# Patient Record
Sex: Male | Born: 1950 | Race: White | Hispanic: No | Marital: Married | Smoking: Former smoker
Health system: Southern US, Community
[De-identification: ages and names within clinical notes are randomized; demographics above are authoritative.]

## PROBLEM LIST (undated history)

## (undated) DIAGNOSIS — J189 Pneumonia, unspecified organism: Secondary | ICD-10-CM

## (undated) DIAGNOSIS — Z9981 Dependence on supplemental oxygen: Secondary | ICD-10-CM

## (undated) DIAGNOSIS — M1990 Unspecified osteoarthritis, unspecified site: Secondary | ICD-10-CM

## (undated) DIAGNOSIS — J45909 Unspecified asthma, uncomplicated: Secondary | ICD-10-CM

## (undated) DIAGNOSIS — B1920 Unspecified viral hepatitis C without hepatic coma: Secondary | ICD-10-CM

## (undated) DIAGNOSIS — E119 Type 2 diabetes mellitus without complications: Secondary | ICD-10-CM

## (undated) DIAGNOSIS — J449 Chronic obstructive pulmonary disease, unspecified: Secondary | ICD-10-CM

## (undated) DIAGNOSIS — F1010 Alcohol abuse, uncomplicated: Secondary | ICD-10-CM

## (undated) DIAGNOSIS — L299 Pruritus, unspecified: Secondary | ICD-10-CM

## (undated) DIAGNOSIS — C801 Malignant (primary) neoplasm, unspecified: Secondary | ICD-10-CM

## (undated) DIAGNOSIS — F32A Depression, unspecified: Secondary | ICD-10-CM

## (undated) DIAGNOSIS — C159 Malignant neoplasm of esophagus, unspecified: Secondary | ICD-10-CM

## (undated) DIAGNOSIS — E039 Hypothyroidism, unspecified: Secondary | ICD-10-CM

## (undated) DIAGNOSIS — R918 Other nonspecific abnormal finding of lung field: Secondary | ICD-10-CM

## (undated) DIAGNOSIS — F329 Major depressive disorder, single episode, unspecified: Secondary | ICD-10-CM

## (undated) HISTORY — DX: Unspecified viral hepatitis C without hepatic coma: B19.20

---

## 1988-03-10 HISTORY — PX: TUMOR EXCISION: SHX421

## 1993-03-10 HISTORY — PX: OTHER SURGICAL HISTORY: SHX169

## 2000-03-24 ENCOUNTER — Emergency Department (HOSPITAL_COMMUNITY): Admission: EM | Admit: 2000-03-24 | Discharge: 2000-03-25 | Payer: Self-pay | Admitting: Emergency Medicine

## 2014-03-10 HISTORY — PX: DENTAL SURGERY: SHX609

## 2014-08-23 ENCOUNTER — Encounter (HOSPITAL_COMMUNITY): Payer: Self-pay | Admitting: Emergency Medicine

## 2014-08-23 ENCOUNTER — Emergency Department (HOSPITAL_COMMUNITY): Payer: Medicaid Other

## 2014-08-23 ENCOUNTER — Observation Stay (HOSPITAL_COMMUNITY)
Admission: EM | Admit: 2014-08-23 | Discharge: 2014-08-24 | Disposition: A | Discharge status: Home / Self Care | Payer: Medicaid Other | Attending: Internal Medicine | Admitting: Internal Medicine

## 2014-08-23 DIAGNOSIS — R7401 Elevation of levels of liver transaminase levels: Secondary | ICD-10-CM | POA: Diagnosis present

## 2014-08-23 DIAGNOSIS — Z7951 Long term (current) use of inhaled steroids: Secondary | ICD-10-CM | POA: Insufficient documentation

## 2014-08-23 DIAGNOSIS — Z87891 Personal history of nicotine dependence: Secondary | ICD-10-CM | POA: Insufficient documentation

## 2014-08-23 DIAGNOSIS — Z7982 Long term (current) use of aspirin: Secondary | ICD-10-CM | POA: Diagnosis not present

## 2014-08-23 DIAGNOSIS — F329 Major depressive disorder, single episode, unspecified: Secondary | ICD-10-CM | POA: Diagnosis present

## 2014-08-23 DIAGNOSIS — J449 Chronic obstructive pulmonary disease, unspecified: Secondary | ICD-10-CM | POA: Diagnosis present

## 2014-08-23 DIAGNOSIS — Z79899 Other long term (current) drug therapy: Secondary | ICD-10-CM | POA: Insufficient documentation

## 2014-08-23 DIAGNOSIS — R079 Chest pain, unspecified: Secondary | ICD-10-CM | POA: Diagnosis not present

## 2014-08-23 DIAGNOSIS — F32A Depression, unspecified: Secondary | ICD-10-CM | POA: Diagnosis present

## 2014-08-23 DIAGNOSIS — R740 Nonspecific elevation of levels of transaminase and lactic acid dehydrogenase [LDH]: Secondary | ICD-10-CM

## 2014-08-23 HISTORY — DX: Alcohol abuse, uncomplicated: F10.10

## 2014-08-23 HISTORY — DX: Major depressive disorder, single episode, unspecified: F32.9

## 2014-08-23 HISTORY — DX: Unspecified osteoarthritis, unspecified site: M19.90

## 2014-08-23 HISTORY — DX: Depression, unspecified: F32.A

## 2014-08-23 HISTORY — DX: Chronic obstructive pulmonary disease, unspecified: J44.9

## 2014-08-23 LAB — CBC WITH DIFFERENTIAL/PLATELET
BASOS ABS: 0 10*3/uL (ref 0.0–0.1)
BASOS PCT: 0 % (ref 0–1)
Eosinophils Absolute: 0.4 10*3/uL (ref 0.0–0.7)
Eosinophils Relative: 6 % — ABNORMAL HIGH (ref 0–5)
HEMATOCRIT: 41.9 % (ref 39.0–52.0)
Hemoglobin: 14.1 g/dL (ref 13.0–17.0)
LYMPHS PCT: 49 % — AB (ref 12–46)
Lymphs Abs: 3.2 10*3/uL (ref 0.7–4.0)
MCH: 31.4 pg (ref 26.0–34.0)
MCHC: 33.7 g/dL (ref 30.0–36.0)
MCV: 93.3 fL (ref 78.0–100.0)
Monocytes Absolute: 0.7 10*3/uL (ref 0.1–1.0)
Monocytes Relative: 11 % (ref 3–12)
NEUTROS ABS: 2.2 10*3/uL (ref 1.7–7.7)
Neutrophils Relative %: 33 % — ABNORMAL LOW (ref 43–77)
Platelets: 177 10*3/uL (ref 150–400)
RBC: 4.49 MIL/uL (ref 4.22–5.81)
RDW: 13.1 % (ref 11.5–15.5)
WBC: 6.5 10*3/uL (ref 4.0–10.5)

## 2014-08-23 LAB — COMPREHENSIVE METABOLIC PANEL
ALK PHOS: 56 U/L (ref 38–126)
ALT: 69 U/L — ABNORMAL HIGH (ref 17–63)
ANION GAP: 6 (ref 5–15)
AST: 62 U/L — ABNORMAL HIGH (ref 15–41)
Albumin: 4.3 g/dL (ref 3.5–5.0)
BILIRUBIN TOTAL: 0.5 mg/dL (ref 0.3–1.2)
BUN: 11 mg/dL (ref 6–20)
CHLORIDE: 103 mmol/L (ref 101–111)
CO2: 29 mmol/L (ref 22–32)
Calcium: 9 mg/dL (ref 8.9–10.3)
Creatinine, Ser: 0.91 mg/dL (ref 0.61–1.24)
GFR calc non Af Amer: >60 mL/min (ref >60)
GLUCOSE: 97 mg/dL (ref 65–99)
POTASSIUM: 4.2 mmol/L (ref 3.5–5.1)
Sodium: 138 mmol/L (ref 135–145)
Total Protein: 7.7 g/dL (ref 6.5–8.1)

## 2014-08-23 LAB — TROPONIN I
Troponin I: <0.03 ng/mL (ref <0.031)
Troponin I: <0.03 ng/mL (ref <0.031)

## 2014-08-23 LAB — URINALYSIS, ROUTINE W REFLEX MICROSCOPIC
BILIRUBIN URINE: NEGATIVE
Glucose, UA: NEGATIVE mg/dL
Hgb urine dipstick: NEGATIVE
KETONES UR: NEGATIVE mg/dL
Leukocytes, UA: NEGATIVE
NITRITE: NEGATIVE
Protein, ur: NEGATIVE mg/dL
SPECIFIC GRAVITY, URINE: 1.02 (ref 1.005–1.030)
UROBILINOGEN UA: 0.2 mg/dL (ref 0.0–1.0)
pH: 5.5 (ref 5.0–8.0)

## 2014-08-23 LAB — ETHANOL

## 2014-08-23 LAB — RAPID URINE DRUG SCREEN, HOSP PERFORMED
Amphetamines: NOT DETECTED
BARBITURATES: NOT DETECTED
Benzodiazepines: POSITIVE — AB
COCAINE: NOT DETECTED
OPIATES: NOT DETECTED
TETRAHYDROCANNABINOL: NOT DETECTED

## 2014-08-23 LAB — ACETAMINOPHEN LEVEL

## 2014-08-23 MED ORDER — HEPARIN SODIUM (PORCINE) 5000 UNIT/ML IJ SOLN
5000.0000 [IU] | Freq: Three times a day (TID) | INTRAMUSCULAR | Status: DC
  Administered 2014-08-23 – 2014-08-24 (×3): 5000 [IU] via SUBCUTANEOUS
  Filled 2014-08-23 (×3): qty 1

## 2014-08-23 MED ORDER — ONDANSETRON HCL 4 MG/2ML IJ SOLN: 4.0000 mg | Freq: Four times a day (QID) | INTRAMUSCULAR | Status: DC | PRN

## 2014-08-23 MED ORDER — ACETAMINOPHEN 325 MG PO TABS: 650.0000 mg | ORAL_TABLET | ORAL | Status: DC | PRN

## 2014-08-23 NOTE — ED Notes (Addendum)
PT c/o sharp central chest pain this morning upon walking with numbness/tingling to left arm. PT also c/o generalized weakness and SOB today. PT reports taking a 325mg  aspirin prior to ED arrival and denies any CP at this time.

## 2014-08-23 NOTE — H&P (Signed)
[facility]          History and Physical    PCP:   No primary care provider on file.   EDP: Brent Franco, EDP  Chief Complaint:  Chest pain  HPI: Patient is a 64 year old man without significant past medical history but also does not have regular medical follow-up who presents to the hospital today with chest pain. He states that early this morning he went to take his trash cans out to the driveway and while walking there are experienced sharp substernal chest pain radiating up into his neck and causing some numbness of his left arm. He didn't think much about this, however when he went to have a shower he felt nauseous and at that moment decided to call 911. Chest pain fully resolved after aspirin given in the emergency department. Has no family history of heart disease and does not a smoker. Initial troponin in the ED is negative and EKG as reviewed by myself shows no acute ischemic abnormalities.  Allergies:  No Known Allergies   History reviewed. No pertinent past medical history.  History reviewed. No pertinent past surgical history.  Prior to Admission medications   Medication Sig Start Date End Date Taking? Authorizing Provider  aspirin 325 MG tablet Take 325 mg by mouth daily.   Yes Historical Provider, MD  mometasone (NASONEX) 50 MCG/ACT nasal spray Place 2 sprays into the nose daily.   Yes Historical Provider, MD  Pseudoeph-Doxylamine-DM-APAP (NYQUIL MULTI-SYMPTOM PO) Take 1 tablet by mouth daily.   Yes Historical Provider, MD    Social History:  reports that he quit smoking about 8 months ago. He does not have any smokeless tobacco history on file. He reports that he does not drink alcohol or use illicit drugs.  History reviewed. No pertinent family history. specifically no family history of heart disease, stroke, diabetes, hypertension, hyperlipidemia  Review of Systems:  Constitutional: Denies fever, chills, diaphoresis, appetite change and  fatigue.  HEENT: Denies photophobia, eye pain, redness, hearing loss, ear pain, congestion, sore throat, rhinorrhea, sneezing, mouth sores, trouble swallowing, neck pain, neck stiffness and tinnitus.   Respiratory: Denies SOB, DOE, cough, chest tightness,  and wheezing.   Cardiovascular: Denies chest pain, palpitations and leg swelling.  Gastrointestinal: Denies nausea, vomiting, abdominal pain, diarrhea, constipation, blood in stool and abdominal distention.  Genitourinary: Denies dysuria, urgency, frequency, hematuria, flank pain and difficulty urinating.  Endocrine: Denies: hot or cold intolerance, sweats, changes in hair or nails, polyuria, polydipsia. Musculoskeletal: Denies myalgias, back pain, joint swelling, arthralgias and gait problem.  Skin: Denies pallor, rash and wound.  Neurological: Denies dizziness, seizures, syncope, weakness, light-headedness, numbness and headaches.  Hematological: Denies adenopathy. Easy bruising, personal or family bleeding history  Psychiatric/Behavioral: Denies suicidal ideation, mood changes, confusion, nervousness, sleep disturbance and agitation   Physical Exam: Blood pressure 144/84, pulse 62, temperature 98.2 F (36.8 C), temperature source Oral, resp. rate 18, height 6' (1.829 m), weight 86.274 kg (190 lb 3.2 oz), SpO2 97 %. General: Alert, awake, oriented 3, no current distress HEENT: Normocephalic, atraumatic, pupils equal round and reactive to light, extraocular movements intact, moist mucous membranes, wears corrective lenses Neck: Supple, no JVD, no lymphadenopathy, no bruits, no goiter. Cardiovascular: Regular rate and rhythm, no murmurs, rubs or gallops. Lungs: Clear to rotation bilaterally. Abdomen: Soft, nontender, nondistended, positive bowel sounds, no masses or organomegaly noted. Consent extremities: No clubbing, cyanosis or edema, positive pulses. Neurologic:  Grossly intact and nonfocal  Labs on Admission:  Results for orders  placed or performed during the hospital encounter of 08/23/14 (from the past 48 hour(s))  CBC with Differential     Status: Abnormal   Collection Time: 08/23/14 10:40 AM  Result Value Ref Range   WBC 6.5 4.0 - 10.5 K/uL   RBC 4.49 4.22 - 5.81 MIL/uL   Hemoglobin 14.1 13.0 - 17.0 g/dL   HCT 41.9 39.0 - 52.0 %   MCV 93.3 78.0 - 100.0 fL   MCH 31.4 26.0 - 34.0 pg   MCHC 33.7 30.0 - 36.0 g/dL   RDW 13.1 11.5 - 15.5 %   Platelets 177 150 - 400 K/uL   Neutrophils Relative % 33 (L) 43 - 77 %   Neutro Abs 2.2 1.7 - 7.7 K/uL   Lymphocytes Relative 49 (H) 12 - 46 %   Lymphs Abs 3.2 0.7 - 4.0 K/uL   Monocytes Relative 11 3 - 12 %   Monocytes Absolute 0.7 0.1 - 1.0 K/uL   Eosinophils Relative 6 (H) 0 - 5 %   Eosinophils Absolute 0.4 0.0 - 0.7 K/uL   Basophils Relative 0 0 - 1 %   Basophils Absolute 0.0 0.0 - 0.1 K/uL  Comprehensive metabolic panel     Status: Abnormal   Collection Time: 08/23/14 10:40 AM  Result Value Ref Range   Sodium 138 135 - 145 mmol/L   Potassium 4.2 3.5 - 5.1 mmol/L   Chloride 103 101 - 111 mmol/L   CO2 29 22 - 32 mmol/L   Glucose, Bld 97 65 - 99 mg/dL   BUN 11 6 - 20 mg/dL   Creatinine, Ser 0.91 0.61 - 1.24 mg/dL   Calcium 9.0 8.9 - 10.3 mg/dL   Total Protein 7.7 6.5 - 8.1 g/dL   Albumin 4.3 3.5 - 5.0 g/dL   AST 62 (H) 15 - 41 U/L   ALT 69 (H) 17 - 63 U/L   Alkaline Phosphatase 56 38 - 126 U/L   Total Bilirubin 0.5 0.3 - 1.2 mg/dL   GFR calc non Af Amer >60 >60 mL/min   GFR calc Af Amer >60 >60 mL/min    Comment: (NOTE) The eGFR has been calculated using the CKD EPI equation. This calculation has not been validated in all clinical situations. eGFR's persistently <60 mL/min signify possible Chronic Kidney Disease.    Anion gap 6 5 - 15  Troponin I     Status: None   Collection Time: 08/23/14 10:40 AM  Result Value Ref Range   Troponin I <0.03 <0.031 ng/mL    Comment:        NO INDICATION OF MYOCARDIAL INJURY.   Ethanol     Status: None    Collection Time: 08/23/14 11:06 AM  Result Value Ref Range   Alcohol, Ethyl (B) <5 <5 mg/dL    Comment:        LOWEST DETECTABLE LIMIT FOR SERUM ALCOHOL IS 5 mg/dL FOR MEDICAL PURPOSES ONLY   Acetaminophen level     Status: Abnormal   Collection Time: 08/23/14 11:06 AM  Result Value Ref Range   Acetaminophen (Tylenol), Serum <10 (L) 10 - 30 ug/mL    Comment:        THERAPEUTIC CONCENTRATIONS VARY SIGNIFICANTLY. A RANGE OF 10-30 ug/mL MAY BE AN EFFECTIVE CONCENTRATION FOR MANY PATIENTS. HOWEVER, SOME ARE BEST TREATED AT CONCENTRATIONS OUTSIDE THIS RANGE. ACETAMINOPHEN CONCENTRATIONS >150 ug/mL AT 4 HOURS AFTER INGESTION AND >50 ug/mL AT  12 HOURS AFTER INGESTION ARE OFTEN ASSOCIATED WITH TOXIC REACTIONS.   Urine rapid drug screen (hosp performed)     Status: Abnormal   Collection Time: 08/23/14  1:20 PM  Result Value Ref Range   Opiates NONE DETECTED NONE DETECTED   Cocaine NONE DETECTED NONE DETECTED   Benzodiazepines POSITIVE (A) NONE DETECTED   Amphetamines NONE DETECTED NONE DETECTED   Tetrahydrocannabinol NONE DETECTED NONE DETECTED   Barbiturates NONE DETECTED NONE DETECTED    Comment:        DRUG SCREEN FOR MEDICAL PURPOSES ONLY.  IF CONFIRMATION IS NEEDED FOR ANY PURPOSE, NOTIFY LAB WITHIN 5 DAYS.        LOWEST DETECTABLE LIMITS FOR URINE DRUG SCREEN Drug Class       Cutoff (ng/mL) Amphetamine      1000 Barbiturate      200 Benzodiazepine   725 Tricyclics       366 Opiates          300 Cocaine          300 THC              50   Urinalysis, Routine w reflex microscopic (not at [facility])     Status: None   Collection Time: 08/23/14  1:21 PM  Result Value Ref Range   Color, Urine YELLOW YELLOW   APPearance CLEAR CLEAR   Specific Gravity, Urine 1.020 1.005 - 1.030   pH 5.5 5.0 - 8.0   Glucose, UA NEGATIVE NEGATIVE mg/dL   Hgb urine dipstick NEGATIVE NEGATIVE   Bilirubin Urine NEGATIVE NEGATIVE   Ketones, ur NEGATIVE NEGATIVE mg/dL   Protein, ur NEGATIVE  NEGATIVE mg/dL   Urobilinogen, UA 0.2 0.0 - 1.0 mg/dL   Nitrite NEGATIVE NEGATIVE   Leukocytes, UA NEGATIVE NEGATIVE    Comment: MICROSCOPIC NOT DONE ON URINES WITH NEGATIVE PROTEIN, BLOOD, LEUKOCYTES, NITRITE, OR GLUCOSE <1000 mg/dL.  Troponin I-serum (0, 3, 6 hours)     Status: None   Collection Time: 08/23/14  5:06 PM  Result Value Ref Range   Troponin I <0.03 <0.031 ng/mL    Comment:        NO INDICATION OF MYOCARDIAL INJURY.     Radiological Exams on Admission: Dg Chest 2 View  08/23/2014   CLINICAL DATA:  Central chest pain this morning upon walking, left arm tingling  EXAM: CHEST  2 VIEW  COMPARISON:  07/06/2009  FINDINGS: Cardiomediastinal silhouette is stable. No acute infiltrate or pleural effusion. No pulmonary edema. Osteopenia and mild degenerative changes thoracic spine.  IMPRESSION: No active cardiopulmonary disease.   Electronically Signed   By: Lahoma Crocker M.D.   On: 08/23/2014 11:11    Assessment/Plan Active Problems:   Chest pain   Chest pain -Heart score of 1 accounting solely for his age. -Given how typical his chest pain is, agree with admission for observation and acute coronary syndrome rule out, we'll cycle troponins and recheck EKG in the morning. -Check fasting lipid panel for further risk stratification. -If rules out, consider scheduling outpatient stress test.  Time Spent on Admission: 55 minutes  [facility] Pager: [phone] 08/23/2014, 6:58 PM

## 2014-08-23 NOTE — ED Provider Notes (Signed)
CSN: [id number]     Arrival date & time 08/23/14  1016 History   First MD Initiated Contact with Patient 08/23/14 1122     Chief Complaint  Patient presents with  . Chest Pain      HPI  Pt was seen at 1130. Per pt, c/o gradual onset and improvement of one episode of chest "pain" that occurred this morning PTA. Pt states he was taking in the trash container when he developed mid-sternal chest "pain" which radiated into his neck and left arm. Was associated with SOB and generalized weakness. Pt took an ASA and rested with improvement of his symptoms. Pt continues improved on arrival to the ED. Denies palpitations, no cough, no abd pain, no N/V/D, no back pain, no injury.    History reviewed. No pertinent past medical history.   History reviewed. No pertinent past surgical history.  History  Substance Use Topics  . Smoking status: Former Smoker    Quit date: 12/08/2013  . Smokeless tobacco: Not on file  . Alcohol Use: No    Review of Systems ROS: Statement: All systems negative except as marked or noted in the HPI; Constitutional: Negative for fever and chills. ; ; Eyes: Negative for eye pain, redness and discharge. ; ; ENMT: Negative for ear pain, hoarseness, nasal congestion, sinus pressure and sore throat. ; ; Cardiovascular: +CP, SOB. Negative for palpitations, diaphoresis, and peripheral edema. ; ; Respiratory: Negative for cough, wheezing and stridor. ; ; Gastrointestinal: Negative for nausea, vomiting, diarrhea, abdominal pain, blood in stool, hematemesis, jaundice and rectal bleeding. . ; ; Genitourinary: Negative for dysuria, flank pain and hematuria. ; ; Musculoskeletal: Negative for back pain and neck pain. Negative for swelling and trauma.; ; Skin: Negative for pruritus, rash, abrasions, blisters, bruising and skin lesion.; ; Neuro: Negative for headache, lightheadedness and neck stiffness. Negative for weakness, altered level of consciousness , altered mental status, extremity  weakness, paresthesias, involuntary movement, seizure and syncope.       Allergies  Review of patient's allergies indicates no known allergies.  Home Medications   Prior to Admission medications   Medication Sig Start Date End Date Taking? Authorizing Provider  aspirin 325 MG tablet Take 325 mg by mouth daily.   Yes Historical Provider, MD  mometasone (NASONEX) 50 MCG/ACT nasal spray Place 2 sprays into the nose daily.   Yes Historical Provider, MD  Pseudoeph-Doxylamine-DM-APAP (NYQUIL MULTI-SYMPTOM PO) Take 1 tablet by mouth daily.   Yes Historical Provider, MD   BP 135/79 mmHg  Pulse 61  Temp(Src) 98 F (36.7 C) (Oral)  Resp 18  SpO2 95% Physical Exam  1135: Physical examination:  Nursing notes reviewed; Vital signs and O2 SAT reviewed;  Constitutional: Well developed, Well nourished, Well hydrated, In no acute distress; Head:  Normocephalic, atraumatic; Eyes: EOMI, PERRL, No scleral icterus; ENMT: Mouth and pharynx normal, Mucous membranes moist; Neck: Supple, Full range of motion, No lymphadenopathy; Cardiovascular: Regular rate and rhythm, No gallop; Respiratory: Breath sounds clear & equal bilaterally, No wheezes.  Speaking full sentences with ease, Normal respiratory effort/excursion; Chest: Nontender, Movement normal; Abdomen: Soft, Nontender, Nondistended, Normal bowel sounds; Genitourinary: No CVA tenderness; Extremities: Pulses normal, No tenderness, No edema, No calf edema or asymmetry.; Neuro: AA&Ox3, Major CN grossly intact. No facial droop. Speech clear. No gross focal motor or sensory deficits in extremities.; Skin: Color normal, Warm, Dry.; Psych:  Affect flat, poor eye contact.    ED Course  Procedures     EKG Interpretation  Date/Time:  Wednesday August 23 2014 10:34:40 EDT Ventricular Rate:  74 PR Interval:  146 QRS Duration: 85 QT Interval:  401 QTC Calculation: 445 R Axis:   85 Text Interpretation:  Sinus rhythm Consider left atrial enlargement   Borderline right axis deviation Baseline wander Confirmed by [facility]  MD,  Nunzio Cory [phone]) on 08/23/2014 1:15:07 PM      MDM  MDM Reviewed: nursing note and vitals Interpretation: labs, ECG and x-ray      Results for orders placed or performed during the hospital encounter of 08/23/14  CBC with Differential  Result Value Ref Range   WBC 6.5 4.0 - 10.5 K/uL   RBC 4.49 4.22 - 5.81 MIL/uL   Hemoglobin 14.1 13.0 - 17.0 g/dL   HCT 41.9 39.0 - 52.0 %   MCV 93.3 78.0 - 100.0 fL   MCH 31.4 26.0 - 34.0 pg   MCHC 33.7 30.0 - 36.0 g/dL   RDW 13.1 11.5 - 15.5 %   Platelets 177 150 - 400 K/uL   Neutrophils Relative % 33 (L) 43 - 77 %   Neutro Abs 2.2 1.7 - 7.7 K/uL   Lymphocytes Relative 49 (H) 12 - 46 %   Lymphs Abs 3.2 0.7 - 4.0 K/uL   Monocytes Relative 11 3 - 12 %   Monocytes Absolute 0.7 0.1 - 1.0 K/uL   Eosinophils Relative 6 (H) 0 - 5 %   Eosinophils Absolute 0.4 0.0 - 0.7 K/uL   Basophils Relative 0 0 - 1 %   Basophils Absolute 0.0 0.0 - 0.1 K/uL  Comprehensive metabolic panel  Result Value Ref Range   Sodium 138 135 - 145 mmol/L   Potassium 4.2 3.5 - 5.1 mmol/L   Chloride 103 101 - 111 mmol/L   CO2 29 22 - 32 mmol/L   Glucose, Bld 97 65 - 99 mg/dL   BUN 11 6 - 20 mg/dL   Creatinine, Ser 0.91 0.61 - 1.24 mg/dL   Calcium 9.0 8.9 - 10.3 mg/dL   Total Protein 7.7 6.5 - 8.1 g/dL   Albumin 4.3 3.5 - 5.0 g/dL   AST 62 (H) 15 - 41 U/L   ALT 69 (H) 17 - 63 U/L   Alkaline Phosphatase 56 38 - 126 U/L   Total Bilirubin 0.5 0.3 - 1.2 mg/dL   GFR calc non Af Amer >60 >60 mL/min   GFR calc Af Amer >60 >60 mL/min   Anion gap 6 5 - 15  Troponin I  Result Value Ref Range   Troponin I <0.03 <0.031 ng/mL   Dg Chest 2 View 08/23/2014   CLINICAL DATA:  Central chest pain this morning upon walking, left arm tingling  EXAM: CHEST  2 VIEW  COMPARISON:  07/06/2009  FINDINGS: Cardiomediastinal silhouette is stable. No acute infiltrate or pleural effusion. No pulmonary edema. Osteopenia  and mild degenerative changes thoracic spine.  IMPRESSION: No active cardiopulmonary disease.   Electronically Signed   By: Lahoma Crocker M.D.   On: 08/23/2014 11:11    1235:  LFT's elevated, abd soft/NT. Will check etoh and APAP levels. Concerning HPI and pt does not have PMD; will observation admit. Dx and testing d/w pt and family.  Questions answered.  Verb understanding, agreeable to d/c home with outpt f/u. T/C to [facility]. Jerilee Hoh, case discussed, including:  HPI, pertinent PM/SHx, VS/PE, dx testing, ED course and treatment:  Agreeable to admit, requests to write temporary orders, obtain observation tele bed to team 2.  Francine Graven, DO 08/24/14 [phone]

## 2014-08-24 ENCOUNTER — Encounter (HOSPITAL_COMMUNITY): Payer: Self-pay | Admitting: Internal Medicine

## 2014-08-24 DIAGNOSIS — R7401 Elevation of levels of liver transaminase levels: Secondary | ICD-10-CM | POA: Diagnosis present

## 2014-08-24 DIAGNOSIS — Z7951 Long term (current) use of inhaled steroids: Secondary | ICD-10-CM | POA: Diagnosis not present

## 2014-08-24 DIAGNOSIS — F329 Major depressive disorder, single episode, unspecified: Secondary | ICD-10-CM | POA: Diagnosis present

## 2014-08-24 DIAGNOSIS — Z79899 Other long term (current) drug therapy: Secondary | ICD-10-CM | POA: Diagnosis not present

## 2014-08-24 DIAGNOSIS — R079 Chest pain, unspecified: Secondary | ICD-10-CM | POA: Diagnosis not present

## 2014-08-24 DIAGNOSIS — R740 Nonspecific elevation of levels of transaminase and lactic acid dehydrogenase [LDH]: Secondary | ICD-10-CM

## 2014-08-24 DIAGNOSIS — F32A Depression, unspecified: Secondary | ICD-10-CM | POA: Diagnosis present

## 2014-08-24 DIAGNOSIS — Z7982 Long term (current) use of aspirin: Secondary | ICD-10-CM | POA: Diagnosis not present

## 2014-08-24 DIAGNOSIS — J449 Chronic obstructive pulmonary disease, unspecified: Secondary | ICD-10-CM | POA: Diagnosis present

## 2014-08-24 LAB — LIPID PANEL
Cholesterol: 137 mg/dL (ref 0–200)
HDL: 25 mg/dL — ABNORMAL LOW (ref >40)
LDL Cholesterol: 87 mg/dL (ref 0–99)
Total CHOL/HDL Ratio: 5.5 RATIO
Triglycerides: 127 mg/dL (ref <150)
VLDL: 25 mg/dL (ref 0–40)

## 2014-08-24 NOTE — Discharge Summary (Signed)
Physician Discharge Summary  Brent Franco HTD:[id number] DOB: 05/24/1950 DOA: 08/23/2014  PCP: No primary care provider on file.  Admit date: 08/23/2014 Discharge date: 08/24/2014  Time spent: 40 minutes  Recommendations for Outpatient Follow-up:  1. Follow up with [facility] 08/30/14 for stress test 2. [facility] establish with PCP to follow hep C, liver disease, risk factors, copd  Discharge Diagnoses:  Active Problems:   Chest pain   Elevated transaminase level   COPD (chronic obstructive pulmonary disease)   Depression   Discharge Condition: stable  Diet recommendation: heart healthy  Filed Weights   08/23/14 1426 08/23/14 1508  Weight: 86.274 kg (190 lb 3.2 oz) 86.274 kg (190 lb 3.2 oz)    History of present illness:  Patient is a 64 year old man without significant past medical history but also does not have regular medical follow-up who presented to the hospital 08/23/14 with chest pain. He stated that early that morning he went to take his trash cans out to the driveway and while walking there are experienced sharp substernal chest pain radiating up into his neck and causing some numbness of his left arm. He didn't think much about this, however when he went to have a shower he felt nauseous and at that moment decided to call 911. Chest pain fully resolved after aspirin given in the emergency department. Has no family history of heart disease and does not a smoke. Initial troponin in the ED is negative and EKG as reviewed by myself shows no acute ischemic abnormalities  Hospital Course:   Chest pain. Somewhat typical. Heart score of 1 accounting solely for his age. Given how typical his chest pain was he was admitted for rule out which he did. Lipid panel unremarkable except HDL 25. Was provided with ASA and statin.  No further chest pain. No events on tele. Will follow up with cardiology 08/30/14 for stress test.   Elevated transaminases: mild. Hx Hep C. No PCP. Was  followed [facility] in past. Follow [facility] to establish PCP  COPD: stable at baseline. Former smoker. Follow up with PCP  Procedures:  none  Consultations:  none  Discharge Exam: Filed Vitals:   08/24/14 1000  BP: 139/99  Pulse: 67  Temp: 97.7 F (36.5 C)  Resp: 18    General: well nourished appears comfortable Cardiovascular: RRR no MGR No LE edema Respiratory: normal effort BS slightly coarse with fair air flow. No wheeze  Discharge Instructions   Discharge Instructions    Diet - low sodium heart healthy    Complete by:  As directed      Increase activity slowly    Complete by:  As directed           Discharge Medication List as of 08/24/2014 11:30 AM    CONTINUE these medications which have NOT CHANGED   Details  aspirin 325 MG tablet Take 325 mg by mouth daily., Until Discontinued, Historical Med    mometasone (NASONEX) 50 MCG/ACT nasal spray Place 2 sprays into the nose daily., Until Discontinued, Historical Med    Pseudoeph-Doxylamine-DM-APAP (NYQUIL MULTI-SYMPTOM PO) Take 1 tablet by mouth daily., Until Discontinued, Historical Med       No Known Allergies Follow-up Information    Follow up with Herminio Commons, MD On 08/30/2014.   Specialty:  Cardiology   Why:  go to radiology at 8am for stress test.    Contact information:   [place] [address] [phone]  Follow up with Alphia Kava On 09/04/2014.   Why:  at 2:45   Contact information:   [facility] [place] [address] [phone]        The results of significant diagnostics from this hospitalization (including imaging, microbiology, ancillary and laboratory) are listed below for reference.    Significant Diagnostic Studies: Dg Chest 2 View  08/23/2014   CLINICAL DATA:  Central chest pain this morning upon walking, left arm tingling  EXAM: CHEST  2 VIEW  COMPARISON:  07/06/2009  FINDINGS: Cardiomediastinal silhouette is  stable. No acute infiltrate or pleural effusion. No pulmonary edema. Osteopenia and mild degenerative changes thoracic spine.  IMPRESSION: No active cardiopulmonary disease.   Electronically Signed   By: Lahoma Crocker M.D.   On: 08/23/2014 11:11    Microbiology: No results found for this or any previous visit (from the past 240 hour(s)).   Labs: Basic Metabolic Panel:  Recent Labs Lab 08/23/14 1040  NA 138  K 4.2  CL 103  CO2 29  GLUCOSE 97  BUN 11  CREATININE 0.91  CALCIUM 9.0   Liver Function Tests:  Recent Labs Lab 08/23/14 1040  AST 62*  ALT 69*  ALKPHOS 56  BILITOT 0.5  PROT 7.7  ALBUMIN 4.3   No results for input(s): LIPASE, AMYLASE in the last 168 hours. No results for input(s): AMMONIA in the last 168 hours. CBC:  Recent Labs Lab 08/23/14 1040  WBC 6.5  NEUTROABS 2.2  HGB 14.1  HCT 41.9  MCV 93.3  PLT 177   Cardiac Enzymes:  Recent Labs Lab 08/23/14 1040 08/23/14 1706 08/23/14 1941 08/23/14 2226  TROPONINI <0.03 <0.03 <0.03 <0.03   BNP: BNP (last 3 results) No results for input(s): BNP in the last 8760 hours.  ProBNP (last 3 results) No results for input(s): PROBNP in the last 8760 hours.  CBG: No results for input(s): GLUCAP in the last 168 hours.     SignedRadene Gunning  [facility] 08/24/2014, 1:18 PM

## 2014-08-24 NOTE — Care Management Note (Signed)
Case Management Note  Patient Details  Name: Brent Franco MRN: [id number] Date of Birth: 1950/12/25  Subjective/Objective:                  Pt admitted from home with CP. Pt lives with his wife and will return home at discharge. Pt is independent with ADL's.  Action/Plan: Pt discharged home today. Pt PCP follow up arranged with [facility] and pt aware of appt time.  Expected Discharge Date:                  Expected Discharge Plan:  Home/Self Care  In-House Referral:  NA  Discharge planning Services  CM Consult  Post Acute Care Choice:  NA Choice offered to:  NA  DME Arranged:    DME Agency:     HH Arranged:    HH Agency:     Status of Service:  Completed, signed off  Medicare Important Message Given:    Date Medicare IM Given:    Medicare IM give by:    Date Additional Medicare IM Given:    Additional Medicare Important Message give by:     If discussed at [place] of Stay Meetings, dates discussed:    Additional Comments:  Joylene Draft, RN 08/24/2014, 10:45 AM

## 2014-08-24 NOTE — Progress Notes (Signed)
Discharge instructions given, verbalized understanding, out in stable condition ambulatory with staff. 

## 2014-08-30 ENCOUNTER — Encounter (HOSPITAL_COMMUNITY): Payer: Medicaid Other

## 2014-09-08 ENCOUNTER — Other Ambulatory Visit (HOSPITAL_COMMUNITY): Payer: Self-pay | Admitting: Internal Medicine

## 2014-09-08 DIAGNOSIS — R079 Chest pain, unspecified: Secondary | ICD-10-CM

## 2014-09-12 ENCOUNTER — Encounter (HOSPITAL_COMMUNITY)
Admission: RE | Admit: 2014-09-12 | Discharge: 2014-09-12 | Disposition: A | Discharge status: Home / Self Care | Payer: Medicaid Other | Source: Ambulatory Visit | Attending: Internal Medicine | Admitting: Internal Medicine

## 2014-09-12 ENCOUNTER — Encounter (HOSPITAL_COMMUNITY): Payer: Self-pay

## 2014-09-12 ENCOUNTER — Inpatient Hospital Stay (HOSPITAL_COMMUNITY): Admission: RE | Admit: 2014-09-12 | Payer: Medicaid Other | Source: Ambulatory Visit

## 2014-09-12 DIAGNOSIS — R079 Chest pain, unspecified: Secondary | ICD-10-CM | POA: Diagnosis not present

## 2014-09-12 HISTORY — DX: Unspecified asthma, uncomplicated: J45.909

## 2014-09-12 LAB — NM MYOCAR MULTI W/SPECT W/WALL MOTION / EF
CHL CUP NUCLEAR SDS: 0
CHL CUP RESTING HR STRESS: 51 {beats}/min
CSEPED: 6 min
CSEPEDS: 0 s
CSEPPHR: 139 {beats}/min
Estimated workload: 7 METS
LHR: 0.31
LV sys vol: 23 mL
LVDIAVOL: 74 mL
MPHR: 158 {beats}/min
RPE: 13
SRS: 1
SSS: 1
TID: 0.92

## 2014-09-12 MED ORDER — TECHNETIUM TC 99M SESTAMIBI - CARDIOLITE
10.0000 | Freq: Once | INTRAVENOUS | Status: AC | PRN
  Administered 2014-09-12: 08:00:00 9.2 via INTRAVENOUS

## 2014-09-12 MED ORDER — REGADENOSON 0.4 MG/5ML IV SOLN
INTRAVENOUS | Status: AC
  Filled 2014-09-12: qty 5

## 2014-09-12 MED ORDER — TECHNETIUM TC 99M SESTAMIBI GENERIC - CARDIOLITE
30.0000 | Freq: Once | INTRAVENOUS | Status: AC | PRN
  Administered 2014-09-12: 28 via INTRAVENOUS

## 2014-09-12 MED ORDER — SODIUM CHLORIDE 0.9 % IJ SOLN
INTRAMUSCULAR | Status: AC
  Administered 2014-09-12: 10 mL via INTRAVENOUS
  Filled 2014-09-12: qty 3

## 2014-09-21 ENCOUNTER — Ambulatory Visit (INDEPENDENT_AMBULATORY_CARE_PROVIDER_SITE_OTHER): Payer: Medicaid Other | Admitting: Cardiovascular Disease

## 2014-09-21 ENCOUNTER — Encounter: Payer: Self-pay | Admitting: Cardiovascular Disease

## 2014-09-21 VITALS — BP 108/72 | HR 60 | Ht 72.0 in | Wt 193.0 lb

## 2014-09-21 DIAGNOSIS — Z9289 Personal history of other medical treatment: Secondary | ICD-10-CM

## 2014-09-21 DIAGNOSIS — R079 Chest pain, unspecified: Secondary | ICD-10-CM

## 2014-09-21 DIAGNOSIS — Z87898 Personal history of other specified conditions: Secondary | ICD-10-CM

## 2014-09-21 DIAGNOSIS — R7401 Elevation of levels of liver transaminase levels: Secondary | ICD-10-CM

## 2014-09-21 DIAGNOSIS — R740 Nonspecific elevation of levels of transaminase and lactic acid dehydrogenase [LDH]: Secondary | ICD-10-CM

## 2014-09-21 DIAGNOSIS — F17201 Nicotine dependence, unspecified, in remission: Secondary | ICD-10-CM | POA: Diagnosis not present

## 2014-09-21 MED ORDER — ASPIRIN EC 81 MG PO TBEC: 81.0000 mg | DELAYED_RELEASE_TABLET | Freq: Every day | ORAL | Status: DC

## 2014-09-21 MED ORDER — NITROGLYCERIN 0.4 MG SL SUBL: 0.4000 mg | SUBLINGUAL_TABLET | SUBLINGUAL | Status: AC | PRN

## 2014-09-21 NOTE — Progress Notes (Signed)
Patient ID: LADANIAN KELTER, male   DOB: [birth year], 64 y.o.   MRN: [id number]       CARDIOLOGY CONSULT NOTE  Patient ID: THALES KNIPPLE MRN: [id number] DOB/AGE: 10-09-1950 64 y.o.  Admit date: (Not on file) Primary Physician [place]  Reason for Consultation: chest pain  HPI: The patient is a 64 year old male with a history of COPD and hepatitis C who was hospitalized in June for chest pain. He ruled out for an acute coronary syndrome with normal troponins. Chest x-ray on 6/15 was normal. ECG on 6/16 demonstrated normal sinus rhythm with no ischemic ST segment or T-wave abnormalities.  He ultimately underwent nuclear stress testing on 09/12/14 which was deemed a low risk study. Calculated LVEF 69%. There is a defect seen in the basal inferoseptal, basal inferior, mid inferoseptal, and mid inferior location. It appeared to be consistent with diaphragmatic soft tissue attenuation artifact as regional wall motion was normal. I personally interpreted the study.  He is now taking aspirin 325 mg daily. He has had no symptom recurrence. He also denies shortness of breath, orthopnea, palpitations, syncope, and leg swelling.   This initial episode of chest pain occurred while he was taking the trash out. He experienced severe left precordial chest pain radiating into the left arm. He has a long history of tobacco abuse but quit smoking in October 2015.   Review of additional blood test showed lipid panel on 08/24/14 which showed total cholesterol 137, triglycerides 127, HDL 25 which is low, LDL 87. On 6/15, transaminases were mildly elevated with AST 62 and ALT 69.  Soc: Married. 2 adult daughters. Quit smoking in October 2015.  Allergies  Allergen Reactions  . Demerol [Meperidine] Rash    Current Outpatient Prescriptions  Medication Sig Dispense Refill  . albuterol (PROVENTIL HFA;VENTOLIN HFA) 108 (90 BASE) MCG/ACT inhaler Inhale 1 puff into the lungs every 6 (six)  hours as needed for wheezing or shortness of breath.    Marland Kitchen aspirin 325 MG tablet Take 325 mg by mouth daily.    Marland Kitchen escitalopram (LEXAPRO) 10 MG tablet Take 10 mg by mouth daily.    . mometasone (NASONEX) 50 MCG/ACT nasal spray Place 2 sprays into the nose daily.    . Pseudoeph-Doxylamine-DM-APAP (NYQUIL MULTI-SYMPTOM PO) Take 1 tablet by mouth daily.     No current facility-administered medications for this visit.    Past Medical History  Diagnosis Date  . COPD (chronic obstructive pulmonary disease)   . Hepatitis   . ETOH abuse     stopped 10 years ago  . Depression     suicide attempt 20 years ago  . Arthritis   . Asthma     Past Surgical History  Procedure Laterality Date  . Other surgical history Left 1995    fatty tissue tumor right upper thigh  . Dental surgery N/A January 2016    "dentures and spurs"     History   Social History  . Marital Status: Married    Spouse Name: N/A  . Number of Children: N/A  . Years of Education: N/A   Occupational History  . Not on file.   Social History Main Topics  . Smoking status: Former Smoker    Quit date: 12/08/2013  . Smokeless tobacco: Never Used  . Alcohol Use: No  . Drug Use: No  . Sexual Activity: Yes   Other Topics Concern  . Not on file   Social History Narrative  No family history of premature CAD in 1st degree relatives.  Prior to Admission medications   Medication Sig Start Date End Date Taking? Authorizing Provider  albuterol (PROVENTIL HFA;VENTOLIN HFA) 108 (90 BASE) MCG/ACT inhaler Inhale 1 puff into the lungs every 6 (six) hours as needed for wheezing or shortness of breath.   Yes Historical Provider, MD  aspirin 325 MG tablet Take 325 mg by mouth daily.   Yes Historical Provider, MD  escitalopram (LEXAPRO) 10 MG tablet Take 10 mg by mouth daily.   Yes Historical Provider, MD  mometasone (NASONEX) 50 MCG/ACT nasal spray Place 2 sprays into the nose daily.   Yes Historical Provider, MD    Pseudoeph-Doxylamine-DM-APAP (NYQUIL MULTI-SYMPTOM PO) Take 1 tablet by mouth daily.   Yes Historical Provider, MD     Review of systems complete and found to be negative unless listed above in HPI     Physical exam Blood pressure 108/72, pulse 60, height 6' (1.829 m), weight 193 lb (87.544 kg), SpO2 94 %. General: NAD Neck: No JVD, no thyromegaly or thyroid nodule.  Lungs: Clear to auscultation bilaterally with normal respiratory effort. CV: Nondisplaced PMI. Regular rate and rhythm, normal S1/S2, no S3/S4, no murmur.  No peripheral edema.  No carotid bruit.  Diminished pedal pulses.  Abdomen: Soft, nontender, no distention.  Skin: Intact without lesions or rashes.  Neurologic: Alert and oriented x 3.  Psych: Normal affect. Extremities: No clubbing or cyanosis.  HEENT: Normal.   ECG: Most recent ECG reviewed.  Labs:   Lab Results  Component Value Date   WBC 6.5 08/23/2014   HGB 14.1 08/23/2014   HCT 41.9 08/23/2014   MCV 93.3 08/23/2014   PLT 177 08/23/2014   No results for input(s): NA, K, CL, CO2, BUN, CREATININE, CALCIUM, PROT, BILITOT, ALKPHOS, ALT, AST, GLUCOSE in the last 168 hours.  Invalid input(s): LABALBU Lab Results  Component Value Date   TROPONINI <0.03 08/23/2014    Lab Results  Component Value Date   CHOL 137 08/24/2014   Lab Results  Component Value Date   HDL 25* 08/24/2014   Lab Results  Component Value Date   LDLCALC 87 08/24/2014   Lab Results  Component Value Date   TRIG 127 08/24/2014   Lab Results  Component Value Date   CHOLHDL 5.5 08/24/2014   No results found for: LDLDIRECT       Studies: No results found.  ASSESSMENT AND PLAN:  1. Chest pain: Nuclear stress test without gross ischemic abnormalities and normal left ventricular systolic function and regional wall motion. Will reduce aspirin to 81 mg daily and prescribe sublingual nitroglycerin as needed. Primary risk factor for heart disease is long history of tobacco  abuse.  Dispo: f/u 3 months.   Signed: Kate Sable, M.D., F.A.C.C.  09/21/2014, 2:37 PM

## 2014-09-21 NOTE — Patient Instructions (Signed)
   Decrease Aspirin to 81mg  daily  Begin Nitroglycerin as needed for severe chest pain only - new sent to pharmacy today. Continue all other medications.   Follow up in  3 months.

## 2014-12-20 ENCOUNTER — Encounter: Payer: Self-pay | Admitting: Cardiovascular Disease

## 2014-12-20 ENCOUNTER — Ambulatory Visit (INDEPENDENT_AMBULATORY_CARE_PROVIDER_SITE_OTHER): Payer: Medicaid Other | Admitting: Cardiovascular Disease

## 2014-12-20 VITALS — BP 113/77 | HR 73 | Ht 72.0 in | Wt 194.0 lb

## 2014-12-20 DIAGNOSIS — R079 Chest pain, unspecified: Secondary | ICD-10-CM

## 2014-12-20 NOTE — Patient Instructions (Signed)
Continue all current medications. Follow up as needed  

## 2014-12-20 NOTE — Progress Notes (Signed)
Patient ID: Brent Franco, male   DOB: 08/08/1950, 64 y.o.   MRN: [id number]      SUBJECTIVE: The patient presents for follow-up of chest pain.  In summary, he has a history of COPD and hepatitis C and was hospitalized in June 2016 for chest pain. He ruled out for an acute coronary syndrome with normal troponins. Chest x-ray on 6/15 was normal. ECG on 6/16 demonstrated normal sinus rhythm with no ischemic ST segment or T-wave abnormalities.  He ultimately underwent nuclear stress testing on 09/12/14 which was deemed a low risk study. Calculated LVEF 69%. There was a defect seen in the basal inferoseptal, basal inferior, mid inferoseptal, and mid inferior location. It appeared to be consistent with diaphragmatic soft tissue attenuation artifact as regional wall motion was normal. I personally interpreted the study.  He has had no symptom recurrence. He also denies shortness of breath, orthopnea, palpitations, syncope, and leg swelling.   This initial episode of chest pain occurred while he was taking the trash out. He experienced severe left precordial chest pain radiating into the left arm. He has a long history of tobacco abuse but quit smoking in October 2015.   He has had some mild confusion lately and was told his liver enzymes were elevated, and is going to see his PCP. He does not drink alcohol.  Review of Systems: As per "subjective", otherwise negative.  Allergies  Allergen Reactions  . Demerol [Meperidine] Rash    Current Outpatient Prescriptions  Medication Sig Dispense Refill  . albuterol (PROVENTIL HFA;VENTOLIN HFA) 108 (90 BASE) MCG/ACT inhaler Inhale 1 puff into the lungs every 6 (six) hours as needed for wheezing or shortness of breath.    Marland Kitchen aspirin EC 81 MG tablet Take 1 tablet (81 mg total) by mouth daily.    Marland Kitchen FLUoxetine (PROZAC) 20 MG capsule Take 20 mg by mouth daily.  2  . ketoconazole (NIZORAL) 2 % cream Apply 1 application topically 2 (two) times daily.  6  .  levothyroxine (SYNTHROID, LEVOTHROID) 25 MCG tablet Take 25 mcg by mouth daily.  5  . mometasone (NASONEX) 50 MCG/ACT nasal spray Place 2 sprays into the nose daily.    . nitroGLYCERIN (NITROSTAT) 0.4 MG SL tablet Place 1 tablet (0.4 mg total) under the tongue every 5 (five) minutes as needed for chest pain. 25 tablet 3  . Pseudoeph-Doxylamine-DM-APAP (NYQUIL MULTI-SYMPTOM PO) Take 1 tablet by mouth daily as needed.     Marland Kitchen SPIRIVA HANDIHALER 18 MCG inhalation capsule Place 1 puff into inhaler and inhale daily.  5   No current facility-administered medications for this visit.    Past Medical History  Diagnosis Date  . COPD (chronic obstructive pulmonary disease) ([place])   . Hepatitis   . ETOH abuse     stopped 10 years ago  . Depression     suicide attempt 20 years ago  . Arthritis   . Asthma     Past Surgical History  Procedure Laterality Date  . Other surgical history Left 1995    fatty tissue tumor right upper thigh  . Dental surgery N/A January 2016    "dentures and spurs"     Social History   Social History  . Marital Status: Married    Spouse Name: N/A  . Number of Children: N/A  . Years of Education: N/A   Occupational History  . Not on file.   Social History Main Topics  . Smoking status: Former Smoker -- 1.50 packs/day for  59 years    Types: Cigarettes    Start date: 12/03/1966    Quit date: 12/08/2013  . Smokeless tobacco: Never Used  . Alcohol Use: No  . Drug Use: No  . Sexual Activity: Yes   Other Topics Concern  . Not on file   Social History Narrative     Filed Vitals:   12/20/14 1123  BP: 113/77  Pulse: 73  Height: 6' (1.829 m)  Weight: 194 lb (87.998 kg)    PHYSICAL EXAM General: NAD HEENT: Normal. Neck: No JVD, no thyromegaly. Lungs: Clear to auscultation bilaterally with normal respiratory effort. CV: Nondisplaced PMI.  Regular rate and rhythm, normal S1/S2, no S3/S4, no murmur. No pretibial or periankle edema.   Abdomen: Soft, no  distention.  Neurologic: Alert and oriented x 3.  Psych: Normal affect. Skin: Normal. Musculoskeletal: Normal range of motion, no gross deformities. Extremities: No clubbing or cyanosis.   ECG: Most recent ECG reviewed.      ASSESSMENT AND PLAN: 1. Chest pain: No recurrences. Nuclear stress test without gross ischemic abnormalities and normal left ventricular systolic function and regional wall motion. Continue aspirin 81 mg daily and sublingual nitroglycerin as needed. Primary risk factor for heart disease is long history of tobacco abuse. Encouraged to call me should symptoms recur.  Dispo: f/u prn.  Kate Sable, M.D., F.A.C.C.

## 2015-01-01 ENCOUNTER — Encounter: Payer: Self-pay | Admitting: Internal Medicine

## 2015-01-18 ENCOUNTER — Ambulatory Visit (INDEPENDENT_AMBULATORY_CARE_PROVIDER_SITE_OTHER): Payer: Medicaid Other | Admitting: Nurse Practitioner

## 2015-01-18 ENCOUNTER — Encounter: Payer: Self-pay | Admitting: Nurse Practitioner

## 2015-01-18 VITALS — BP 131/80 | HR 74 | Temp 97.0°F | Ht 72.0 in | Wt 192.2 lb

## 2015-01-18 DIAGNOSIS — Z8619 Personal history of other infectious and parasitic diseases: Secondary | ICD-10-CM | POA: Insufficient documentation

## 2015-01-18 NOTE — Assessment & Plan Note (Addendum)
64 year old male who states he was diagnosed with hepatitis C about 25 years ago and treatment was deferred with interferon after discussion with his provider. Is been referred by PCP given new treatment options. No current labs to confirm hep C status. He does have 2 high-risk factors including birth cohort and history of IV drug use. No longer use IV drugs, no longer drinks. Feels the stomach is gotten bigger although he has been eating more since quitting smoking. Did complain of a single bout of confusion that lasted a couple days after taking an antifungal per his podiatrist when his liver enzymes were "really really high." Today we will begin workup for possible hep C treatment as well as to evaluate him for any hepatic structural changes. I will order a CBC, CMP, PTT/INR, hepatitis C antibody with reflex to RNA, and hepatitis C genotype if he is hep C positive. We'll also order an ultrasound elastography to evaluate his liver. We'll have him return for follow-up in 6 weeks to discuss treatment options based on the results of his tests.

## 2015-01-18 NOTE — Progress Notes (Signed)
Primary Care Physician:  Wendie Simmer, MD Primary Gastroenterologist:  Dr. Gala Romney  Chief Complaint  Patient presents with  . Hepatitis C    HPI:   64 year old male presents on follow-up from PCP for positive hepatitis C. PCP notes reviewed. No labs included with the office visit note. No hepatitis C labs found in [facility]. Per PCP notes last colonoscopy 10 years ago at [facility], although no procedure note be found in [facility].  Today he states he was diagnosed about 25-30 years ago. He was taking Lamasil from the podiatrist and LFTs were "sky high" and was told to stop taking it. He became confused at that time as well. When he was diagnosed 25-30 years ago, they decided not to treat with interferon. Occasional abdominal pain. Wife thinks her abdomen has gotten bigger, but states he's eating a lot more since quitting smoking. He states he turned yellow when he was 20-20. No recent jaundice. Denies LE edema. Denies any other confusion episodes besides the one. Occasional brbpr which he feels happens when he has had heavy lifting. States "sometimes once a day and sometimes 2-3 days in a row." Last colonoscopy over 10 years ago when he was told he had bleeding hemorrhoids. This was done at [facility]. Blood is in the commode and on the toilet tissue. Also with clear rectal drainage at random times. Denies chest pain, dyspnea, dizziness, lightheadedness, syncope, near syncope. Denies any other upper or lower GI symptoms.   Hepatitis C Risk Factors:  Birth cohort ([place]): Yes IV drug use: Yes Tattoos: No Blood product transfusion: No HC worker: No Hemodialysis: No Maternal infection:    Past Medical History  Diagnosis Date  . COPD (chronic obstructive pulmonary disease) ([place])   . Hepatitis   . ETOH abuse     stopped 10 years ago  . Depression     suicide attempt 20 years ago  . Arthritis   . Asthma     Past Surgical History  Procedure Laterality Date  . Other surgical history  Left 1995    fatty tissue tumor right upper thigh  . Dental surgery N/A January 2016    "dentures and spurs"     Current Outpatient Prescriptions  Medication Sig Dispense Refill  . albuterol (PROVENTIL HFA;VENTOLIN HFA) 108 (90 BASE) MCG/ACT inhaler Inhale 1 puff into the lungs every 6 (six) hours as needed for wheezing or shortness of breath.    Marland Kitchen FLUoxetine (PROZAC) 20 MG capsule Take 20 mg by mouth daily.  2  . ketoconazole (NIZORAL) 2 % cream Apply 1 application topically 2 (two) times daily.  6  . levothyroxine (SYNTHROID, LEVOTHROID) 25 MCG tablet Take 25 mcg by mouth daily.  5  . mometasone (NASONEX) 50 MCG/ACT nasal spray Place 2 sprays into the nose daily.    . Pseudoeph-Doxylamine-DM-APAP (NYQUIL MULTI-SYMPTOM PO) Take 1 tablet by mouth daily as needed.     Marland Kitchen SPIRIVA HANDIHALER 18 MCG inhalation capsule Place 1 puff into inhaler and inhale daily.  5  . aspirin EC 81 MG tablet Take 1 tablet (81 mg total) by mouth daily. (Patient not taking: Reported on 01/18/2015)    . nitroGLYCERIN (NITROSTAT) 0.4 MG SL tablet Place 1 tablet (0.4 mg total) under the tongue every 5 (five) minutes as needed for chest pain. (Patient not taking: Reported on 01/18/2015) 25 tablet 3   No current facility-administered medications for this visit.    Allergies as of 01/18/2015 - Review Complete 01/18/2015  Allergen  Reaction Noted  . Demerol [meperidine] Rash 09/12/2014    Family History  Problem Relation Age of Onset  . Cancer Mother   . Cancer Father     Social History   Social History  . Marital Status: Married    Spouse Name: N/A  . Number of Children: N/A  . Years of Education: N/A   Occupational History  . Not on file.   Social History Main Topics  . Smoking status: Former Smoker -- 1.50 packs/day for 47 years    Types: Cigarettes    Start date: 12/03/1966    Quit date: 12/08/2013  . Smokeless tobacco: Never Used  . Alcohol Use: No  . Drug Use: No  . Sexual Activity: Yes    Other Topics Concern  . Not on file   Social History Narrative    Review of Systems: General: Negative for anorexia, weight loss, fever, chills, fatigue, weakness. Eyes: Negative for vision changes.  ENT: Negative for hoarseness, difficulty swallowing. CV: Negative for chest pain, angina, palpitations, peripheral edema.  Respiratory: Negative for dyspnea at rest, cough, sputum, wheezing.  GI: See history of present illness. MS: Negative for joint pain, low back pain.  Derm: Negative for rash or itching.  Endo: Negative for unusual weight change.  Heme: Negative for bruising or bleeding.    Physical Exam: BP 131/80 mmHg  Pulse 74  Temp(Src) 97 F (36.1 C)  Ht 6' (1.829 m)  Wt 192 lb 3.2 oz (87.181 kg)  BMI 26.06 kg/m2 General:   Alert and oriented. Pleasant and cooperative. Well-nourished and well-developed.  Head:  Normocephalic and atraumatic. Eyes:  Without icterus, sclera clear and conjunctiva pink.  Ears:  Normal auditory acuity. Cardiovascular:  S1, S2 present without murmurs appreciated. Normal pulses noted. Extremities without clubbing or edema. Respiratory:  Clear to auscultation bilaterally. No wheezes, rales, or rhonchi. No distress.  Gastrointestinal:  +BS, rounded but soft, non-tender and non-distended. No guarding or rebound. No masses appreciated.  Rectal:  Deferred  Skin:  Intact without significant lesions or rashes. Neurologic:  Alert and oriented x4;  grossly normal neurologically. Psych:  Alert and cooperative. Normal mood and affect. Heme/Lymph/Immune: No excessive bruising noted.    01/18/2015 8:50 AM

## 2015-01-18 NOTE — Patient Instructions (Signed)
1. Have your labs drawn when you're able to. 2. We will help you schedule your ultrasound. 3. Return for follow-up in 6 weeks.

## 2015-01-19 LAB — HEPATITIS C ANTIBODY: HCV Ab: REACTIVE — AB

## 2015-01-19 LAB — HEPATITIS C RNA QUANTITATIVE
HCV QUANT LOG: 4.82 {Log} — AB (ref <1.18)
HCV Quantitative: 65485 IU/mL — ABNORMAL HIGH (ref <15)

## 2015-01-19 LAB — CBC WITH DIFFERENTIAL/PLATELET
Basophils Absolute: 0 10*3/uL (ref 0.0–0.1)
Basophils Relative: 0 % (ref 0–1)
EOS PCT: 3 % (ref 0–5)
Eosinophils Absolute: 0.3 10*3/uL (ref 0.0–0.7)
HEMATOCRIT: 40.9 % (ref 39.0–52.0)
Hemoglobin: 13.5 g/dL (ref 13.0–17.0)
LYMPHS ABS: 2.9 10*3/uL (ref 0.7–4.0)
LYMPHS PCT: 33 % (ref 12–46)
MCH: 31.3 pg (ref 26.0–34.0)
MCHC: 33 g/dL (ref 30.0–36.0)
MCV: 94.7 fL (ref 78.0–100.0)
MONO ABS: 1 10*3/uL (ref 0.1–1.0)
MPV: 11.6 fL (ref 8.6–12.4)
Monocytes Relative: 12 % (ref 3–12)
Neutro Abs: 4.5 10*3/uL (ref 1.7–7.7)
Neutrophils Relative %: 52 % (ref 43–77)
Platelets: 260 10*3/uL (ref 150–400)
RBC: 4.32 MIL/uL (ref 4.22–5.81)
RDW: 13.4 % (ref 11.5–15.5)
WBC: 8.7 10*3/uL (ref 4.0–10.5)

## 2015-01-19 LAB — COMPREHENSIVE METABOLIC PANEL
ALT: 47 U/L — ABNORMAL HIGH (ref 9–46)
AST: 37 U/L — ABNORMAL HIGH (ref 10–35)
Albumin: 4 g/dL (ref 3.6–5.1)
Alkaline Phosphatase: 65 U/L (ref 40–115)
BUN: 12 mg/dL (ref 7–25)
CALCIUM: 9 mg/dL (ref 8.6–10.3)
CHLORIDE: 98 mmol/L (ref 98–110)
CO2: 31 mmol/L (ref 20–31)
Creat: 0.78 mg/dL (ref 0.70–1.25)
GLUCOSE: 124 mg/dL — AB (ref 65–99)
POTASSIUM: 4.9 mmol/L (ref 3.5–5.3)
Sodium: 136 mmol/L (ref 135–146)
Total Bilirubin: 0.5 mg/dL (ref 0.2–1.2)
Total Protein: 7.7 g/dL (ref 6.1–8.1)

## 2015-01-19 LAB — PROTIME-INR
INR: 1.08 (ref <1.50)
Prothrombin Time: 14.1 seconds (ref 11.6–15.2)

## 2015-01-19 NOTE — Progress Notes (Signed)
CC'ED TO PCP 

## 2015-01-26 LAB — HEPATITIS C GENOTYPE

## 2015-02-08 DIAGNOSIS — J189 Pneumonia, unspecified organism: Secondary | ICD-10-CM

## 2015-02-08 HISTORY — DX: Pneumonia, unspecified organism: J18.9

## 2015-02-15 ENCOUNTER — Ambulatory Visit (HOSPITAL_COMMUNITY)
Admission: RE | Admit: 2015-02-15 | Discharge: 2015-02-15 | Disposition: A | Discharge status: Home / Self Care | Payer: Medicaid Other | Source: Ambulatory Visit | Attending: Nurse Practitioner | Admitting: Nurse Practitioner

## 2015-02-15 DIAGNOSIS — Z8619 Personal history of other infectious and parasitic diseases: Secondary | ICD-10-CM | POA: Insufficient documentation

## 2015-02-15 DIAGNOSIS — K8020 Calculus of gallbladder without cholecystitis without obstruction: Secondary | ICD-10-CM | POA: Insufficient documentation

## 2015-02-27 ENCOUNTER — Ambulatory Visit (INDEPENDENT_AMBULATORY_CARE_PROVIDER_SITE_OTHER): Payer: Medicaid Other | Admitting: Nurse Practitioner

## 2015-02-27 ENCOUNTER — Encounter: Payer: Self-pay | Admitting: Nurse Practitioner

## 2015-02-27 ENCOUNTER — Telehealth: Payer: Self-pay | Admitting: Internal Medicine

## 2015-02-27 ENCOUNTER — Other Ambulatory Visit: Payer: Self-pay

## 2015-02-27 VITALS — BP 136/78 | HR 77 | Temp 97.1°F | Ht 72.0 in | Wt 192.6 lb

## 2015-02-27 DIAGNOSIS — Z1211 Encounter for screening for malignant neoplasm of colon: Secondary | ICD-10-CM | POA: Insufficient documentation

## 2015-02-27 DIAGNOSIS — Z8619 Personal history of other infectious and parasitic diseases: Secondary | ICD-10-CM

## 2015-02-27 DIAGNOSIS — R1310 Dysphagia, unspecified: Secondary | ICD-10-CM | POA: Diagnosis not present

## 2015-02-27 MED ORDER — PEG 3350-KCL-NA BICARB-NACL 420 G PO SOLR: 4000.0000 mL | ORAL | Status: DC

## 2015-02-27 NOTE — Assessment & Plan Note (Signed)
She now admits complaint of dysphagia symptoms that occur with every meal. Food passes with time, no regurgitation noted. This point we'll proceed with an endoscopy with possible dilation for dysphagia symptoms in addition to need for variceal screening giving cirrhosis. This is also both in addition to colonoscopy. Return for follow-up in 2 months*prior authorization for Harvoni treatment  Proceed with EGD +/- dilation in the OR with propofol with Dr. Gala Romney in near future: the risks, benefits, and alternatives have been discussed with the patient in detail. The patient states understanding and desires to proceed.  The patient is not on any anticoagulants, chronic pain medications, or anxiolytics. He is on Prozac daily and has a history of alcohol abuse. Also had a poor sedation experience at last colonoscopy. For this reason we'll plan for the procedure and the OR on propofol/MAC to promote adequate sedation.

## 2015-02-27 NOTE — Progress Notes (Signed)
Referring Provider: Wendie Simmer, MD Primary Care Physician:  [name], NP Primary GI:  Dr. Gala Romney  Chief Complaint  Patient presents with  . Follow-up    HPI:   64 year old male previously seen in our office 01/18/2015 referred by PCP for history of hepatitis C. States she was diagnosed 25 years ago, high risk factors include birth cohort and history of IV drug use. Stated at that last visit he no longer uses IV drugs or drinks. Previous treatment couple decades ago with interferon was deferred and declined. Workup was begun at his last office visit. Abdomen ordered showed normal CBC, hep C antibody positive confirmed by RNA with genotype 1A, mild elevation of transaminases had AST/ALT 37/47, INR normal at 1.08. 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  Today he states he's doing pretty good. Denies yellowing of the skin and eyes, darkened urine. Last ETOH drink was about 2 years ago with family celebration. Last drug use was marijuana a few days ago. Last IV drug use about 43 years ago. States he takes his prescribed medications every day as ordered. Denies hematochezia, melena, abdominal pain, nausea. Had one episode of vomiting yesterday due to eating too fast. Last colonoscopy was more than 10 years ago. Is also having dysphagia about every time he eats, worse with eating fast. Thinks he needs to likely take his time with eating. Denies hematemesis. Denies fever, chills. Denies chest pain, worsening dyspnea, dizziness, lightheadedness, syncope, near syncope. Denies any other upper or lower GI symptoms.  Past Medical History  Diagnosis Date  . COPD (chronic obstructive pulmonary disease) ([place])   . Viral hepatitis C   . ETOH abuse     stopped 10 years ago  . Depression     suicide attempt 20 years ago  . Arthritis   . Asthma     Past Surgical History  Procedure Laterality Date  . Other surgical history Left 1995    fatty  tissue tumor right upper thigh  . Dental surgery N/A January 2016    "dentures and spurs"     Current Outpatient Prescriptions  Medication Sig Dispense Refill  . albuterol (PROVENTIL HFA;VENTOLIN HFA) 108 (90 BASE) MCG/ACT inhaler Inhale 1 puff into the lungs every 6 (six) hours as needed for wheezing or shortness of breath.    Marland Kitchen FLUoxetine (PROZAC) 20 MG capsule Take 20 mg by mouth daily.  2  . ketoconazole (NIZORAL) 2 % cream Apply 1 application topically 2 (two) times daily.  6  . levothyroxine (SYNTHROID, LEVOTHROID) 25 MCG tablet Take 25 mcg by mouth daily.  5  . Pseudoeph-Doxylamine-DM-APAP (NYQUIL MULTI-SYMPTOM PO) Take 1 tablet by mouth daily as needed.     Marland Kitchen SPIRIVA HANDIHALER 18 MCG inhalation capsule Place 1 puff into inhaler and inhale daily.  5  . mometasone (NASONEX) 50 MCG/ACT nasal spray Place 2 sprays into the nose daily. Reported on 02/27/2015    . nitroGLYCERIN (NITROSTAT) 0.4 MG SL tablet Place 1 tablet (0.4 mg total) under the tongue every 5 (five) minutes as needed for chest pain. (Patient not taking: Reported on 01/18/2015) 25 tablet 3   No current facility-administered medications for this visit.    Allergies as of 02/27/2015 - Review Complete 02/27/2015  Allergen Reaction Noted  . Demerol [meperidine] Rash 09/12/2014    Family History  Problem Relation Age of Onset  . Pancreatic cancer Mother   . Lung cancer Father     Social History  Social History  . Marital Status: Married    Spouse Name: N/A  . Number of Children: N/A  . Years of Education: N/A   Social History Main Topics  . Smoking status: Former Smoker -- 1.50 packs/day for 47 years    Types: Cigarettes    Start date: 12/03/1966    Quit date: 12/08/2013  . Smokeless tobacco: Never Used  . Alcohol Use: No     Comment: Quit 2006: previously drank heavily.  . Drug Use: No     Comment: Previous IV drug use as teenager  . Sexual Activity: Yes   Other Topics Concern  . None   Social  History Narrative    Review of Systems: General: Negative for anorexia, weight loss, fever, chills, fatigue, weakness. ENT: Negative for hoarseness, difficulty swallowing. CV: Negative for chest pain, angina, palpitations, peripheral edema.  Respiratory: Negative for dyspnea at rest, cough, sputum, wheezing.  GI: See history of present illness. Derm: Negative for rash or itching.  Neuro: Negative for memory loss, confusion.  Endo: Negative for unusual weight change.  Heme: Negative for bruising or bleeding.   Physical Exam: BP 136/78 mmHg  Pulse 77  Temp(Src) 97.1 F (36.2 C)  Ht 6' (1.829 m)  Wt 192 lb 9.6 oz (87.363 kg)  BMI 26.12 kg/m2 General:   Alert and oriented. Pleasant and cooperative. Well-nourished and well-developed.  Head:  Normocephalic and atraumatic. Eyes:  Without icterus, sclera clear and conjunctiva pink.  Ears:  Normal auditory acuity. Cardiovascular:  S1, S2 present without murmurs appreciated. Extremities without clubbing or edema. Respiratory:  Clear to auscultation bilaterally. No wheezes, rales, or rhonchi. No distress.  Gastrointestinal:  +BS, soft, non-tender and non-distended. No HSM noted. No guarding or rebound. No masses appreciated.  Rectal:  Deferred  Neurologic:  Alert and oriented x4;  grossly normal neurologically. Psych:  Alert and cooperative. Normal mood and affect. Heme/Lymph/Immune: No excessive bruising noted.    02/27/2015 9:31 AM

## 2015-02-27 NOTE — Telephone Encounter (Signed)
Spoke with pts wife. She wanted to let EG know that the pt has been having some anxiety and has been taking xanax bid without a prescription. She was concerned that this would interfere with his harvoni treatment. I spoke with EG, this would not interfere with his treatment but he really needs to speak with his pcp and get a prescription for this. I called the wife back and informed her, encouraged her to have him talk to his pcp. She said she really appreciated everything EG was doing for them and she was sorry that they did not disclose this when they were here.

## 2015-02-27 NOTE — Patient Instructions (Signed)
1. We'll schedule your procedures for you. 2. We will also have he signed a release so we can obtain her last colonoscopy records from where they were done at. 3. Return for follow-up in 2 months to review your procedure results and try to start filing for Harvoni treatment. 4. It is important to completely abstain from all drugs and/or alcohol.

## 2015-02-27 NOTE — Progress Notes (Signed)
CC'ED TO PCP 

## 2015-02-27 NOTE — Telephone Encounter (Signed)
Agreed, no further recommendations 

## 2015-02-27 NOTE — Assessment & Plan Note (Signed)
Patient states his last colonoscopy was many many years ago, definitely over 10 years ago. He had a poor sedation experience and has been hesitant to have a repeat screening. At this point he is eating endoscopy with possible dilation for variceal screening as well as dysphasia symptoms. We will add on a colonoscopy for screening as well as. We will have him sign a release so we can request his last colonoscopy records. Return for follow-up in 2 months as noted above.  Proceed with TCS in the OR with propofol/MAC with Dr. Gala Romney in near future: the risks, benefits, and alternatives have been discussed with the patient in detail. The patient states understanding and desires to proceed.  The patient is not on any anticoagulants, chronic pain medications, or anxiolytics. He is on Prozac daily and has a history of alcohol abuse. Also had a poor sedation experience at last colonoscopy. For this reason we'll plan for the procedure and the OR on propofol/MAC to promote adequate sedation.

## 2015-02-27 NOTE — Assessment & Plan Note (Signed)
Patient with serology confirmed positive hepatitis B see, genotype 1A. He is a candidate for Harvoni treatment. He has not done IV drugs in over 40 years. Has had one drink in the past 10 years. Unfortunately he did smoke marijuana one time approximately 3 days ago which she says was due to mistaken he does not do regularly. Emphasized the need to abstain from all drugs and alcohol to be a candidate for Harvoni treatment. He is needing endoscopy for variceal screening. Ultrasound elastography complete showing F2 and some F3. We'll have him return in 2 months to reassess and possibly*prior authorization for Harvoni.

## 2015-02-27 NOTE — Telephone Encounter (Signed)
PATIENT HAS QUESTION ABOUT APPOINTMENT FROM THIS MORNING    [phone]

## 2015-03-13 ENCOUNTER — Telehealth: Payer: Self-pay | Admitting: Internal Medicine

## 2015-03-13 NOTE — Telephone Encounter (Signed)
Will send to EG to see what he thinks

## 2015-03-13 NOTE — Telephone Encounter (Signed)
PATIENT WIFE, THERESA, CALLED AND STATED THAT THE PATIENT HAS BEEN IN THE HOSPITAL DUE TO CHEST PAINS AND IT WAS ACTUALLY PNEUMONIA AND HE IS SUPPOSED TO HAVE A COLONOSCOPY AND ENDO JAN 11TH . DOES HE NEEDS TO CANCEL COLONOSCOPY AND ENDO.  [place] OUT HE HAS DIABETES WAS PUT ON METFORMIN

## 2015-03-14 NOTE — Telephone Encounter (Signed)
Noted  

## 2015-03-14 NOTE — Telephone Encounter (Signed)
Talked with patients wife he is still not feeling good. He is going to his PCP next Thursday to follow up.She will then call us to give a update on how he is feeling and go from there.

## 2015-03-14 NOTE — Telephone Encounter (Signed)
Please call and ask if they gave him any restrictions and how is he feeling currently. Procedure isn't for another 12 days (03/26/15). We can call him in 1 week to see how he's doing and get a CBC to check his WBC count. If he's still not well and/or WBC continued elevated we will postpone the procedure.

## 2015-03-19 ENCOUNTER — Other Ambulatory Visit: Payer: Self-pay

## 2015-03-21 ENCOUNTER — Inpatient Hospital Stay (HOSPITAL_COMMUNITY): Admission: RE | Admit: 2015-03-21 | Payer: Medicaid Other | Source: Ambulatory Visit

## 2015-03-21 NOTE — Patient Instructions (Signed)
Brent Franco  03/21/2015     @PREFPERIOPPHARMACY @   Your procedure is scheduled on  03/26/2015   Report to [facility] at  31  A.M.  Call this number if you have problems the morning of surgery:  [phone]   Remember:  Do not eat food or drink liquids after midnight.  Take these medicines the morning of surgery with A SIP OF WATER  Xanax, prozac, synthroid. Take your inhalers and your nebulizer before you come. Only take 1/2 of your usual insulin dosage the night before your procedure. DO NOT take any diabetic medicines the morning of your procedure.   Do not wear jewelry, make-up or nail polish.  Do not wear lotions, powders, or perfumes.  You may wear deodorant.  Do not shave 48 hours prior to surgery.  Men may shave face and neck.  Do not bring valuables to the hospital.  [facility] is not responsible for any belongings or valuables.  Contacts, dentures or bridgework may not be worn into surgery.  Leave your suitcase in the car.  After surgery it may be brought to your room.  For patients admitted to the hospital, discharge time will be determined by your treatment team.  Patients discharged the day of surgery will not be allowed to drive home.   Name and phone number of your driver:   family Special instructions:  Follow the diet and prep instructions given to you by Dr Roseanne Kaufman office.  Please read over the following fact sheets that you were given. Pain Booklet, Coughing and Deep Breathing, Surgical Site Infection Prevention, Anesthesia Post-op Instructions and Care and Recovery After Surgery      Esophagogastroduodenoscopy Esophagogastroduodenoscopy (EGD) is a procedure that is used to examine the lining of the esophagus, stomach, and first part of the small intestine (duodenum). A long, flexible, lighted tube with a camera attached (endoscope) is inserted down the throat to view these organs. This procedure is done to detect problems or  abnormalities, such as inflammation, bleeding, ulcers, or growths, in order to treat them. The procedure lasts 5-20 minutes. It is usually an outpatient procedure, but it may need to be performed in a hospital in emergency cases. LET [facility] CARE PROVIDER KNOW ABOUT:  Any allergies you have.  All medicines you are taking, including vitamins, herbs, eye drops, creams, and over-the-counter medicines.  Previous problems you or members of your family have had with the use of anesthetics.  Any blood disorders you have.  Previous surgeries you have had.  Medical conditions you have. RISKS AND COMPLICATIONS Generally, this is a safe procedure. However, problems can occur and include:  Infection.  Bleeding.  Tearing (perforation) of the esophagus, stomach, or duodenum.  Difficulty breathing or not being able to breathe.  Excessive sweating.  Spasms of the larynx.  Slowed heartbeat.  Low blood pressure. BEFORE THE PROCEDURE  Do not eat or drink anything after midnight on the night before the procedure or as directed by your health care provider.  Do not take your regular medicines before the procedure if your health care provider asks you not to. Ask your health care provider about changing or stopping those medicines.  If you wear dentures, be prepared to remove them before the procedure.  Arrange for someone to drive you home after the procedure. PROCEDURE  A numbing medicine (local anesthetic) may be sprayed in your throat for comfort and to stop you  from gagging or coughing.  You will have an IV tube inserted in a vein in your hand or arm. You will receive medicines and fluids through this tube.  You will be given a medicine to relax you (sedative).  A pain reliever will be given through the IV tube.  A mouth guard may be placed in your mouth to protect your teeth and to keep you from biting on the endoscope.  You will be asked to lie on your left side.  The  endoscope will be inserted down your throat and into your esophagus, stomach, and duodenum.  Air will be put through the endoscope to allow your health care provider to clearly view the lining of your esophagus.  The lining of your esophagus, stomach, and duodenum will be examined. During the exam, your health care provider may:  Remove tissue to be examined under a microscope (biopsy) for inflammation, infection, or other medical problems.  Remove growths.  Remove objects (foreign bodies) that are stuck.  Treat any bleeding with medicines or other devices that stop tissues from bleeding (hot cautery, clipping devices).  Widen (dilate) or stretch narrowed areas of your esophagus and stomach.  The endoscope will be withdrawn. AFTER THE PROCEDURE  You will be taken to a recovery area for observation. Your blood pressure, heart rate, breathing rate, and blood oxygen level will be monitored often until the medicines you were given have worn off.  Do not eat or drink anything until the numbing medicine has worn off and your gag reflex has returned. You may choke.  Your health care provider should be able to discuss his or her findings with you. It will take longer to discuss the test results if any biopsies were taken.   This information is not intended to replace advice given to you by your health care provider. Make sure you discuss any questions you have with your health care provider.   Document Released: 06/27/2004 Document Revised: 03/17/2014 Document Reviewed: 01/28/2012 Elsevier Interactive Patient Education 2016 [employer]. Esophagogastroduodenoscopy, Care After Refer to this sheet in the next few weeks. These instructions provide you with information about caring for yourself after your procedure. Your health care provider may also give you more specific instructions. Your treatment has been planned according to current medical practices, but problems sometimes occur. Call your  health care provider if you have any problems or questions after your procedure. WHAT TO EXPECT AFTER THE PROCEDURE After your procedure, it is typical to feel:  Soreness in your throat.  Pain with swallowing.  Sick to your stomach (nauseous).  Bloated.  Dizzy.  Fatigued. HOME CARE INSTRUCTIONS  Do not eat or drink anything until the numbing medicine (local anesthetic) has worn off and your gag reflex has returned. You will know that the local anesthetic has worn off when you can swallow comfortably.  Do not drive or operate machinery until directed by your health care provider.  Take medicines only as directed by your health care provider. SEEK MEDICAL CARE IF:   You cannot stop coughing.  You are not urinating at all or less than usual. SEEK IMMEDIATE MEDICAL CARE IF:  You have difficulty swallowing.  You cannot eat or drink.  You have worsening throat or chest pain.  You have dizziness or lightheadedness or you faint.  You have nausea or vomiting.  You have chills.  You have a fever.  You have severe abdominal pain.  You have black, tarry, or bloody stools.   This  information is not intended to replace advice given to you by your health care provider. Make sure you discuss any questions you have with your health care provider.   Document Released: 02/11/2012 Document Revised: 03/17/2014 Document Reviewed: 02/11/2012 Elsevier Interactive Patient Education 2016 [employer]. Colonoscopy A colonoscopy is an exam to look at the entire large intestine (colon). This exam can help find problems such as tumors, polyps, inflammation, and areas of bleeding. The exam takes about 1 hour.  LET [facility] CARE PROVIDER KNOW ABOUT:   Any allergies you have.  All medicines you are taking, including vitamins, herbs, eye drops, creams, and over-the-counter medicines.  Previous problems you or members of your family have had with the use of anesthetics.  Any blood  disorders you have.  Previous surgeries you have had.  Medical conditions you have. RISKS AND COMPLICATIONS  Generally, this is a safe procedure. However, as with any procedure, complications can occur. Possible complications include:  Bleeding.  Tearing or rupture of the colon wall.  Reaction to medicines given during the exam.  Infection (rare). BEFORE THE PROCEDURE   Ask your health care provider about changing or stopping your regular medicines.  You may be prescribed an oral bowel prep. This involves drinking a large amount of medicated liquid, starting the day before your procedure. The liquid will cause you to have multiple loose stools until your stool is almost clear or light green. This cleans out your colon in preparation for the procedure.  Do not eat or drink anything else once you have started the bowel prep, unless your health care provider tells you it is safe to do so.  Arrange for someone to drive you home after the procedure. PROCEDURE   You will be given medicine to help you relax (sedative).  You will lie on your side with your knees bent.  A long, flexible tube with a light and camera on the end (colonoscope) will be inserted through the rectum and into the colon. The camera sends video back to a computer screen as it moves through the colon. The colonoscope also releases carbon dioxide gas to inflate the colon. This helps your health care provider see the area better.  During the exam, your health care provider may take a small tissue sample (biopsy) to be examined under a microscope if any abnormalities are found.  The exam is finished when the entire colon has been viewed. AFTER THE PROCEDURE   Do not drive for 24 hours after the exam.  You may have a small amount of blood in your stool.  You may pass moderate amounts of gas and have mild abdominal cramping or bloating. This is caused by the gas used to inflate your colon during the exam.  Ask when  your test results will be ready and how you will get your results. Make sure you get your test results.   This information is not intended to replace advice given to you by your health care provider. Make sure you discuss any questions you have with your health care provider.   Document Released: 02/22/2000 Document Revised: 12/15/2012 Document Reviewed: 11/01/2012 Elsevier Interactive Patient Education 2016 [employer] Colonoscopy, Care After Refer to this sheet in the next few weeks. These instructions provide you with information on caring for yourself after your procedure. Your health care provider may also give you more specific instructions. Your treatment has been planned according to current medical practices, but problems sometimes occur. Call your health care provider  if you have any problems or questions after your procedure. WHAT TO EXPECT AFTER THE PROCEDURE  After your procedure, it is typical to have the following:  A small amount of blood in your stool.  Moderate amounts of gas and mild abdominal cramping or bloating. HOME CARE INSTRUCTIONS  Do not drive, operate machinery, or sign important documents for 24 hours.  You may shower and resume your regular physical activities, but move at a slower pace for the first 24 hours.  Take frequent rest periods for the first 24 hours.  Walk around or put a warm pack on your abdomen to help reduce abdominal cramping and bloating.  Drink enough fluids to keep your urine clear or pale yellow.  You may resume your normal diet as instructed by your health care provider. Avoid heavy or fried foods that are hard to digest.  Avoid drinking alcohol for 24 hours or as instructed by your health care provider.  Only take over-the-counter or prescription medicines as directed by your health care provider.  If a tissue sample (biopsy) was taken during your procedure:  Do not take aspirin or blood thinners for 7 days, or as instructed by  your health care provider.  Do not drink alcohol for 7 days, or as instructed by your health care provider.  Eat soft foods for the first 24 hours. SEEK MEDICAL CARE IF: You have persistent spotting of blood in your stool 2-3 days after the procedure. SEEK IMMEDIATE MEDICAL CARE IF:  You have more than a small spotting of blood in your stool.  You pass large blood clots in your stool.  Your abdomen is swollen (distended).  You have nausea or vomiting.  You have a fever.  You have increasing abdominal pain that is not relieved with medicine.   This information is not intended to replace advice given to you by your health care provider. Make sure you discuss any questions you have with your health care provider.   Document Released: 10/09/2003 Document Revised: 12/15/2012 Document Reviewed: 11/01/2012 Elsevier Interactive Patient Education 2016 [employer] PATIENT INSTRUCTIONS POST-ANESTHESIA  IMMEDIATELY FOLLOWING SURGERY:  Do not drive or operate machinery for the first twenty four hours after surgery.  Do not make any important decisions for twenty four hours after surgery or while taking narcotic pain medications or sedatives.  If you develop intractable nausea and vomiting or a severe headache please notify your doctor immediately.  FOLLOW-UP:  Please make an appointment with your surgeon as instructed. You do not need to follow up with anesthesia unless specifically instructed to do so.  WOUND CARE INSTRUCTIONS (if applicable):  Keep a dry clean dressing on the anesthesia/puncture wound site if there is drainage.  Once the wound has quit draining you may leave it open to air.  Generally you should leave the bandage intact for twenty four hours unless there is drainage.  If the epidural site drains for more than 36-48 hours please call the anesthesia department.  QUESTIONS?:  Please feel free to call your physician or the hospital operator if you have any questions, and they  will be happy to assist you.

## 2015-03-22 NOTE — Telephone Encounter (Signed)
Talked with wife today and his PCP said that he should go ahead with the TCS. He is going to his prep-op tomorrow.

## 2015-03-22 NOTE — Telephone Encounter (Signed)
Noted  

## 2015-03-23 ENCOUNTER — Other Ambulatory Visit: Payer: Self-pay | Admitting: Internal Medicine

## 2015-03-23 ENCOUNTER — Encounter (HOSPITAL_COMMUNITY): Payer: Self-pay

## 2015-03-23 ENCOUNTER — Other Ambulatory Visit: Payer: Self-pay

## 2015-03-23 ENCOUNTER — Encounter (HOSPITAL_COMMUNITY)
Admission: RE | Admit: 2015-03-23 | Discharge: 2015-03-23 | Disposition: A | Discharge status: Home / Self Care | Payer: Medicaid Other | Source: Ambulatory Visit | Attending: Internal Medicine | Admitting: Internal Medicine

## 2015-03-23 ENCOUNTER — Ambulatory Visit (HOSPITAL_COMMUNITY)
Admission: RE | Admit: 2015-03-23 | Discharge: 2015-03-23 | Disposition: A | Discharge status: Home / Self Care | Payer: Medicaid Other | Source: Ambulatory Visit | Attending: Internal Medicine | Admitting: Internal Medicine

## 2015-03-23 DIAGNOSIS — D869 Sarcoidosis, unspecified: Secondary | ICD-10-CM | POA: Insufficient documentation

## 2015-03-23 DIAGNOSIS — Z01812 Encounter for preprocedural laboratory examination: Secondary | ICD-10-CM | POA: Diagnosis not present

## 2015-03-23 DIAGNOSIS — J90 Pleural effusion, not elsewhere classified: Secondary | ICD-10-CM | POA: Diagnosis not present

## 2015-03-23 DIAGNOSIS — D860 Sarcoidosis of lung: Secondary | ICD-10-CM

## 2015-03-23 HISTORY — DX: Type 2 diabetes mellitus without complications: E11.9

## 2015-03-23 HISTORY — DX: Hypothyroidism, unspecified: E03.9

## 2015-03-23 HISTORY — DX: Pneumonia, unspecified organism: J18.9

## 2015-03-23 LAB — CBC
HCT: 35.7 % — ABNORMAL LOW (ref 39.0–52.0)
HEMOGLOBIN: 11.3 g/dL — AB (ref 13.0–17.0)
MCH: 29.4 pg (ref 26.0–34.0)
MCHC: 31.7 g/dL (ref 30.0–36.0)
MCV: 92.7 fL (ref 78.0–100.0)
PLATELETS: 323 10*3/uL (ref 150–400)
RBC: 3.85 MIL/uL — ABNORMAL LOW (ref 4.22–5.81)
RDW: 13.5 % (ref 11.5–15.5)
WBC: 5.2 10*3/uL (ref 4.0–10.5)

## 2015-03-23 LAB — BASIC METABOLIC PANEL
Anion gap: 9 (ref 5–15)
BUN: 18 mg/dL (ref 6–20)
CHLORIDE: 104 mmol/L (ref 101–111)
CO2: 28 mmol/L (ref 22–32)
Calcium: 9 mg/dL (ref 8.9–10.3)
Creatinine, Ser: 0.9 mg/dL (ref 0.61–1.24)
GFR calc non Af Amer: >60 mL/min (ref >60)
Glucose, Bld: 148 mg/dL — ABNORMAL HIGH (ref 65–99)
Potassium: 4.7 mmol/L (ref 3.5–5.1)
SODIUM: 141 mmol/L (ref 135–145)

## 2015-03-23 NOTE — Progress Notes (Signed)
Pt was hospitalized in [facility] from December 24-31st with pneumonia.  He was treated with IV  antibiotics.  Today his sats are 97% on room air.  Dr. Milford Cage was notified and a chest xray was ordered.

## 2015-03-26 ENCOUNTER — Ambulatory Visit (HOSPITAL_COMMUNITY)
Admission: RE | Admit: 2015-03-26 | Discharge: 2015-03-26 | Disposition: A | Discharge status: Home / Self Care | Payer: Medicaid Other | Source: Ambulatory Visit | Attending: Internal Medicine | Admitting: Internal Medicine

## 2015-03-26 ENCOUNTER — Encounter (HOSPITAL_COMMUNITY): Payer: Self-pay | Admitting: *Deleted

## 2015-03-26 ENCOUNTER — Encounter: Payer: Self-pay | Admitting: Internal Medicine

## 2015-03-26 ENCOUNTER — Telehealth: Payer: Self-pay | Admitting: Nurse Practitioner

## 2015-03-26 ENCOUNTER — Ambulatory Visit (HOSPITAL_COMMUNITY): Payer: Medicaid Other | Admitting: Anesthesiology

## 2015-03-26 ENCOUNTER — Ambulatory Visit: Admit: 2015-03-26 | Payer: Medicaid Other | Admitting: Internal Medicine

## 2015-03-26 ENCOUNTER — Encounter (HOSPITAL_COMMUNITY)
Admission: RE | Disposition: A | Discharge status: Home / Self Care | Payer: Self-pay | Source: Ambulatory Visit | Attending: Internal Medicine

## 2015-03-26 DIAGNOSIS — K648 Other hemorrhoids: Secondary | ICD-10-CM | POA: Insufficient documentation

## 2015-03-26 DIAGNOSIS — Z800 Family history of malignant neoplasm of digestive organs: Secondary | ICD-10-CM | POA: Diagnosis not present

## 2015-03-26 DIAGNOSIS — C159 Malignant neoplasm of esophagus, unspecified: Secondary | ICD-10-CM | POA: Diagnosis not present

## 2015-03-26 DIAGNOSIS — Z801 Family history of malignant neoplasm of trachea, bronchus and lung: Secondary | ICD-10-CM | POA: Insufficient documentation

## 2015-03-26 DIAGNOSIS — Z1211 Encounter for screening for malignant neoplasm of colon: Secondary | ICD-10-CM | POA: Diagnosis not present

## 2015-03-26 DIAGNOSIS — Q438 Other specified congenital malformations of intestine: Secondary | ICD-10-CM | POA: Insufficient documentation

## 2015-03-26 DIAGNOSIS — Z87891 Personal history of nicotine dependence: Secondary | ICD-10-CM | POA: Diagnosis not present

## 2015-03-26 DIAGNOSIS — K5730 Diverticulosis of large intestine without perforation or abscess without bleeding: Secondary | ICD-10-CM | POA: Insufficient documentation

## 2015-03-26 DIAGNOSIS — J449 Chronic obstructive pulmonary disease, unspecified: Secondary | ICD-10-CM | POA: Diagnosis not present

## 2015-03-26 DIAGNOSIS — B1920 Unspecified viral hepatitis C without hepatic coma: Secondary | ICD-10-CM | POA: Insufficient documentation

## 2015-03-26 DIAGNOSIS — K229 Disease of esophagus, unspecified: Secondary | ICD-10-CM | POA: Insufficient documentation

## 2015-03-26 DIAGNOSIS — F329 Major depressive disorder, single episode, unspecified: Secondary | ICD-10-CM | POA: Insufficient documentation

## 2015-03-26 DIAGNOSIS — D120 Benign neoplasm of cecum: Secondary | ICD-10-CM | POA: Diagnosis not present

## 2015-03-26 DIAGNOSIS — M1990 Unspecified osteoarthritis, unspecified site: Secondary | ICD-10-CM | POA: Diagnosis not present

## 2015-03-26 DIAGNOSIS — R1310 Dysphagia, unspecified: Secondary | ICD-10-CM | POA: Diagnosis present

## 2015-03-26 DIAGNOSIS — Z86010 Personal history of colonic polyps: Secondary | ICD-10-CM | POA: Insufficient documentation

## 2015-03-26 DIAGNOSIS — K644 Residual hemorrhoidal skin tags: Secondary | ICD-10-CM | POA: Diagnosis not present

## 2015-03-26 DIAGNOSIS — K2289 Other specified disease of esophagus: Secondary | ICD-10-CM | POA: Insufficient documentation

## 2015-03-26 DIAGNOSIS — Z79899 Other long term (current) drug therapy: Secondary | ICD-10-CM | POA: Insufficient documentation

## 2015-03-26 HISTORY — PX: POLYPECTOMY: SHX5525

## 2015-03-26 HISTORY — PX: COLONOSCOPY WITH PROPOFOL: SHX5780

## 2015-03-26 HISTORY — PX: ESOPHAGOGASTRODUODENOSCOPY (EGD) WITH PROPOFOL: SHX5813

## 2015-03-26 HISTORY — PX: BIOPSY: SHX5522

## 2015-03-26 LAB — GLUCOSE, CAPILLARY
GLUCOSE-CAPILLARY: 105 mg/dL — AB (ref 65–99)
GLUCOSE-CAPILLARY: 98 mg/dL (ref 65–99)

## 2015-03-26 SURGERY — COLONOSCOPY WITH PROPOFOL
Anesthesia: Monitor Anesthesia Care

## 2015-03-26 MED ORDER — LIDOCAINE HCL (CARDIAC) 10 MG/ML IV SOLN
INTRAVENOUS | Status: DC | PRN
  Administered 2015-03-26: 50 mg via INTRAVENOUS

## 2015-03-26 MED ORDER — LIDOCAINE VISCOUS 2 % MT SOLN
OROMUCOSAL | Status: AC
  Filled 2015-03-26: qty 15

## 2015-03-26 MED ORDER — FENTANYL CITRATE (PF) 100 MCG/2ML IJ SOLN
INTRAMUSCULAR | Status: AC
  Filled 2015-03-26: qty 2

## 2015-03-26 MED ORDER — GLYCOPYRROLATE 0.2 MG/ML IJ SOLN
INTRAMUSCULAR | Status: AC
  Filled 2015-03-26: qty 1

## 2015-03-26 MED ORDER — PROPOFOL 500 MG/50ML IV EMUL
INTRAVENOUS | Status: DC | PRN
  Administered 2015-03-26: 90 ug/kg/min via INTRAVENOUS
  Administered 2015-03-26: 125 ug/kg/min via INTRAVENOUS

## 2015-03-26 MED ORDER — ONDANSETRON HCL 4 MG/2ML IJ SOLN
4.0000 mg | Freq: Once | INTRAMUSCULAR | Status: DC | PRN
Start: 2015-03-26 — End: 2015-03-26

## 2015-03-26 MED ORDER — LACTATED RINGERS IV SOLN
INTRAVENOUS | Status: DC
Start: 2015-03-26 — End: 2015-03-26
  Administered 2015-03-26: 11:00:00 via INTRAVENOUS

## 2015-03-26 MED ORDER — FENTANYL CITRATE (PF) 100 MCG/2ML IJ SOLN
25.0000 ug | INTRAMUSCULAR | Status: AC
  Administered 2015-03-26: 25 ug via INTRAVENOUS

## 2015-03-26 MED ORDER — ONDANSETRON HCL 4 MG/2ML IJ SOLN
4.0000 mg | Freq: Once | INTRAMUSCULAR | Status: AC
  Administered 2015-03-26: 4 mg via INTRAVENOUS

## 2015-03-26 MED ORDER — PROPOFOL 10 MG/ML IV BOLUS
INTRAVENOUS | Status: DC | PRN
  Administered 2015-03-26 (×2): 10 mg via INTRAVENOUS

## 2015-03-26 MED ORDER — FENTANYL CITRATE (PF) 100 MCG/2ML IJ SOLN: 25.0000 ug | INTRAMUSCULAR | Status: DC | PRN

## 2015-03-26 MED ORDER — MIDAZOLAM HCL 2 MG/2ML IJ SOLN
1.0000 mg | INTRAMUSCULAR | Status: DC | PRN
  Administered 2015-03-26: 2 mg via INTRAVENOUS
  Filled 2015-03-26 (×2): qty 2

## 2015-03-26 MED ORDER — LIDOCAINE VISCOUS 2 % MT SOLN
15.0000 mL | Freq: Once | OROMUCOSAL | Status: AC
  Administered 2015-03-26: 15 mL via OROMUCOSAL

## 2015-03-26 MED ORDER — GLYCOPYRROLATE 0.2 MG/ML IJ SOLN
0.2000 mg | Freq: Once | INTRAMUSCULAR | Status: AC
Start: 2015-03-26 — End: 2015-03-26
  Administered 2015-03-26: 0.2 mg via INTRAVENOUS

## 2015-03-26 MED ORDER — MIDAZOLAM HCL 2 MG/2ML IJ SOLN
INTRAMUSCULAR | Status: AC
  Filled 2015-03-26: qty 2

## 2015-03-26 MED ORDER — PROPOFOL 10 MG/ML IV BOLUS
INTRAVENOUS | Status: AC
  Filled 2015-03-26: qty 20

## 2015-03-26 MED ORDER — ONDANSETRON HCL 4 MG/2ML IJ SOLN
INTRAMUSCULAR | Status: AC
  Filled 2015-03-26: qty 2

## 2015-03-26 NOTE — Discharge Instructions (Signed)
EGD Discharge instructions Please read the instructions outlined below and refer to this sheet in the next few weeks. These discharge instructions provide you with general information on caring for yourself after you leave the hospital. Your doctor may also give you specific instructions. While your treatment has been planned according to the most current medical practices available, unavoidable complications occasionally occur. If you have any problems or questions after discharge, please call your doctor. ACTIVITY  You may resume your regular activity but move at a slower pace for the next 24 hours.   Take frequent rest periods for the next 24 hours.   Walking will help expel (get rid of) the air and reduce the bloated feeling in your abdomen.   No driving for 24 hours (because of the anesthesia (medicine) used during the test).   You may shower.   Do not sign any important legal documents or operate any machinery for 24 hours (because of the anesthesia used during the test).  NUTRITION  Drink plenty of fluids.   You may resume your normal diet.   Begin with a light meal and progress to your normal diet.   Avoid alcoholic beverages for 24 hours or as instructed by your caregiver.  MEDICATIONS  You may resume your normal medications unless your caregiver tells you otherwise.  WHAT YOU CAN EXPECT TODAY  You may experience abdominal discomfort such as a feeling of fullness or gas pains.  FOLLOW-UP  Your doctor will discuss the results of your test with you.  SEEK IMMEDIATE MEDICAL ATTENTION IF ANY OF THE FOLLOWING OCCUR:  Excessive nausea (feeling sick to your stomach) and/or vomiting.   Severe abdominal pain and distention (swelling).   Trouble swallowing.   Temperature over 101 F (37.8 C).   Rectal bleeding or vomiting of blood.   Colonoscopy Discharge Instructions  Read the instructions outlined below and refer to this sheet in the next few weeks. These  discharge instructions provide you with general information on caring for yourself after you leave the hospital. Your doctor may also give you specific instructions. While your treatment has been planned according to the most current medical practices available, unavoidable complications occasionally occur. If you have any problems or questions after discharge, call Dr. Gala Franco at [phone]. ACTIVITY  You may resume your regular activity, but move at a slower pace for the next 24 hours.   Take frequent rest periods for the next 24 hours.   Walking will help get rid of the air and reduce the bloated feeling in your belly (abdomen).   No driving for 24 hours (because of the medicine (anesthesia) used during the test).    Do not sign any important legal documents or operate any machinery for 24 hours (because of the anesthesia used during the test).  NUTRITION  Drink plenty of fluids.   You may resume your normal diet as instructed by your doctor.   Begin with a light meal and progress to your normal diet. Heavy or fried foods are harder to digest and may make you feel sick to your stomach (nauseated).   Avoid alcoholic beverages for 24 hours or as instructed.  MEDICATIONS  You may resume your normal medications unless your doctor tells you otherwise.  WHAT YOU CAN EXPECT TODAY  Some feelings of bloating in the abdomen.   Passage of more gas than usual.   Spotting of blood in your stool or on the toilet paper.  IF YOU HAD POLYPS REMOVED DURING THE COLONOSCOPY:  No aspirin products for 7 days or as instructed.   No alcohol for 7 days or as instructed.   Eat a soft diet for the next 24 hours.  FINDING OUT THE RESULTS OF YOUR TEST Not all test results are available during your visit. If your test results are not back during the visit, make an appointment with your caregiver to find out the results. Do not assume everything is normal if you have not heard from your caregiver or the  medical facility. It is important for you to follow up on all of your test results.  SEEK IMMEDIATE MEDICAL ATTENTION IF:  You have more than a spotting of blood in your stool.   Your belly is swollen (abdominal distention).   You are nauseated or vomiting.   You have a temperature over 101.   You have abdominal pain or discomfort that is severe or gets worse throughout the day.  Diverticulosis Diverticulosis is the condition that develops when small pouches (diverticula) form in the wall of your colon. Your colon, or large intestine, is where water is absorbed and stool is formed. The pouches form when the inside layer of your colon pushes through weak spots in the outer layers of your colon. CAUSES  No one knows exactly what causes diverticulosis. RISK FACTORS  Being older than 24. Your risk for this condition increases with age. Diverticulosis is rare in people younger than 40 years. By age 63, almost everyone has it.  Eating a low-fiber diet.  Being frequently constipated.  Being overweight.  Not getting enough exercise.  Smoking.  Taking over-the-counter pain medicines, like aspirin and ibuprofen. SYMPTOMS  Most people with diverticulosis do not have symptoms. DIAGNOSIS  Because diverticulosis often has no symptoms, health care providers often discover the condition during an exam for other colon problems. In many cases, a health care provider will diagnose diverticulosis while using a flexible scope to examine the colon (colonoscopy). TREATMENT  If you have never developed an infection related to diverticulosis, you may not need treatment. If you have had an infection before, treatment may include:  Eating more fruits, vegetables, and grains.  Taking a fiber supplement.  Taking a live bacteria supplement (probiotic).  Taking medicine to relax your colon. HOME CARE INSTRUCTIONS   Drink at least 6-8 glasses of water each day to prevent constipation.  Try not to  strain when you have a bowel movement.  Keep all follow-up appointments. If you have had an infection before:  Increase the fiber in your diet as directed by your health care provider or dietitian.  Take a dietary fiber supplement if your health care provider approves.  Only take medicines as directed by your health care provider. SEEK MEDICAL CARE IF:   You have abdominal pain.  You have bloating.  You have cramps.  You have not gone to the bathroom in 3 days. SEEK IMMEDIATE MEDICAL CARE IF:   Your pain gets worse.  Yourbloating becomes very bad.  You have a fever or chills, and your symptoms suddenly get worse.  You begin vomiting.  You have bowel movements that are bloody or black. MAKE SURE YOU:  Understand these instructions.  Will watch your condition.  Will get help right away if you are not doing well or get worse.   This information is not intended to replace advice given to you by your health care provider. Make sure you discuss any questions you have with your health care provider.   Document Released:  11/22/2003 Document Revised: 03/01/2013 Document Reviewed: 01/19/2013 Elsevier Interactive Patient Education 2016 [employer].  Colonoscopy, Care After Refer to this sheet in the next few weeks. These instructions provide you with information on caring for yourself after your procedure. Your health care provider may also give you more specific instructions. Your treatment has been planned according to current medical practices, but problems sometimes occur. Call your health care provider if you have any problems or questions after your procedure. WHAT TO EXPECT AFTER THE PROCEDURE  After your procedure, it is typical to have the following:  A small amount of blood in your stool.  Moderate amounts of gas and mild abdominal cramping or bloating. HOME CARE INSTRUCTIONS  Do not drive, operate machinery, or sign important documents for 24 hours.  You may  shower and resume your regular physical activities, but move at a slower pace for the first 24 hours.  Take frequent rest periods for the first 24 hours.  Walk around or put a warm pack on your abdomen to help reduce abdominal cramping and bloating.  Drink enough fluids to keep your urine clear or pale yellow.  You may resume your normal diet as instructed by your health care provider. Avoid heavy or fried foods that are hard to digest.  Avoid drinking alcohol for 24 hours or as instructed by your health care provider.  Only take over-the-counter or prescription medicines as directed by your health care provider.  If a tissue sample (biopsy) was taken during your procedure:  Do not take aspirin or blood thinners for 7 days, or as instructed by your health care provider.  Do not drink alcohol for 7 days, or as instructed by your health care provider.  Eat soft foods for the first 24 hours. SEEK MEDICAL CARE IF: You have persistent spotting of blood in your stool 2-3 days after the procedure. SEEK IMMEDIATE MEDICAL CARE IF:  You have more than a small spotting of blood in your stool.  You pass large blood clots in your stool.  Your abdomen is swollen (distended).  You have nausea or vomiting.  You have a fever.  You have increasing abdominal pain that is not relieved with medicine.   This information is not intended to replace advice given to you by your health care provider. Make sure you discuss any questions you have with your health care provider.   Document Released: 10/09/2003 Document Revised: 12/15/2012 Document Reviewed: 11/01/2012 Elsevier Interactive Patient Education [employer].  Colon polyp and diverticulosis information provided  We are scheduling a CT of your chest and abdomen to evaluate the growth in your esophagus further  Stay on a full liquid diet  Further recommendations to follow Full Liquid Diet A full liquid diet may be used:   To help  you transition from a clear liquid diet to a soft diet.   When your body is healing and can only tolerate foods that are easy to digest.  Before or after certain a procedure, test, or surgery (such as stomach or intestinal surgeries).   If you have trouble swallowing or chewing.  A full liquid diet includes fluids and foods that are liquid or will become liquid at room temperature. The full liquid diet gives you the proteins, fluids, salts, and minerals that you need for energy. If you continue this diet for more than 72 hours, talk to your health care provider about how many calories you need to consume. If you continue the diet for more than  5 days, talk to your health care provider about taking a multivitamin or a nutritional supplement. WHAT DO I NEED TO KNOW ABOUT A FULL LIQUID DIET?  You may have any liquid.  You may have any food that becomes a liquid at room temperature. The food is considered a liquid if it can be poured off a spoon at room temperature.  Drink one serving of citrus or vitamin C-enriched fruit juice daily. WHAT FOODS CAN I EAT? Grains Any grain food that can be pureed in soup (such as crackers, pasta, and rice). Hot cereal (such as farina or oatmeal) that has been blended. Talk to your health care provider or dietitian about these foods. Vegetables Pulp-free tomato or vegetable juice. Vegetables pureed in soup.  Fruits Fruit juice, including nectars and juices with pulp. Meats and Other Protein Sources Eggs in custard, eggnog mix, and eggs used in ice cream or pudding. Strained meats, like in baby food, may be allowed. Consult your health care provider.  Dairy Milk and milk-based beverages, including milk shakes and instant breakfast mixes. Smooth yogurt. Pureed cottage cheese. Avoid these foods if they are not well tolerated. Beverages All beverages, including liquid nutritional supplements. Ask your health care provider if you can have carbonated beverages.  They may not be well tolerated. Condiments Iodized salt, pepper, spices, and flavorings. Cocoa powder. Vinegar, ketchup, yellow mustard, smooth sauces (such as hollandaise, cheese sauce, or white sauce), and soy sauce. Sweets and Desserts Custard, smooth pudding. Flavored gelatin. Tapioca, junket. Plain ice cream, sherbet, fruit ices. Frozen ice pops, frozen fudge pops, pudding pops, and other frozen bars with cream. Syrups, including chocolate syrup. Sugar, honey, jelly.  Fats and Oils Margarine, butter, cream, sour cream, and oils. Other Broth and cream soups. Strained, broth-based soups. The items listed above may not be a complete list of recommended foods or beverages. Contact your dietitian for more options.  WHAT FOODS CAN I NOT EAT? Grains All breads. Grains are not allowed unless they are pureed into soup. Vegetables Vegetables are not allowed unless they are juiced, or cooked and pureed into soup. Fruits Fruits are not allowed unless they are juiced. Meats and Other Protein Sources Any meat or fish. Cooked or raw eggs. Nut butters.  Dairy Cheese.  Condiments Stone ground mustards. Fats and Oils Fats that are coarse or chunky. Sweets and Desserts Ice cream or other frozen desserts that have any solids in them or on top, such as nuts, chocolate chips, and pieces of cookies. Cakes. Cookies. Candy. Others Soups with chunks or pieces in them. The items listed above may not be a complete list of foods and beverages to avoid. Contact your dietitian for more information.   This information is not intended to replace advice given to you by your health care provider. Make sure you discuss any questions you have with your health care provider.   Document Released: 02/24/2005 Document Revised: 03/01/2013 Document Reviewed: 12/30/2012 Elsevier Interactive Patient Education [employer].

## 2015-03-26 NOTE — Interval H&P Note (Signed)
History and Physical Interval Note:  03/26/2015 11:29 AM  Brent Franco  has presented today for surgery, with the diagnosis of SCREENING/DYSPHAGIA  The various methods of treatment have been discussed with the patient and family. After consideration of risks, benefits and other options for treatment, the patient has consented to  Procedure(s) with comments: COLONOSCOPY WITH PROPOFOL (N/A) - 1100 ESOPHAGOGASTRODUODENOSCOPY (EGD) WITH PROPOFOL (N/A) as a surgical intervention .  The patient's history has been reviewed, patient examined, no change in status, stable for surgery.  I have reviewed the patient's chart and labs.  Questions were answered to the patient's satisfaction.     Brent Franco  No change. EGD with possible ED and colonoscopy per plan. The risks, benefits, limitations, imponderables and alternatives regarding both EGD and colonoscopy have been reviewed with the patient. Questions have been answered. All parties agreeable.

## 2015-03-26 NOTE — Anesthesia Postprocedure Evaluation (Signed)
Anesthesia Post Note  Patient: Brent Franco  Procedure(s) Performed: Procedure(s) (LRB): COLONOSCOPY WITH PROPOFOL (N/A) ESOPHAGOGASTRODUODENOSCOPY (EGD) WITH PROPOFOL (N/A) BIOPSY POLYPECTOMY  Patient location during evaluation: PACU Anesthesia Type: MAC Level of consciousness: awake Pain management: satisfactory to patient Vital Signs Assessment: post-procedure vital signs reviewed and stable Respiratory status: spontaneous breathing and patient connected to nasal cannula oxygen Cardiovascular status: stable Anesthetic complications: no                 Caylie Sandquist

## 2015-03-26 NOTE — Telephone Encounter (Signed)
Requested records from [facility] °

## 2015-03-26 NOTE — Anesthesia Procedure Notes (Signed)
Procedure Name: MAC Date/Time: 03/26/2015 11:34 AM Performed by: Vista Deck Pre-anesthesia Checklist: Patient identified, Emergency Drugs available, Suction available, Timeout performed and Patient being monitored Patient Re-evaluated:Patient Re-evaluated prior to inductionOxygen Delivery Method: Non-rebreather mask

## 2015-03-26 NOTE — Progress Notes (Signed)
Patient ID: Brent Franco, male   DOB: 08/25/1950, 65 y.o.   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 Chest CT ordered. Wife pointed out to me that he's had 2 chest CTs at [facility] 1 on December 22 and one on December 29 latter was a contrast study. I reviewed with Dr. Thornton Papas. Distal esophageal "prominence" ? collapsed hiatal hernia. Nearly 1 cm lymph nodes in the area. Loculated pleural effusion. Clinically, treated for pneumonia recently. 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  With this information, I am canceling the chest CT. Await biopsy results.

## 2015-03-26 NOTE — Transfer of Care (Signed)
Immediate Anesthesia Transfer of Care Note  Patient: Brent Franco  Procedure(s) Performed: Procedure(s) with comments: COLONOSCOPY WITH PROPOFOL (N/A) - 1100 ESOPHAGOGASTRODUODENOSCOPY (EGD) WITH PROPOFOL (N/A) BIOPSY - esophageal mass POLYPECTOMY - polypectomy at ileocecal valve  Patient Location: PACU  Anesthesia Type:MAC  Level of Consciousness: awake and patient cooperative  Airway & Oxygen Therapy: Patient Spontanous Breathing and Patient connected to nasal cannula oxygen  Post-op Assessment: Report given to RN, Post -op Vital signs reviewed and stable and Patient moving all extremities  Post vital signs: Reviewed and stable    Complications: No apparent anesthesia complications

## 2015-03-26 NOTE — Op Note (Signed)
[facility] [address] [place], [id number]   COLONOSCOPY PROCEDURE REPORT  PATIENT: Grissom, Gibbon  MR#: [id number] BIRTHDATE: 08-Sep-1950 , 77  yrs. old GENDER: male ENDOSCOPIST: R.  Garfield Cornea, MD [place] [id number] Izola Price, MD PROCEDURE DATE:  04-21-2015 PROCEDURE:   Colonoscopy with snare polypectomy INDICATIONS:First ever average risk colorectal cancer screening examination. MEDICATIONS: Deep sedation per Dr.  [facility] ASA CLASS:       Class III  CONSENT: The risks, benefits, alternatives and imponderables including but not limited to bleeding, perforation as well as the possibility of a missed lesion have been reviewed.  The potential for biopsy, lesion removal, etc. have also been discussed. Questions have been answered.  All parties agreeable.  Please see the history and physical in the medical record for more information.  DESCRIPTION OF PROCEDURE:   After the risks benefits and alternatives of the procedure were thoroughly explained, informed consent was obtained.  The digital rectal exam revealed no abnormalities of the rectum.   The [id number]  endoscope was introduced through the anus and advanced to the cecum, which was identified by both the appendix and ileocecal valve. No adverse events experienced.   The quality of the prep was adequate  The instrument was then slowly withdrawn as the colon was fully examined. Estimated blood loss is zero unless otherwise noted in this procedure report.      COLON FINDINGS: Internal and external hemorrhoids present; otherwise, normal-appearing rectal mucosa.  Redundant colon. Scattered left-sided diverticula; (1) 4 mm polyp opposite the ileocecal valve; otherwise, the remainder of the colonic mucosa appeared normal.  Retroflexion was performed. .  Withdrawal time=7 minutes 0 seconds.  The scope was withdrawn and the procedure completed. COMPLICATIONS: There were  no immediate complications. EBL 2 mL ENDOSCOPIC IMPRESSION: Colonic diverticulosis. Redundant colon. Single colonic polyp?"removed as described above.  SEE EGD REPORT  RECOMMENDATIONS: Follow-up on pathology.  eSigned:  R. Garfield Cornea, MD Rosalita Chessman [facility] 04-21-15 12:25 PM   cc:  CPT CODES: ICD CODES:  The ICD and CPT codes recommended by this software are interpretations from the data that the clinical staff has captured with the software.  The verification of the translation of this report to the ICD and CPT codes and modifiers is the sole responsibility of the health care institution and practicing physician where this report was generated.  [employer]. will not be held responsible for the validity of the ICD and CPT codes included on this report.  AMA assumes no liability for data contained or not contained herein. CPT is a Designer, television/film set of the [employer].

## 2015-03-26 NOTE — H&P (View-Only) (Signed)
Referring Provider: Wendie Simmer, MD Primary Care Physician:  [name], NP Primary GI:  Dr. Gala Romney  Chief Complaint  Patient presents with  . Follow-up    HPI:   65 year old male previously seen in our office 01/18/2015 referred by PCP for history of hepatitis C. States she was diagnosed 25 years ago, high risk factors include birth cohort and history of IV drug use. Stated at that last visit he no longer uses IV drugs or drinks. Previous treatment couple decades ago with interferon was deferred and declined. Workup was begun at his last office visit. Abdomen ordered showed normal CBC, hep C antibody positive confirmed by RNA with genotype 1A, mild elevation of transaminases had AST/ALT 37/47, INR normal at 1.08. 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  Today he states he's doing pretty good. Denies yellowing of the skin and eyes, darkened urine. Last ETOH drink was about 2 years ago with family celebration. Last drug use was marijuana a few days ago. Last IV drug use about 43 years ago. States he takes his prescribed medications every day as ordered. Denies hematochezia, melena, abdominal pain, nausea. Had one episode of vomiting yesterday due to eating too fast. Last colonoscopy was more than 10 years ago. Is also having dysphagia about every time he eats, worse with eating fast. Thinks he needs to likely take his time with eating. Denies hematemesis. Denies fever, chills. Denies chest pain, worsening dyspnea, dizziness, lightheadedness, syncope, near syncope. Denies any other upper or lower GI symptoms.  Past Medical History  Diagnosis Date  . COPD (chronic obstructive pulmonary disease) ([place])   . Viral hepatitis C   . ETOH abuse     stopped 10 years ago  . Depression     suicide attempt 20 years ago  . Arthritis   . Asthma     Past Surgical History  Procedure Laterality Date  . Other surgical history Left 1995    fatty  tissue tumor right upper thigh  . Dental surgery N/A January 2016    "dentures and spurs"     Current Outpatient Prescriptions  Medication Sig Dispense Refill  . albuterol (PROVENTIL HFA;VENTOLIN HFA) 108 (90 BASE) MCG/ACT inhaler Inhale 1 puff into the lungs every 6 (six) hours as needed for wheezing or shortness of breath.    Marland Kitchen FLUoxetine (PROZAC) 20 MG capsule Take 20 mg by mouth daily.  2  . ketoconazole (NIZORAL) 2 % cream Apply 1 application topically 2 (two) times daily.  6  . levothyroxine (SYNTHROID, LEVOTHROID) 25 MCG tablet Take 25 mcg by mouth daily.  5  . Pseudoeph-Doxylamine-DM-APAP (NYQUIL MULTI-SYMPTOM PO) Take 1 tablet by mouth daily as needed.     Marland Kitchen SPIRIVA HANDIHALER 18 MCG inhalation capsule Place 1 puff into inhaler and inhale daily.  5  . mometasone (NASONEX) 50 MCG/ACT nasal spray Place 2 sprays into the nose daily. Reported on 02/27/2015    . nitroGLYCERIN (NITROSTAT) 0.4 MG SL tablet Place 1 tablet (0.4 mg total) under the tongue every 5 (five) minutes as needed for chest pain. (Patient not taking: Reported on 01/18/2015) 25 tablet 3   No current facility-administered medications for this visit.    Allergies as of 02/27/2015 - Review Complete 02/27/2015  Allergen Reaction Noted  . Demerol [meperidine] Rash 09/12/2014    Family History  Problem Relation Age of Onset  . Pancreatic cancer Mother   . Lung cancer Father     Social History  Social History  . Marital Status: Married    Spouse Name: N/A  . Number of Children: N/A  . Years of Education: N/A   Social History Main Topics  . Smoking status: Former Smoker -- 1.50 packs/day for 47 years    Types: Cigarettes    Start date: 12/03/1966    Quit date: 12/08/2013  . Smokeless tobacco: Never Used  . Alcohol Use: No     Comment: Quit 2006: previously drank heavily.  . Drug Use: No     Comment: Previous IV drug use as teenager  . Sexual Activity: Yes   Other Topics Concern  . None   Social  History Narrative    Review of Systems: General: Negative for anorexia, weight loss, fever, chills, fatigue, weakness. ENT: Negative for hoarseness, difficulty swallowing. CV: Negative for chest pain, angina, palpitations, peripheral edema.  Respiratory: Negative for dyspnea at rest, cough, sputum, wheezing.  GI: See history of present illness. Derm: Negative for rash or itching.  Neuro: Negative for memory loss, confusion.  Endo: Negative for unusual weight change.  Heme: Negative for bruising or bleeding.   Physical Exam: BP 136/78 mmHg  Pulse 77  Temp(Src) 97.1 F (36.2 C)  Ht 6' (1.829 m)  Wt 192 lb 9.6 oz (87.363 kg)  BMI 26.12 kg/m2 General:   Alert and oriented. Pleasant and cooperative. Well-nourished and well-developed.  Head:  Normocephalic and atraumatic. Eyes:  Without icterus, sclera clear and conjunctiva pink.  Ears:  Normal auditory acuity. Cardiovascular:  S1, S2 present without murmurs appreciated. Extremities without clubbing or edema. Respiratory:  Clear to auscultation bilaterally. No wheezes, rales, or rhonchi. No distress.  Gastrointestinal:  +BS, soft, non-tender and non-distended. No HSM noted. No guarding or rebound. No masses appreciated.  Rectal:  Deferred  Neurologic:  Alert and oriented x4;  grossly normal neurologically. Psych:  Alert and cooperative. Normal mood and affect. Heme/Lymph/Immune: No excessive bruising noted.    02/27/2015 9:31 AM

## 2015-03-26 NOTE — Anesthesia Preprocedure Evaluation (Signed)
Anesthesia Evaluation  Patient identified by MRN, date of birth, ID band Patient awake    Reviewed: Allergy & Precautions, NPO status , Patient's Chart, lab work & pertinent test results  Airway Mallampati: II  TM Distance: >3 FB     Dental  (+) Edentulous Upper, Edentulous Lower   Pulmonary asthma , pneumonia, resolved, COPD, former smoker,    breath sounds clear to auscultation       Cardiovascular negative cardio ROS   Rhythm:Regular Rate:Normal     Neuro/Psych PSYCHIATRIC DISORDERS Depression    GI/Hepatic (+)     substance abuse (in remission)  alcohol use, Hepatitis -  Endo/Other  diabetes, Type 2Hypothyroidism   Renal/GU      Musculoskeletal   Abdominal   Peds  Hematology   Anesthesia Other Findings   Reproductive/Obstetrics                             Anesthesia Physical Anesthesia Plan  ASA: III  Anesthesia Plan: MAC   Post-op Pain Management:    Induction: Intravenous  Airway Management Planned: Simple Face Mask  Additional Equipment:   Intra-op Plan:   Post-operative Plan:   Informed Consent: I have reviewed the patients History and Physical, chart, labs and discussed the procedure including the risks, benefits and alternatives for the proposed anesthesia with the patient or authorized representative who has indicated his/her understanding and acceptance.     Plan Discussed with:   Anesthesia Plan Comments:         Anesthesia Quick Evaluation

## 2015-03-26 NOTE — Telephone Encounter (Signed)
Please request CT chest results for this patient from [facility] dated 03/08/15 (if not already done).

## 2015-03-26 NOTE — Op Note (Signed)
[facility] [address] [place], [address]   ENDOSCOPY PROCEDURE REPORT  PATIENT: Brent Franco, Brent Franco  MR#: [id number] BIRTHDATE: 1950/06/08 , 73  yrs. old GENDER: male ENDOSCOPIST: R.  Garfield Cornea, MD FACP [facility] REFERRED BY:  Wendie Simmer, MD PROCEDURE DATE:  March 31, 2015 PROCEDURE:  EGD w/ biopsy INDICATIONS:  Esophageal dysphagia; screening for esophageal varices. MEDICATIONS: Deep sedation per Dr.  Patsey Berthold and associated ASA CLASS:      Class III  CONSENT: The risks, benefits, limitations, alternatives and imponderables have been discussed.  The potential for biopsy, esophogeal dilation, etc. have also been reviewed.  Questions have been answered.  All parties agreeable.  Please see the history and physical in the medical record for more information.  DESCRIPTION OF PROCEDURE: After the risks benefits and alternatives of the procedure were thoroughly explained, informed consent was obtained.  The [id number] endoscope was introduced through the mouth and advanced to the second portion of the duodenum , limited by Without limitations. The instrument was slowly withdrawn as the mucosa was fully examined. Estimated blood loss is zero unless otherwise noted in this procedure report.    Patient had an approximately 4 cm mixed submucosal and exophytic infiltrating type tumor involving the distal esophagus encroaching significantly on the lumen.  This lesion was noted to begin at approximately 35 cm from the incisors and extended down to 39 cm from the incisors (approximately 1 cm above the GE junction).  I was able to get around it with some effort using the adult gastroscope.  I did not see any esophageal varices.  Stomach empty. 2 cm hiatal hernia.  No portal gastropathy or varices seen. Patent pylorus.  Normal-appearing first and second portion of the duodenum  Biopsies of the esophageal mass were taken for histologic study.  No dilation  attempted.  Retroflexed views revealed a hiatal hernia. The scope was then withdrawn from the patient and the procedure completed.  COMPLICATIONS: There were no immediate complications.  ENDOSCOPIC IMPRESSION: Bulky esophageal tumor?"status post biopsy. 2 cm hiatal hernia.   RECOMMENDATIONS: Full liquid diet. Follow up on pathology. See colonoscopy report. Will proceed with CT of the chest and abdomen  REPEAT EXAM:  eSigned:  R. Garfield Cornea, MD [facility] 03/31/2015 12:04 PM    CC:  CPT CODES: ICD CODES:  The ICD and CPT codes recommended by this software are interpretations from the data that the clinical staff has captured with the software.  The verification of the translation of this report to the ICD and CPT codes and modifiers is the sole responsibility of the health care institution and practicing physician where this report was generated.  [employer] will not be held responsible for the validity of the ICD and CPT codes included on this report.  AMA assumes no liability for data contained or not contained herein. CPT is a Designer, television/film set of the [employer].  PATIENT NAME:  [name], [name] MR#: [id number]

## 2015-03-26 NOTE — Telephone Encounter (Signed)
Per RMR, please schedule patient for follow-up chest XRay in 6 months. Thanks.

## 2015-03-29 ENCOUNTER — Telehealth: Payer: Self-pay | Admitting: Nurse Practitioner

## 2015-03-29 ENCOUNTER — Other Ambulatory Visit: Payer: Self-pay

## 2015-03-29 DIAGNOSIS — C159 Malignant neoplasm of esophagus, unspecified: Secondary | ICD-10-CM

## 2015-03-29 DIAGNOSIS — K2289 Other specified disease of esophagus: Secondary | ICD-10-CM

## 2015-03-29 DIAGNOSIS — K228 Other specified diseases of esophagus: Secondary | ICD-10-CM

## 2015-03-29 MED ORDER — OXYCODONE-ACETAMINOPHEN 5-325 MG PO TABS: 1.0000 | ORAL_TABLET | Freq: Four times a day (QID) | ORAL | Status: DC | PRN

## 2015-03-29 NOTE — Progress Notes (Signed)
Route

## 2015-03-29 NOTE — Progress Notes (Signed)
i have faxed the CT reports from [facility] to Endo for RMR to look at.

## 2015-03-29 NOTE — Telephone Encounter (Signed)
-----   Message from Daneil Dolin, MD sent at 03/29/2015 10:50 AM EST ----- Discussed with Dr. Tresa Moore, the pathologist. Patient has poorly differentiated esophageal cancer. Needs to get to Dr. Whitney Muse ASAP Please get an expedited appointment for the patient  I have informed the patient's wife of the cancer diagnosis via telephone today. She relates patient having some chest discomfort related to the tumor.  Randall Hiss, can we get him some Percocet called into the pharmacy.  Tentatively, since adenoma removed from his colon, let's tentatively put him on the recall list for 5 years.

## 2015-03-29 NOTE — Telephone Encounter (Signed)
Please notify the patient that there is a prescription for pain medicine at the front desk for him to pick up. Because it's pain medication it cannot be sent electronically or by fax. Thanks.

## 2015-03-29 NOTE — Telephone Encounter (Signed)
Wife is aware and will pick up

## 2015-03-29 NOTE — Progress Notes (Signed)
Rx printed and at the front desk

## 2015-03-30 ENCOUNTER — Encounter (HOSPITAL_COMMUNITY): Payer: Medicaid Other | Attending: Hematology & Oncology | Admitting: Hematology & Oncology

## 2015-03-30 ENCOUNTER — Encounter: Payer: Self-pay | Admitting: Dietician

## 2015-03-30 ENCOUNTER — Encounter (HOSPITAL_COMMUNITY): Payer: Self-pay | Admitting: Hematology & Oncology

## 2015-03-30 ENCOUNTER — Other Ambulatory Visit (HOSPITAL_COMMUNITY): Payer: Self-pay | Admitting: *Deleted

## 2015-03-30 VITALS — BP 122/91 | HR 70 | Temp 97.4°F | Resp 18 | Ht 71.5 in | Wt 177.8 lb

## 2015-03-30 DIAGNOSIS — K229 Disease of esophagus, unspecified: Secondary | ICD-10-CM | POA: Insufficient documentation

## 2015-03-30 DIAGNOSIS — F32A Depression, unspecified: Secondary | ICD-10-CM

## 2015-03-30 DIAGNOSIS — R740 Nonspecific elevation of levels of transaminase and lactic acid dehydrogenase [LDH]: Secondary | ICD-10-CM | POA: Insufficient documentation

## 2015-03-30 DIAGNOSIS — C155 Malignant neoplasm of lower third of esophagus: Secondary | ICD-10-CM

## 2015-03-30 DIAGNOSIS — K7689 Other specified diseases of liver: Secondary | ICD-10-CM | POA: Insufficient documentation

## 2015-03-30 DIAGNOSIS — K2289 Other specified disease of esophagus: Secondary | ICD-10-CM

## 2015-03-30 DIAGNOSIS — F329 Major depressive disorder, single episode, unspecified: Secondary | ICD-10-CM

## 2015-03-30 LAB — COMPREHENSIVE METABOLIC PANEL
ALBUMIN: 4.3 g/dL (ref 3.5–5.0)
ALK PHOS: 71 U/L (ref 38–126)
ALT: 45 U/L (ref 17–63)
ANION GAP: 10 (ref 5–15)
AST: 43 U/L — ABNORMAL HIGH (ref 15–41)
BILIRUBIN TOTAL: 0.5 mg/dL (ref 0.3–1.2)
BUN: 11 mg/dL (ref 6–20)
CALCIUM: 9.6 mg/dL (ref 8.9–10.3)
CO2: 26 mmol/L (ref 22–32)
Chloride: 105 mmol/L (ref 101–111)
Creatinine, Ser: 0.92 mg/dL (ref 0.61–1.24)
GFR calc non Af Amer: >60 mL/min (ref >60)
GLUCOSE: 108 mg/dL — AB (ref 65–99)
Potassium: 4.7 mmol/L (ref 3.5–5.1)
SODIUM: 141 mmol/L (ref 135–145)
TOTAL PROTEIN: 9.1 g/dL — AB (ref 6.5–8.1)

## 2015-03-30 LAB — CBC WITH DIFFERENTIAL/PLATELET
BASOS ABS: 0 10*3/uL (ref 0.0–0.1)
BASOS PCT: 0 %
EOS ABS: 0.2 10*3/uL (ref 0.0–0.7)
EOS PCT: 3 %
HEMATOCRIT: 41.3 % (ref 39.0–52.0)
Hemoglobin: 13.3 g/dL (ref 13.0–17.0)
Lymphocytes Relative: 41 %
Lymphs Abs: 3.4 10*3/uL (ref 0.7–4.0)
MCH: 29.4 pg (ref 26.0–34.0)
MCHC: 32.2 g/dL (ref 30.0–36.0)
MCV: 91.2 fL (ref 78.0–100.0)
MONO ABS: 0.6 10*3/uL (ref 0.1–1.0)
Monocytes Relative: 7 %
NEUTROS ABS: 4 10*3/uL (ref 1.7–7.7)
Neutrophils Relative %: 49 %
PLATELETS: 279 10*3/uL (ref 150–400)
RBC: 4.53 MIL/uL (ref 4.22–5.81)
RDW: 13.7 % (ref 11.5–15.5)
WBC: 8.2 10*3/uL (ref 4.0–10.5)

## 2015-03-30 MED ORDER — ONDANSETRON HCL 8 MG PO TABS
8.0000 mg | ORAL_TABLET | Freq: Three times a day (TID) | ORAL | Status: DC | PRN
Start: 2015-03-30 — End: 2015-07-02

## 2015-03-30 MED ORDER — PROCHLORPERAZINE MALEATE 10 MG PO TABS: 10.0000 mg | ORAL_TABLET | Freq: Four times a day (QID) | ORAL | Status: DC | PRN

## 2015-03-30 MED ORDER — ALPRAZOLAM 1 MG PO TABS: 1.0000 mg | ORAL_TABLET | Freq: Three times a day (TID) | ORAL | Status: DC | PRN

## 2015-03-30 MED ORDER — LIDOCAINE-PRILOCAINE 2.5-2.5 % EX CREA: TOPICAL_CREAM | CUTANEOUS | Status: AC

## 2015-03-30 NOTE — Progress Notes (Signed)
Pt is a new esophageal cancer pt. Consulted by RN.   Contacted Pt by Phone  Wt Readings from Last 10 Encounters:  03/30/15 177 lb 12.8 oz (80.65 kg)  02/27/15 192 lb 9.6 oz (87.363 kg)  01/18/15 192 lb 3.2 oz (87.181 kg)  12/20/14 194 lb (87.998 kg)  09/21/14 193 lb (87.544 kg)  08/23/14 190 lb 3.2 oz (86.274 kg)   Patient has lost 15 lbs in the last month.   Wife reports that pt has had other troubles besides the new dx of cancer. He recently suffered from PNA and also just diagnosed with diabetes and subsequently placed on a DM diet. Wife believes these two issues are what have caused the dramatic wt loss.   Wife reports pt's current level of dysphagia is manageable. He is able to have liquids fine. He is also able to eat some softer foods, but only certain ones. She reports picking up a milkshake for him today that he was able to tolerate.   Talked to wife about the importance of protein and maintaining weight. She reported that she had spoken to MD about possible need for feeding tube. Answered a few other questions relating to feeding tubes and potential routes.   Wife reports placement of portacath in the next few days with initiation of chemo shortly after. Radiation is anticipated as well.  Emphasized that pt should find a supplement he enjoys and likes prior to chemoradiation in case his intake starts to suffer. She said she would start with Boost/Ensure  Will follow pt as he begins treatment  Mailed my contact info, coupons, and handouts titled "Soft and Moist High Protein Foods" and "Easy-to-Chew-and-swallow foods"  Brent Franco RD, LDN Nutrition Pager: [phone] 03/30/2015 3:39 PM

## 2015-03-30 NOTE — Progress Notes (Signed)
[facility] NOTE  Patient Care Team: [name], NP as PCP - General (Nurse Practitioner) Daneil Dolin, MD as Consulting Physician (Gastroenterology)  CHIEF COMPLAINTS/PURPOSE OF CONSULTATION:  EGD 03/26/2015 with bulky esophageal tumor involving the distal esophagus encroaching significantly upon the lumen. Lesion begins at 35cm from the incisors and extends down to 39 cm approximately 1 cm above the GE junction. No varices were noted, no portal gastropathy noted.  Pathology with invasive poorly differentiated carcinoma, extensive ulceration with fungal organisms (c/w candida)  HISTORY OF PRESENTING ILLNESS:  Brent Franco 65 y.o. male is here because of newly diagnosed esophageal carcinoma. He comes to the [facility] today for consult with his wife Clarene Critchley.   The patient was seen by GI for a diagnosis of hepatitis C. He was diagnosed approximately 25 years ago. He notes he was offered interferon at that time but did not take it. He was referred to GI to consider treatment. 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  He reported dysphagia and was referred for EGD, he also needed EGD for variceal screening.  His wife adds that he has been having some trouble swallowing off and on for "maybe 6 months." She reports that he had an "episode" in the summer where he got choked and "had to run outside to get rid of it." They attributed his issues swallowing to the fact that he'd just gotten dentures, assuming he wasn't chewing his food well enough. His wife reports that all she knows is that there's a 2-3 cm tumor in his throat, and that they know it is cancer.  Mr. Tagliaferri says he was mainly having issues swallowing solid foods, and denies any trouble swallowing liquids. He says he's been eating applesauce, pudding, and soup.   His wife says that, at first, he gained a lot of  weight when he quit smoking, but that now he is experiencing recent weight loss.  He has to be careful about blood sugar due to his diabetic concerns. He checks his blood sugars at home and says that they tend to run normal. He was given insulin recently just in case. His wife says she's seen it past 133-144, but that it runs in the 110's and 120's.   His wife reports that he has a lot of anxiety, and she asked his PCP if they could up his Xanax. She says they are on the "lowest dose" of Xanax. They go to [facility]. His wife says he was also on Prozac before this but were reducing his dose, because, as he reports, "I hate it." He says he gets sleepy early and wakes up early, going to bed at 5:30 and waking up at 3 or 4. He was put on Prozac because, according to his wife, "his PCP felt he had depression."  They are here today for additional discussion and recommendations in regards to his newly diagnosed esophageal carcinoma.   MEDICAL HISTORY:  Past Medical History  Diagnosis Date  . COPD (chronic obstructive pulmonary disease) ([place])   . ETOH abuse     stopped 10 years ago  . Depression     suicide attempt 20 years ago  . Arthritis   . Asthma   . Viral hepatitis C   . Diabetes mellitus without complication ([place])   . Pneumonia 02/2015  . Hypothyroidism     SURGICAL HISTORY:  Past Surgical History  Procedure Laterality Date  . Other surgical history Left 1995    fatty tissue tumor right upper thigh  . Dental surgery N/A January 2016    "dentures and spurs"   . Tumor excision Left 1990    inner thigh    SOCIAL HISTORY: Social History   Social History  . Marital Status: Married    Spouse Name: N/A  . Number of Children: N/A  . Years of Education: N/A   Occupational History  . Not on file.   Social History Main Topics  . Smoking status: Former Smoker -- 1.50 packs/day for 47 years    Types: Cigarettes    Start date: 12/03/1966    Quit date:  12/08/2013  . Smokeless tobacco: Never Used  . Alcohol Use: No     Comment: Quit 2006: previously drank heavily. Had 1 drink 2-3 years ago (2013-2014)  . Drug Use: No     Comment: Previous IV drug use as teenager, none in 40 years. Last marijuanna use 02/22/15.  Marland Kitchen Sexual Activity: Yes   Other Topics Concern  . Not on file   Social History Narrative   Married 33 years, 2 children, 50 and 72. No grandchildren. He's quit smoking for about almost 16 months. History of IVDA Has had problems in the past with alcohol, but quit many years ago. Occupationally he says he's done just about everything  Concrete work, cooking, Warehouse manager, roofing, cleaned pools; "a lot of everything." From [place], [place]  FAMILY HISTORY: Family History  Problem Relation Age of Onset  . Pancreatic cancer Mother   . Lung cancer Father   . Colon cancer Neg Hx    indicated that his mother is deceased. He indicated that his father is deceased.    Mother and father deceased Mother was around 46, father around 89 Father died of lung cancer; his father was a smoker Mother died of pancreatic cancer 1 brother dead from being shot in the head Older brother hasn't been seen or heard from in 10-12 years  ALLERGIES:  is allergic to demerol.  MEDICATIONS:  Current Outpatient Prescriptions  Medication Sig Dispense Refill  . albuterol (PROVENTIL HFA;VENTOLIN HFA) 108 (90 BASE) MCG/ACT inhaler Inhale 1 puff into the lungs every 6 (six) hours as needed for wheezing or shortness of breath.    . ALPRAZolam (XANAX) 0.5 MG tablet Take 1 tablet by mouth 2 (two) times daily as needed for anxiety.   1  . docusate sodium (COLACE) 100 MG capsule Take 100 mg by mouth 2 (two) times daily.  4  . FLUoxetine (PROZAC) 10 MG capsule Take 10 mg by mouth daily.  2  . HUMULIN R 100 UNIT/ML injection Inject 2-12 Units into the skin 2 (two) times daily as needed for high blood sugar. 151-200 = 2 units, 201-250 = 4 units, 251-300 = 6  units, 301-350 = 8 units, 351-400 = 10 units, 401-450 = 12 units.  2  . ipratropium-albuterol (DUONEB) 0.5-2.5 (3) MG/3ML SOLN INHALE CONTENTS OF 1 VIAL IN NEBULIZER EVERY 4 HOURS AS NEEDED FOR SHORTNESS OF BREATH.  3  . ketoconazole (NIZORAL) 2 % cream Apply 1 application topically 2 (two) times daily.  6  . levothyroxine (SYNTHROID, LEVOTHROID) 25 MCG tablet Take 25 mcg by mouth daily.  5  . metFORMIN (GLUCOPHAGE) 500 MG tablet Take 500 mg by mouth 2 (two) times daily.  3  . mometasone (NASONEX) 50 MCG/ACT nasal spray Place 2 sprays into the nose  daily as needed (congestion).     . nitroGLYCERIN (NITROSTAT) 0.4 MG SL tablet Place 1 tablet (0.4 mg total) under the tongue every 5 (five) minutes as needed for chest pain. 25 tablet 3  . oxyCODONE-acetaminophen (ROXICET) 5-325 MG tablet Take 1-2 tablets by mouth every 6 (six) hours as needed for moderate pain or severe pain. 30 tablet 0  . Pseudoeph-Doxylamine-DM-APAP (NYQUIL MULTI-SYMPTOM PO) Take 1 tablet by mouth daily as needed (congestion/cold).     . SPIRIVA HANDIHALER 18 MCG inhalation capsule Place 1 puff into inhaler and inhale daily.  5  . levofloxacin (LEVAQUIN) 750 MG tablet Take 750 mg by mouth daily. Reported on 03/30/2015  0   No current facility-administered medications for this visit.    Review of Systems  Constitutional: Positive for weight loss and malaise/fatigue. Negative for fever and chills.  HENT: Positive for sore throat. Negative for congestion, hearing loss, nosebleeds and tinnitus.   Eyes: Negative.  Negative for blurred vision, double vision, pain and discharge.  Respiratory: Negative.  Negative for cough, hemoptysis, sputum production, shortness of breath and wheezing.   Cardiovascular: Positive for chest pain. Negative for palpitations, claudication, leg swelling and PND.  Gastrointestinal: Positive for abdominal pain. Negative for heartburn, nausea, vomiting, diarrhea, constipation, blood in stool and melena.        Dysphagia  Genitourinary: Negative.  Negative for dysuria, urgency, frequency and hematuria.  Musculoskeletal: Negative.  Negative for myalgias, joint pain and falls.  Skin: Negative.  Negative for itching and rash.  Neurological: Negative.  Negative for dizziness, tingling, tremors, sensory change, speech change, focal weakness, seizures, loss of consciousness, weakness and headaches.  Endo/Heme/Allergies: Negative.  Does not bruise/bleed easily.  Psychiatric/Behavioral: Positive for depression. Negative for suicidal ideas, memory loss and substance abuse. The patient is nervous/anxious. The patient does not have insomnia.   All other systems reviewed and are negative.  14 point ROS was done and is otherwise as detailed above or in HPI   PHYSICAL EXAMINATION: ECOG PERFORMANCE STATUS: 1 - Symptomatic but completely ambulatory  Filed Vitals:   03/30/15 1140  BP: 122/91  Pulse: 70  Temp: 97.4 F (36.3 C)  Resp: 18   Filed Weights   03/30/15 1140  Weight: 177 lb 12.8 oz (80.65 kg)     Physical Exam  Constitutional: He is oriented to person, place, and time and well-developed, well-nourished, and in no distress.  Thin  HENT:  Head: Normocephalic and atraumatic.  Nose: Nose normal.  Mouth/Throat: Oropharynx is clear and moist. He has dentures. No oropharyngeal exudate.  Dentures on top and bottom.  Eyes: Conjunctivae and EOM are normal. Pupils are equal, round, and reactive to light. Right eye exhibits no discharge. Left eye exhibits no discharge. No scleral icterus.  Neck: Normal range of motion. Neck supple. No tracheal deviation present. No thyromegaly present.  Cardiovascular: Normal rate, regular rhythm and normal heart sounds.  Exam reveals no gallop and no friction rub.   No murmur heard. Pulmonary/Chest: Effort normal and breath sounds normal. He has no wheezes. He has no rales.  Abdominal: Soft. Bowel sounds are normal. He exhibits no distension and no mass. There is no  tenderness. There is no rebound and no guarding.  Musculoskeletal: Normal range of motion. He exhibits no edema.  Lymphadenopathy:    He has no cervical adenopathy.  Neurological: He is alert and oriented to person, place, and time. He has normal reflexes. No cranial nerve deficit. Gait normal. Coordination normal.  Skin: Skin is  warm and dry. No rash noted.  Psychiatric: Mood, memory, affect and judgment normal.  Nursing note and vitals reviewed.    LABORATORY DATA:  I have reviewed the data as listed Lab Results  Component Value Date   WBC 5.2 03/23/2015   HGB 11.3* 03/23/2015   HCT 35.7* 03/23/2015   MCV 92.7 03/23/2015   PLT 323 03/23/2015   CMP     Component Value Date/Time   NA 141 03/23/2015 1335   K 4.7 03/23/2015 1335   CL 104 03/23/2015 1335   CO2 28 03/23/2015 1335   GLUCOSE 148* 03/23/2015 1335   BUN 18 03/23/2015 1335   CREATININE 0.90 03/23/2015 1335   CREATININE 0.78 01/18/2015 0951   CALCIUM 9.0 03/23/2015 1335   PROT 7.7 01/18/2015 0951   ALBUMIN 4.0 01/18/2015 0951   AST 37* 01/18/2015 0951   ALT 47* 01/18/2015 0951   ALKPHOS 65 01/18/2015 0951   BILITOT 0.5 01/18/2015 0951   GFRNONAA >60 03/23/2015 1335   GFRAA >60 03/23/2015 1335    PATHOLOGY:        RADIOGRAPHIC STUDIES: I have personally reviewed the radiological images as listed and agreed with the findings in the report.  CLINICAL DATA: Recent admission for pneumonia. Known sarcoidosis. Preop for esophageal dilatation and colonoscopy. Altered mental status.  EXAM: CHEST 2 VIEW  COMPARISON: 03/06/2015 as well as chest CT 03/08/2015.  FINDINGS: Lungs are adequately inflated and demonstrate interval improvement patient's left-sided effusion with small residual effusion. Persistent opacification over the lateral left mid to upper lung with mild improvement. Cardiomediastinal silhouette and remainder of the exam is unchanged.  IMPRESSION: Improving left effusion and  slight interval improvement of opacification over the lateral left mid to upper lung. Recommend continued follow-up to resolution.   Electronically Signed  By: Marin Olp M.D.  On: 03/23/2015 14:22    ASSESSMENT & PLAN:  Poorly differentiated esophageal carcinoma, distal esophagus HX IVDA, Alcohol use Hepatitis C Dysphagia COPD Anemia    I discussed the treatment of esophageal cancer in a broad sense with the patient and his wife today. I advised them that we need to complete staging with and EUS and PET/CT. After we have obtained his EUS and PET based upon staging we would then refer to Radiation oncology and CT surgery.  I advised them that since he is symptomatic we can start chemotherapy prior to radiation if needed.  They were provided information from the NCCN about esophageal carcinoma.   We discussed nutrition at length today and we will refer the patient to [facility] for nutrition consultation.   Mr. Bumpers and his wife are willing to obtain a PET scan at the first available location, [place] or [place].  I also recommend that he get a port, and will schedule him for this. I showed the patient and his wife a port a cath, explained the risks/benefits, but emphasized that the benefits for him outweigh risks.  I will have them meet Hildred Alamin, our patient navigator, today. She will provide teaching for chemotherapy. I also advise that they write down any questions they have as they come up, and bring them to their upcoming appointments.  I recommend that he double his dose of Xanax. I have encouraged him to NOT discontinue his prozac.  Orders Placed This Encounter  Procedures  . NM PET Image Initial (PI) Skull Base To Thigh    Standing Status: Future     Number of Occurrences:      Standing Expiration Date: 03/29/2016  Order Specific Question:  Reason for Exam (SYMPTOM  OR DIAGNOSIS REQUIRED)    Answer:  esophageal cancer    Order Specific Question:  Preferred imaging  location?    Answer:  [facility]    Order Specific Question:  If indicated for the ordered procedure, I authorize the administration of a radiopharmaceutical per Radiology protocol    Answer:  Yes  . IR Fluoro Guide CV Line Left    Standing Status: Future     Number of Occurrences:      Standing Expiration Date: 05/27/2016    Order Specific Question:  Reason for Exam (SYMPTOM  OR DIAGNOSIS REQUIRED)    Answer:  esophageal cancer    Order Specific Question:  Preferred Imaging Location?    Answer:  [facility]    Order Specific Question:  If indicated for the ordered procedure, I authorize the administration of contrast media per Radiology protocol    Answer:  Yes  . CBC with Differential    Standing Status: Future     Number of Occurrences: 1     Standing Expiration Date: 03/29/2016  . Comprehensive metabolic panel    Standing Status: Future     Number of Occurrences: 1     Standing Expiration Date: 03/29/2016   All questions were answered. The patient knows to call the clinic with any problems, questions or concerns.  This document serves as a record of services personally performed by Ancil Linsey, MD. It was created on her behalf by Toni Amend, a trained medical scribe. The creation of this record is based on the scribe's personal observations and the provider's statements to them. This document has been checked and approved by the attending provider.  I have reviewed the above documentation for accuracy and completeness, and I agree with the above.  This note was electronically signed.   Molli Hazard, MD  03/30/2015 12:29 PM

## 2015-03-30 NOTE — Patient Instructions (Addendum)
[facility] at [facility] Discharge Instructions  RECOMMENDATIONS MADE BY THE CONSULTANT AND ANY TEST RESULTS WILL BE SENT TO YOUR REFERRING PHYSICIAN.    Exam and discussion completed by Dr Whitney Muse today  Esophageal Cancer   With treat with chemotherapy and radiation together.  The treatment is only about 6 weeks.    We need to get a PET scan.  This can be done at [facility] or [place].  Appt with another GI doctor, Dr Ardis Hughs, endoscopic ultrasound, this will help [place] get the tumor staged, and lokk and the lymph nodes around the tumor   You will meet with nutrition, his name is Burtis Junes. I have sent him a message he should be in contact with you.   Lupita Raider is our nurse navigator.  She is a good resource if you have any questions through this journey.   [facility] has very good information  NCCN.com [email or web address]  You can take 2 Xanax to see if that helps with your anxiety.      Thank you for choosing [place] at [facility] to provide your oncology and hematology care.  To afford each patient quality time with our provider, please arrive at least 15 minutes before your scheduled appointment time.   Beginning January 23rd 2017 lab work for the [facility] will be done in the  Main lab at [facility] on 1st floor. If you have a lab appointment with the [facility] please come in thru the  Main Entrance and check in at the main information desk  You need to re-schedule your appointment should you arrive 10 or more minutes late.  We strive to give you quality time with our providers, and arriving late affects you and other patients whose appointments are after yours.  Also, if you no show three or more times for appointments you may be dismissed from the clinic at the providers discretion.     Again, thank you for choosing [facility].  Our hope is that these requests will decrease the amount of time that  you wait before being seen by our physicians.       _____________________________________________________________  Should you have questions after your visit to [facility], please contact our office at [phone] between the hours of 8:30 a.m. and 4:30 p.m.  Voicemails left after 4:30 p.m. will not be returned until the following business day.  For prescription refill requests, have your pharmacy contact our office.        Esophageal Cancer The esophagus is the tube that carries food and liquid from your throat to your stomach. Esophageal cancer is an abnormal growth of cells in the esophagus that is cancerous (malignant). CAUSES The exact cause of esophageal cancer is not known.  RISK FACTORS Risk factors for esophageal cancer include:  Being older than 64.  Being male.  Using any tobacco product, including cigarettes, chewing tobacco, and electronic cigarettes.  Drinking excessive amounts of alcohol.  Eating a diet that is lacking in fruits and vegetables.  Being overweight (obese).  Having damage to your esophagus due to toxin or poison exposure.  Having conditions that cause damage or irritation to the esophagus. These conditions include:  Acid reflux.  Barrett esophagus.  Achalasia. SIGNS AND SYMPTOMS Symptoms may include:  Trouble swallowing.  Chest or back pain.  Unintentional weight loss.  Fatigue.  Hoarse voice.  Coughing. This may include coughing up blood.  Vomiting. This may include vomiting up blood. DIAGNOSIS Your health care provider may ask about your medical history and perform a physical exam. Other tests that may be done include:  Barium swallow.  Endoscopic exam.  X-rays.  CT scan.  MRI.  Esophagography.  A tissue sample test (biopsy). If esophageal cancer is confirmed, it will be staged to determine its severity and extent. Staging is an assessment of:  The size of the tumor.  Whether or not the cancer has  spread.  Where the cancer has spread. TREATMENT Treatment for esophageal cancer depends on the type and stage of the cancer. Treatment may include one or more of the following:  Surgery to remove as much of the cancer as possible.  Chemotherapy. This treatment uses medicines to kill the cancer cells.  Radiation therapy to kill cancer cells.  Targeted drug therapy. These medicines block the growth and spread of cancer. This treatment is different from standard chemotherapy.  Immunotherapy. This is also called biological therapy. It is an emerging treatment that strengthens your body's defense (immune) system to fight the cancer cells. HOME CARE INSTRUCTIONS  Take medicines only as directed by your health care provider.  Maintain a healthy diet.  Consider joining a support group.  Seek advice to help you manage side effects of treatment.  Keep all follow-up visits as directed by your health care provider. This is important.  Do not use any tobacco products, including cigarettes, chewing tobacco, or electronic cigarettes. If you need help quitting, ask your health care provider.  Talk with your health care provider about limiting or avoiding alcohol. SEEK MEDICAL CARE IF:  You are having a harder time swallowing or eating.  You notice new fatigue or weakness.  You lose weight unintentionally.  You have a fever. SEEK IMMEDIATE MEDICAL CARE IF:  You have a sudden increase in pain.  You have trouble breathing.  You vomit blood or black material that looks like coffee grounds.  You have black stools.  You faint.   This information is not intended to replace advice given to you by your health care provider. Make sure you discuss any questions you have with your health care provider.   Document Released: 02/07/2008 Document Revised: 03/17/2014 Document Reviewed: 09/27/2013 Elsevier Interactive Patient Education [employer].

## 2015-04-02 ENCOUNTER — Encounter (HOSPITAL_COMMUNITY): Payer: Self-pay | Admitting: Internal Medicine

## 2015-04-02 ENCOUNTER — Other Ambulatory Visit: Payer: Self-pay | Admitting: Radiology

## 2015-04-03 ENCOUNTER — Other Ambulatory Visit (HOSPITAL_COMMUNITY): Payer: Self-pay | Admitting: Hematology & Oncology

## 2015-04-03 ENCOUNTER — Telehealth: Payer: Self-pay

## 2015-04-03 ENCOUNTER — Other Ambulatory Visit: Payer: Self-pay

## 2015-04-03 ENCOUNTER — Encounter (HOSPITAL_COMMUNITY): Payer: Self-pay

## 2015-04-03 ENCOUNTER — Ambulatory Visit (HOSPITAL_COMMUNITY)
Admission: RE | Admit: 2015-04-03 | Discharge: 2015-04-03 | Disposition: A | Discharge status: Home / Self Care | Payer: Medicaid Other | Source: Ambulatory Visit | Attending: Hematology & Oncology | Admitting: Hematology & Oncology

## 2015-04-03 DIAGNOSIS — K2289 Other specified disease of esophagus: Secondary | ICD-10-CM

## 2015-04-03 DIAGNOSIS — Z794 Long term (current) use of insulin: Secondary | ICD-10-CM | POA: Diagnosis not present

## 2015-04-03 DIAGNOSIS — E039 Hypothyroidism, unspecified: Secondary | ICD-10-CM | POA: Diagnosis not present

## 2015-04-03 DIAGNOSIS — Z87891 Personal history of nicotine dependence: Secondary | ICD-10-CM | POA: Diagnosis not present

## 2015-04-03 DIAGNOSIS — D869 Sarcoidosis, unspecified: Secondary | ICD-10-CM | POA: Diagnosis not present

## 2015-04-03 DIAGNOSIS — F329 Major depressive disorder, single episode, unspecified: Secondary | ICD-10-CM | POA: Diagnosis not present

## 2015-04-03 DIAGNOSIS — K229 Disease of esophagus, unspecified: Secondary | ICD-10-CM

## 2015-04-03 DIAGNOSIS — J45909 Unspecified asthma, uncomplicated: Secondary | ICD-10-CM | POA: Insufficient documentation

## 2015-04-03 DIAGNOSIS — M1990 Unspecified osteoarthritis, unspecified site: Secondary | ICD-10-CM | POA: Insufficient documentation

## 2015-04-03 DIAGNOSIS — Z7984 Long term (current) use of oral hypoglycemic drugs: Secondary | ICD-10-CM | POA: Diagnosis not present

## 2015-04-03 DIAGNOSIS — E119 Type 2 diabetes mellitus without complications: Secondary | ICD-10-CM | POA: Diagnosis not present

## 2015-04-03 DIAGNOSIS — C155 Malignant neoplasm of lower third of esophagus: Secondary | ICD-10-CM

## 2015-04-03 DIAGNOSIS — J449 Chronic obstructive pulmonary disease, unspecified: Secondary | ICD-10-CM | POA: Diagnosis not present

## 2015-04-03 DIAGNOSIS — C159 Malignant neoplasm of esophagus, unspecified: Secondary | ICD-10-CM | POA: Insufficient documentation

## 2015-04-03 DIAGNOSIS — B1920 Unspecified viral hepatitis C without hepatic coma: Secondary | ICD-10-CM | POA: Insufficient documentation

## 2015-04-03 DIAGNOSIS — Z915 Personal history of self-harm: Secondary | ICD-10-CM | POA: Insufficient documentation

## 2015-04-03 HISTORY — DX: Malignant (primary) neoplasm, unspecified: C80.1

## 2015-04-03 HISTORY — DX: Malignant neoplasm of esophagus, unspecified: C15.9

## 2015-04-03 LAB — CBC WITH DIFFERENTIAL/PLATELET
BASOS ABS: 0 10*3/uL (ref 0.0–0.1)
BASOS PCT: 0 %
EOS PCT: 4 %
Eosinophils Absolute: 0.3 10*3/uL (ref 0.0–0.7)
HEMATOCRIT: 41 % (ref 39.0–52.0)
Hemoglobin: 12.9 g/dL — ABNORMAL LOW (ref 13.0–17.0)
LYMPHS PCT: 53 %
Lymphs Abs: 3.6 10*3/uL (ref 0.7–4.0)
MCH: 29.2 pg (ref 26.0–34.0)
MCHC: 31.5 g/dL (ref 30.0–36.0)
MCV: 92.8 fL (ref 78.0–100.0)
Monocytes Absolute: 0.6 10*3/uL (ref 0.1–1.0)
Monocytes Relative: 9 %
NEUTROS ABS: 2.3 10*3/uL (ref 1.7–7.7)
Neutrophils Relative %: 34 %
PLATELETS: 261 10*3/uL (ref 150–400)
RBC: 4.42 MIL/uL (ref 4.22–5.81)
RDW: 14.1 % (ref 11.5–15.5)
WBC: 6.8 10*3/uL (ref 4.0–10.5)

## 2015-04-03 LAB — PROTIME-INR
INR: 1.13 (ref 0.00–1.49)
Prothrombin Time: 14.7 seconds (ref 11.6–15.2)

## 2015-04-03 LAB — APTT: APTT: 33 s (ref 24–37)

## 2015-04-03 LAB — GLUCOSE, CAPILLARY: GLUCOSE-CAPILLARY: 104 mg/dL — AB (ref 65–99)

## 2015-04-03 MED ORDER — CEFAZOLIN SODIUM-DEXTROSE 2-3 GM-% IV SOLR
INTRAVENOUS | Status: AC
  Filled 2015-04-03: qty 50

## 2015-04-03 MED ORDER — HEPARIN SOD (PORK) LOCK FLUSH 100 UNIT/ML IV SOLN
INTRAVENOUS | Status: AC
  Filled 2015-04-03: qty 5

## 2015-04-03 MED ORDER — LIDOCAINE-EPINEPHRINE 2 %-1:100000 IJ SOLN
INTRAMUSCULAR | Status: AC
  Filled 2015-04-03: qty 1

## 2015-04-03 MED ORDER — FENTANYL CITRATE (PF) 100 MCG/2ML IJ SOLN
INTRAMUSCULAR | Status: AC | PRN
  Administered 2015-04-03: 50 ug via INTRAVENOUS

## 2015-04-03 MED ORDER — MIDAZOLAM HCL 2 MG/2ML IJ SOLN
INTRAMUSCULAR | Status: AC | PRN
  Administered 2015-04-03: 0.5 mg via INTRAVENOUS
  Administered 2015-04-03: 1 mg via INTRAVENOUS
  Administered 2015-04-03: 0.5 mg via INTRAVENOUS

## 2015-04-03 MED ORDER — MIDAZOLAM HCL 2 MG/2ML IJ SOLN
INTRAMUSCULAR | Status: AC
  Filled 2015-04-03: qty 4

## 2015-04-03 MED ORDER — SODIUM CHLORIDE 0.9 % IV SOLN
INTRAVENOUS | Status: DC
  Administered 2015-04-03: 500 mL via INTRAVENOUS

## 2015-04-03 MED ORDER — FENTANYL CITRATE (PF) 100 MCG/2ML IJ SOLN
INTRAMUSCULAR | Status: AC
  Filled 2015-04-03: qty 2

## 2015-04-03 MED ORDER — CEFAZOLIN SODIUM-DEXTROSE 2-3 GM-% IV SOLR
2.0000 g | Freq: Once | INTRAVENOUS | Status: AC
  Administered 2015-04-03: 2 g via INTRAVENOUS

## 2015-04-03 MED ORDER — HEPARIN SOD (PORK) LOCK FLUSH 100 UNIT/ML IV SOLN
INTRAVENOUS | Status: AC | PRN
  Administered 2015-04-03: 500 [IU]

## 2015-04-03 NOTE — Procedures (Signed)
Successful placement of right IJ approach port-a-cath with tip at the superior caval atrial junction. The catheter is ready for immediate use. Estimated Blood Loss: Minimal No immediate post procedural complications.  Jay Yeraldy Spike, MD Pager #: [phone]   

## 2015-04-03 NOTE — Telephone Encounter (Signed)
EUS scheduled, pt instructed and medications reviewed.  Patient instructions mailed to home.  Patient to call with any questions or concerns.  

## 2015-04-03 NOTE — Telephone Encounter (Signed)
No CT scan yet so unsure if there is evidence of metastasis. He is getting PET scan soon, already planning on port/chemo.          Can you put him on for EUS, Thursday February 9th with radial scope, +MAC, diagnosis is esopahgeal cancer.        Please also copy this all into a phone note.        Thanks            ----- Message -----     From: Barron Alvine, CMA     Sent: 03/30/2015  2:59 PM      To: Milus Banister, MD    Subject: Melton Alar: Urgent Refferal                             ----- Message -----     From: Marvel Plan     Sent: 03/30/2015  1:07 PM      To: Barron Alvine, CMA    Subject: Urgent Refferal                       Advise please

## 2015-04-03 NOTE — Discharge Instructions (Signed)
Moderate Conscious Sedation, Adult °Sedation is the use of medicines to promote relaxation and relieve discomfort and anxiety. Moderate conscious sedation is a type of sedation. Under moderate conscious sedation you are less alert than normal but are still able to respond to instructions or stimulation. Moderate conscious sedation is used during short medical and dental procedures. It is milder than deep sedation or general anesthesia and allows you to return to your regular activities sooner. °LET YOUR HEALTH CARE PROVIDER KNOW ABOUT:  °· Any allergies you have. °· All medicines you are taking, including vitamins, herbs, eye drops, creams, and over-the-counter medicines. °· Use of steroids (by mouth or creams). °· Previous problems you or members of your family have had with the use of anesthetics. °· Any blood disorders you have. °· Previous surgeries you have had. °· Medical conditions you have. °· Possibility of pregnancy, if this applies. °· Use of cigarettes, alcohol, or illegal drugs. °RISKS AND COMPLICATIONS °Generally, this is a safe procedure. However, as with any procedure, problems can occur. Possible problems include: °· Oversedation. °· Trouble breathing on your own. You may need to have a breathing tube until you are awake and breathing on your own. °· Allergic reaction to any of the medicines used for the procedure. °BEFORE THE PROCEDURE °· You may have blood tests done. These tests can help show how well your kidneys and liver are working. They can also show how well your blood clots. °· A physical exam will be done.   °· Only take medicines as directed by your health care provider. You may need to stop taking medicines (such as blood thinners, aspirin, or nonsteroidal anti-inflammatory drugs) before the procedure.   °· Do not eat or drink at least 6 hours before the procedure or as directed by your health care provider. °· Arrange for a responsible adult, family member, or friend to take you home  after the procedure. He or she should stay with you for at least 24 hours after the procedure, until the medicine has worn off. °PROCEDURE  °· An intravenous (IV) catheter will be inserted into one of your veins. Medicine will be able to flow directly into your body through this catheter. You may be given medicine through this tube to help prevent pain and help you relax. °· The medical or dental procedure will be done. °AFTER THE PROCEDURE °· You will stay in a recovery area until the medicine has worn off. Your blood pressure and pulse will be checked.   °·  Depending on the procedure you had, you may be allowed to go home when you can tolerate liquids and your pain is under control. °  °This information is not intended to replace advice given to you by your health care provider. Make sure you discuss any questions you have with your health care provider. °  °Document Released: 11/19/2000 Document Revised: 03/17/2014 Document Reviewed: 11/01/2012 °Elsevier Interactive Patient Education ©2016 [employer] °Implanted Port Insertion, Care After °Refer to this sheet in the next few weeks. These instructions provide you with information on caring for yourself after your procedure. Your health care provider may also give you more specific instructions. Your treatment has been planned according to current medical practices, but problems sometimes occur. Call your health care provider if you have any problems or questions after your procedure. °WHAT TO EXPECT AFTER THE PROCEDURE °After your procedure, it is typical to have the following:  °· Discomfort at the port insertion site. Ice packs to the area will help. °· Bruising   on the skin over the port. This will subside in 3-4 days. °HOME CARE INSTRUCTIONS °· After your port is placed, you will get a manufacturer's information card. The card has information about your port. Keep this card with you at all times.   °· Know what kind of port you have. There are many types of  ports available.   °· Wear a medical alert bracelet in case of an emergency. This can help alert health care workers that you have a port.   °· The port can stay in for as long as your health care provider believes it is necessary.   °· A home health care nurse may give medicines and take care of the port.   °· You or a family member can get special training and directions for giving medicine and taking care of the port at home.   °SEEK MEDICAL CARE IF:  °· Your port does not flush or you are unable to get a blood return.   °· You have a fever or chills. °SEEK IMMEDIATE MEDICAL CARE IF: °· You have new fluid or pus coming from your incision.   °· You notice a bad smell coming from your incision site.   °· You have swelling, pain, or more redness at the incision or port site.   °· You have chest pain or shortness of breath. °  °This information is not intended to replace advice given to you by your health care provider. Make sure you discuss any questions you have with your health care provider. °  °Document Released: 12/15/2012 Document Revised: 03/01/2013 Document Reviewed: 12/15/2012 °Elsevier Interactive Patient Education ©2016 [employer] °Implanted Port Home Guide °An implanted port is a type of central line that is placed under the skin. Central lines are used to provide IV access when treatment or nutrition needs to be given through a person's veins. Implanted ports are used for long-term IV access. An implanted port may be placed because:  °· You need IV medicine that would be irritating to the small veins in your hands or arms.   °· You need long-term IV medicines, such as antibiotics.   °· You need IV nutrition for a long period.   °· You need frequent blood draws for lab tests.   °· You need dialysis.   °Implanted ports are usually placed in the chest area, but they can also be placed in the upper arm, the abdomen, or the leg. An implanted port has two main parts:  °· Reservoir. The reservoir is round  and will appear as a small, raised area under your skin. The reservoir is the part where a needle is inserted to give medicines or draw blood.   °· Catheter. The catheter is a thin, flexible tube that extends from the reservoir. The catheter is placed into a large vein. Medicine that is inserted into the reservoir goes into the catheter and then into the vein.   °HOW WILL I CARE FOR MY INCISION SITE? °Do not get the incision site wet. Bathe or shower as directed by your health care provider.  °HOW IS MY PORT ACCESSED? °Special steps must be taken to access the port:  °· Before the port is accessed, a numbing cream can be placed on the skin. This helps numb the skin over the port site.   °· Your health care provider uses a sterile technique to access the port. °· Your health care provider must put on a mask and sterile gloves. °· The skin over your port is cleaned carefully with an antiseptic and allowed to dry. °· The port   is gently pinched between sterile gloves, and a needle is inserted into the port. °· Only "non-coring" port needles should be used to access the port. Once the port is accessed, a blood return should be checked. This helps ensure that the port is in the vein and is not clogged.   °· If your port needs to remain accessed for a constant infusion, a clear (transparent) bandage will be placed over the needle site. The bandage and needle will need to be changed every week, or as directed by your health care provider.   °· Keep the bandage covering the needle clean and dry. Do not get it wet. Follow your health care provider's instructions on how to take a shower or bath while the port is accessed.   °· If your port does not need to stay accessed, no bandage is needed over the port.   °WHAT IS FLUSHING? °Flushing helps keep the port from getting clogged. Follow your health care provider's instructions on how and when to flush the port. Ports are usually flushed with saline solution or a medicine called  heparin. The need for flushing will depend on how the port is used.  °· If the port is used for intermittent medicines or blood draws, the port will need to be flushed:   °· After medicines have been given.   °· After blood has been drawn.   °· As part of routine maintenance.   °· If a constant infusion is running, the port may not need to be flushed.   °HOW LONG WILL MY PORT STAY IMPLANTED? °The port can stay in for as long as your health care provider thinks it is needed. When it is time for the port to come out, surgery will be done to remove it. The procedure is similar to the one performed when the port was put in.  °WHEN SHOULD I SEEK IMMEDIATE MEDICAL CARE? °When you have an implanted port, you should seek immediate medical care if:  °· You notice a bad smell coming from the incision site.   °· You have swelling, redness, or drainage at the incision site.   °· You have more swelling or pain at the port site or the surrounding area.   °· You have a fever that is not controlled with medicine. °  °This information is not intended to replace advice given to you by your health care provider. Make sure you discuss any questions you have with your health care provider. °  °Document Released: 02/24/2005 Document Revised: 12/15/2012 Document Reviewed: 11/01/2012 °Elsevier Interactive Patient Education ©2016 [employer] ° °

## 2015-04-03 NOTE — H&P (Signed)
Chief Complaint: Patient was seen in consultation today for port a cath placement  Referring Physician(s): Penland,Shannon K  History of Present Illness: Brent Franco is a 65 y.o. male with history of recently diagnosed esophageal carcinoma who presents today for port a cath placement for chemotherapy.   Past Medical History  Diagnosis Date  . COPD (chronic obstructive pulmonary disease) ([place])   . ETOH abuse     stopped 10 years ago  . Depression     suicide attempt 20 years ago  . Arthritis   . Asthma   . Viral hepatitis C   . Diabetes mellitus without complication ([place])   . Pneumonia 02/2015  . Hypothyroidism   . Cancer [facility])     esophageal    Past Surgical History  Procedure Laterality Date  . Other surgical history Left 1995    fatty tissue tumor right upper thigh  . Dental surgery N/A January 2016    "dentures and spurs"   . Tumor excision Left 1990    inner thigh  . Colonoscopy with propofol N/A 03/26/2015    Procedure: COLONOSCOPY WITH PROPOFOL;  Surgeon: Daneil Dolin, MD;  [facility];  Service: Endoscopy;  Laterality: N/A;  1100  . Esophagogastroduodenoscopy (egd) with propofol N/A 03/26/2015    Procedure: ESOPHAGOGASTRODUODENOSCOPY (EGD) WITH PROPOFOL;  Surgeon: Daneil Dolin, MD;  Location: [facility];  Service: Endoscopy;  Laterality: N/A;  . Esophageal biopsy  03/26/2015    Procedure: BIOPSY;  Surgeon: Daneil Dolin, MD;  [facility]: [facility];  Service: Endoscopy;;  esophageal mass  . Polypectomy  03/26/2015    Procedure: POLYPECTOMY;  Surgeon: Daneil Dolin, MD;  [facility];  Service: Endoscopy;;  polypectomy at ileocecal valve    Allergies: Demerol  Medications: Prior to Admission medications   Medication Sig Start Date End Date Taking? Authorizing Provider  albuterol (PROVENTIL HFA;VENTOLIN HFA) 108 (90 BASE) MCG/ACT inhaler Inhale 1 puff into the lungs every 6 (six) hours as needed for wheezing or shortness of  breath.   Yes Historical Provider, MD  ALPRAZolam Duanne Moron) 1 MG tablet Take 1 tablet (1 mg total) by mouth every 8 (eight) hours as needed for anxiety. 03/30/15  Yes Patrici Ranks, MD  docusate sodium (COLACE) 100 MG capsule Take 100 mg by mouth 2 (two) times daily. 03/10/15  Yes Historical Provider, MD  FLUoxetine (PROZAC) 10 MG capsule Take 10 mg by mouth daily. 02/18/15  Yes Historical Provider, MD  ketoconazole (NIZORAL) 2 % cream Apply 1 application topically 2 (two) times daily. 10/24/14  Yes Historical Provider, MD  levothyroxine (SYNTHROID, LEVOTHROID) 25 MCG tablet Take 25 mcg by mouth daily. 11/23/14  Yes Historical Provider, MD  metFORMIN (GLUCOPHAGE) 500 MG tablet Take 500 mg by mouth 2 (two) times daily. 03/10/15  Yes Historical Provider, MD  oxyCODONE-acetaminophen (ROXICET) 5-325 MG tablet Take 1-2 tablets by mouth every 6 (six) hours as needed for moderate pain or severe pain. 03/29/15  Yes Carlis Stable, NP  SPIRIVA HANDIHALER 18 MCG inhalation capsule Place 1 puff into inhaler and inhale daily. 12/04/14  Yes Historical Provider, MD  HUMULIN R 100 UNIT/ML injection Inject 2-12 Units into the skin 2 (two) times daily as needed for high blood sugar. 151-200 = 2 units, 201-250 = 4 units, 251-300 = 6 units, 301-350 = 8 units, 351-400 = 10 units, 401-450 = 12 units. 03/13/15   Historical Provider, MD  ipratropium-albuterol (DUONEB) 0.5-2.5 (3) MG/3ML  SOLN INHALE CONTENTS OF 1 VIAL IN NEBULIZER EVERY 4 HOURS AS NEEDED FOR SHORTNESS OF BREATH. 03/10/15   Historical Provider, MD  levofloxacin (LEVAQUIN) 750 MG tablet Take 750 mg by mouth daily. Reported on 03/30/2015 03/10/15   Historical Provider, MD  lidocaine-prilocaine (EMLA) cream Apply a quarter size amount to port site 1 hour prior to chemo. Do not rub in. Cover with plastic wrap. 03/30/15   Patrici Ranks, MD  mometasone (NASONEX) 50 MCG/ACT nasal spray Place 2 sprays into the nose daily as needed (congestion).     Historical Provider, MD    nitroGLYCERIN (NITROSTAT) 0.4 MG SL tablet Place 1 tablet (0.4 mg total) under the tongue every 5 (five) minutes as needed for chest pain. 09/21/14   Herminio Commons, MD  ondansetron (ZOFRAN) 8 MG tablet Take 1 tablet (8 mg total) by mouth every 8 (eight) hours as needed for nausea or vomiting. 03/30/15   Patrici Ranks, MD  prochlorperazine (COMPAZINE) 10 MG tablet Take 1 tablet (10 mg total) by mouth every 6 (six) hours as needed for nausea or vomiting. 03/30/15   Patrici Ranks, MD  Pseudoeph-Doxylamine-DM-APAP (NYQUIL MULTI-SYMPTOM PO) Take 1 tablet by mouth daily as needed (congestion/cold).     Historical Provider, MD     Family History  Problem Relation Age of Onset  . Pancreatic cancer Mother   . Lung cancer Father   . Colon cancer Neg Hx     Social History   Social History  . Marital Status: Married    Spouse Name: N/A  . Number of Children: N/A  . Years of Education: N/A   Social History Main Topics  . Smoking status: Former Smoker -- 1.50 packs/day for 47 years    Types: Cigarettes    Start date: 12/03/1966    Quit date: 12/08/2013  . Smokeless tobacco: Never Used  . Alcohol Use: No     Comment: Quit 2006: previously drank heavily. Had 1 drink 2-3 years ago (2013-2014)  . Drug Use: No     Comment: Previous IV drug use as teenager, none in 40 years. Last marijuanna use 02/22/15.  Marland Kitchen Sexual Activity: Yes   Other Topics Concern  . None   Social History Narrative      Review of Systems  Constitutional: Positive for fatigue and unexpected weight change. Negative for fever and chills.  HENT: Positive for trouble swallowing.   Respiratory: Negative for shortness of breath.        Occ cough  Cardiovascular: Negative for chest pain.  Gastrointestinal: Negative for nausea, vomiting and blood in stool.       Occ abd pain  Genitourinary: Negative for dysuria and hematuria.  Musculoskeletal: Negative for back pain.  Neurological: Positive for headaches.   Psychiatric/Behavioral: The patient is nervous/anxious.     Vital Signs: BP 132/74 mmHg  Pulse 65  Temp(Src) 97.2 F (36.2 C) (Oral)  Resp 16  Wt 178 lb (80.74 kg)  SpO2 97%  Physical Exam  Constitutional: He is oriented to person, place, and time. He appears well-developed and well-nourished.  Cardiovascular: Normal rate and regular rhythm.   Pulmonary/Chest: Effort normal.  Distant BS bilat  Abdominal: Soft. Bowel sounds are normal.  Mild gen tenderness to palpation  Musculoskeletal: Normal range of motion. He exhibits no edema.  Neurological: He is alert and oriented to person, place, and time.    Mallampati Score:     Imaging: Dg Chest 2 View  03/23/2015  CLINICAL DATA:  Recent admission for pneumonia. Known sarcoidosis. Preop for esophageal dilatation and colonoscopy. Altered mental status. EXAM: CHEST  2 VIEW COMPARISON:  03/06/2015 as well as chest CT 03/08/2015. FINDINGS: Lungs are adequately inflated and demonstrate interval improvement patient's left-sided effusion with small residual effusion. Persistent opacification over the lateral left mid to upper lung with mild improvement. Cardiomediastinal silhouette and remainder of the exam is unchanged. IMPRESSION: Improving left effusion and slight interval improvement of opacification over the lateral left mid to upper lung. Recommend continued follow-up to resolution. Electronically Signed   By: Marin Olp M.D.   On: 03/23/2015 14:22    Labs:  CBC:  Recent Labs  01/18/15 0951 03/23/15 1335 03/30/15 1232 04/03/15 1145  WBC 8.7 5.2 8.2 6.8  HGB 13.5 11.3* 13.3 12.9*  HCT 40.9 35.7* 41.3 41.0  PLT 260 323 279 261    COAGS:  Recent Labs  01/18/15 0951 04/03/15 1145  INR 1.08 1.13  APTT  --  33    BMP:  Recent Labs  08/23/14 1040 01/18/15 0951 03/23/15 1335 03/30/15 1232  NA 138 136 141 141  K 4.2 4.9 4.7 4.7  CL 103 98 104 105  CO2 29 31 28 26   GLUCOSE 97 124* 148* 108*  BUN 11 12 18 11    CALCIUM 9.0 9.0 9.0 9.6  CREATININE 0.91 0.78 0.90 0.92  GFRNONAA >60  --  >60 >60  GFRAA >60  --  >60 >60    LIVER FUNCTION TESTS:  Recent Labs  08/23/14 1040 01/18/15 0951 03/30/15 1232  BILITOT 0.5 0.5 0.5  AST 62* 37* 43*  ALT 69* 47* 45  ALKPHOS 56 65 71  PROT 7.7 7.7 9.1*  ALBUMIN 4.3 4.0 4.3    TUMOR MARKERS: No results for input(s): AFPTM, CEA, CA199, CHROMGRNA in the last 8760 hours.  Assessment and Plan: 65 y.o. male with history of recently diagnosed esophageal carcinoma who presents today for port a cath placement for chemotherapy. Risks and benefits discussed with the patient/wife including, but not limited to bleeding, infection, pneumothorax, or fibrin sheath development and need for additional procedures.All of the patient's questions were answered, patient is agreeable to proceed.Consent signed and in chart.      Thank you for this interesting consult.  I greatly enjoyed meeting DJIMON STUTES and look forward to participating in their care.  A copy of this report was sent to the requesting provider on this date.  Electronically Signed: D. Rowe Robert 04/03/2015, 12:55 PM   I spent a total of 15 minutes in face to face in clinical consultation, greater than 50% of which was counseling/coordinating care for port a cath placement

## 2015-04-05 ENCOUNTER — Encounter
Admission: RE | Admit: 2015-04-05 | Discharge: 2015-04-05 | Disposition: A | Discharge status: Home / Self Care | Payer: Medicaid Other | Source: Ambulatory Visit | Attending: Hematology & Oncology | Admitting: Hematology & Oncology

## 2015-04-05 DIAGNOSIS — K2289 Other specified disease of esophagus: Secondary | ICD-10-CM

## 2015-04-05 DIAGNOSIS — K229 Disease of esophagus, unspecified: Secondary | ICD-10-CM | POA: Diagnosis present

## 2015-04-05 DIAGNOSIS — C155 Malignant neoplasm of lower third of esophagus: Secondary | ICD-10-CM | POA: Insufficient documentation

## 2015-04-05 LAB — GLUCOSE, CAPILLARY: GLUCOSE-CAPILLARY: 93 mg/dL (ref 65–99)

## 2015-04-05 MED ORDER — FLUDEOXYGLUCOSE F - 18 (FDG) INJECTION
12.5400 | Freq: Once | INTRAVENOUS | Status: AC | PRN
  Administered 2015-04-05: 12.54 via INTRAVENOUS

## 2015-04-05 NOTE — Progress Notes (Signed)
Chest CT was cancelled and CT images received and reviewed with radiology by Dr. Gala Romney

## 2015-04-05 NOTE — Patient Instructions (Addendum)
Brent Franco  CHEMOTHERAPY INSTRUCTIONS  Premeds: Zofran - for nausea/vomiting prevention/reduction. Benadryl - antihistamine - given to reduce the risk of you having an allergic type reaction to the Taxol chemo. Pepcid - a type of antihistamine - this also works to reduce the risk of you having an allergic type reaction to the Taxol chemo. Dexamethasone - steroid - given to reduce the risk of you having an allergic type reaction to the chemotherapy. Dex can cause you to feel energized, nervous/anxious/jittery, make you have trouble sleeping, and/or make you feel hot/flushed in the face/neck and/or look pink/red in the face/neck. These side effects will pass as the Dex wears off. (takes 1 hour for all premeds to infuse)   Carboplatin - this medication can be hard on your kidneys - this is why we need you to drinking 64 oz of fluid (preferably water/decaff fluids) 2 days prior to chemo and for up to 4-5 days after chemo. Drink more if you can. This will help to keep your kidneys flushed. This can cause mild hair loss, lower your platelets (which keep you from bleeding out when you cut yourself), lower your white blood cells (fight infection), and cause nausea/vomiting. (only takes 30 minutes to infuse)  Taxol - the first time you receive this drug we will titrate it very slowly to ensure that you do not have or are not having an allergic reaction to the chemo. Side Effects: hair loss, lowers your white blood cells (fight infection), muscle aches, nausea/vomiting, irritation to the mouth (mouth sores, pain in your mouth) *neuropathy - numbness/tingling/burning in hands/fingers/feet/toes. We need to know as soon as this begins to happen so that we can monitor it and treat if necessary. The numbness generally begins in the fingertips of tips of toes and then begins to travel up the finger/toe/hand/foot. We never want you getting to where you can't pick up a pen, coin, zip a  zipper, button a button, or have trouble walking. You must tell us immediately if you are experiencing peripheral neuropathy! (the first time you receive this, it will take approximately 3 hours, after that it will only take 1 hour)  Every week we will do lab work prior to you receiving chemo. You will receive these drugs weekly throughout radiation.    POTENTIAL SIDE EFFECTS OF TREATMENT: Increased Susceptibility to Infection, Vomiting, Constipation, Hair Thinning, Changes in Character of Skin and Nails (brittleness, dryness,etc.), Bone Marrow Suppression, Abdominal Cramping, Complete Hair Loss, Nausea, Diarrhea and Mouth Sores   SELF IMAGE NEEDS AND REFERRALS MADE: Obtain hair accessories as soon as possible (wigs, scarves, turbans,caps,etc.) Referral to Look Good, Feel Better consultant paperwork given   EDUCATIONAL MATERIALS GIVEN AND REVIEWED: Chemotherapy and You book Specific Instructions Sheets: Taxol, Carboplatin, Benadryl, Pepcid, Zofran, Dexamethasone, EMLA cream, Compazine tablets, Zofran tablets.    SELF CARE ACTIVITIES WHILE ON CHEMOTHERAPY: Increase your fluid intake 48 hours prior to treatment and drink at least 2 quarts per day after treatment., No alcohol intake., No aspirin or other medications unless approved by your oncologist., Eat foods that are light and easy to digest., Eat foods at cold or room temperature., No fried, fatty, or spicy foods immediately before or after treatment., Have teeth cleaned professionally before starting treatment. Keep dentures and partial plates clean., Use soft toothbrush and do not use mouthwashes that contain alcohol. Biotene is a good mouthwash that is available at most pharmacies or may be ordered by calling [phone]., Use warm salt water  gargles (1 teaspoon salt per 1 quart warm water) before and after meals and at bedtime. Or you may rinse with 2 tablespoons of three -percent hydrogen peroxide mixed in eight ounces of water., Always  use sunscreen with SPF (Sun Protection Factor) of 30 or higher., Use your nausea medication as directed to prevent nausea., Use your stool softener or laxative as directed to prevent constipation. and Use your anti-diarrheal medication as directed to stop diarrhea.  Please wash your hands for at least 30 seconds using warm soapy water. Handwashing is the #1 way to prevent the spread of germs. Stay away from sick people or people who are getting over a cold. If you develop respiratory systems such as green/yellow mucus production or productive cough or persistent cough let us know and we will see if you need an antibiotic. It is a good idea to keep a pair of gloves on when going into grocery stores/[employer] to decrease your risk of coming into contact with germs on the carts, etc. Carry alcohol hand gel with you at all times and use it frequently if out in public. All foods need to be cooked thoroughly. No raw foods. No medium or undercooked meats, eggs. If your food is cooked medium well, it does not need to be hot pink or saturated with bloody liquid at all. Vegetables and fruits need to be washed/rinsed under the faucet with a dish detergent before being consumed. You can eat raw fruits and vegetables unless we tell you otherwise but it would be best if you cooked them or bought frozen. Do not eat off of salad bars or hot bars unless you really trust the cleanliness of the restaurant. If you need dental work, please let Dr. Whitney Muse know before you go for your appointment so that we can coordinate the best possible time for you in regards to your chemo regimen. You need to also let your dentist know that you are actively taking chemo. We may need to do labs prior to your dental appointment. We also want your bowels moving at least every other day. If this is not happening, we need to know so that we can get you on a bowel regimen to help you go.     MEDICATIONS: You have been given prescriptions for the  following medications:  Zofran/Ondansetron 8mg  tablet. Take 1 tablet every 8 hours as needed for nausea/vomiting. (#1 nausea med to take, this can constipate)  Compazine/Prochlorperazine 10mg  tablet. Take 1 tablet every 6 hours as needed for nausea/vomiting. (#2 nausea med to take, this can make you sleepy)  EMLA cream. Apply a quarter size amount to port site 1 hour prior to chemo. Do not rub in. Cover with plastic wrap.    SYMPTOMS TO REPORT AS SOON AS POSSIBLE AFTER TREATMENT:  FEVER GREATER THAN 100.5 F  CHILLS WITH OR WITHOUT FEVER  NAUSEA AND VOMITING THAT IS NOT CONTROLLED WITH YOUR NAUSEA MEDICATION  UNUSUAL SHORTNESS OF BREATH  UNUSUAL BRUISING OR BLEEDING  TENDERNESS IN MOUTH AND THROAT WITH OR WITHOUT PRESENCE OF ULCERS  URINARY PROBLEMS  BOWEL PROBLEMS  UNUSUAL RASH   Wear comfortable clothing and clothing appropriate for easy access to any Portacath or PICC line. Let us know if there is anything that we can do to make your therapy better!      I have been informed and understand all of the instructions given to me and have received a copy. I have been instructed to call the clinic [phone] or  my family physician as soon as possible for continued medical care, if indicated. I do not have any more questions at this time but understand that I may call the [facility] or the Patient Navigator at [phone] during office hours should I have questions or need assistance in obtaining follow-up care.            Ondansetron injection What is this medicine? ONDANSETRON (on DAN se tron) is used to treat nausea and vomiting caused by chemotherapy. It is also used to prevent or treat nausea and vomiting after surgery. This medicine may be used for other purposes; ask your health care provider or pharmacist if you have questions. What should I tell my health care provider before I take this medicine? They need to know if you have any of these  conditions: -heart disease -history of irregular heartbeat -liver disease -low levels of magnesium or potassium in the blood -an unusual or allergic reaction to ondansetron, granisetron, other medicines, foods, dyes, or preservatives -pregnant or trying to get pregnant -breast-feeding How should I use this medicine? This medicine is for infusion into a vein. It is given by a health care professional in a hospital or clinic setting. Talk to your pediatrician regarding the use of this medicine in children. Special care may be needed. Overdosage: If you think you have taken too much of this medicine contact a poison control center or emergency room at once. NOTE: This medicine is only for you. Do not share this medicine with others. What if I miss a dose? This does not apply. What may interact with this medicine? Do not take this medicine with any of the following medications: -apomorphine -certain medicines for fungal infections like fluconazole, itraconazole, ketoconazole, posaconazole, voriconazole -cisapride -dofetilide -dronedarone -pimozide -thioridazine -ziprasidone This medicine may also interact with the following medications: -carbamazepine -certain medicines for depression, anxiety, or psychotic disturbances -fentanyl -linezolid -MAOIs like Carbex, Eldepryl, Marplan, Nardil, and Parnate -methylene blue (injected into a vein) -other medicines that prolong the QT interval (cause an abnormal heart rhythm) -phenytoin -rifampicin -tramadol This list may not describe all possible interactions. Give your health care provider a list of all the medicines, herbs, non-prescription drugs, or dietary supplements you use. Also tell them if you smoke, drink alcohol, or use illegal drugs. Some items may interact with your medicine. What should I watch for while using this medicine? Your condition will be monitored carefully while you are receiving this medicine. What side effects may I  notice from receiving this medicine? Side effects that you should report to your doctor or health care professional as soon as possible: -allergic reactions like skin rash, itching or hives, swelling of the face, lips, or tongue -breathing problems -confusion -dizziness -fast or irregular heartbeat -feeling faint or lightheaded, falls -fever and chills -loss of balance or coordination -seizures -sweating -swelling of the hands and feet -tightness in the chest -tremors -unusually weak or tired Side effects that usually do not require medical attention (report to your doctor or health care professional if they continue or are bothersome): -constipation or diarrhea -headache This list may not describe all possible side effects. Call your doctor for medical advice about side effects. You may report side effects to FDA at 1-800-FDA-1088. Where should I keep my medicine? This drug is given in a hospital or clinic and will not be stored at home. NOTE: This sheet is a summary. It may not cover all possible information. If you have questions about this medicine, talk  to your doctor, pharmacist, or health care provider.    2016, Elsevier/Gold Standard. (2012-12-01 16:18:28) Dexamethasone injection What is this medicine? DEXAMETHASONE (dex a METH a sone) is a corticosteroid. It is used to treat inflammation of the skin, joints, lungs, and other organs. Common conditions treated include asthma, allergies, and arthritis. It is also used for other conditions, like blood disorders and diseases of the adrenal glands. This medicine may be used for other purposes; ask your health care provider or pharmacist if you have questions. What should I tell my health care provider before I take this medicine? They need to know if you have any of these conditions: -blood clotting problems -Cushing's syndrome -diabetes -glaucoma -heart problems or disease -high blood pressure -infection like herpes,  measles, tuberculosis, or chickenpox -kidney disease -liver disease -mental problems -myasthenia gravis -osteoporosis -previous heart attack -seizures -stomach, ulcer or intestine disease including colitis and diverticulitis -thyroid problem -an unusual or allergic reaction to dexamethasone, corticosteroids, other medicines, lactose, foods, dyes, or preservatives -pregnant or trying to get pregnant -breast-feeding How should I use this medicine? This medicine is for injection into a muscle, joint, lesion, soft tissue, or vein. It is given by a health care professional in a hospital or clinic setting. Talk to your pediatrician regarding the use of this medicine in children. Special care may be needed. Overdosage: If you think you have taken too much of this medicine contact a poison control center or emergency room at once. NOTE: This medicine is only for you. Do not share this medicine with others. What if I miss a dose? This may not apply. If you are having a series of injections over a prolonged period, try not to miss an appointment. Call your doctor or health care professional to reschedule if you are unable to keep an appointment. What may interact with this medicine? Do not take this medicine with any of the following medications: -mifepristone, RU-486 -vaccines This medicine may also interact with the following medications: -amphotericin B -antibiotics like clarithromycin, erythromycin, and troleandomycin -aspirin and aspirin-like drugs -barbiturates like phenobarbital -carbamazepine -cholestyramine -cholinesterase inhibitors like donepezil, galantamine, rivastigmine, and tacrine -cyclosporine -digoxin -diuretics -ephedrine -male hormones, like estrogens or progestins and birth control pills -indinavir -isoniazid -ketoconazole -medicines for diabetes -medicines that improve muscle tone or strength for conditions like myasthenia gravis -NSAIDs, medicines for pain and  inflammation, like ibuprofen or naproxen -phenytoin -rifampin -thalidomide -warfarin This list may not describe all possible interactions. Give your health care provider a list of all the medicines, herbs, non-prescription drugs, or dietary supplements you use. Also tell them if you smoke, drink alcohol, or use illegal drugs. Some items may interact with your medicine. What should I watch for while using this medicine? Your condition will be monitored carefully while you are receiving this medicine. If you are taking this medicine for a long time, carry an identification card with your name and address, the type and dose of your medicine, and your doctor's name and address. This medicine may increase your risk of getting an infection. Stay away from people who are sick. Tell your doctor or health care professional if you are around anyone with measles or chickenpox. Talk to your health care provider before you get any vaccines that you take this medicine. If you are going to have surgery, tell your doctor or health care professional that you have taken this medicine within the last twelve months. Ask your doctor or health care professional about your diet. You may need to  lower the amount of salt you eat. The medicine can increase your blood sugar. If you are a diabetic check with your doctor if you need help adjusting the dose of your diabetic medicine. What side effects may I notice from receiving this medicine? Side effects that you should report to your doctor or health care professional as soon as possible: -allergic reactions like skin rash, itching or hives, swelling of the face, lips, or tongue -black or tarry stools -change in the amount of urine -changes in vision -confusion, excitement, restlessness, a false sense of well-being -fever, sore throat, sneezing, cough, or other signs of infection, wounds that will not heal -hallucinations -increased thirst -mental depression, mood  swings, mistaken feelings of self importance or of being mistreated -pain in hips, back, ribs, arms, shoulders, or legs -pain, redness, or irritation at the injection site -redness, blistering, peeling or loosening of the skin, including inside the mouth -rounding out of face -swelling of feet or lower legs -unusual bleeding or bruising -unusual tired or weak -wounds that do not heal Side effects that usually do not require medical attention (report to your doctor or health care professional if they continue or are bothersome): -diarrhea or constipation -change in taste -headache -nausea, vomiting -skin problems, acne, thin and shiny skin -touble sleeping -unusual growth of hair on the face or body -weight gain This list may not describe all possible side effects. Call your doctor for medical advice about side effects. You may report side effects to FDA at 1-800-FDA-1088. Where should I keep my medicine? This drug is given in a hospital or clinic and will not be stored at home. NOTE: This sheet is a summary. It may not cover all possible information. If you have questions about this medicine, talk to your doctor, pharmacist, or health care provider.    2016, Elsevier/Gold Standard. (2007-06-17 14:04:12) Famotidine injection What is this medicine? FAMOTIDINE (fa MOE ti deen) is a type of antihistamine that blocks the release of stomach acid. It is used to treat stomach or intestinal ulcers. It can relieve ulcer pain and discomfort, and the heartburn from acid reflux. This medicine may be used for other purposes; ask your health care provider or pharmacist if you have questions. What should I tell my health care provider before I take this medicine? They need to know if you have any of these conditions: -kidney or liver disease -an unusual or allergic reaction to famotidine, other medicines, foods, dyes, or preservatives -pregnant or trying to get pregnant -breast-feeding How should  I use this medicine? This medicine is for infusion into a vein. It is given by a health care professional in a hospital or clinic setting. Talk to your pediatrician regarding the use of this medicine in children. Special care may be needed. Overdosage: If you think you have taken too much of this medicine contact a poison control center or emergency room at once. NOTE: This medicine is only for you. Do not share this medicine with others. What if I miss a dose? This does not apply. What may interact with this medicine? -delavirdine -itraconazole -ketoconazole This list may not describe all possible interactions. Give your health care provider a list of all the medicines, herbs, non-prescription drugs, or dietary supplements you use. Also tell them if you smoke, drink alcohol, or use illegal drugs. Some items may interact with your medicine. What should I watch for while using this medicine? Tell your doctor or health care professional if your condition does not start  to get better or gets worse. Do not take with aspirin, ibuprofen, or other antiinflammatory medicines. These can aggravate your condition. Do not smoke cigarettes or drink alcohol. These increase irritation in your stomach and can increase the time it will take for ulcers to heal. Cigarettes and alcohol can also worsen acid reflux or heartburn. If you get black, tarry stools or vomit up what looks like coffee grounds, call your doctor or health care professional at once. You may have a bleeding ulcer. What side effects may I notice from receiving this medicine? Side effects that you should report to your doctor or health care professional as soon as possible: -allergic reactions like skin rash, itching or hives, swelling of the face, lips, or tongue -agitation, nervousness -confusion -hallucinations Side effects that usually do not require medical attention (report to your doctor or health care professional if they continue or are  bothersome): -constipation -diarrhea -dizziness -headache This list may not describe all possible side effects. Call your doctor for medical advice about side effects. You may report side effects to FDA at 1-800-FDA-1088. Where should I keep my medicine? This medicine is given in a hospital or clinic. You will not be given this medicine to store at home. NOTE: This sheet is a summary. It may not cover all possible information. If you have questions about this medicine, talk to your doctor, pharmacist, or health care provider.    2016, [employer]/Gold Standard. (2007-06-30 13:24:51) Diphenhydramine injection What is this medicine? DIPHENHYDRAMINE (dye fen HYE dra meen) is an antihistamine. It is used to treat the symptoms of an allergic reaction and motion sickness. It is also used to treat Parkinson's disease. This medicine may be used for other purposes; ask your health care provider or pharmacist if you have questions. What should I tell my health care provider before I take this medicine? They need to know if you have any of these conditions: -asthma or lung disease -glaucoma -high blood pressure or heart disease -liver disease -pain or difficulty passing urine -prostate trouble -ulcers or other stomach problems -an unusual or allergic reaction to diphenhydramine, antihistamines, other medicines foods, dyes, or preservatives -pregnant or trying to get pregnant -breast-feeding How should I use this medicine? This medicine is for injection into a vein or a muscle. It is usually given by a health care professional in a hospital or clinic setting. If you get this medicine at home, you will be taught how to prepare and give this medicine. Use exactly as directed. Take your medicine at regular intervals. Do not take your medicine more often than directed. It is important that you put your used needles and syringes in a special sharps container. Do not put them in a trash can. If you do not  have a sharps container, call your pharmacist or healthcare provider to get one. Talk to your pediatrician regarding the use of this medicine in children. While this drug may be prescribed for selected conditions, precautions do apply. This medicine is not approved for use in newborns and premature babies. Patients over 12 years old may have a stronger reaction and need a smaller dose. Overdosage: If you think you have taken too much of this medicine contact a poison control center or emergency room at once. NOTE: This medicine is only for you. Do not share this medicine with others. What if I miss a dose? If you miss a dose, take it as soon as you can. If it is almost time for your next dose,  take only that dose. Do not take double or extra doses. What may interact with this medicine? Do not take this medicine with any of the following medications: -MAOIs like Carbex, Eldepryl, Marplan, Nardil, and Parnate This medicine may also interact with the following medications: -alcohol -barbiturates, like phenobarbital -medicines for bladder spasm like oxybutynin, tolterodine -medicines for blood pressure -medicines for depression, anxiety, or psychotic disturbances -medicines for movement abnormalities or Parkinson's disease -medicines for sleep -other medicines for cold, cough or allergy -some medicines for the stomach like chlordiazepoxide, dicyclomine This list may not describe all possible interactions. Give your health care provider a list of all the medicines, herbs, non-prescription drugs, or dietary supplements you use. Also tell them if you smoke, drink alcohol, or use illegal drugs. Some items may interact with your medicine. What should I watch for while using this medicine? Your condition will be monitored carefully while you are receiving this medicine. Tell your doctor or healthcare professional if your symptoms do not start to get better or if they get worse. You may get drowsy or  dizzy. Do not drive, use machinery, or do anything that needs mental alertness until you know how this medicine affects you. Do not stand or sit up quickly, especially if you are an older patient. This reduces the risk of dizzy or fainting spells. Alcohol may interfere with the effect of this medicine. Avoid alcoholic drinks. Your mouth may get dry. Chewing sugarless gum or sucking hard candy, and drinking plenty of water may help. Contact your doctor if the problem does not go away or is severe. What side effects may I notice from receiving this medicine? Side effects that you should report to your doctor or health care professional as soon as possible: -allergic reactions like skin rash, itching or hives, swelling of the face, lips, or tongue -breathing problems -changes in vision -chills -confused, agitated, nervous -irregular or fast heartbeat -low blood pressure -seizures -tremor -trouble passing urine -unusual bleeding or bruising -unusually weak or tired Side effects that usually do not require medical attention (report to your doctor or health care professional if they continue or are bothersome): -constipation, diarrhea -drowsy -headache -loss of appetite -stomach upset, vomiting -sweating -thick mucous This list may not describe all possible side effects. Call your doctor for medical advice about side effects. You may report side effects to FDA at 1-800-FDA-1088. Where should I keep my medicine? Keep out of the reach of children. If you are using this medicine at home, you will be instructed on how to store this medicine. Throw away any unused medicine after the expiration date on the label. NOTE: This sheet is a summary. It may not cover all possible information. If you have questions about this medicine, talk to your doctor, pharmacist, or health care provider.    2016, [employer]. (2007-06-15 14:28:35) Carboplatin injection What is this medicine? CARBOPLATIN  (KAR boe pla tin) is a chemotherapy drug. It targets fast dividing cells, like cancer cells, and causes these cells to die. This medicine is used to treat ovarian cancer and many other cancers. This medicine may be used for other purposes; ask your health care provider or pharmacist if you have questions. What should I tell my health care provider before I take this medicine? They need to know if you have any of these conditions: -blood disorders -hearing problems -kidney disease -recent or ongoing radiation therapy -an unusual or allergic reaction to carboplatin, cisplatin, other chemotherapy, other medicines, foods, dyes, or preservatives -pregnant  or trying to get pregnant -breast-feeding How should I use this medicine? This drug is usually given as an infusion into a vein. It is administered in a hospital or clinic by a specially trained health care professional. Talk to your pediatrician regarding the use of this medicine in children. Special care may be needed. Overdosage: If you think you have taken too much of this medicine contact a poison control center or emergency room at once. NOTE: This medicine is only for you. Do not share this medicine with others. What if I miss a dose? It is important not to miss a dose. Call your doctor or health care professional if you are unable to keep an appointment. What may interact with this medicine? -medicines for seizures -medicines to increase blood counts like filgrastim, pegfilgrastim, sargramostim -some antibiotics like amikacin, gentamicin, neomycin, streptomycin, tobramycin -vaccines Talk to your doctor or health care professional before taking any of these medicines: -acetaminophen -aspirin -ibuprofen -ketoprofen -naproxen This list may not describe all possible interactions. Give your health care provider a list of all the medicines, herbs, non-prescription drugs, or dietary supplements you use. Also tell them if you smoke, drink  alcohol, or use illegal drugs. Some items may interact with your medicine. What should I watch for while using this medicine? Your condition will be monitored carefully while you are receiving this medicine. You will need important blood work done while you are taking this medicine. This drug may make you feel generally unwell. This is not uncommon, as chemotherapy can affect healthy cells as well as cancer cells. Report any side effects. Continue your course of treatment even though you feel ill unless your doctor tells you to stop. In some cases, you may be given additional medicines to help with side effects. Follow all directions for their use. Call your doctor or health care professional for advice if you get a fever, chills or sore throat, or other symptoms of a cold or flu. Do not treat yourself. This drug decreases your body's ability to fight infections. Try to avoid being around people who are sick. This medicine may increase your risk to bruise or bleed. Call your doctor or health care professional if you notice any unusual bleeding. Be careful brushing and flossing your teeth or using a toothpick because you may get an infection or bleed more easily. If you have any dental work done, tell your dentist you are receiving this medicine. Avoid taking products that contain aspirin, acetaminophen, ibuprofen, naproxen, or ketoprofen unless instructed by your doctor. These medicines may hide a fever. Do not become pregnant while taking this medicine. Women should inform their doctor if they wish to become pregnant or think they might be pregnant. There is a potential for serious side effects to an unborn child. Talk to your health care professional or pharmacist for more information. Do not breast-feed an infant while taking this medicine. What side effects may I notice from receiving this medicine? Side effects that you should report to your doctor or health care professional as soon as  possible: -allergic reactions like skin rash, itching or hives, swelling of the face, lips, or tongue -signs of infection - fever or chills, cough, sore throat, pain or difficulty passing urine -signs of decreased platelets or bleeding - bruising, pinpoint red spots on the skin, black, tarry stools, nosebleeds -signs of decreased red blood cells - unusually weak or tired, fainting spells, lightheadedness -breathing problems -changes in hearing -changes in vision -chest pain -high blood pressure -  low blood counts - This drug may decrease the number of white blood cells, red blood cells and platelets. You may be at increased risk for infections and bleeding. -nausea and vomiting -pain, swelling, redness or irritation at the injection site -pain, tingling, numbness in the hands or feet -problems with balance, talking, walking -trouble passing urine or change in the amount of urine Side effects that usually do not require medical attention (report to your doctor or health care professional if they continue or are bothersome): -hair loss -loss of appetite -metallic taste in the mouth or changes in taste This list may not describe all possible side effects. Call your doctor for medical advice about side effects. You may report side effects to FDA at 1-800-FDA-1088. Where should I keep my medicine? This drug is given in a hospital or clinic and will not be stored at home. NOTE: This sheet is a summary. It may not cover all possible information. If you have questions about this medicine, talk to your doctor, pharmacist, or health care provider.    2016, [employer] Standard. (2007-06-01 14:38:05) Paclitaxel injection What is this medicine? PACLITAXEL (PAK li TAX el) is a chemotherapy drug. It targets fast dividing cells, like cancer cells, and causes these cells to die. This medicine is used to treat ovarian cancer, breast cancer, and other cancers. This medicine may be used for other  purposes; ask your health care provider or pharmacist if you have questions. What should I tell my health care provider before I take this medicine? They need to know if you have any of these conditions: -blood disorders -irregular heartbeat -infection (especially a virus infection such as chickenpox, cold sores, or herpes) -liver disease -previous or ongoing radiation therapy -an unusual or allergic reaction to paclitaxel, alcohol, polyoxyethylated castor oil, other chemotherapy agents, other medicines, foods, dyes, or preservatives -pregnant or trying to get pregnant -breast-feeding How should I use this medicine? This drug is given as an infusion into a vein. It is administered in a hospital or clinic by a specially trained health care professional. Talk to your pediatrician regarding the use of this medicine in children. Special care may be needed. Overdosage: If you think you have taken too much of this medicine contact a poison control center or emergency room at once. NOTE: This medicine is only for you. Do not share this medicine with others. What if I miss a dose? It is important not to miss your dose. Call your doctor or health care professional if you are unable to keep an appointment. What may interact with this medicine? Do not take this medicine with any of the following medications: -disulfiram -metronidazole This medicine may also interact with the following medications: -cyclosporine -diazepam -ketoconazole -medicines to increase blood counts like filgrastim, pegfilgrastim, sargramostim -other chemotherapy drugs like cisplatin, doxorubicin, epirubicin, etoposide, teniposide, vincristine -quinidine -testosterone -vaccines -verapamil Talk to your doctor or health care professional before taking any of these medicines: -acetaminophen -aspirin -ibuprofen -ketoprofen -naproxen This list may not describe all possible interactions. Give your health care provider a list  of all the medicines, herbs, non-prescription drugs, or dietary supplements you use. Also tell them if you smoke, drink alcohol, or use illegal drugs. Some items may interact with your medicine. What should I watch for while using this medicine? Your condition will be monitored carefully while you are receiving this medicine. You will need important blood work done while you are taking this medicine. This drug may make you feel generally unwell. This is  not uncommon, as chemotherapy can affect healthy cells as well as cancer cells. Report any side effects. Continue your course of treatment even though you feel ill unless your doctor tells you to stop. This medicine can cause serious allergic reactions. To reduce your risk you will need to take other medicine(s) before treatment with this medicine. In some cases, you may be given additional medicines to help with side effects. Follow all directions for their use. Call your doctor or health care professional for advice if you get a fever, chills or sore throat, or other symptoms of a cold or flu. Do not treat yourself. This drug decreases your body's ability to fight infections. Try to avoid being around people who are sick. This medicine may increase your risk to bruise or bleed. Call your doctor or health care professional if you notice any unusual bleeding. Be careful brushing and flossing your teeth or using a toothpick because you may get an infection or bleed more easily. If you have any dental work done, tell your dentist you are receiving this medicine. Avoid taking products that contain aspirin, acetaminophen, ibuprofen, naproxen, or ketoprofen unless instructed by your doctor. These medicines may hide a fever. Do not become pregnant while taking this medicine. Women should inform their doctor if they wish to become pregnant or think they might be pregnant. There is a potential for serious side effects to an unborn child. Talk to your health care  professional or pharmacist for more information. Do not breast-feed an infant while taking this medicine. Men are advised not to father a child while receiving this medicine. This product may contain alcohol. Ask your pharmacist or healthcare provider if this medicine contains alcohol. Be sure to tell all healthcare providers you are taking this medicine. Certain medicines, like metronidazole and disulfiram, can cause an unpleasant reaction when taken with alcohol. The reaction includes flushing, headache, nausea, vomiting, sweating, and increased thirst. The reaction can last from 30 minutes to several hours. What side effects may I notice from receiving this medicine? Side effects that you should report to your doctor or health care professional as soon as possible: -allergic reactions like skin rash, itching or hives, swelling of the face, lips, or tongue -low blood counts - This drug may decrease the number of white blood cells, red blood cells and platelets. You may be at increased risk for infections and bleeding. -signs of infection - fever or chills, cough, sore throat, pain or difficulty passing urine -signs of decreased platelets or bleeding - bruising, pinpoint red spots on the skin, black, tarry stools, nosebleeds -signs of decreased red blood cells - unusually weak or tired, fainting spells, lightheadedness -breathing problems -chest pain -high or low blood pressure -mouth sores -nausea and vomiting -pain, swelling, redness or irritation at the injection site -pain, tingling, numbness in the hands or feet -slow or irregular heartbeat -swelling of the ankle, feet, hands Side effects that usually do not require medical attention (report to your doctor or health care professional if they continue or are bothersome): -bone pain -complete hair loss including hair on your head, underarms, pubic hair, eyebrows, and eyelashes -changes in the color of fingernails -diarrhea -loosening of  the fingernails -loss of appetite -muscle or joint pain -red flush to skin -sweating This list may not describe all possible side effects. Call your doctor for medical advice about side effects. You may report side effects to FDA at 1-800-FDA-1088. Where should I keep my medicine? This drug is given in  a hospital or clinic and will not be stored at home. NOTE: This sheet is a summary. It may not cover all possible information. If you have questions about this medicine, talk to your doctor, pharmacist, or health care provider.    2016, [employer] Standard. (2014-10-12 13:02:56) Lidocaine; Prilocaine cream What is this medicine? LIDOCAINE; PRILOCAINE (LYE doe kane; PRIL oh kane) is a topical anesthetic that causes loss of feeling in the skin and surrounding tissues. It is used to numb the skin before procedures or injections. This medicine may be used for other purposes; ask your health care provider or pharmacist if you have questions. What should I tell my health care provider before I take this medicine? They need to know if you have any of these conditions: -glucose-6-phosphate deficiencies -heart disease -kidney or liver disease -methemoglobinemia -an unusual or allergic reaction to lidocaine, prilocaine, other medicines, foods, dyes, or preservatives -pregnant or trying to get pregnant -breast-feeding How should I use this medicine? This medicine is for external use only on the skin. Do not take by mouth. Follow the directions on the prescription label. Wash hands before and after use. Do not use more or leave in contact with the skin longer than directed. Do not apply to eyes or open wounds. It can cause irritation and blurred or temporary loss of vision. If this medicine comes in contact with your eyes, immediately rinse the eye with water. Do not touch or rub the eye. Contact your health care provider right away. Talk to your pediatrician regarding the use of this medicine in  children. While this medicine may be prescribed for children for selected conditions, precautions do apply. Overdosage: If you think you have taken too much of this medicine contact a poison control center or emergency room at once. NOTE: This medicine is only for you. Do not share this medicine with others. What if I miss a dose? This medicine is usually only applied once prior to each procedure. It must be in contact with the skin for a period of time for it to work. If you applied this medicine later than directed, tell your health care professional before starting the procedure. What may interact with this medicine? -acetaminophen -chloroquine -dapsone -medicines to control heart rhythm -nitrates like nitroglycerin and nitroprusside -other ointments, creams, or sprays that may contain anesthetic medicine -phenobarbital -phenytoin -quinine -sulfonamides like sulfacetamide, sulfamethoxazole, sulfasalazine and others This list may not describe all possible interactions. Give your health care provider a list of all the medicines, herbs, non-prescription drugs, or dietary supplements you use. Also tell them if you smoke, drink alcohol, or use illegal drugs. Some items may interact with your medicine. What should I watch for while using this medicine? Be careful to avoid injury to the treated area while it is numb and you are not aware of pain. Avoid scratching, rubbing, or exposing the treated area to hot or cold temperatures until complete sensation has returned. The numb feeling will wear off a few hours after applying the cream. What side effects may I notice from receiving this medicine? Side effects that you should report to your doctor or health care professional as soon as possible: -blurred vision -chest pain -difficulty breathing -dizziness -drowsiness -fast or irregular heartbeat -skin rash or itching -swelling of your throat, lips, or face -trembling Side effects that usually  do not require medical attention (report to your doctor or health care professional if they continue or are bothersome): -changes in ability to feel hot or cold -  redness and swelling at the application site This list may not describe all possible side effects. Call your doctor for medical advice about side effects. You may report side effects to FDA at 1-800-FDA-1088. Where should I keep my medicine? Keep out of reach of children. Store at room temperature between 15 and 30 degrees C (59 and 86 degrees F). Keep container tightly closed. Throw away any unused medicine after the expiration date. NOTE: This sheet is a summary. It may not cover all possible information. If you have questions about this medicine, talk to your doctor, pharmacist, or health care provider.    2016, [employer]. (2007-08-30 17:14:35) Ondansetron tablets What is this medicine? ONDANSETRON (on DAN se tron) is used to treat nausea and vomiting caused by chemotherapy. It is also used to prevent or treat nausea and vomiting after surgery. This medicine may be used for other purposes; ask your health care provider or pharmacist if you have questions. What should I tell my health care provider before I take this medicine? They need to know if you have any of these conditions: -heart disease -history of irregular heartbeat -liver disease -low levels of magnesium or potassium in the blood -an unusual or allergic reaction to ondansetron, granisetron, other medicines, foods, dyes, or preservatives -pregnant or trying to get pregnant -breast-feeding How should I use this medicine? Take this medicine by mouth with a glass of water. Follow the directions on your prescription label. Take your doses at regular intervals. Do not take your medicine more often than directed. Talk to your pediatrician regarding the use of this medicine in children. Special care may be needed. Overdosage: If you think you have taken too much  of this medicine contact a poison control center or emergency room at once. NOTE: This medicine is only for you. Do not share this medicine with others. What if I miss a dose? If you miss a dose, take it as soon as you can. If it is almost time for your next dose, take only that dose. Do not take double or extra doses. What may interact with this medicine? Do not take this medicine with any of the following medications: -apomorphine -certain medicines for fungal infections like fluconazole, itraconazole, ketoconazole, posaconazole, voriconazole -cisapride -dofetilide -dronedarone -pimozide -thioridazine -ziprasidone This medicine may also interact with the following medications: -carbamazepine -certain medicines for depression, anxiety, or psychotic disturbances -fentanyl -linezolid -MAOIs like Carbex, Eldepryl, Marplan, Nardil, and Parnate -methylene blue (injected into a vein) -other medicines that prolong the QT interval (cause an abnormal heart rhythm) -phenytoin -rifampicin -tramadol This list may not describe all possible interactions. Give your health care provider a list of all the medicines, herbs, non-prescription drugs, or dietary supplements you use. Also tell them if you smoke, drink alcohol, or use illegal drugs. Some items may interact with your medicine. What should I watch for while using this medicine? Check with your doctor or health care professional right away if you have any sign of an allergic reaction. What side effects may I notice from receiving this medicine? Side effects that you should report to your doctor or health care professional as soon as possible: -allergic reactions like skin rash, itching or hives, swelling of the face, lips or tongue -breathing problems -confusion -dizziness -fast or irregular heartbeat -feeling faint or lightheaded, falls -fever and chills -loss of balance or coordination -seizures -sweating -swelling of the hands or  feet -tightness in the chest -tremors -unusually weak or tired Side effects that usually do  not require medical attention (report to your doctor or health care professional if they continue or are bothersome): -constipation or diarrhea -headache This list may not describe all possible side effects. Call your doctor for medical advice about side effects. You may report side effects to FDA at 1-800-FDA-1088. Where should I keep my medicine? Keep out of the reach of children. Store between 2 and 30 degrees C (36 and 86 degrees F). Throw away any unused medicine after the expiration date. NOTE: This sheet is a summary. It may not cover all possible information. If you have questions about this medicine, talk to your doctor, pharmacist, or health care provider.    2016, [employer]. (2012-12-01 16:27:45) Prochlorperazine tablets What is this medicine? PROCHLORPERAZINE (proe klor PER a zeen) helps to control severe nausea and vomiting. This medicine is also used to treat schizophrenia. It can also help patients who experience anxiety that is not due to psychological illness. This medicine may be used for other purposes; ask your health care provider or pharmacist if you have questions. What should I tell my health care provider before I take this medicine? They need to know if you have any of these conditions: -blood disorders or disease -dementia -liver disease or jaundice -Parkinson's disease -uncontrollable movement disorder -an unusual or allergic reaction to prochlorperazine, other medicines, foods, dyes, or preservatives -pregnant or trying to get pregnant -breast-feeding How should I use this medicine? Take this medicine by mouth with a glass of water. Follow the directions on the prescription label. Take your doses at regular intervals. Do not take your medicine more often than directed. Do not stop taking this medicine suddenly. This can cause nausea, vomiting, and  dizziness. Ask your doctor or health care professional for advice. Talk to your pediatrician regarding the use of this medicine in children. Special care may be needed. While this drug may be prescribed for children as young as 2 years for selected conditions, precautions do apply. Overdosage: If you think you have taken too much of this medicine contact a poison control center or emergency room at once. NOTE: This medicine is only for you. Do not share this medicine with others. What if I miss a dose? If you miss a dose, take it as soon as you can. If it is almost time for your next dose, take only that dose. Do not take double or extra doses. What may interact with this medicine? Do not take this medicine with any of the following medications: -amoxapine -antidepressants like citalopram, escitalopram, fluoxetine, paroxetine, and sertraline -deferoxamine -dofetilide -maprotiline -tricyclic antidepressants like amitriptyline, clomipramine, imipramine, nortiptyline and others This medicine may also interact with the following medications: -lithium -medicines for pain -phenytoin -propranolol -warfarin This list may not describe all possible interactions. Give your health care provider a list of all the medicines, herbs, non-prescription drugs, or dietary supplements you use. Also tell them if you smoke, drink alcohol, or use illegal drugs. Some items may interact with your medicine. What should I watch for while using this medicine? Visit your doctor or health care professional for regular checks on your progress. You may get drowsy or dizzy. Do not drive, use machinery, or do anything that needs mental alertness until you know how this medicine affects you. Do not stand or sit up quickly, especially if you are an older patient. This reduces the risk of dizzy or fainting spells. Alcohol may interfere with the effect of this medicine. Avoid alcoholic drinks. This medicine can reduce the response  of your body to heat or cold. Dress warm in cold weather and stay hydrated in hot weather. If possible, avoid extreme temperatures like saunas, hot tubs, very hot or cold showers, or activities that can cause dehydration such as vigorous exercise. This medicine can make you more sensitive to the sun. Keep out of the sun. If you cannot avoid being in the sun, wear protective clothing and use sunscreen. Do not use sun lamps or tanning beds/booths. Your mouth may get dry. Chewing sugarless gum or sucking hard candy, and drinking plenty of water may help. Contact your doctor if the problem does not go away or is severe. What side effects may I notice from receiving this medicine? Side effects that you should report to your doctor or health care professional as soon as possible: -blurred vision -breast enlargement in men or women -breast milk in women who are not breast-feeding -chest pain, fast or irregular heartbeat -confusion, restlessness -dark yellow or brown urine -difficulty breathing or swallowing -dizziness or fainting spells -drooling, shaking, movement difficulty (shuffling walk) or rigidity -fever, chills, sore throat -involuntary or uncontrollable movements of the eyes, mouth, head, arms, and legs -seizures -stomach area pain -unusually weak or tired -unusual bleeding or bruising -yellowing of skin or eyes Side effects that usually do not require medical attention (report to your doctor or health care professional if they continue or are bothersome): -difficulty passing urine -difficulty sleeping -headache -sexual dysfunction -skin rash, or itching This list may not describe all possible side effects. Call your doctor for medical advice about side effects. You may report side effects to FDA at 1-800-FDA-1088. Where should I keep my medicine? Keep out of the reach of children. Store at room temperature between 15 and 30 degrees C (59 and 86 degrees F). Protect from light. Throw  away any unused medicine after the expiration date. NOTE: This sheet is a summary. It may not cover all possible information. If you have questions about this medicine, talk to your doctor, pharmacist, or health care provider.    2016, Elsevier/Gold Standard. (2011-07-15 16:59:39)

## 2015-04-06 ENCOUNTER — Encounter (HOSPITAL_BASED_OUTPATIENT_CLINIC_OR_DEPARTMENT_OTHER): Payer: Medicaid Other

## 2015-04-06 ENCOUNTER — Encounter (HOSPITAL_COMMUNITY): Payer: Self-pay | Admitting: Lab

## 2015-04-06 ENCOUNTER — Other Ambulatory Visit (HOSPITAL_COMMUNITY): Payer: Self-pay | Admitting: Hematology & Oncology

## 2015-04-06 DIAGNOSIS — C155 Malignant neoplasm of lower third of esophagus: Secondary | ICD-10-CM

## 2015-04-06 DIAGNOSIS — R7401 Elevation of levels of liver transaminase levels: Secondary | ICD-10-CM

## 2015-04-06 DIAGNOSIS — K2289 Other specified disease of esophagus: Secondary | ICD-10-CM

## 2015-04-06 DIAGNOSIS — K229 Disease of esophagus, unspecified: Secondary | ICD-10-CM | POA: Diagnosis not present

## 2015-04-06 DIAGNOSIS — R740 Nonspecific elevation of levels of transaminase and lactic acid dehydrogenase [LDH]: Secondary | ICD-10-CM

## 2015-04-06 DIAGNOSIS — K228 Other specified diseases of esophagus: Secondary | ICD-10-CM

## 2015-04-06 DIAGNOSIS — K769 Liver disease, unspecified: Secondary | ICD-10-CM

## 2015-04-06 LAB — TSH: TSH: 5.277 u[IU]/mL — AB (ref 0.350–4.500)

## 2015-04-06 MED ORDER — OXYCODONE HCL ER 20 MG PO T12A: 20.0000 mg | EXTENDED_RELEASE_TABLET | Freq: Two times a day (BID) | ORAL | Status: DC

## 2015-04-06 MED ORDER — OXYCODONE-ACETAMINOPHEN 5-325 MG PO TABS: 1.0000 | ORAL_TABLET | Freq: Four times a day (QID) | ORAL | Status: DC | PRN

## 2015-04-06 NOTE — Addendum Note (Signed)
Addended by: Gerhard Perches on: 04/06/2015 01:24 PM   Modules accepted: Orders

## 2015-04-06 NOTE — Progress Notes (Signed)
Chemo teaching done but will be readdressed with patient and his wife @ next visit. Distress screening done. Consent to be signed @ next visit. TSH level drawn.  Brent Franco presented for labwork. Labs per MD order drawn via Peripheral Line 23 gauge needle inserted in rt ac Good blood return present. Procedure without incident.  Needle removed intact. Patient tolerated procedure well.

## 2015-04-06 NOTE — Addendum Note (Signed)
Addended by: Gerhard Perches on: 04/06/2015 02:06 PM   Modules accepted: Orders

## 2015-04-06 NOTE — Progress Notes (Signed)
Prior auth form sent in to [facility] tracks for patient's oxycontin.

## 2015-04-06 NOTE — Progress Notes (Signed)
Referral sent to [facility].  Records faxed on 1/27.  They will contact patient

## 2015-04-09 ENCOUNTER — Encounter (HOSPITAL_COMMUNITY): Payer: Self-pay | Admitting: *Deleted

## 2015-04-09 ENCOUNTER — Inpatient Hospital Stay (HOSPITAL_COMMUNITY): Payer: Medicaid Other

## 2015-04-09 NOTE — Progress Notes (Signed)
Spoke with dr Stanton Kidney judd made aware 03-23-15 ches xray results and history of pneumonia with treatment done dec 2016, no need to repeat chest xray day of procedure as lon as patient is asymptomatic per dr Stanton Kidney judd.

## 2015-04-10 ENCOUNTER — Other Ambulatory Visit (HOSPITAL_COMMUNITY): Payer: Self-pay | Admitting: Hematology & Oncology

## 2015-04-11 ENCOUNTER — Other Ambulatory Visit (HOSPITAL_COMMUNITY): Payer: Self-pay | Admitting: *Deleted

## 2015-04-11 ENCOUNTER — Other Ambulatory Visit (HOSPITAL_COMMUNITY): Payer: Self-pay | Admitting: Hematology & Oncology

## 2015-04-11 DIAGNOSIS — C155 Malignant neoplasm of lower third of esophagus: Secondary | ICD-10-CM

## 2015-04-11 MED ORDER — OXYCODONE HCL ER 20 MG PO T12A: 20.0000 mg | EXTENDED_RELEASE_TABLET | Freq: Three times a day (TID) | ORAL | Status: DC

## 2015-04-12 ENCOUNTER — Other Ambulatory Visit (HOSPITAL_COMMUNITY): Payer: Self-pay | Admitting: Emergency Medicine

## 2015-04-12 ENCOUNTER — Encounter (HOSPITAL_COMMUNITY): Payer: Medicaid Other | Attending: Hematology & Oncology

## 2015-04-12 DIAGNOSIS — K229 Disease of esophagus, unspecified: Secondary | ICD-10-CM | POA: Diagnosis not present

## 2015-04-12 DIAGNOSIS — K7689 Other specified diseases of liver: Secondary | ICD-10-CM | POA: Diagnosis not present

## 2015-04-12 DIAGNOSIS — C155 Malignant neoplasm of lower third of esophagus: Secondary | ICD-10-CM | POA: Diagnosis not present

## 2015-04-12 DIAGNOSIS — R740 Nonspecific elevation of levels of transaminase and lactic acid dehydrogenase [LDH]: Secondary | ICD-10-CM | POA: Diagnosis not present

## 2015-04-12 LAB — T4, FREE: Free T4: 0.76 ng/dL (ref 0.61–1.12)

## 2015-04-15 ENCOUNTER — Other Ambulatory Visit (HOSPITAL_COMMUNITY): Payer: Self-pay

## 2015-04-15 ENCOUNTER — Inpatient Hospital Stay (HOSPITAL_COMMUNITY)
Admission: EM | Admit: 2015-04-15 | Discharge: 2015-04-21 | DRG: 871 | Disposition: A | Discharge status: Home / Self Care | Payer: Medicaid Other | Attending: Internal Medicine | Admitting: Internal Medicine

## 2015-04-15 ENCOUNTER — Encounter (HOSPITAL_COMMUNITY): Payer: Self-pay | Admitting: Emergency Medicine

## 2015-04-15 ENCOUNTER — Emergency Department (HOSPITAL_COMMUNITY): Payer: Medicaid Other

## 2015-04-15 ENCOUNTER — Other Ambulatory Visit: Payer: Self-pay

## 2015-04-15 DIAGNOSIS — Z66 Do not resuscitate: Secondary | ICD-10-CM | POA: Diagnosis present

## 2015-04-15 DIAGNOSIS — Z801 Family history of malignant neoplasm of trachea, bronchus and lung: Secondary | ICD-10-CM

## 2015-04-15 DIAGNOSIS — J189 Pneumonia, unspecified organism: Secondary | ICD-10-CM | POA: Diagnosis present

## 2015-04-15 DIAGNOSIS — G8929 Other chronic pain: Secondary | ICD-10-CM | POA: Diagnosis present

## 2015-04-15 DIAGNOSIS — Z23 Encounter for immunization: Secondary | ICD-10-CM | POA: Diagnosis not present

## 2015-04-15 DIAGNOSIS — R410 Disorientation, unspecified: Secondary | ICD-10-CM | POA: Diagnosis present

## 2015-04-15 DIAGNOSIS — A419 Sepsis, unspecified organism: Secondary | ICD-10-CM | POA: Diagnosis not present

## 2015-04-15 DIAGNOSIS — N179 Acute kidney failure, unspecified: Secondary | ICD-10-CM | POA: Diagnosis present

## 2015-04-15 DIAGNOSIS — J440 Chronic obstructive pulmonary disease with acute lower respiratory infection: Secondary | ICD-10-CM | POA: Diagnosis present

## 2015-04-15 DIAGNOSIS — R0902 Hypoxemia: Secondary | ICD-10-CM | POA: Diagnosis present

## 2015-04-15 DIAGNOSIS — R4182 Altered mental status, unspecified: Secondary | ICD-10-CM | POA: Diagnosis present

## 2015-04-15 DIAGNOSIS — E1165 Type 2 diabetes mellitus with hyperglycemia: Secondary | ICD-10-CM | POA: Diagnosis present

## 2015-04-15 DIAGNOSIS — R109 Unspecified abdominal pain: Secondary | ICD-10-CM

## 2015-04-15 DIAGNOSIS — C159 Malignant neoplasm of esophagus, unspecified: Secondary | ICD-10-CM | POA: Diagnosis present

## 2015-04-15 DIAGNOSIS — Z800 Family history of malignant neoplasm of digestive organs: Secondary | ICD-10-CM

## 2015-04-15 DIAGNOSIS — Y95 Nosocomial condition: Secondary | ICD-10-CM | POA: Diagnosis present

## 2015-04-15 DIAGNOSIS — J45909 Unspecified asthma, uncomplicated: Secondary | ICD-10-CM | POA: Diagnosis present

## 2015-04-15 DIAGNOSIS — Z87891 Personal history of nicotine dependence: Secondary | ICD-10-CM

## 2015-04-15 DIAGNOSIS — Z79899 Other long term (current) drug therapy: Secondary | ICD-10-CM

## 2015-04-15 DIAGNOSIS — R339 Retention of urine, unspecified: Secondary | ICD-10-CM | POA: Diagnosis present

## 2015-04-15 DIAGNOSIS — Z794 Long term (current) use of insulin: Secondary | ICD-10-CM | POA: Diagnosis not present

## 2015-04-15 DIAGNOSIS — E039 Hypothyroidism, unspecified: Secondary | ICD-10-CM | POA: Diagnosis present

## 2015-04-15 DIAGNOSIS — J449 Chronic obstructive pulmonary disease, unspecified: Secondary | ICD-10-CM | POA: Diagnosis present

## 2015-04-15 DIAGNOSIS — J438 Other emphysema: Secondary | ICD-10-CM | POA: Diagnosis not present

## 2015-04-15 DIAGNOSIS — R0602 Shortness of breath: Secondary | ICD-10-CM | POA: Diagnosis not present

## 2015-04-15 LAB — URINALYSIS, ROUTINE W REFLEX MICROSCOPIC
BILIRUBIN URINE: NEGATIVE
GLUCOSE, UA: NEGATIVE mg/dL
HGB URINE DIPSTICK: NEGATIVE
KETONES UR: NEGATIVE mg/dL
Leukocytes, UA: NEGATIVE
Nitrite: NEGATIVE
PH: 5.5 (ref 5.0–8.0)
Protein, ur: NEGATIVE mg/dL
Specific Gravity, Urine: 1.02 (ref 1.005–1.030)

## 2015-04-15 LAB — COMPREHENSIVE METABOLIC PANEL
ALBUMIN: 3.6 g/dL (ref 3.5–5.0)
ALT: 72 U/L — AB (ref 17–63)
AST: 78 U/L — AB (ref 15–41)
Alkaline Phosphatase: 74 U/L (ref 38–126)
Anion gap: 10 (ref 5–15)
BILIRUBIN TOTAL: 0.7 mg/dL (ref 0.3–1.2)
BUN: 28 mg/dL — AB (ref 6–20)
CHLORIDE: 98 mmol/L — AB (ref 101–111)
CO2: 30 mmol/L (ref 22–32)
CREATININE: 1.63 mg/dL — AB (ref 0.61–1.24)
Calcium: 8.7 mg/dL — ABNORMAL LOW (ref 8.9–10.3)
GFR calc Af Amer: 50 mL/min — ABNORMAL LOW (ref >60)
GFR, EST NON AFRICAN AMERICAN: 43 mL/min — AB (ref >60)
GLUCOSE: 177 mg/dL — AB (ref 65–99)
POTASSIUM: 5 mmol/L (ref 3.5–5.1)
Sodium: 138 mmol/L (ref 135–145)
TOTAL PROTEIN: 7.6 g/dL (ref 6.5–8.1)

## 2015-04-15 LAB — CBC
HEMATOCRIT: 34.2 % — AB (ref 39.0–52.0)
Hemoglobin: 10.9 g/dL — ABNORMAL LOW (ref 13.0–17.0)
MCH: 29.8 pg (ref 26.0–34.0)
MCHC: 31.9 g/dL (ref 30.0–36.0)
MCV: 93.4 fL (ref 78.0–100.0)
PLATELETS: 214 10*3/uL (ref 150–400)
RBC: 3.66 MIL/uL — ABNORMAL LOW (ref 4.22–5.81)
RDW: 15 % (ref 11.5–15.5)
WBC: 18.2 10*3/uL — ABNORMAL HIGH (ref 4.0–10.5)

## 2015-04-15 LAB — LACTIC ACID, PLASMA
LACTIC ACID, VENOUS: 2 mmol/L (ref 0.5–2.0)
Lactic Acid, Venous: 1.4 mmol/L (ref 0.5–2.0)

## 2015-04-15 LAB — I-STAT CG4 LACTIC ACID, ED: LACTIC ACID, VENOUS: 3.03 mmol/L — AB (ref 0.5–2.0)

## 2015-04-15 LAB — GLUCOSE, CAPILLARY: Glucose-Capillary: 350 mg/dL — ABNORMAL HIGH (ref 65–99)

## 2015-04-15 LAB — CBG MONITORING, ED: Glucose-Capillary: 161 mg/dL — ABNORMAL HIGH (ref 65–99)

## 2015-04-15 MED ORDER — SODIUM CHLORIDE 0.9 % IV SOLN
INTRAVENOUS | Status: DC
  Administered 2015-04-16 – 2015-04-17 (×3): via INTRAVENOUS

## 2015-04-15 MED ORDER — DOCUSATE SODIUM 100 MG PO CAPS
100.0000 mg | ORAL_CAPSULE | Freq: Two times a day (BID) | ORAL | Status: DC
  Administered 2015-04-15 – 2015-04-21 (×12): 100 mg via ORAL
  Filled 2015-04-15 (×12): qty 1

## 2015-04-15 MED ORDER — IPRATROPIUM-ALBUTEROL 0.5-2.5 (3) MG/3ML IN SOLN
3.0000 mL | Freq: Four times a day (QID) | RESPIRATORY_TRACT | Status: DC | PRN
  Administered 2015-04-16: 3 mL via RESPIRATORY_TRACT
  Filled 2015-04-15: qty 3

## 2015-04-15 MED ORDER — VANCOMYCIN HCL 10 G IV SOLR
1500.0000 mg | Freq: Once | INTRAVENOUS | Status: AC
  Administered 2015-04-15: 1500 mg via INTRAVENOUS
  Filled 2015-04-15: qty 1500

## 2015-04-15 MED ORDER — SODIUM CHLORIDE 0.9 % IV BOLUS (SEPSIS)
1000.0000 mL | Freq: Once | INTRAVENOUS | Status: AC
  Administered 2015-04-15: 1000 mL via INTRAVENOUS

## 2015-04-15 MED ORDER — ENOXAPARIN SODIUM 40 MG/0.4ML ~~LOC~~ SOLN
40.0000 mg | SUBCUTANEOUS | Status: DC
  Administered 2015-04-15 – 2015-04-20 (×6): 40 mg via SUBCUTANEOUS
  Filled 2015-04-15 (×6): qty 0.4

## 2015-04-15 MED ORDER — FLUOXETINE HCL 10 MG PO CAPS
10.0000 mg | ORAL_CAPSULE | Freq: Every day | ORAL | Status: DC
  Administered 2015-04-16 – 2015-04-21 (×6): 10 mg via ORAL
  Filled 2015-04-15 (×8): qty 1

## 2015-04-15 MED ORDER — VANCOMYCIN HCL IN DEXTROSE 1-5 GM/200ML-% IV SOLN
1000.0000 mg | Freq: Two times a day (BID) | INTRAVENOUS | Status: DC
  Administered 2015-04-16 – 2015-04-18 (×5): 1000 mg via INTRAVENOUS
  Filled 2015-04-15 (×4): qty 200

## 2015-04-15 MED ORDER — INSULIN ASPART 100 UNIT/ML ~~LOC~~ SOLN
0.0000 [IU] | Freq: Three times a day (TID) | SUBCUTANEOUS | Status: DC
  Administered 2015-04-16: 2 [IU] via SUBCUTANEOUS
  Administered 2015-04-16 – 2015-04-17 (×2): 1 [IU] via SUBCUTANEOUS
  Administered 2015-04-17: 2 [IU] via SUBCUTANEOUS
  Administered 2015-04-18 – 2015-04-19 (×6): 3 [IU] via SUBCUTANEOUS
  Administered 2015-04-20: 2 [IU] via SUBCUTANEOUS
  Administered 2015-04-20: 3 [IU] via SUBCUTANEOUS
  Administered 2015-04-20 – 2015-04-21 (×3): 2 [IU] via SUBCUTANEOUS

## 2015-04-15 MED ORDER — VANCOMYCIN HCL IN DEXTROSE 1-5 GM/200ML-% IV SOLN: 1000.0000 mg | Freq: Once | INTRAVENOUS | Status: DC

## 2015-04-15 MED ORDER — ALPRAZOLAM 1 MG PO TABS
1.0000 mg | ORAL_TABLET | Freq: Three times a day (TID) | ORAL | Status: DC | PRN
  Administered 2015-04-15 – 2015-04-16 (×3): 1 mg via ORAL
  Administered 2015-04-17: 0.5 mg via ORAL
  Administered 2015-04-17 – 2015-04-19 (×3): 1 mg via ORAL
  Filled 2015-04-15 (×4): qty 2
  Filled 2015-04-15: qty 1
  Filled 2015-04-15 (×2): qty 2

## 2015-04-15 MED ORDER — ACETAMINOPHEN 325 MG PO TABS: 650.0000 mg | ORAL_TABLET | Freq: Four times a day (QID) | ORAL | Status: DC | PRN

## 2015-04-15 MED ORDER — SODIUM CHLORIDE 0.9 % IV BOLUS (SEPSIS)
500.0000 mL | Freq: Once | INTRAVENOUS | Status: AC
  Administered 2015-04-15: 500 mL via INTRAVENOUS

## 2015-04-15 MED ORDER — PIPERACILLIN-TAZOBACTAM 3.375 G IVPB
3.3750 g | Freq: Three times a day (TID) | INTRAVENOUS | Status: DC
  Administered 2015-04-15 – 2015-04-19 (×11): 3.375 g via INTRAVENOUS
  Filled 2015-04-15 (×10): qty 50

## 2015-04-15 MED ORDER — PNEUMOCOCCAL VAC POLYVALENT 25 MCG/0.5ML IJ INJ: 0.5000 mL | INJECTION | INTRAMUSCULAR | Status: DC

## 2015-04-15 MED ORDER — ACETAMINOPHEN 650 MG RE SUPP: 650.0000 mg | Freq: Four times a day (QID) | RECTAL | Status: DC | PRN

## 2015-04-15 MED ORDER — PIPERACILLIN-TAZOBACTAM 3.375 G IVPB 30 MIN
3.3750 g | Freq: Once | INTRAVENOUS | Status: AC
  Administered 2015-04-15: 3.375 g via INTRAVENOUS
  Filled 2015-04-15: qty 50

## 2015-04-15 MED ORDER — LEVOTHYROXINE SODIUM 25 MCG PO TABS
25.0000 ug | ORAL_TABLET | Freq: Every day | ORAL | Status: DC
  Administered 2015-04-16 – 2015-04-21 (×6): 25 ug via ORAL
  Filled 2015-04-15 (×6): qty 1

## 2015-04-15 MED ORDER — OXYCODONE-ACETAMINOPHEN 5-325 MG PO TABS
1.0000 | ORAL_TABLET | Freq: Four times a day (QID) | ORAL | Status: DC | PRN
  Administered 2015-04-15 – 2015-04-16 (×2): 2 via ORAL
  Administered 2015-04-16: 1 via ORAL
  Administered 2015-04-16 – 2015-04-19 (×7): 2 via ORAL
  Filled 2015-04-15 (×10): qty 2

## 2015-04-15 NOTE — ED Notes (Signed)
Lactic Acid result given to Dr Lita Mains.

## 2015-04-15 NOTE — ED Notes (Signed)
Oxygen saturations on 4 L [place] 86-90%. Patient changed to venti-mask for oxygen delivery at 35% FiO2. Oxygen sats now 94-95%.

## 2015-04-15 NOTE — H&P (Signed)
PCP:   [name], NP   Chief Complaint:  Altered mental status  HPI:  65 year old male who  has a past medical history of COPD (chronic obstructive pulmonary disease) ([place]); ETOH abuse; Depression; Arthritis; Asthma; Viral hepatitis C; Diabetes mellitus without complication ([place]); Hypothyroidism; Cancer ([place]); Pneumonia (02/2015); and Itching. Today presents to the hospital for increasing confusion, cough. As per patient's wife he has been sick over the past few days and has been coughing clear phlegm. Patient was recently diagnosed with esophageal cancer, and is supposed to undergo MRI of the abdomen as well as endoscopic ultrasound to further stage the cancer. The biopsy confirmed esophageal cancer. Patient has been running fever as high as 102 at home. Also complains of chills. Coughing up phlegm. No chest pain. Wife called cancer Center and was told to come to the ED for further evaluation In the ED patient was found to be hypoxic and confused. Chest x-ray showed pneumonia. Patient started on vancomycin and Zosyn. Currently requiring oxygen via Ventimask. Lab work in the ED revealed creatinine 1.63, lactic acid 3.03, white count 18.2  Allergies:   Allergies  Allergen Reactions  . Demerol [Meperidine] Rash      Past Medical History  Diagnosis Date  . COPD (chronic obstructive pulmonary disease) ([place])   . ETOH abuse     stopped 10 years ago  . Depression     suicide attempt 20 years ago  . Arthritis   . Asthma   . Viral hepatitis C   . Diabetes mellitus without complication ([place])   . Hypothyroidism   . Cancer ([place])     esophageal  . Pneumonia 02/2015  . Itching     both feet, known has athlet's feet now    Past Surgical History  Procedure Laterality Date  . Other surgical history Left 1995    fatty tissue tumor right upper thigh  . Dental surgery N/A January 2016    "dentures and spurs"   . Tumor excision Left 1990    inner thigh  . Colonoscopy with propofol  N/A 03/26/2015    Procedure: COLONOSCOPY WITH PROPOFOL;  Surgeon: Daneil Dolin, MD;  [facility];  Service: Endoscopy;  Laterality: N/A;  1100  . Esophagogastroduodenoscopy (egd) with propofol N/A 03/26/2015    Procedure: ESOPHAGOGASTRODUODENOSCOPY (EGD) WITH PROPOFOL;  Surgeon: Daneil Dolin, MD;  Location: [facility];  Service: Endoscopy;  Laterality: N/A;  . Biopsy  03/26/2015    Procedure: BIOPSY;  Surgeon: Daneil Dolin, MD;  Location: [facility];  Service: Endoscopy;;  esophageal mass  . Polypectomy  03/26/2015    Procedure: POLYPECTOMY;  Surgeon: Daneil Dolin, MD;  [facility];  Service: Endoscopy;;  polypectomy at ileocecal valve    Prior to Admission medications   Medication Sig Start Date End Date Taking? Authorizing Provider  albuterol (PROVENTIL HFA;VENTOLIN HFA) 108 (90 BASE) MCG/ACT inhaler Inhale 1 puff into the lungs every 6 (six) hours as needed for wheezing or shortness of breath.    Historical Provider, MD  ALPRAZolam Duanne Moron) 1 MG tablet Take 1 tablet (1 mg total) by mouth every 8 (eight) hours as needed for anxiety. 03/30/15   Patrici Ranks, MD  CARBOPLATIN IV Inject into the vein once a week.    Historical Provider, MD  docusate sodium (COLACE) 100 MG capsule Take 100 mg by mouth 2 (two) times daily. 03/10/15   Historical Provider, MD  FLUoxetine (PROZAC) 10 MG capsule Take 10  mg by mouth daily. 02/18/15   Historical Provider, MD  HUMULIN R 100 UNIT/ML injection Inject 2-12 Units into the skin 2 (two) times daily as needed for high blood sugar. 151-200 = 2 units, 201-250 = 4 units, 251-300 = 6 units, 301-350 = 8 units, 351-400 = 10 units, 401-450 = 12 units. 03/13/15   Historical Provider, MD  ipratropium-albuterol (DUONEB) 0.5-2.5 (3) MG/3ML SOLN INHALE CONTENTS OF 1 VIAL IN NEBULIZER EVERY 4 HOURS AS NEEDED FOR SHORTNESS OF BREATH. 03/10/15   Historical Provider, MD  ketoconazole (NIZORAL) 2 % cream Apply 1 application topically 2 (two) times  daily. 10/24/14   Historical Provider, MD  levothyroxine (SYNTHROID, LEVOTHROID) 25 MCG tablet Take 25 mcg by mouth daily. 11/23/14   Historical Provider, MD  lidocaine-prilocaine (EMLA) cream Apply a quarter size amount to port site 1 hour prior to chemo. Do not rub in. Cover with plastic wrap. 03/30/15   Patrici Ranks, MD  metFORMIN (GLUCOPHAGE) 500 MG tablet Take 500 mg by mouth 2 (two) times daily. 03/10/15   Historical Provider, MD  mometasone (NASONEX) 50 MCG/ACT nasal spray Place 2 sprays into the nose daily as needed (congestion).     Historical Provider, MD  nitroGLYCERIN (NITROSTAT) 0.4 MG SL tablet Place 1 tablet (0.4 mg total) under the tongue every 5 (five) minutes as needed for chest pain. 09/21/14   Herminio Commons, MD  ondansetron (ZOFRAN) 8 MG tablet Take 1 tablet (8 mg total) by mouth every 8 (eight) hours as needed for nausea or vomiting. 03/30/15   Patrici Ranks, MD  oxyCODONE (OXYCONTIN) 20 mg 12 hr tablet Take 1 tablet (20 mg total) by mouth every 8 (eight) hours. 04/11/15   Patrici Ranks, MD  oxyCODONE-acetaminophen (ROXICET) 5-325 MG tablet Take 1-2 tablets by mouth every 6 (six) hours as needed for moderate pain or severe pain. 04/06/15   Patrici Ranks, MD  PACLitaxel (TAXOL IV) Inject into the vein once a week.    Historical Provider, MD  prochlorperazine (COMPAZINE) 10 MG tablet Take 1 tablet (10 mg total) by mouth every 6 (six) hours as needed for nausea or vomiting. 03/30/15   Patrici Ranks, MD  Pseudoeph-Doxylamine-DM-APAP (NYQUIL MULTI-SYMPTOM PO) Take 1 tablet by mouth daily as needed (congestion/cold).     Historical Provider, MD  SPIRIVA HANDIHALER 18 MCG inhalation capsule Place 1 puff into inhaler and inhale daily. 12/04/14   Historical Provider, MD    Social History:  reports that he quit smoking about 16 months ago. His smoking use included Cigarettes. He started smoking about 48 years ago. He has a 70.5 pack-year smoking history. He has never used  smokeless tobacco. He reports that he does not drink alcohol or use illicit drugs.  Family History  Problem Relation Age of Onset  . Pancreatic cancer Mother   . Lung cancer Father   . Colon cancer Neg Hx      All the positives are listed in BOLD  Review of Systems:  HEENT: Headache, blurred vision, runny nose, sore throat Neck: Hypothyroidism, hyperthyroidism,,lymphadenopathy Chest : Shortness of breath, history of COPD, Asthma Heart : Chest pain, history of coronary arterey disease GI:  Nausea, vomiting, diarrhea, constipation, GERD GU: Dysuria, urgency, frequency of urination, hematuria Neuro: Stroke, seizures, syncope Psych: Depression, anxiety, hallucinations   Physical Exam: Blood pressure 119/64, pulse 106, temperature 102.2 F (39 C), temperature source Temporal, resp. rate 22, SpO2 93 %. Constitutional:   Patient is a well-developed and well-nourished male in  no acute distress and cooperative with exam. Head: Normocephalic and atraumatic Mouth: Mucus membranes moist Eyes: PERRL, EOMI, conjunctivae normal Neck: Supple, No Thyromegaly Cardiovascular: RRR, S1 normal, S2 normal Pulmonary/Chest: Bilateral rhonchi Abdominal: Soft. Non-tender, non-distended, bowel sounds are normal, no masses, organomegaly, or guarding present.  Neurological: A&O x3, Strength is normal and symmetric bilaterally, cranial nerve II-XII are grossly intact, no focal motor deficit, sensory intact to light touch bilaterally.  Extremities : No Cyanosis, Clubbing or Edema  Labs on Admission:  Basic Metabolic Panel:  Recent Labs Lab 04/15/15 1612  NA 138  K 5.0  CL 98*  CO2 30  GLUCOSE 177*  BUN 28*  CREATININE 1.63*  CALCIUM 8.7*   Liver Function Tests:  Recent Labs Lab 04/15/15 1612  AST 78*  ALT 72*  ALKPHOS 74  BILITOT 0.7  PROT 7.6  ALBUMIN 3.6   CBC:  Recent Labs Lab 04/15/15 1612  WBC 18.2*  HGB 10.9*  HCT 34.2*  MCV 93.4  PLT 214    CBG:  Recent Labs Lab  04/15/15 1652  GLUCAP 161*    Radiological Exams on Admission: Dg Chest Portable 1 View  04/15/2015  CLINICAL DATA:  Cough and fever.  Esophageal cancer EXAM: PORTABLE CHEST 1 VIEW COMPARISON:  03/23/2015 FINDINGS: Progression of left lower lobe infiltrate may represent recurrent pneumonia. No significant effusion. Right lung clear. Port-A-Cath tip in the SVC. Negative for heart failure. IMPRESSION: Progression of left lower lobe infiltrate since prior study which may represent recurrent pneumonia. Electronically Signed   By: Franchot Gallo M.D.   On: 04/15/2015 16:32    EKG: Independently reviewed. Sinus tachycardia   Assessment/Plan Active Problems:   Sepsis ([place])   Pneumonia   Sepsis due to healthcare associated pneumonia Patient was under with sepsis due to healthcare associated pneumonia Patient blood pressure is stable after he received IV fluid boluses in the ED Will continue with normal saline at 125 mL per hour Continue vancomycin and Zosyn per pharmacy consultation Follow blood culture, will obtain urinary Legionella antigen, urinary step pneumo antigen We'll follow lactic acid every 3 hours  Acute kidney injury Patient has worsening BUN/creatinine Will continue IV fluids. Follow BMP in a.m.  Recent diagnosis of esophageal cancer Patient diagnosed with esophageal cancer recently. Chemotherapy and radiation treatment not started yet Further staging with endoscopic ultrasound as per GI as outpatient Continue Percocet when necessary for pain  Diabetes mellitus Hold metformin Start sliding scale insulin with NovoLog  Code status:  DO NOT RESUSCITATE  Family discussion: Admission, patients condition and plan of care including tests being ordered have been discussed with the patient and his wife at bedside who indicate understanding and agree with the plan and Code Status.   Time Spent on Admission: 60 min  [facility] Pager:  [phone] 04/15/2015, 5:53 PM  If 7PM-7AM, please contact night-coverage  [email or web address]  Password [email or web address]

## 2015-04-15 NOTE — ED Provider Notes (Signed)
CSN: [id number]     Arrival date & time 04/15/15  1543 History   First MD Initiated Contact with Patient 04/15/15 1600     Chief Complaint  Patient presents with  . Altered Mental Status     (Consider location/radiation/quality/duration/timing/severity/associated sxs/prior Treatment) HPI Patient presents with 2 days of increasing confusion and cough. Patient's also had multiple bouts of vomiting and diarrhea. Recently diagnosed with esophageal cancer but is yet  to start radiation or chemotherapy. When asked if the patient is in pain he states he has pain everywhere. When asked if he has any new pain he states he has new pain every day. Wife reports that he has complained of sore throat. He denies any neck pain or stiffness. No new abdominal pain or swelling. Past Medical History  Diagnosis Date  . COPD (chronic obstructive pulmonary disease) ([place])   . ETOH abuse     stopped 10 years ago  . Depression     suicide attempt 20 years ago  . Arthritis   . Asthma   . Viral hepatitis C   . Diabetes mellitus without complication ([place])   . Hypothyroidism   . Cancer ([place])     esophageal  . Pneumonia 02/2015  . Itching     both feet, known has athlet's feet now   Past Surgical History  Procedure Laterality Date  . Other surgical history Left 1995    fatty tissue tumor right upper thigh  . Dental surgery N/A January 2016    "dentures and spurs"   . Tumor excision Left 1990    inner thigh  . Colonoscopy with propofol N/A 03/26/2015    Procedure: COLONOSCOPY WITH PROPOFOL;  Surgeon: Daneil Dolin, MD;  [facility];  Service: Endoscopy;  Laterality: N/A;  1100  . Esophagogastroduodenoscopy (egd) with propofol N/A 03/26/2015    Procedure: ESOPHAGOGASTRODUODENOSCOPY (EGD) WITH PROPOFOL;  Surgeon: Daneil Dolin, MD;  Location: [facility];  Service: Endoscopy;  Laterality: N/A;  . Biopsy  03/26/2015    Procedure: BIOPSY;  Surgeon: Daneil Dolin, MD;  [facility];   Service: Endoscopy;;  esophageal mass  . Polypectomy  03/26/2015    Procedure: POLYPECTOMY;  Surgeon: Daneil Dolin, MD;  [facility];  Service: Endoscopy;;  polypectomy at ileocecal valve   Family History  Problem Relation Age of Onset  . Pancreatic cancer Mother   . Lung cancer Father   . Colon cancer Neg Hx    Social History  Substance Use Topics  . Smoking status: Former Smoker -- 1.50 packs/day for 47 years    Types: Cigarettes    Start date: 12/03/1966    Quit date: 12/08/2013  . Smokeless tobacco: Never Used  . Alcohol Use: No     Comment: Quit 2006: previously drank heavily. Had 1 drink 2-3 years ago (2013-2014)    Review of Systems  Constitutional: Positive for fever.  HENT: Positive for sore throat.   Respiratory: Positive for cough.   Gastrointestinal: Positive for vomiting, diarrhea and abdominal distention. Negative for abdominal pain.  Genitourinary: Negative for dysuria.  Musculoskeletal: Negative for neck pain and neck stiffness.  Skin: Negative for rash and wound.  Neurological: Negative for dizziness and headaches.  Psychiatric/Behavioral: Positive for confusion.  All other systems reviewed and are negative.     Allergies  Demerol  Home Medications   Prior to Admission medications   Medication Sig Start Date End Date Taking? Authorizing Provider  albuterol (PROVENTIL  HFA;VENTOLIN HFA) 108 (90 BASE) MCG/ACT inhaler Inhale 1 puff into the lungs every 6 (six) hours as needed for wheezing or shortness of breath.    Historical Provider, MD  ALPRAZolam Duanne Moron) 1 MG tablet Take 1 tablet (1 mg total) by mouth every 8 (eight) hours as needed for anxiety. 03/30/15   Patrici Ranks, MD  CARBOPLATIN IV Inject into the vein once a week.    Historical Provider, MD  docusate sodium (COLACE) 100 MG capsule Take 100 mg by mouth 2 (two) times daily. 03/10/15   Historical Provider, MD  FLUoxetine (PROZAC) 10 MG capsule Take 10 mg by mouth daily. 02/18/15    Historical Provider, MD  HUMULIN R 100 UNIT/ML injection Inject 2-12 Units into the skin 2 (two) times daily as needed for high blood sugar. 151-200 = 2 units, 201-250 = 4 units, 251-300 = 6 units, 301-350 = 8 units, 351-400 = 10 units, 401-450 = 12 units. 03/13/15   Historical Provider, MD  ipratropium-albuterol (DUONEB) 0.5-2.5 (3) MG/3ML SOLN INHALE CONTENTS OF 1 VIAL IN NEBULIZER EVERY 4 HOURS AS NEEDED FOR SHORTNESS OF BREATH. 03/10/15   Historical Provider, MD  ketoconazole (NIZORAL) 2 % cream Apply 1 application topically 2 (two) times daily. 10/24/14   Historical Provider, MD  levothyroxine (SYNTHROID, LEVOTHROID) 25 MCG tablet Take 25 mcg by mouth daily. 11/23/14   Historical Provider, MD  lidocaine-prilocaine (EMLA) cream Apply a quarter size amount to port site 1 hour prior to chemo. Do not rub in. Cover with plastic wrap. 03/30/15   Patrici Ranks, MD  metFORMIN (GLUCOPHAGE) 500 MG tablet Take 500 mg by mouth 2 (two) times daily. 03/10/15   Historical Provider, MD  mometasone (NASONEX) 50 MCG/ACT nasal spray Place 2 sprays into the nose daily as needed (congestion).     Historical Provider, MD  nitroGLYCERIN (NITROSTAT) 0.4 MG SL tablet Place 1 tablet (0.4 mg total) under the tongue every 5 (five) minutes as needed for chest pain. 09/21/14   Herminio Commons, MD  ondansetron (ZOFRAN) 8 MG tablet Take 1 tablet (8 mg total) by mouth every 8 (eight) hours as needed for nausea or vomiting. 03/30/15   Patrici Ranks, MD  oxyCODONE (OXYCONTIN) 20 mg 12 hr tablet Take 1 tablet (20 mg total) by mouth every 8 (eight) hours. 04/11/15   Patrici Ranks, MD  oxyCODONE-acetaminophen (ROXICET) 5-325 MG tablet Take 1-2 tablets by mouth every 6 (six) hours as needed for moderate pain or severe pain. 04/06/15   Patrici Ranks, MD  PACLitaxel (TAXOL IV) Inject into the vein once a week.    Historical Provider, MD  prochlorperazine (COMPAZINE) 10 MG tablet Take 1 tablet (10 mg total) by mouth every 6 (six)  hours as needed for nausea or vomiting. 03/30/15   Patrici Ranks, MD  Pseudoeph-Doxylamine-DM-APAP (NYQUIL MULTI-SYMPTOM PO) Take 1 tablet by mouth daily as needed (congestion/cold).     Historical Provider, MD  SPIRIVA HANDIHALER 18 MCG inhalation capsule Place 1 puff into inhaler and inhale daily. 12/04/14   Historical Provider, MD   BP 119/64 mmHg  Pulse 106  Temp(Src) 102.2 F (39 C) (Temporal)  Resp 22  SpO2 93% Physical Exam  Constitutional: He is oriented to person, place, and time. He appears well-developed and well-nourished. No distress.  HENT:  Head: Normocephalic and atraumatic.  Patient with dry mucous membranes. Oropharynx is mildly erythematous without exudates.  Eyes: EOM are normal. Pupils are equal, round, and reactive to light. Right eye exhibits  no discharge. Left eye exhibits no discharge.  Neck: Normal range of motion. Neck supple.  Patient is resistant to exam though does not appear to have any meningeal signs.  Cardiovascular: Normal rate and regular rhythm.  Exam reveals no gallop and no friction rub.   No murmur heard. Pulmonary/Chest: Effort normal. No respiratory distress. He has no wheezes. He has rales.  Decreased breath sounds on the left with scattered rhonchi.  Abdominal: Soft. Bowel sounds are normal. He exhibits distension. He exhibits no mass. There is no tenderness. There is no rebound and no guarding.  Diffuse abdominal tenderness. dull to percussion  Musculoskeletal: Normal range of motion. He exhibits no edema or tenderness.  No lower extremity swelling or pain  Lymphadenopathy:    He has no cervical adenopathy.  Neurological: He is alert and oriented to person, place, and time.  Moves all extremities without deficit. Sensation is fully intact.  Skin: Skin is warm and dry. No rash noted. No erythema.  Psychiatric: He has a normal mood and affect. His behavior is normal.  Nursing note and vitals reviewed.   ED Course  Procedures (including  critical care time) Labs Review Labs Reviewed  COMPREHENSIVE METABOLIC PANEL - Abnormal; Notable for the following:    Chloride 98 (*)    Glucose, Bld 177 (*)    BUN 28 (*)    Creatinine, Ser 1.63 (*)    Calcium 8.7 (*)    AST 78 (*)    ALT 72 (*)    GFR calc non Af Amer 43 (*)    GFR calc Af Amer 50 (*)    All other components within normal limits  CBC - Abnormal; Notable for the following:    WBC 18.2 (*)    RBC 3.66 (*)    Hemoglobin 10.9 (*)    HCT 34.2 (*)    All other components within normal limits  CBG MONITORING, ED - Abnormal; Notable for the following:    Glucose-Capillary 161 (*)    All other components within normal limits  I-STAT CG4 LACTIC ACID, ED - Abnormal; Notable for the following:    Lactic Acid, Venous 3.03 (*)    All other components within normal limits  CULTURE, BLOOD (ROUTINE X 2)  CULTURE, BLOOD (ROUTINE X 2)  URINALYSIS, ROUTINE W REFLEX MICROSCOPIC (NOT AT [facility])    Imaging Review Dg Chest Portable 1 View  04/15/2015  CLINICAL DATA:  Cough and fever.  Esophageal cancer EXAM: PORTABLE CHEST 1 VIEW COMPARISON:  03/23/2015 FINDINGS: Progression of left lower lobe infiltrate may represent recurrent pneumonia. No significant effusion. Right lung clear. Port-A-Cath tip in the SVC. Negative for heart failure. IMPRESSION: Progression of left lower lobe infiltrate since prior study which may represent recurrent pneumonia. Electronically Signed   By: Franchot Gallo M.D.   On: 04/15/2015 16:32   I have personally reviewed and evaluated these images and lab results as part of my medical decision-making.   EKG Interpretation   Date/Time:  Sunday April 15 2015 16:10:14 EST Ventricular Rate:  104 PR Interval:  126 QRS Duration: 77 QT Interval:  340 QTC Calculation: 447 R Axis:   89 Text Interpretation:  Sinus tachycardia Borderline right axis deviation  Confirmed by Lita Mains  MD, Annalise Mcdiarmid ([id number]) on 04/15/2015 4:36:51 PM     CRITICAL CARE Performed by:  Lita Mains, Jadence Kinlaw Total critical care time: 20 minutes Critical care time was exclusive of separately billable procedures and treating other patients. Critical care was necessary to treat or  prevent imminent or life-threatening deterioration. Critical care was time spent personally by me on the following activities: development of treatment plan with patient and/or surrogate as well as nursing, discussions with consultants, evaluation of patient's response to treatment, examination of patient, obtaining history from patient or surrogate, ordering and performing treatments and interventions, ordering and review of laboratory studies, ordering and review of radiographic studies, pulse oximetry and re-evaluation of patient's condition. MDM   Final diagnoses:  Sepsis, due to unspecified organism ([place])  Healthcare-associated pneumonia    Code sepsis initiated. Likely pneumonia as a source.   Pneumonia on chest x-ray. Elevated likely acid and white blood cell count. Discussed with Dr. Darrick Meigs. Will admit to step down bed.  Julianne Rice, MD 04/15/15 [phone]

## 2015-04-15 NOTE — ED Notes (Signed)
PT and family report fever, confusion and SOB with wheezing x1 day. PT recently dx with esophageal cancer. Wife reports giving pt 2 tylenol tablets around 1130 today.

## 2015-04-15 NOTE — Progress Notes (Addendum)
Pharmacy Antibiotic Note  Brent Franco is a 65 y.o. male admitted on 04/15/2015 with sepsis.  Pharmacy has been consulted for vancomycin and zosyn dosing.  Plan: Vancomycin 1500 mg IV X 1 then 750 ,mg IV q12 hours Zosyn 3.375 gm IV q8 hours F/u renal function, cultures and clinical course     Temp (24hrs), Avg:102.2 F (39 C), Min:102.2 F (39 C), Max:102.2 F (39 C)   Recent Labs Lab 04/15/15 1612 04/15/15 1619  WBC 18.2*  --   CREATININE 1.63*  --   LATICACIDVEN  --  3.03*    Estimated Creatinine Clearance: 50.2 mL/min (by C-G formula based on Cr of 1.63).    Allergies  Allergen Reactions  . Demerol [Meperidine] Rash    Antimicrobials this admission: Vancomycin  2/5  >>  Zosyn  2/5 >>    Thank you for allowing pharmacy to be a part of this patient's care.  Beverlee Nims 04/15/2015 5:37 PM Addum:  Ivy Lynn 0.97,CrCl ~80 ml/hr.  Will adjust vanc to 1 gm IV q12 hours

## 2015-04-16 ENCOUNTER — Ambulatory Visit (HOSPITAL_COMMUNITY): Payer: Medicaid Other | Admitting: Oncology

## 2015-04-16 ENCOUNTER — Ambulatory Visit (HOSPITAL_COMMUNITY): Payer: Medicaid Other

## 2015-04-16 ENCOUNTER — Inpatient Hospital Stay (HOSPITAL_COMMUNITY): Payer: Medicaid Other

## 2015-04-16 DIAGNOSIS — J189 Pneumonia, unspecified organism: Secondary | ICD-10-CM | POA: Insufficient documentation

## 2015-04-16 LAB — GLUCOSE, CAPILLARY
GLUCOSE-CAPILLARY: 128 mg/dL — AB (ref 65–99)
GLUCOSE-CAPILLARY: 96 mg/dL (ref 65–99)
Glucose-Capillary: 119 mg/dL — ABNORMAL HIGH (ref 65–99)
Glucose-Capillary: 178 mg/dL — ABNORMAL HIGH (ref 65–99)

## 2015-04-16 LAB — COMPREHENSIVE METABOLIC PANEL
ALK PHOS: 64 U/L (ref 38–126)
ALT: 53 U/L (ref 17–63)
AST: 61 U/L — ABNORMAL HIGH (ref 15–41)
Albumin: 2.7 g/dL — ABNORMAL LOW (ref 3.5–5.0)
Anion gap: 5 (ref 5–15)
BUN: 17 mg/dL (ref 6–20)
CALCIUM: 7.3 mg/dL — AB (ref 8.9–10.3)
CO2: 29 mmol/L (ref 22–32)
CREATININE: 0.97 mg/dL (ref 0.61–1.24)
Chloride: 105 mmol/L (ref 101–111)
GFR calc Af Amer: >60 mL/min (ref >60)
Glucose, Bld: 148 mg/dL — ABNORMAL HIGH (ref 65–99)
Potassium: 4.4 mmol/L (ref 3.5–5.1)
Sodium: 139 mmol/L (ref 135–145)
Total Bilirubin: 0.6 mg/dL (ref 0.3–1.2)
Total Protein: 6.1 g/dL — ABNORMAL LOW (ref 6.5–8.1)

## 2015-04-16 LAB — STREP PNEUMONIAE URINARY ANTIGEN: Strep Pneumo Urinary Antigen: NEGATIVE

## 2015-04-16 LAB — CBC
HEMATOCRIT: 29.9 % — AB (ref 39.0–52.0)
HEMOGLOBIN: 9.3 g/dL — AB (ref 13.0–17.0)
MCH: 29.5 pg (ref 26.0–34.0)
MCHC: 31.1 g/dL (ref 30.0–36.0)
MCV: 94.9 fL (ref 78.0–100.0)
Platelets: 161 10*3/uL (ref 150–400)
RBC: 3.15 MIL/uL — AB (ref 4.22–5.81)
RDW: 15.7 % — ABNORMAL HIGH (ref 11.5–15.5)
WBC: 13.4 10*3/uL — ABNORMAL HIGH (ref 4.0–10.5)

## 2015-04-16 LAB — MRSA PCR SCREENING: MRSA by PCR: NEGATIVE

## 2015-04-16 MED ORDER — NYSTATIN 100000 UNIT/ML MT SUSP
5.0000 mL | Freq: Four times a day (QID) | OROMUCOSAL | Status: DC
  Administered 2015-04-16 – 2015-04-21 (×17): 500000 [IU] via ORAL
  Filled 2015-04-16 (×17): qty 5

## 2015-04-16 NOTE — Patient Care Conference (Signed)
Patient seen. Full note to follow. Met with patient and wife at bedside. Patient states that after discussing with other family, he has decided on Full Code status. Confirmed with patient's wife at bedside. Will alert Dr. Whitney Muse of patient's admission. For now, will change status to Full.

## 2015-04-16 NOTE — Progress Notes (Signed)
**Note De-Identified  Obfuscation** CPT done with bed.  Patient tolerated well and was able to remove secretions.  BBS clr/dim.  SXN set-up.  RRT to cont. To monitor.

## 2015-04-16 NOTE — Progress Notes (Signed)
Inpatient Diabetes Program Recommendations  AACE/ADA: New Consensus Statement on Inpatient Glycemic Control (2015)  Target Ranges:  Prepandial:   less than 140 mg/dL      Peak postprandial:   less than 180 mg/dL (1-2 hours)      Critically ill patients:  140 - 180 mg/dL  Results for JRAKE, BANKES (MRN [id number]) as of 04/16/2015 09:16  Ref. Range 04/15/2015 16:52 04/15/2015 21:13 04/16/2015 07:30  Glucose-Capillary Latest Ref Range: 65-99 mg/dL 161 (H) 350 (H) 128 (H)   Review of Glycemic Control  Diabetes history: DM2 Outpatient Diabetes medications: Metformin 500 mg BID Current orders for Inpatient glycemic control: Novolog 0-9 units TID with meals  Inpatient Diabetes Program Recommendations: Correction (SSI): Please consider adding Novolog bedtime correction scale.  Thanks, Barnie Alderman, RN, MSN, CDE Diabetes Coordinator Inpatient Diabetes Program [phone] (Team Pager from [place] to [place]) [phone] ([facility] office) [phone] [facility] office) [phone] [facility] office)

## 2015-04-16 NOTE — Progress Notes (Signed)
[facility] PROGRESS NOTE  Brent Franco [id number] DOB: 1950-09-22 DOA: 04/15/2015 PCP: [name], NP  Assessment/Plan: 1. Sepsis: blood pressures have normalized, will continue IVF of NS at 165ml/hr, Continue Vancomycin and Zosyn, Blood cultures negative <24h, Strep pneumo urinary antigen as well as legionella urinary antigen pending, lactic acid improved, disposition is guarded  2. Acute kidney injury: IVF, BUN/Cr improved this morning. Will continue to follow.  3. Recent diagnosis of esophageal cancer: Dr. Whitney Muse placed on patients treatment team.  Patient was supposed to get MRI today for evaluation of possible metastases in the liver.  Discussed with patient and his wife that he may not be stable from a respiratory standpoint to undergo MRI today.  Further staging of cancer to be done outpatient via endoscopic ultrasound, will continue Percocet for pain  4. Diabetes mellitus: holding metformin, SSI, blood sugars relatively well controlled- one hyperglycemia episode noted of 350  5. DVT prophylaxis: Lovenox  Code Status: changed to full code this morning Family Communication: wife at bedside Disposition Plan: guarded, will follow respiratory status carefully   Consultants:  None  Procedures:  none  Antibiotics:  Zosyn and Vancomycin 04/15/15  HPI/Subjective: 65 year old male who  has a past medical history of COPD (chronic obstructive pulmonary disease) ([place]); ETOH abuse; Depression; Arthritis; Asthma; Viral hepatitis C; Diabetes mellitus without complication ([place]); Hypothyroidism; Cancer ([place]); Pneumonia (02/2015); and Itching. Yesterday presented to the hospital for increasing confusion, cough. As per patient's wife he has been sick over the past few days and has been coughing clear phlegm. Patient was recently diagnosed with esophageal cancer, and is supposed to undergo MRI of the abdomen as well as endoscopic ultrasound to further stage the cancer. The biopsy  confirmed esophageal cancer. Patient has been running fever as high as 102 at home. Also complains of chills. Coughing up phlegm. No chest pain. Wife called cancer Center and was told to come to the ED for further evaluation. In the ED patient was found to be hypoxic and confused. Chest x-ray showed pneumonia. Patient started on vancomycin and Zosyn. Currently requiring oxygen via Ventimask. Lab work in the ED revealed creatinine 1.63, lactic acid 3.03, white count 18.2.  Overnight patient blood pressures remained stable.  This morning he was tachypneic and patient states he is not feeling much better than he did prior to admission.    Objective: Filed Vitals:   04/16/15 0730 04/16/15 0800  BP:  115/74  Pulse:  99  Temp: 97 F (36.1 C)   Resp:  22    Intake/Output Summary (Last 24 hours) at 04/16/15 0910 Last data filed at 04/16/15 0844  Gross per 24 hour  Intake 2150.42 ml  Output   1075 ml  Net 1075.42 ml   Filed Weights   04/15/15 1837 04/16/15 0500  Weight: 89.5 kg (197 lb 5 oz) 89.3 kg (196 lb 13.9 oz)    Exam:   General:  Middle aged male that appears acutely ill, mild distress, laying in bed  Cardiovascular: S1S2, RRR, no murmurs or gallops, 2+ pulses radial and dorsalis pedis bilaterally  Respiratory: rales and rhonchi basilar and middle lung on left, rhonchi on the right, poor air movement and aeration  Abdomen: soft, nontender, nondistended, bowel sounds in all quadrants  Musculoskeletal: FROM UE and LE bilaterally   Data Reviewed: Basic Metabolic Panel:  Recent Labs Lab 04/15/15 1612 04/16/15 0436  NA 138 139  K 5.0 4.4  CL 98* 105  CO2 30 29  GLUCOSE 177* 148*  BUN 28* 17  CREATININE 1.63* 0.97  CALCIUM 8.7* 7.3*   Liver Function Tests:  Recent Labs Lab 04/15/15 1612 04/16/15 0436  AST 78* 61*  ALT 72* 53  ALKPHOS 74 64  BILITOT 0.7 0.6  PROT 7.6 6.1*  ALBUMIN 3.6 2.7*   No results for input(s): LIPASE, AMYLASE in the last 168 hours. No  results for input(s): AMMONIA in the last 168 hours. CBC:  Recent Labs Lab 04/15/15 1612 04/16/15 0436  WBC 18.2* 13.4*  HGB 10.9* 9.3*  HCT 34.2* 29.9*  MCV 93.4 94.9  PLT 214 161   Cardiac Enzymes: No results for input(s): CKTOTAL, CKMB, CKMBINDEX, TROPONINI in the last 168 hours. BNP (last 3 results) No results for input(s): BNP in the last 8760 hours.  ProBNP (last 3 results) No results for input(s): PROBNP in the last 8760 hours.  CBG:  Recent Labs Lab 04/15/15 1652 04/15/15 2113 04/16/15 0730  GLUCAP 161* 350* 128*    Recent Results (from the past 240 hour(s))  Culture, blood (Routine X 2) w Reflex to ID Panel     Status: None (Preliminary result)   Collection Time: 04/15/15  4:12 PM  Result Value Ref Range Status   Specimen Description LEFT ANTECUBITAL  Final   Special Requests BOTTLES DRAWN AEROBIC AND ANAEROBIC 4CC EACH  Final   Culture PENDING  Incomplete   Report Status PENDING  Incomplete  Culture, blood (Routine X 2) w Reflex to ID Panel     Status: None (Preliminary result)   Collection Time: 04/15/15  4:13 PM  Result Value Ref Range Status   Specimen Description RIGHT ANTECUBITAL  Final   Special Requests BOTTLES DRAWN AEROBIC AND ANAEROBIC 4CC EACH  Final   Culture PENDING  Incomplete   Report Status PENDING  Incomplete  MRSA PCR Screening     Status: None   Collection Time: 04/15/15  6:30 PM  Result Value Ref Range Status   MRSA by PCR NEGATIVE NEGATIVE Final    Comment:        The GeneXpert MRSA Assay (FDA approved for NASAL specimens only), is one component of a comprehensive MRSA colonization surveillance program. It is not intended to diagnose MRSA infection nor to guide or monitor treatment for MRSA infections.      Studies: Dg Chest Portable 1 View  04/15/2015  CLINICAL DATA:  Cough and fever.  Esophageal cancer EXAM: PORTABLE CHEST 1 VIEW COMPARISON:  03/23/2015 FINDINGS: Progression of left lower lobe infiltrate may represent  recurrent pneumonia. No significant effusion. Right lung clear. Port-A-Cath tip in the SVC. Negative for heart failure. IMPRESSION: Progression of left lower lobe infiltrate since prior study which may represent recurrent pneumonia. Electronically Signed   By: Franchot Gallo M.D.   On: 04/15/2015 16:32    Scheduled Meds: . docusate sodium  100 mg Oral BID  . enoxaparin (LOVENOX) injection  40 mg Subcutaneous Q24H  . FLUoxetine  10 mg Oral Daily  . insulin aspart  0-9 Units Subcutaneous TID WC  . levothyroxine  25 mcg Oral QAC breakfast  . piperacillin-tazobactam (ZOSYN)  IV  3.375 g Intravenous Q8H  . pneumococcal 23 valent vaccine  0.5 mL Intramuscular Tomorrow-1000  . vancomycin  1,000 mg Intravenous Q12H   Continuous Infusions: . sodium chloride Stopped (04/16/15 0738)    Active Problems:   Sepsis ([place])   Pneumonia    Time spent: 16 minutes    [place] [facility] Pager [phone]. If 7PM-7AM, please contact night-coverage at  [email or web address], password [facility] 04/16/2015, 9:10 AM  LOS: 1 day

## 2015-04-17 ENCOUNTER — Encounter: Payer: Self-pay | Admitting: *Deleted

## 2015-04-17 ENCOUNTER — Inpatient Hospital Stay (HOSPITAL_COMMUNITY): Payer: Medicaid Other

## 2015-04-17 ENCOUNTER — Encounter: Payer: Medicaid Other | Admitting: Cardiothoracic Surgery

## 2015-04-17 DIAGNOSIS — R0602 Shortness of breath: Secondary | ICD-10-CM | POA: Insufficient documentation

## 2015-04-17 LAB — GLUCOSE, CAPILLARY
GLUCOSE-CAPILLARY: 160 mg/dL — AB (ref 65–99)
GLUCOSE-CAPILLARY: 181 mg/dL — AB (ref 65–99)
Glucose-Capillary: 114 mg/dL — ABNORMAL HIGH (ref 65–99)
Glucose-Capillary: 122 mg/dL — ABNORMAL HIGH (ref 65–99)

## 2015-04-17 LAB — HEMOGLOBIN A1C
Hgb A1c MFr Bld: 8.3 % — ABNORMAL HIGH (ref 4.8–5.6)
MEAN PLASMA GLUCOSE: 192 mg/dL

## 2015-04-17 LAB — BLOOD GAS, ARTERIAL
Acid-Base Excess: 3.5 mmol/L — ABNORMAL HIGH (ref 0.0–2.0)
BICARBONATE: 27.4 meq/L — AB (ref 20.0–24.0)
O2 Content: 2 L/min
O2 SAT: 93.9 %
PO2 ART: 67.5 mmHg — AB (ref 80.0–100.0)
Patient temperature: 37
TCO2: 12.3 mmol/L (ref 0–100)
pCO2 arterial: 41.7 mmHg (ref 35.0–45.0)
pH, Arterial: 7.434 (ref 7.350–7.450)

## 2015-04-17 LAB — BASIC METABOLIC PANEL
ANION GAP: 7 (ref 5–15)
BUN: 9 mg/dL (ref 6–20)
CHLORIDE: 103 mmol/L (ref 101–111)
CO2: 30 mmol/L (ref 22–32)
Calcium: 7.4 mg/dL — ABNORMAL LOW (ref 8.9–10.3)
Creatinine, Ser: 0.71 mg/dL (ref 0.61–1.24)
GFR calc Af Amer: >60 mL/min (ref >60)
GLUCOSE: 150 mg/dL — AB (ref 65–99)
POTASSIUM: 4.1 mmol/L (ref 3.5–5.1)
Sodium: 140 mmol/L (ref 135–145)

## 2015-04-17 LAB — CBC
HEMATOCRIT: 29.2 % — AB (ref 39.0–52.0)
HEMOGLOBIN: 9.2 g/dL — AB (ref 13.0–17.0)
MCH: 30.1 pg (ref 26.0–34.0)
MCHC: 31.5 g/dL (ref 30.0–36.0)
MCV: 95.4 fL (ref 78.0–100.0)
Platelets: 159 10*3/uL (ref 150–400)
RBC: 3.06 MIL/uL — ABNORMAL LOW (ref 4.22–5.81)
RDW: 15.3 % (ref 11.5–15.5)
WBC: 10.4 10*3/uL (ref 4.0–10.5)

## 2015-04-17 LAB — LEGIONELLA ANTIGEN, URINE

## 2015-04-17 MED ORDER — IPRATROPIUM-ALBUTEROL 0.5-2.5 (3) MG/3ML IN SOLN
3.0000 mL | RESPIRATORY_TRACT | Status: DC
  Administered 2015-04-18 – 2015-04-21 (×15): 3 mL via RESPIRATORY_TRACT
  Filled 2015-04-17 (×16): qty 3

## 2015-04-17 MED ORDER — FUROSEMIDE 10 MG/ML IJ SOLN: 20.0000 mg | Freq: Once | INTRAMUSCULAR | Status: DC

## 2015-04-17 MED ORDER — HALOPERIDOL LACTATE 5 MG/ML IJ SOLN
2.5000 mg | Freq: Once | INTRAMUSCULAR | Status: AC
  Administered 2015-04-17: 2.5 mg via INTRAVENOUS
  Filled 2015-04-17: qty 1

## 2015-04-17 MED ORDER — FUROSEMIDE 10 MG/ML IJ SOLN
40.0000 mg | Freq: Once | INTRAMUSCULAR | Status: AC
  Administered 2015-04-17: 40 mg via INTRAVENOUS
  Filled 2015-04-17: qty 4

## 2015-04-17 MED ORDER — IPRATROPIUM-ALBUTEROL 0.5-2.5 (3) MG/3ML IN SOLN: 3.0000 mL | RESPIRATORY_TRACT | Status: DC | PRN

## 2015-04-17 MED ORDER — LORAZEPAM 2 MG/ML IJ SOLN
0.5000 mg | Freq: Four times a day (QID) | INTRAMUSCULAR | Status: DC
  Administered 2015-04-17 – 2015-04-21 (×16): 0.5 mg via INTRAVENOUS
  Filled 2015-04-17 (×16): qty 1

## 2015-04-17 MED ORDER — METHYLPREDNISOLONE SODIUM SUCC 125 MG IJ SOLR
60.0000 mg | Freq: Four times a day (QID) | INTRAMUSCULAR | Status: DC
  Administered 2015-04-17 – 2015-04-18 (×4): 60 mg via INTRAVENOUS
  Filled 2015-04-17 (×4): qty 2

## 2015-04-17 MED ORDER — IPRATROPIUM-ALBUTEROL 0.5-2.5 (3) MG/3ML IN SOLN
3.0000 mL | RESPIRATORY_TRACT | Status: DC
  Administered 2015-04-17: 3 mL via RESPIRATORY_TRACT
  Filled 2015-04-17: qty 3

## 2015-04-17 NOTE — Progress Notes (Signed)
Patient is combative (ex. hitting staff and pulling at lines) with staff and had used foul language with family. CPT with bed not attempted at this time and patient decline nebulizer treatment as well.

## 2015-04-17 NOTE — Progress Notes (Signed)
Pt attempting to get out of bed and pull all lines off. Wife at bedside. IV site cleaned and dressing changed. Bed alarm on. Will continue to monitor.

## 2015-04-17 NOTE — Care Management Note (Signed)
Case Management Note  Patient Details  Name: Brent Franco MRN: [id number] Date of Birth: 1950-11-20  Subjective/Objective:                  Pt admitted for sepsis. Pt is from home, lives with wife and has strong support from daughters. Pt is ind with ADL's but does not currently drive. Pt has no HH services or DME prior to admission. Daughters express concern for extreme weakness prior to admission. Pt plans to return home at DC. Pt will need PT eval prior to DC to determine needs at home. Anticipate need for [facility] PT.   Action/Plan: Will cont to follow for DC planning.   Expected Discharge Date:      04/20/2015            Expected Discharge Plan:  [place]  In-House Referral:  NA  Discharge planning Services  CM Consult  Post Acute Care Choice:  Home Health Choice offered to:  Patient  DME Arranged:    DME Agency:     HH Arranged:    [place] Agency:     Status of Service:  In process, will continue to follow  Medicare Important Message Given:    Date Medicare IM Given:    Medicare IM give by:    Date Additional Medicare IM Given:    Additional Medicare Important Message give by:     If discussed at [place] of Stay Meetings, dates discussed:    Additional Comments:  Sherald Barge, RN 04/17/2015, 2:51 PM

## 2015-04-17 NOTE — Progress Notes (Signed)
Upon entering room, pt complaining of IV being painful.  Upon assessing site, leaking and IV pulled most of the way out.  Paused IV fluids at this time, will try to obtain new IV access.

## 2015-04-17 NOTE — Progress Notes (Signed)
[facility] PROGRESS NOTE  Brent Franco [id number] DOB: [birth year] DOA: 04/15/2015 PCP: [name], NP  Assessment/Plan: 1. Sepsis: blood pressures have normalized, will continue IVF of NS at 133ml/hr, Continue Vancomycin and Zosyn, Blood cultures no growth x 1 day, Strep pneumo urinary antigen negative, legionella urinary antigen pending, lactic acid improved, IV access pulled last night- will access port a catheter  2. Delirium: etiology likely mulitfactorial will get ABG to rule out hypercarbia as patient has a diagnosis of COPD  3. Acute kidney injury: IVF continued once port accessed, BUN/Cr improved.  4. Recent diagnosis of esophageal cancer: Dr. Whitney Muse placed on patients treatment team.  Further staging of cancer to be done outpatient via endoscopic ultrasound; patient has chronic pain- may need to consider placing back on the oxycontin with breakthrough oxycodone, IV ativan ordered for patient  5. Diabetes mellitus: holding metformin, SSI, blood sugars relatively well controlled- one hyperglycemia episode noted of 350  6. DVT prophylaxis: Lovenox  Code Status: changed to full code this morning Family Communication: wife at bedside Disposition Plan: guarded, will follow respiratory status carefully   Consultants:  None  Procedures:  none  Antibiotics:  Zosyn and Vancomycin 04/15/15  HPI/Subjective: 65 year old male who  has a past medical history of COPD (chronic obstructive pulmonary disease) ([place]); ETOH abuse; Depression; Arthritis; Asthma; Viral hepatitis C; Diabetes mellitus without complication ([place]); Hypothyroidism; Cancer ([place]); Pneumonia (02/2015); and Itching. Yesterday presented to the hospital for increasing confusion, cough. As per patient's wife he has been sick over the past few days and has been coughing clear phlegm. Patient was recently diagnosed with esophageal cancer, and is supposed to undergo MRI of the abdomen as well as endoscopic ultrasound  to further stage the cancer. The biopsy confirmed esophageal cancer. Patient has been running fever as high as 102 at home. Also complains of chills. Coughing up phlegm. No chest pain. Wife called cancer Center and was told to come to the ED for further evaluation. In the ED patient was found to be hypoxic and confused. Chest x-ray showed pneumonia. Patient started on vancomycin and Zosyn. Lab work in the ED revealed creatinine 1.63, lactic acid 3.03, white count 18.2.  Overnight patient was delirious and agitated, pulling at IV lines and trying to get out of bed.  Patient states he does somewhat remember these episodes.  Per patient he is hungry and feels slightly better.  He is oriented to place, situation but could not recall the month.     Objective: Filed Vitals:   04/17/15 0300 04/17/15 0400  BP: 131/85 127/80  Pulse: 99 104  Temp:  98.2 F (36.8 C)  Resp: 23 27    Intake/Output Summary (Last 24 hours) at 04/17/15 0800 Last data filed at 04/17/15 0500  Gross per 24 hour  Intake   2425 ml  Output   1475 ml  Net    950 ml   Filed Weights   04/15/15 1837 04/16/15 0500 04/17/15 0500  Weight: 89.5 kg (197 lb 5 oz) 89.3 kg (196 lb 13.9 oz) 89.8 kg (197 lb 15.6 oz)    Exam:   General:  Middle aged male that appears acutely ill, mild distress, laying in bed  Cardiovascular: S1S2, RRR, no murmurs or gallops, 2+ pulses radial and dorsalis pedis bilaterally  Respiratory: rales and rhonchi basilar and middle lung on left, rhonchi on the right, improved aeration but slight end expiratory wheezing appreciated  Abdomen: soft, nontender, nondistended, bowel sounds in all quadrants  Musculoskeletal: FROM  UE and LE bilaterally   Data Reviewed: Basic Metabolic Panel:  Recent Labs Lab 04/15/15 1612 04/16/15 0436 04/17/15 0540  NA 138 139 140  K 5.0 4.4 4.1  CL 98* 105 103  CO2 30 29 30   GLUCOSE 177* 148* 150*  BUN 28* 17 9  CREATININE 1.63* 0.97 0.71  CALCIUM 8.7* 7.3* 7.4*    Liver Function Tests:  Recent Labs Lab 04/15/15 1612 04/16/15 0436  AST 78* 61*  ALT 72* 53  ALKPHOS 74 64  BILITOT 0.7 0.6  PROT 7.6 6.1*  ALBUMIN 3.6 2.7*   No results for input(s): LIPASE, AMYLASE in the last 168 hours. No results for input(s): AMMONIA in the last 168 hours. CBC:  Recent Labs Lab 04/15/15 1612 04/16/15 0436 04/17/15 0540  WBC 18.2* 13.4* 10.4  HGB 10.9* 9.3* 9.2*  HCT 34.2* 29.9* 29.2*  MCV 93.4 94.9 95.4  PLT 214 161 159   Cardiac Enzymes: No results for input(s): CKTOTAL, CKMB, CKMBINDEX, TROPONINI in the last 168 hours. BNP (last 3 results) No results for input(s): BNP in the last 8760 hours.  ProBNP (last 3 results) No results for input(s): PROBNP in the last 8760 hours.  CBG:  Recent Labs Lab 04/16/15 0730 04/16/15 1122 04/16/15 1631 04/16/15 2110 04/17/15 0727  GLUCAP 128* 119* 178* 96 114*    Recent Results (from the past 240 hour(s))  Culture, blood (Routine X 2) w Reflex to ID Panel     Status: None (Preliminary result)   Collection Time: 04/15/15  4:12 PM  Result Value Ref Range Status   Specimen Description BLOOD LEFT ANTECUBITAL DRAWN BY RN  Final   Special Requests BOTTLES DRAWN AEROBIC AND ANAEROBIC 4CC EACH  Final   Culture NO GROWTH 1 DAY  Final   Report Status PENDING  Incomplete  Culture, blood (Routine X 2) w Reflex to ID Panel     Status: None (Preliminary result)   Collection Time: 04/15/15  4:13 PM  Result Value Ref Range Status   Specimen Description BLOOD RIGHT ANTECUBITAL DRAWN BY RN  Final   Special Requests BOTTLES DRAWN AEROBIC AND ANAEROBIC 4CC EACH  Final   Culture NO GROWTH 1 DAY  Final   Report Status PENDING  Incomplete  MRSA PCR Screening     Status: None   Collection Time: 04/15/15  6:30 PM  Result Value Ref Range Status   MRSA by PCR NEGATIVE NEGATIVE Final    Comment:        The GeneXpert MRSA Assay (FDA approved for NASAL specimens only), is one component of a comprehensive MRSA  colonization surveillance program. It is not intended to diagnose MRSA infection nor to guide or monitor treatment for MRSA infections.      Studies: Dg Chest Portable 1 View  04/15/2015  CLINICAL DATA:  Cough and fever.  Esophageal cancer EXAM: PORTABLE CHEST 1 VIEW COMPARISON:  03/23/2015 FINDINGS: Progression of left lower lobe infiltrate may represent recurrent pneumonia. No significant effusion. Right lung clear. Port-A-Cath tip in the SVC. Negative for heart failure. IMPRESSION: Progression of left lower lobe infiltrate since prior study which may represent recurrent pneumonia. Electronically Signed   By: Franchot Gallo M.D.   On: 04/15/2015 16:32    Scheduled Meds: . docusate sodium  100 mg Oral BID  . enoxaparin (LOVENOX) injection  40 mg Subcutaneous Q24H  . FLUoxetine  10 mg Oral Daily  . insulin aspart  0-9 Units Subcutaneous TID WC  . levothyroxine  25 mcg Oral  QAC breakfast  . nystatin  5 mL Oral QID  . piperacillin-tazobactam (ZOSYN)  IV  3.375 g Intravenous Q8H  . pneumococcal 23 valent vaccine  0.5 mL Intramuscular Tomorrow-1000  . vancomycin  1,000 mg Intravenous Q12H   Continuous Infusions: . sodium chloride Stopped (04/17/15 0730)    Active Problems:   Sepsis ([place])   Pneumonia   Healthcare-associated pneumonia    Time spent: 7 minutes    [facility] Pager [phone]. If 7PM-7AM, please contact night-coverage at [email or web address], password [facility] 04/17/2015, 8:00 AM  LOS: 2 days

## 2015-04-17 NOTE — Progress Notes (Signed)
Pt confused and has pulled all lines off and IV out. Wife is at bedside. New IV placed. Condom cath put on per wife's request and mitts. Pt has no signs of distress at this time. Will continue to monitor.

## 2015-04-17 NOTE — Progress Notes (Signed)
Pt confused. Attempting to get out of bed and pull all lines off. Wife at bedside. Bed alarm on. Will continue to monitor.

## 2015-04-17 NOTE — Progress Notes (Signed)
[facility] Psychosocial Distress Screening Clinical Social Work  Clinical Social Work was referred by distress screening protocol.  The patient scored a 9 on the Psychosocial Distress Thermometer which indicates severe distress. Clinical Social Worker reviewed chart and discussed case with RN navigator to assess for distress and other psychosocial needs. Pt currently admitted to inpt at [facility]. Pt has long standing h/o depression and appears to be prescribed prozac to address this concern. CSW will attempt to follow up at future [facility] appointments to further review needs.   ONCBCN DISTRESS SCREENING 04/06/2015  Screening Type Initial Screening  Distress experienced in past week (1-10) 9  Emotional problem type Depression;Nervousness/Anxiety;Adjusting to illness;Isolation/feeling alone;Feeling hopeless;Boredom;Adjusting to appearance changes  Information Concerns Type Lack of info about diagnosis;Lack of info about treatment;Lack of info about complementary therapy choices;Lack of info about maintaining fitness  Physical Problem type Pain;Sleep/insomnia;Getting around;Bathing/dressing;Breathing;Mouth sores/swallowing;Skin dry/itchy  Physician notified of physical symptoms Yes  Referral to clinical social work Yes  Referral to dietition Yes  Other (No Data)    Clinical Social Worker follow up needed: Yes.    If yes, follow up plan: See above Loren Racer, [place] Worker [place]  [facility] Phone: [phone] Fax: [phone]

## 2015-04-18 ENCOUNTER — Telehealth: Payer: Self-pay | Admitting: Gastroenterology

## 2015-04-18 DIAGNOSIS — J189 Pneumonia, unspecified organism: Secondary | ICD-10-CM

## 2015-04-18 DIAGNOSIS — A419 Sepsis, unspecified organism: Principal | ICD-10-CM

## 2015-04-18 LAB — CBC
HCT: 32.5 % — ABNORMAL LOW (ref 39.0–52.0)
HEMOGLOBIN: 10.2 g/dL — AB (ref 13.0–17.0)
MCH: 29.4 pg (ref 26.0–34.0)
MCHC: 31.4 g/dL (ref 30.0–36.0)
MCV: 93.7 fL (ref 78.0–100.0)
Platelets: 201 10*3/uL (ref 150–400)
RBC: 3.47 MIL/uL — ABNORMAL LOW (ref 4.22–5.81)
RDW: 14.8 % (ref 11.5–15.5)
WBC: 8.5 10*3/uL (ref 4.0–10.5)

## 2015-04-18 LAB — GLUCOSE, CAPILLARY
GLUCOSE-CAPILLARY: 233 mg/dL — AB (ref 65–99)
GLUCOSE-CAPILLARY: 241 mg/dL — AB (ref 65–99)
GLUCOSE-CAPILLARY: 360 mg/dL — AB (ref 65–99)
GLUCOSE-CAPILLARY: 283 mg/dL — AB (ref 65–99)

## 2015-04-18 LAB — COMPREHENSIVE METABOLIC PANEL
ALBUMIN: 2.6 g/dL — AB (ref 3.5–5.0)
ALK PHOS: 64 U/L (ref 38–126)
ALT: 47 U/L (ref 17–63)
ANION GAP: 10 (ref 5–15)
AST: 51 U/L — AB (ref 15–41)
BILIRUBIN TOTAL: 0.8 mg/dL (ref 0.3–1.2)
BUN: 12 mg/dL (ref 6–20)
CALCIUM: 7.7 mg/dL — AB (ref 8.9–10.3)
CO2: 32 mmol/L (ref 22–32)
Chloride: 101 mmol/L (ref 101–111)
Creatinine, Ser: 0.7 mg/dL (ref 0.61–1.24)
GFR calc Af Amer: >60 mL/min (ref >60)
GFR calc non Af Amer: >60 mL/min (ref >60)
GLUCOSE: 181 mg/dL — AB (ref 65–99)
Potassium: 3.7 mmol/L (ref 3.5–5.1)
SODIUM: 143 mmol/L (ref 135–145)
TOTAL PROTEIN: 6.7 g/dL (ref 6.5–8.1)

## 2015-04-18 LAB — VANCOMYCIN, TROUGH: VANCOMYCIN TR: 9 ug/mL — AB (ref 10.0–20.0)

## 2015-04-18 MED ORDER — METHYLPREDNISOLONE SODIUM SUCC 125 MG IJ SOLR
60.0000 mg | Freq: Two times a day (BID) | INTRAMUSCULAR | Status: DC
  Administered 2015-04-18 – 2015-04-19 (×2): 60 mg via INTRAVENOUS
  Filled 2015-04-18 (×2): qty 2

## 2015-04-18 MED ORDER — VANCOMYCIN HCL IN DEXTROSE 1-5 GM/200ML-% IV SOLN: 1000.0000 mg | Freq: Three times a day (TID) | INTRAVENOUS | Status: DC

## 2015-04-18 NOTE — Progress Notes (Signed)
Pharmacy Antibiotic Note  Brent Franco is a 65 y.o. male admitted on 04/15/2015 with sepsis.  Pharmacy has been consulted for vancomycin and zosyn dosing. MRSA PCR (-), trough level below goal. SCr stable. Blood cx's pending, no growth so far  Plan: Increase Vancomycin to 1000mg  IV q8h Continue Zosyn 3.375 gm IV q8 hours F/u renal function, cultures and progress Deescalate ABX when appropriate / improved  RECENT VANC TROUGH = 9 on 1000mg  IV q12hrs Dose increased on 2/8  Height: 6\' 1"  (185.4 cm) Weight: 185 lb 6.5 oz (84.1 kg) IBW/kg (Calculated) : 79.9  Temp (24hrs), Avg:97.1 F (36.2 C), Min:97 F (36.1 C), Max:97.3 F (36.3 C)   Recent Labs Lab 04/15/15 1612 04/15/15 1619 04/15/15 1839 04/15/15 2135 04/16/15 0436 04/17/15 0540 04/18/15 0400  WBC 18.2*  --   --   --  13.4* 10.4 8.5  CREATININE 1.63*  --   --   --  0.97 0.71 0.70  LATICACIDVEN  --  3.03* 1.4 2.0  --   --   --   VANCOTROUGH  --   --   --   --   --   --  9*    Estimated Creatinine Clearance: 106.8 mL/min (by C-G formula based on Cr of 0.7).    Allergies  Allergen Reactions  . Demerol [Meperidine] Rash   Antimicrobials this admission: Vancomycin  2/5  >>  Zosyn  2/5 >>   Pharmacokinetic dosing service    Vancomycin single level analysis: Current dose being given: 1000 mg Current dosing interval:  12 hrs Current infusion time (hrs): 1  Single level Trough Data:  Trough level obtained: 9 mcg/ml Timing of trough - Number of hours since last dose:  12 Hrs  Estimated PK Parameters: --------------------------- New rate constant (kel): 0.088 hr-1 New half-life: 7.88 Hours New Vd from levels: 58.87  Liters  (0.7 L/kg)  Recommendations: ==================== Give Vancomycin  1000 mg  q 8 hrs. Infuse over 1 hrs Expected Cpeak: 32.2 mcg/ml    Expected Ctrough: 17.4 mcg/ml  Recommended labs and intervals: Measure Bun and Scr 3 times/week.   Thank you for the consult, will continue to  follow. Thank you for allowing pharmacy to be a part of this patient's care.  Hart Robinsons A 04/18/2015 8:31 AM

## 2015-04-18 NOTE — Progress Notes (Signed)
[facility] PROGRESS NOTE  Brent Franco [id number] DOB: 03-Jul-1950 DOA: 04/15/2015 PCP: [name], NP  Assessment/Plan: Sepsis -Secondary to pneumonia. -Sepsis parameters have normalized, blood cultures remain negative to date. -Transfer to regular bed.  Healthcare associated pneumonia -So far culture data remains negative. -Strep pneumo and Legionella urine antigen negative, blood cultures are negative. Since MRSA PCR is negative, will DC vancomycin and continue Zosyn only for now. Can transition to oral antibiotics in a.m. if remains stable.  Acute renal failure -Resolved with IV fluids.  Delirium -Much improved today. Continue to monitor.  Diabetes mellitus -Follow CBGs and adjust insulin as required.  Recent diagnosis of esophageal cancer -Continue outpatient follow-up scheduled.  Acute urinary retention -Status post placement of Foley catheter. -Was likely related to his delirium which has improved today, will discontinue Foley catheter and attempt voiding trial.  Code Status: Full code Family Communication: Patient only  Disposition Plan: Transfer to floor   Consultants:  None   Antibiotics:  Zosyn   Subjective: States he feels terrible with pain all over, does not complain of shortness of breath or chest pain  Objective: Filed Vitals:   04/18/15 0700 04/18/15 0900 04/18/15 0903 04/18/15 1000  BP: 122/50 124/73  114/91  Pulse: 98 111  111  Temp:      TempSrc:      Resp: 21 28  16   Height:      Weight:      SpO2: 96% 92% 94% 92%    Intake/Output Summary (Last 24 hours) at 04/18/15 1038 Last data filed at 04/18/15 0700  Gross per 24 hour  Intake 1591.67 ml  Output   2950 ml  Net -1358.33 ml   Filed Weights   04/16/15 0500 04/17/15 0500 04/18/15 0500  Weight: 89.3 kg (196 lb 13.9 oz) 89.8 kg (197 lb 15.6 oz) 84.1 kg (185 lb 6.5 oz)    Exam:   General:  Alert, awake, oriented 3  Cardiovascular: Regular rate and  rhythm  Respiratory: No crackles or wheezes, some rhonchi bilaterally  Abdomen: Soft, nontender, nondistended, positive bowel sounds  Extremities: No clubbing, cyanosis or edema, positive pulses   Neurologic:  Grossly intact and nonfocal  Data Reviewed: Basic Metabolic Panel:  Recent Labs Lab 04/15/15 1612 04/16/15 0436 04/17/15 0540 04/18/15 0400  NA 138 139 140 143  K 5.0 4.4 4.1 3.7  CL 98* 105 103 101  CO2 30 29 30  32  GLUCOSE 177* 148* 150* 181*  BUN 28* 17 9 12   CREATININE 1.63* 0.97 0.71 0.70  CALCIUM 8.7* 7.3* 7.4* 7.7*   Liver Function Tests:  Recent Labs Lab 04/15/15 1612 04/16/15 0436 04/18/15 0400  AST 78* 61* 51*  ALT 72* 53 47  ALKPHOS 74 64 64  BILITOT 0.7 0.6 0.8  PROT 7.6 6.1* 6.7  ALBUMIN 3.6 2.7* 2.6*   No results for input(s): LIPASE, AMYLASE in the last 168 hours. No results for input(s): AMMONIA in the last 168 hours. CBC:  Recent Labs Lab 04/15/15 1612 04/16/15 0436 04/17/15 0540 04/18/15 0400  WBC 18.2* 13.4* 10.4 8.5  HGB 10.9* 9.3* 9.2* 10.2*  HCT 34.2* 29.9* 29.2* 32.5*  MCV 93.4 94.9 95.4 93.7  PLT 214 161 159 201   Cardiac Enzymes: No results for input(s): CKTOTAL, CKMB, CKMBINDEX, TROPONINI in the last 168 hours. BNP (last 3 results) No results for input(s): BNP in the last 8760 hours.  ProBNP (last 3 results) No results for input(s): PROBNP in the last 8760 hours.  CBG:  Recent Labs Lab 04/17/15 0727 04/17/15 1116 04/17/15 1509 04/17/15 2140 04/18/15 0744  GLUCAP 114* 122* 160* 181* 233*    Recent Results (from the past 240 hour(s))  Culture, blood (Routine X 2) w Reflex to ID Panel     Status: None (Preliminary result)   Collection Time: 04/15/15  4:12 PM  Result Value Ref Range Status   Specimen Description BLOOD LEFT ANTECUBITAL DRAWN BY RN  Final   Special Requests BOTTLES DRAWN AEROBIC AND ANAEROBIC 4CC EACH  Final   Culture NO GROWTH 3 DAYS  Final   Report Status PENDING  Incomplete  Culture,  blood (Routine X 2) w Reflex to ID Panel     Status: None (Preliminary result)   Collection Time: 04/15/15  4:13 PM  Result Value Ref Range Status   Specimen Description BLOOD RIGHT ANTECUBITAL DRAWN BY RN  Final   Special Requests BOTTLES DRAWN AEROBIC AND ANAEROBIC 4CC EACH  Final   Culture NO GROWTH 3 DAYS  Final   Report Status PENDING  Incomplete  MRSA PCR Screening     Status: None   Collection Time: 04/15/15  6:30 PM  Result Value Ref Range Status   MRSA by PCR NEGATIVE NEGATIVE Final    Comment:        The GeneXpert MRSA Assay (FDA approved for NASAL specimens only), is one component of a comprehensive MRSA colonization surveillance program. It is not intended to diagnose MRSA infection nor to guide or monitor treatment for MRSA infections.      Studies: Dg Chest Port 1 View  04/17/2015  CLINICAL DATA:  Fever and confusion with shortness of Breath EXAM: PORTABLE CHEST 1 VIEW COMPARISON:  04/15/2015 FINDINGS: Cardiac shadow is stable. Right chest wall port is again seen and stable. Name patchy changes are noted bilaterally predominately in a perihilar distribution suggestive of CHF. This may however represent multi focus pneumonias. Continued followup is recommended. IMPRESSION: New multifocal patchy infiltrates bilaterally as described. Electronically Signed   By: Inez Catalina M.D.   On: 04/17/2015 16:17    Scheduled Meds: . docusate sodium  100 mg Oral BID  . enoxaparin (LOVENOX) injection  40 mg Subcutaneous Q24H  . FLUoxetine  10 mg Oral Daily  . insulin aspart  0-9 Units Subcutaneous TID WC  . ipratropium-albuterol  3 mL Nebulization Q4H WA  . levothyroxine  25 mcg Oral QAC breakfast  . LORazepam  0.5 mg Intravenous Q6H  . methylPREDNISolone (SOLU-MEDROL) injection  60 mg Intravenous Q6H  . nystatin  5 mL Oral QID  . piperacillin-tazobactam (ZOSYN)  IV  3.375 g Intravenous Q8H  . pneumococcal 23 valent vaccine  0.5 mL Intramuscular Tomorrow-1000   Continuous  Infusions:   Active Problems:   COPD (chronic obstructive pulmonary disease) (H[facility])   Sepsis ([facility])   Pneumonia   Healthcare-associated pneumonia   SOB (shortness of breath)    Time spent: 25 minutes. Greater than 50% of this time was spent in direct contact with the patient coordinating care.    [name]  [facility] Pager [phone]  If 7PM-7AM, please contact night-coverage at [email or web address], password [facility] 04/18/2015, 10:38 AM  LOS: 3 days

## 2015-04-18 NOTE — Telephone Encounter (Signed)
Pt appt has been cx,

## 2015-04-18 NOTE — Progress Notes (Signed)
Pt a/o.vss. PAC patent. No complaints of any distress. Family visiting at the bedside. Report given to C. Morris, Therapist, sports. Pt to be transferred to room 318 via wheelchair with nursing staff.

## 2015-04-19 ENCOUNTER — Ambulatory Visit (HOSPITAL_COMMUNITY): Admission: RE | Admit: 2015-04-19 | Payer: Medicaid Other | Source: Ambulatory Visit | Admitting: Gastroenterology

## 2015-04-19 ENCOUNTER — Telehealth (HOSPITAL_COMMUNITY): Payer: Self-pay | Admitting: Emergency Medicine

## 2015-04-19 ENCOUNTER — Other Ambulatory Visit: Payer: Self-pay

## 2015-04-19 ENCOUNTER — Encounter (HOSPITAL_COMMUNITY): Admission: RE | Payer: Self-pay | Source: Ambulatory Visit

## 2015-04-19 HISTORY — DX: Pruritus, unspecified: L29.9

## 2015-04-19 LAB — CBC
HCT: 30.4 % — ABNORMAL LOW (ref 39.0–52.0)
Hemoglobin: 9.6 g/dL — ABNORMAL LOW (ref 13.0–17.0)
MCH: 29 pg (ref 26.0–34.0)
MCHC: 31.6 g/dL (ref 30.0–36.0)
MCV: 91.8 fL (ref 78.0–100.0)
PLATELETS: 251 10*3/uL (ref 150–400)
RBC: 3.31 MIL/uL — ABNORMAL LOW (ref 4.22–5.81)
RDW: 14.9 % (ref 11.5–15.5)
WBC: 15.8 10*3/uL — ABNORMAL HIGH (ref 4.0–10.5)

## 2015-04-19 LAB — GLUCOSE, CAPILLARY
GLUCOSE-CAPILLARY: 217 mg/dL — AB (ref 65–99)
Glucose-Capillary: 222 mg/dL — ABNORMAL HIGH (ref 65–99)
Glucose-Capillary: 240 mg/dL — ABNORMAL HIGH (ref 65–99)
Glucose-Capillary: 242 mg/dL — ABNORMAL HIGH (ref 65–99)

## 2015-04-19 LAB — BASIC METABOLIC PANEL
Anion gap: 8 (ref 5–15)
BUN: 14 mg/dL (ref 6–20)
CO2: 33 mmol/L — ABNORMAL HIGH (ref 22–32)
Calcium: 7.8 mg/dL — ABNORMAL LOW (ref 8.9–10.3)
Chloride: 101 mmol/L (ref 101–111)
Creatinine, Ser: 0.68 mg/dL (ref 0.61–1.24)
GFR calc Af Amer: >60 mL/min (ref >60)
GFR calc non Af Amer: >60 mL/min (ref >60)
Glucose, Bld: 253 mg/dL — ABNORMAL HIGH (ref 65–99)
Potassium: 3.7 mmol/L (ref 3.5–5.1)
Sodium: 142 mmol/L (ref 135–145)

## 2015-04-19 SURGERY — UPPER ENDOSCOPIC ULTRASOUND (EUS) RADIAL
Anesthesia: Monitor Anesthesia Care

## 2015-04-19 MED ORDER — PREDNISONE 20 MG PO TABS
50.0000 mg | ORAL_TABLET | Freq: Every day | ORAL | Status: DC
  Administered 2015-04-20 – 2015-04-21 (×2): 50 mg via ORAL
  Filled 2015-04-19 (×2): qty 2

## 2015-04-19 MED ORDER — AMOXICILLIN-POT CLAVULANATE 875-125 MG PO TABS
1.0000 | ORAL_TABLET | Freq: Two times a day (BID) | ORAL | Status: DC
  Administered 2015-04-19 – 2015-04-21 (×4): 1 via ORAL
  Filled 2015-04-19 (×4): qty 1

## 2015-04-19 NOTE — Plan of Care (Signed)
Problem: Coping: Goal: Verbalizations of decreased anxiety will increase Outcome: Not Progressing Patient has been confused and anxious during nights.

## 2015-04-19 NOTE — Progress Notes (Signed)
Patient has been confused and agitated all during night.  Patient bed was switched for patient safety ( bed alarm needed since a high fall risk). Patient has stated he wants to go to home and he needs to begin his chemotherapy treatment. Explained to patient that no treatment would be started since he was septic, and explained to patient the seriousness of sepsis, although patient did not seem to show much understanding.  Patient wife present all during night.  Offered to switch patient to another bed, he refused, placed patient in chair, does not stay in chair long. Will continue to monitor patient.

## 2015-04-19 NOTE — Progress Notes (Signed)
Results for ANTWAND, AROSTEGUI (MRN [id number]) as of 04/19/2015 13:15  Ref. Range 04/18/2015 11:22 04/18/2015 16:13 04/18/2015 20:56 04/19/2015 07:45 04/19/2015 11:58  Glucose-Capillary Latest Ref Range: 65-99 mg/dL 241 (H) 360 (H) 283 (H) 222 (H) 217 (H)  Noted that blood sugars continue to be greater than 180 mg/dl. Recommend increasing Novolog correction scale to MODERATE TID & HS. Will continue to monitor while in the hospital. Harvel Ricks RN BSN CDE

## 2015-04-19 NOTE — Progress Notes (Signed)
[facility] PROGRESS NOTE  Brent Franco [id number] DOB: 05-16-1950 DOA: 04/15/2015 PCP: [name], NP  Assessment/Plan: Sepsis -Secondary to pneumonia. -Sepsis parameters have normalized, blood cultures remain negative to date. -Transfer to regular bed.  Healthcare associated pneumonia -So far culture data remains negative. -Strep pneumo and Legionella urine antigen negative, blood cultures are negative. -We will transition to oral antibiotics today, will place on Augmentin for 5 more days.  Acute renal failure -Resolved with IV fluids.  Delirium -Significant sundowning overnight. Is still a little confused today although improved. -We'll minimize medications  Diabetes mellitus -Follow CBGs and adjust insulin as required.  Recent diagnosis of esophageal cancer -Continue outpatient follow-up scheduled.  Acute urinary retention -Foley was removed 2/8. -Has not been able to void today, suspect related to delirium. -Have asked nursing staff to stand him up in a few hours to see if he can urinate, if postvoid residuals are over 350, may necessitate replacement of Foley catheter and outpatient follow-up with urology.  Code Status: Full code Family Communication: Wife [name] at bedside and updated on plan of care Disposition Plan: To be determined   Consultants:  None   Antibiotics:  Augmentin  Subjective: Significant sundowning and delirium overnight. Wife states he was seeing rabbits on the wall and thought that his car was parked inside his hospital room.   Objective: Filed Vitals:   04/18/15 1628 04/18/15 2100 04/19/15 0456 04/19/15 0844  BP:  126/74 134/77   Pulse:  94 96   Temp: 97.4 F (36.3 C) 97.8 F (36.6 C) 97.5 F (36.4 C)   TempSrc: Oral Oral Oral   Resp:  20 17   Height:      Weight:      SpO2:  97% 95% 95%    Intake/Output Summary (Last 24 hours) at 04/19/15 1040 Last data filed at 04/18/15 1840  Gross per 24 hour  Intake     240 ml  Output    600 ml  Net   -360 ml   Filed Weights   04/16/15 0500 04/17/15 0500 04/18/15 0500  Weight: 89.3 kg (196 lb 13.9 oz) 89.8 kg (197 lb 15.6 oz) 84.1 kg (185 lb 6.5 oz)    Exam:   General:  Alert, awake, oriented 3 at present although still states he has some hallucinations   Cardiovascular: Regular rate and rhythm  Respiratory: No crackles or wheezes, some rhonchi bilaterally  Abdomen: Soft, nontender, nondistended, positive bowel sounds  Extremities: No clubbing, cyanosis or edema, positive pulses   Neurologic:  Grossly intact and nonfocal  Data Reviewed: Basic Metabolic Panel:  Recent Labs Lab 04/15/15 1612 04/16/15 0436 04/17/15 0540 04/18/15 0400 04/19/15 0626  NA 138 139 140 143 142  K 5.0 4.4 4.1 3.7 3.7  CL 98* 105 103 101 101  CO2 30 29 30  32 33*  GLUCOSE 177* 148* 150* 181* 253*  BUN 28* 17 9 12 14   CREATININE 1.63* 0.97 0.71 0.70 0.68  CALCIUM 8.7* 7.3* 7.4* 7.7* 7.8*   Liver Function Tests:  Recent Labs Lab 04/15/15 1612 04/16/15 0436 04/18/15 0400  AST 78* 61* 51*  ALT 72* 53 47  ALKPHOS 74 64 64  BILITOT 0.7 0.6 0.8  PROT 7.6 6.1* 6.7  ALBUMIN 3.6 2.7* 2.6*   No results for input(s): LIPASE, AMYLASE in the last 168 hours. No results for input(s): AMMONIA in the last 168 hours. CBC:  Recent Labs Lab 04/15/15 1612 04/16/15 0436 04/17/15 0540 04/18/15 0400 04/19/15 [id number]  WBC 18.2* 13.4* 10.4 8.5 15.8*  HGB 10.9* 9.3* 9.2* 10.2* 9.6*  HCT 34.2* 29.9* 29.2* 32.5* 30.4*  MCV 93.4 94.9 95.4 93.7 91.8  PLT 214 161 159 201 251   Cardiac Enzymes: No results for input(s): CKTOTAL, CKMB, CKMBINDEX, TROPONINI in the last 168 hours. BNP (last 3 results) No results for input(s): BNP in the last 8760 hours.  ProBNP (last 3 results) No results for input(s): PROBNP in the last 8760 hours.  CBG:  Recent Labs Lab 04/18/15 0744 04/18/15 1122 04/18/15 1613 04/18/15 2056 04/19/15 0745  GLUCAP 233* 241* 360* 283* 222*      Recent Results (from the past 240 hour(s))  Culture, blood (Routine X 2) w Reflex to ID Panel     Status: None (Preliminary result)   Collection Time: 04/15/15  4:12 PM  Result Value Ref Range Status   Specimen Description BLOOD LEFT ANTECUBITAL DRAWN BY RN  Final   Special Requests BOTTLES DRAWN AEROBIC AND ANAEROBIC 4CC EACH  Final   Culture NO GROWTH 4 DAYS  Final   Report Status PENDING  Incomplete  Culture, blood (Routine X 2) w Reflex to ID Panel     Status: None (Preliminary result)   Collection Time: 04/15/15  4:13 PM  Result Value Ref Range Status   Specimen Description BLOOD RIGHT ANTECUBITAL DRAWN BY RN  Final   Special Requests BOTTLES DRAWN AEROBIC AND ANAEROBIC 4CC EACH  Final   Culture NO GROWTH 4 DAYS  Final   Report Status PENDING  Incomplete  MRSA PCR Screening     Status: None   Collection Time: 04/15/15  6:30 PM  Result Value Ref Range Status   MRSA by PCR NEGATIVE NEGATIVE Final    Comment:        The GeneXpert MRSA Assay (FDA approved for NASAL specimens only), is one component of a comprehensive MRSA colonization surveillance program. It is not intended to diagnose MRSA infection nor to guide or monitor treatment for MRSA infections.      Studies: Dg Chest Port 1 View  04/17/2015  CLINICAL DATA:  Fever and confusion with shortness of Breath EXAM: PORTABLE CHEST 1 VIEW COMPARISON:  04/15/2015 FINDINGS: Cardiac shadow is stable. Right chest wall port is again seen and stable. Name patchy changes are noted bilaterally predominately in a perihilar distribution suggestive of CHF. This may however represent multi focus pneumonias. Continued followup is recommended. IMPRESSION: New multifocal patchy infiltrates bilaterally as described. Electronically Signed   By: Inez Catalina M.D.   On: 04/17/2015 16:17    Scheduled Meds: . docusate sodium  100 mg Oral BID  . enoxaparin (LOVENOX) injection  40 mg Subcutaneous Q24H  . FLUoxetine  10 mg Oral Daily  .  insulin aspart  0-9 Units Subcutaneous TID WC  . ipratropium-albuterol  3 mL Nebulization Q4H WA  . levothyroxine  25 mcg Oral QAC breakfast  . LORazepam  0.5 mg Intravenous Q6H  . methylPREDNISolone (SOLU-MEDROL) injection  60 mg Intravenous Q12H  . nystatin  5 mL Oral QID  . piperacillin-tazobactam (ZOSYN)  IV  3.375 g Intravenous Q8H  . pneumococcal 23 valent vaccine  0.5 mL Intramuscular Tomorrow-1000   Continuous Infusions:   Active Problems:   COPD (chronic obstructive pulmonary disease) (HCC)   Sepsis (HCC)   Pneumonia   Healthcare-associated pneumonia   SOB (shortness of breath)    Time spent: 25 minutes. Greater than 50% of this time was spent in direct contact with the patient coordinating care.  [name]  [facility] Pager [phone]  If 7PM-7AM, please contact night-coverage at [email or web address], password [facility] 04/19/2015, 10:40 AM  LOS: 4 days

## 2015-04-19 NOTE — Progress Notes (Signed)
At 0940 pt complaint of chest pain with tingling left hand. BP 120/56 Hr 101 RR 22 O2 sat 96%.  Dr Jerilee Hoh notified 12 lead EKG ordered.

## 2015-04-19 NOTE — Telephone Encounter (Signed)
Called to update wife that once pt got out of the hospital that we would get him set back up with Dr Ardis Hughs. She was very appreciative for [place] calling her and explaining things to her

## 2015-04-19 NOTE — Progress Notes (Signed)
Family concerned about patient not having pain medication, Dr. Jerilee Hoh notified, returned with phone call.

## 2015-04-20 ENCOUNTER — Inpatient Hospital Stay (HOSPITAL_COMMUNITY): Payer: Medicaid Other

## 2015-04-20 ENCOUNTER — Ambulatory Visit (HOSPITAL_COMMUNITY): Payer: Medicaid Other | Admitting: Hematology & Oncology

## 2015-04-20 DIAGNOSIS — J438 Other emphysema: Secondary | ICD-10-CM

## 2015-04-20 LAB — GLUCOSE, CAPILLARY
GLUCOSE-CAPILLARY: 170 mg/dL — AB (ref 65–99)
GLUCOSE-CAPILLARY: 161 mg/dL — AB (ref 65–99)
Glucose-Capillary: 177 mg/dL — ABNORMAL HIGH (ref 65–99)
Glucose-Capillary: 271 mg/dL — ABNORMAL HIGH (ref 65–99)

## 2015-04-20 LAB — CULTURE, BLOOD (ROUTINE X 2)
Culture: NO GROWTH
Culture: NO GROWTH

## 2015-04-20 LAB — BASIC METABOLIC PANEL
Anion gap: 9 (ref 5–15)
BUN: 19 mg/dL (ref 6–20)
CO2: 33 mmol/L — ABNORMAL HIGH (ref 22–32)
Calcium: 8.1 mg/dL — ABNORMAL LOW (ref 8.9–10.3)
Chloride: 103 mmol/L (ref 101–111)
Creatinine, Ser: 0.81 mg/dL (ref 0.61–1.24)
GFR calc Af Amer: >60 mL/min (ref >60)
GFR calc non Af Amer: >60 mL/min (ref >60)
Glucose, Bld: 163 mg/dL — ABNORMAL HIGH (ref 65–99)
Potassium: 3.4 mmol/L — ABNORMAL LOW (ref 3.5–5.1)
Sodium: 145 mmol/L (ref 135–145)

## 2015-04-20 LAB — CBC
HCT: 33.1 % — ABNORMAL LOW (ref 39.0–52.0)
HEMOGLOBIN: 10.6 g/dL — AB (ref 13.0–17.0)
MCH: 29.8 pg (ref 26.0–34.0)
MCHC: 32 g/dL (ref 30.0–36.0)
MCV: 93 fL (ref 78.0–100.0)
Platelets: 262 10*3/uL (ref 150–400)
RBC: 3.56 MIL/uL — ABNORMAL LOW (ref 4.22–5.81)
RDW: 15 % (ref 11.5–15.5)
WBC: 13.4 10*3/uL — AB (ref 4.0–10.5)

## 2015-04-20 MED ORDER — ONDANSETRON HCL 4 MG/2ML IJ SOLN
4.0000 mg | Freq: Four times a day (QID) | INTRAMUSCULAR | Status: DC | PRN
  Administered 2015-04-20: 4 mg via INTRAVENOUS
  Filled 2015-04-20: qty 2

## 2015-04-20 NOTE — Plan of Care (Signed)
Problem: Acute Rehab PT Goals(only PT should resolve) Goal: Pt Will Perform Standing Balance Or Pre-Gait Pt will demonstrate improved static standing balance by maintaining tandem stance with each LE forward x30sec.

## 2015-04-20 NOTE — Evaluation (Signed)
Physical Therapy Evaluation Patient Details Name: TAVIOUS CARAHER MRN: [id number] DOB: [birth year] Today's Date: 04/20/2015   History of Present Illness  Pt is a 65yo M admitted to [facility] for increased confusion, SOB and hypoxia. He is otherwise indep at home with ADLs and other activities, stating he sometimes uses things for support when walking. PMH of COPD, sepsis, PNA, ETOH abuse, depression, arthritis, DM and esophageal cancer.   Clinical Impression  Pt was found in his bed with wife present at the beginning of the evaluation. He was A&O x3 and willing to participate in the session. He reported no pain. PLOF is reported to be indep with all activities. Pt demonstrated general weakness throughout the UE and LE as evident by MMT and patient's comparison to baseline. His overall mobility is good, however he required a RW to steady himself during ambulation. His SaO2 remained between 90-93% throughout the entire session without supplemental 02. He demonstrated increased postural sway and LOB with tandem stance as well as limited functional reach indicating slight impairments in his balance at this time. He could benefit from a RW for home use to improve his stability and decrease the need for assistance from his spouse. The pt would benefit from continued skilled PT services to increase his strength, mobility, balance and independence with activity when returning home.     Follow Up Recommendations No PT follow up    Equipment Recommendations  Rolling walker with 5" wheels    Recommendations for Other Services       Precautions / Restrictions Restrictions Weight Bearing Restrictions: No      Mobility  Bed Mobility Overal bed mobility: Independent             General bed mobility comments: Wife reports he has been going to the bathroom with her assistance  Transfers Overall transfer level: Modified independent Equipment used: Rolling walker (2 wheeled)             General  transfer comment: Pt used RW to steady himself during transfers  Ambulation/Gait Ambulation/Gait assistance: Min guard Ambulation Distance (Feet): 70 Feet Assistive device: Rolling walker (2 wheeled);None Gait Pattern/deviations: Decreased step length - left;Decreased step length - right   Gait velocity interpretation: <1.8 ft/sec, indicative of risk for recurrent falls General Gait Details: Pt ambulated 10' without AD and CGA, noting pt reaching for objects for additional stability. Wife reports that he aslo holds onto her when going to the bathroom  Stairs            Wheelchair Mobility    Modified Rankin (Stroke Patients Only)       Balance Overall balance assessment: Needs assistance Sitting-balance support: Feet supported Sitting balance-Leahy Scale: Normal         Standing balance comment: Pt demonstraiting good static standing balance except tandem stance noting increased postural sway and requiring therapist assistance to correct balance. Standing functional 7in indicating moderate risk of falls.      Tandem Stance - Right Leg: 10 Tandem Stance - Left Leg: 15 Rhomberg - Eyes Opened: 30 Rhomberg - Eyes Closed: 30                 Pertinent Vitals/Pain Pain Assessment: No/denies pain    Home Living Family/patient expects to be discharged to:: Private residence Living Arrangements: Spouse/significant other Available Help at Discharge: Family Type of Home: House Home Access: Level entry     Home Layout: One level Home Equipment: Hand held shower head  Prior Function Level of Independence: Independent               Hand Dominance        Extremity/Trunk Assessment   Upper Extremity Assessment: Overall WFL for tasks assessed;Generalized weakness           Lower Extremity Assessment: Overall WFL for tasks assessed;Generalized weakness         Communication   Communication: No difficulties  Cognition Arousal/Alertness:  Awake/alert Behavior During Therapy: WFL for tasks assessed/performed Overall Cognitive Status: Within Functional Limits for tasks assessed                 General Comments: Pt's wife noted that he was incorrectly recalling DME available at home.     General Comments      Exercises        Assessment/Plan    PT Assessment Patient needs continued PT services  PT Diagnosis Generalized weakness;Abnormality of gait   PT Problem List Decreased strength;Decreased activity tolerance;Decreased balance  PT Treatment Interventions Gait training;Functional mobility training;Therapeutic activities;Therapeutic exercise;Balance training;Patient/family education   PT Goals (Current goals can be found in the Care Plan section) Acute Rehab PT Goals Patient Stated Goal: want to go home PT Goal Formulation: With patient Time For Goal Achievement: 06/02/2015 Potential to Achieve Goals: Good    Frequency Min 3X/week   Barriers to discharge   None noted    Co-evaluation               End of Session Equipment Utilized During Treatment: Gait belt Activity Tolerance: No increased pain;Patient tolerated treatment well Patient left: in chair;with call bell/phone within reach;with family/visitor present Nurse Communication: Other (comment) (SaO2 levels)         Time: [id number] PT Time Calculation (min) (ACUTE ONLY): 33 min   Charges:   PT Evaluation $PT Eval Low Complexity: 1 Procedure PT Treatments $Therapeutic Activity: 23-37 mins   PT G Codes:         3:35 PM,June 02, 2015 Elly Modena PT, DPT [facility] [phone]

## 2015-04-20 NOTE — Progress Notes (Signed)
[facility] PROGRESS NOTE  CHIEF Brent Franco [id number] DOB: 1950/07/07 DOA: 04/15/2015 PCP: Brent Spry, NP  Assessment/Plan: Sepsis -Secondary to pneumonia. -Sepsis parameters have normalized, blood cultures remain negative to date. -Transfer to regular bed.  Healthcare associated pneumonia -So far culture data remains negative. -Strep pneumo and Legionella urine antigen negative, blood cultures are negative. -We will transition to oral antibiotics today, will place on Augmentin for 5 more days.  Acute renal failure -Resolved with IV fluids.  Delirium -Significant sundowning overnight. Is still a little confused today although improved. -We'll minimize medications  Diabetes mellitus -Follow CBGs and adjust insulin as required.  Recent diagnosis of esophageal cancer -Continue outpatient follow-up scheduled.  Acute urinary retention -Foley was removed 2/8. -No further difficulty.  Code Status: Full code Family Communication: Wife Brent Franco at bedside and updated on plan of care Disposition Plan: To be determined   Consultants:  None   Antibiotics:  Augmentin  Subjective: History delirium has improved.  Objective: Filed Vitals:   04/20/15 0727 04/20/15 1105 04/20/15 1328 04/20/15 1532  BP:      Pulse:      Temp:      TempSrc:      Resp:      Height:      Weight:      SpO2: 91% 98% 93% 81%    Intake/Output Summary (Last 24 hours) at 04/20/15 1730 Last data filed at 04/20/15 0900  Gross per 24 hour  Intake    600 ml  Output      0 ml  Net    600 ml   Filed Weights   04/16/15 0500 04/17/15 0500 04/18/15 0500  Weight: 89.3 kg (196 lb 13.9 oz) 89.8 kg (197 lb 15.6 oz) 84.1 kg (185 lb 6.5 oz)    Exam:   General:  Alert, awake, oriented 3 at present although still states he has some hallucinations   Cardiovascular: Regular rate and rhythm  Respiratory: No crackles or wheezes, some rhonchi bilaterally  Abdomen: Soft, nontender,  nondistended, positive bowel sounds  Extremities: No clubbing, cyanosis or edema, positive pulses   Neurologic:  Grossly intact and nonfocal  Data Reviewed: Basic Metabolic Panel:  Recent Labs Lab 04/16/15 0436 04/17/15 0540 04/18/15 0400 04/19/15 0626 04/20/15 0743  NA 139 140 143 142 145  K 4.4 4.1 3.7 3.7 3.4*  CL 105 103 101 101 103  CO2 29 30 32 33* 33*  GLUCOSE 148* 150* 181* 253* 163*  BUN 17 9 12 14 19   CREATININE 0.97 0.71 0.70 0.68 0.81  CALCIUM 7.3* 7.4* 7.7* 7.8* 8.1*   Liver Function Tests:  Recent Labs Lab 04/15/15 1612 04/16/15 0436 04/18/15 0400  AST 78* 61* 51*  ALT 72* 53 47  ALKPHOS 74 64 64  BILITOT 0.7 0.6 0.8  PROT 7.6 6.1* 6.7  ALBUMIN 3.6 2.7* 2.6*   No results for input(s): LIPASE, AMYLASE in the last 168 hours. No results for input(s): AMMONIA in the last 168 hours. CBC:  Recent Labs Lab 04/16/15 0436 04/17/15 0540 04/18/15 0400 04/19/15 0626 04/20/15 0743  WBC 13.4* 10.4 8.5 15.8* 13.4*  HGB 9.3* 9.2* 10.2* 9.6* 10.6*  HCT 29.9* 29.2* 32.5* 30.4* 33.1*  MCV 94.9 95.4 93.7 91.8 93.0  PLT 161 159 201 251 262   Cardiac Enzymes: No results for input(s): CKTOTAL, CKMB, CKMBINDEX, TROPONINI in the last 168 hours. BNP (last 3 results) No results for input(s): BNP in the last 8760 hours.  ProBNP (last 3 results)  No results for input(s): PROBNP in the last 8760 hours.  CBG:  Recent Labs Lab 04/19/15 1637 04/19/15 2337 04/20/15 0839 04/20/15 1143 04/20/15 1632  GLUCAP 240* 242* 170* 177* 271*    Recent Results (from the past 240 hour(s))  Culture, blood (Routine X 2) w Reflex to ID Panel     Status: None   Collection Time: 04/15/15  4:12 PM  Result Value Ref Range Status   Specimen Description BLOOD LEFT ANTECUBITAL DRAWN BY RN  Final   Special Requests BOTTLES DRAWN AEROBIC AND ANAEROBIC 4CC EACH  Final   Culture NO GROWTH 5 DAYS  Final   Report Status 04/20/2015 FINAL  Final  Culture, blood (Routine X 2) w Reflex  to ID Panel     Status: None   Collection Time: 04/15/15  4:13 PM  Result Value Ref Range Status   Specimen Description BLOOD RIGHT ANTECUBITAL DRAWN BY RN  Final   Special Requests BOTTLES DRAWN AEROBIC AND ANAEROBIC 4CC EACH  Final   Culture NO GROWTH 5 DAYS  Final   Report Status 04/20/2015 FINAL  Final  MRSA PCR Screening     Status: None   Collection Time: 04/15/15  6:30 PM  Result Value Ref Range Status   MRSA by PCR NEGATIVE NEGATIVE Final    Comment:        The GeneXpert MRSA Assay (FDA approved for NASAL specimens only), is one component of a comprehensive MRSA colonization surveillance program. It is not intended to diagnose MRSA infection nor to guide or monitor treatment for MRSA infections.      Studies: Dg Abd 2 Views  04/20/2015  CLINICAL DATA:  Esophageal cancer, abdominal pain for couple weeks, COPD, ethanol abuse, diabetes mellitus, hepatitis, former smoker EXAM: ABDOMEN - 2 VIEW COMPARISON:  03/06/2015 FINDINGS: Lung bases grossly clear. Nonobstructive bowel gas pattern. No bowel dilatation or bowel wall thickening. Prominent stool in rectum. Few sigmoid diverticula noted. No free intraperitoneal air. Osseous structures unremarkable. No urinary tract calcifications. IMPRESSION: Mildly prominent stool in rectum with note of mild sigmoid diverticulosis. Otherwise normal exam. Electronically Signed   By: Brent Franco M.D.   On: 04/20/2015 14:36    Scheduled Meds: . amoxicillin-clavulanate  1 tablet Oral Q12H  . docusate sodium  100 mg Oral BID  . enoxaparin (LOVENOX) injection  40 mg Subcutaneous Q24H  . FLUoxetine  10 mg Oral Daily  . insulin aspart  0-9 Units Subcutaneous TID WC  . ipratropium-albuterol  3 mL Nebulization Q4H WA  . levothyroxine  25 mcg Oral QAC breakfast  . LORazepam  0.5 mg Intravenous Q6H  . nystatin  5 mL Oral QID  . pneumococcal 23 valent vaccine  0.5 mL Intramuscular Tomorrow-1000  . predniSONE  50 mg Oral Q breakfast   Continuous  Infusions:   Active Problems:   COPD (chronic obstructive pulmonary disease) (HCC)   Sepsis (HCC)   Pneumonia   Healthcare-associated pneumonia   SOB (shortness of breath)    Time spent: 25 minutes. Greater than 50% of this time was spent in direct contact with the patient coordinating care.    [name]  [facility] Pager [phone]  If 7PM-7AM, please contact night-coverage at [email or web address], password [facility] 04/20/2015, 5:30 PM  LOS: 5 days

## 2015-04-20 NOTE — Progress Notes (Signed)
PT Cancellation Note  Patient Details Name: Brent Franco MRN: [id number] DOB: May 30, 1950   Cancelled Treatment:    Reason Eval/Treat Not Completed: Medical issues which prohibited therapy Pt throwing up prior to PT attempt to evaluate. RN suggested PT come back later. Will attempt to see pt this afternoon.   11:02 AM,04/20/2015 Elly Modena PT, [place] [facility] [phone]

## 2015-04-21 DIAGNOSIS — R0602 Shortness of breath: Secondary | ICD-10-CM

## 2015-04-21 LAB — GLUCOSE, CAPILLARY
Glucose-Capillary: 198 mg/dL — ABNORMAL HIGH (ref 65–99)
Glucose-Capillary: 196 mg/dL — ABNORMAL HIGH (ref 65–99)

## 2015-04-21 MED ORDER — HEPARIN SOD (PORK) LOCK FLUSH 100 UNIT/ML IV SOLN
500.0000 [IU] | INTRAVENOUS | Status: AC | PRN
  Administered 2015-04-21: 500 [IU]
  Filled 2015-04-21: qty 5

## 2015-04-21 MED ORDER — IPRATROPIUM-ALBUTEROL 0.5-2.5 (3) MG/3ML IN SOLN: 3.0000 mL | Freq: Three times a day (TID) | RESPIRATORY_TRACT | Status: DC

## 2015-04-21 MED ORDER — AMOXICILLIN-POT CLAVULANATE 875-125 MG PO TABS: 1.0000 | ORAL_TABLET | Freq: Two times a day (BID) | ORAL | Status: DC

## 2015-04-21 MED ORDER — PREDNISONE 10 MG PO TABS: 10.0000 mg | ORAL_TABLET | Freq: Every day | ORAL | Status: DC

## 2015-04-21 NOTE — Discharge Summary (Signed)
Physician Discharge Summary  Brent Franco OPF:[id number] DOB: 03-22-1950 DOA: 04/15/2015  PCP: [name], NP  Admit date: 04/15/2015 Discharge date: 04/21/2015  Time spent: 45 minutes  Recommendations for Outpatient Follow-up:  -We'll be discharged home today. -Advised to follow-up with primary care provider in 2 weeks. -Recommend repeat chest x-ray in 4-6 weeks to ensure complete resolution of pneumonia.   Discharge Diagnoses:  Active Problems:   COPD (chronic obstructive pulmonary disease) (HCC)   Sepsis (HCC)   Pneumonia   Healthcare-associated pneumonia   SOB (shortness of breath)   Discharge Condition: Stable and improved  Filed Weights   04/16/15 0500 04/17/15 0500 04/18/15 0500  Weight: 89.3 kg (196 lb 13.9 oz) 89.8 kg (197 lb 15.6 oz) 84.1 kg (185 lb 6.5 oz)    History of present illness:  As per Dr. Darrick Meigs on 62/69: 65 year old male who  has a past medical history of COPD (chronic obstructive pulmonary disease) ([place]); ETOH abuse; Depression; Arthritis; Asthma; Viral hepatitis C; Diabetes mellitus without complication ([place]); Hypothyroidism; Cancer ([place]); Pneumonia (02/2015); and Itching. Today presents to the hospital for increasing confusion, cough. As per patient's wife he has been sick over the past few days and has been coughing clear phlegm. Patient was recently diagnosed with esophageal cancer, and is supposed to undergo MRI of the abdomen as well as endoscopic ultrasound to further stage the cancer. The biopsy confirmed esophageal cancer. Patient has been running fever as high as 102 at home. Also complains of chills. Coughing up phlegm. No chest pain. Wife called cancer Center and was told to come to the ED for further evaluation In the ED patient was found to be hypoxic and confused. Chest x-ray showed pneumonia. Patient started on vancomycin and Zosyn. Currently requiring oxygen via Ventimask. Lab work in the ED revealed creatinine 1.63, lactic acid 3.03, white  count 18.2  Hospital Course:   Sepsis -Secondary to pneumonia. -Sepsis parameters have normalized, blood cultures remain negative to date.  Healthcare associated pneumonia -So far culture data remains negative. -Strep pneumo and Legionella urine antigen negative, blood cultures are negative. -We will transition to oral antibiotics today, will place on Augmentin for 5 more days.  Acute renal failure -Resolved with IV fluids.  Delirium -Significant sundowning overnight. Is still a little confused today although improved. -We'll minimize psychotropic medications  Diabetes mellitus -Follow CBGs and adjust insulin as required.  Recent diagnosis of esophageal cancer -Continue outpatient follow-up scheduled.  Acute urinary retention -Foley was removed 2/8. -No further difficulty.   Procedures:  None   Consultations:  None  Discharge Instructions  Discharge Instructions    Diet - low sodium heart healthy    Complete by:  As directed      Increase activity slowly    Complete by:  As directed             Medication List    STOP taking these medications        CARBOPLATIN IV     TAXOL IV      TAKE these medications        acetaminophen 500 MG tablet  Commonly known as:  TYLENOL  Take 500 mg by mouth every 6 (six) hours as needed for fever.     albuterol 108 (90 Base) MCG/ACT inhaler  Commonly known as:  PROVENTIL HFA;VENTOLIN HFA  Inhale 1 puff into the lungs every 6 (six) hours as needed for wheezing or shortness of breath.     ALPRAZolam 1 MG  tablet  Commonly known as:  XANAX  Take 1 tablet (1 mg total) by mouth every 8 (eight) hours as needed for anxiety.     amoxicillin-clavulanate 875-125 MG tablet  Commonly known as:  AUGMENTIN  Take 1 tablet by mouth every 12 (twelve) hours.     docusate sodium 100 MG capsule  Commonly known as:  COLACE  Take 100 mg by mouth 2 (two) times daily.     FLUoxetine 10 MG capsule  Commonly known as:  PROZAC    Take 10 mg by mouth daily.     HUMULIN R 100 units/mL injection  Generic drug:  insulin regular  Inject 2-12 Units into the skin 2 (two) times daily as needed for high blood sugar. 151-200 = 2 units, 201-250 = 4 units, 251-300 = 6 units, 301-350 = 8 units, 351-400 = 10 units, 401-450 = 12 units.     ipratropium-albuterol 0.5-2.5 (3) MG/3ML Soln  Commonly known as:  DUONEB  INHALE CONTENTS OF 1 VIAL IN NEBULIZER EVERY 4 HOURS AS NEEDED FOR SHORTNESS OF BREATH.     ketoconazole 2 % cream  Commonly known as:  NIZORAL  Apply 1 application topically 2 (two) times daily.     levothyroxine 25 MCG tablet  Commonly known as:  SYNTHROID, LEVOTHROID  Take 25 mcg by mouth daily.     lidocaine-prilocaine cream  Commonly known as:  EMLA  Apply a quarter size amount to port site 1 hour prior to chemo. Do not rub in. Cover with plastic wrap.     metFORMIN 500 MG tablet  Commonly known as:  GLUCOPHAGE  Take 500 mg by mouth 2 (two) times daily.     mometasone 50 MCG/ACT nasal spray  Commonly known as:  NASONEX  Place 2 sprays into the nose daily as needed (congestion).     nitroGLYCERIN 0.4 MG SL tablet  Commonly known as:  NITROSTAT  Place 1 tablet (0.4 mg total) under the tongue every 5 (five) minutes as needed for chest pain.     NYQUIL MULTI-SYMPTOM PO  Take 1 tablet by mouth daily as needed (congestion/cold).     ondansetron 8 MG tablet  Commonly known as:  ZOFRAN  Take 1 tablet (8 mg total) by mouth every 8 (eight) hours as needed for nausea or vomiting.     oxyCODONE 20 mg 12 hr tablet  Commonly known as:  OXYCONTIN  Take 1 tablet (20 mg total) by mouth every 8 (eight) hours.     oxyCODONE-acetaminophen 5-325 MG tablet  Commonly known as:  ROXICET  Take 1-2 tablets by mouth every 6 (six) hours as needed for moderate pain or severe pain.     predniSONE 10 MG tablet  Commonly known as:  DELTASONE  Take 1 tablet (10 mg total) by mouth daily with breakfast. Take 6 tablets today  and then decrease by 1 tablet daily until none are left.     prochlorperazine 10 MG tablet  Commonly known as:  COMPAZINE  Take 1 tablet (10 mg total) by mouth every 6 (six) hours as needed for nausea or vomiting.     SPIRIVA HANDIHALER 18 MCG inhalation capsule  Generic drug:  tiotropium  Place 1 puff into inhaler and inhale daily.       Allergies  Allergen Reactions  . Demerol [Meperidine] Rash       Follow-up Information    Follow up with [name], NP. Schedule an appointment as soon as possible for a visit in  2 weeks.   Specialty:  Nurse Practitioner   Contact information:   [address] [address] [phone]        The results of significant diagnostics from this hospitalization (including imaging, microbiology, ancillary and laboratory) are listed below for reference.    Significant Diagnostic Studies: Dg Chest 2 View  03/23/2015  CLINICAL DATA:  Recent admission for pneumonia. Known sarcoidosis. Preop for esophageal dilatation and colonoscopy. Altered mental status. EXAM: CHEST  2 VIEW COMPARISON:  03/06/2015 as well as chest CT 03/08/2015. FINDINGS: Lungs are adequately inflated and demonstrate interval improvement patient's left-sided effusion with small residual effusion. Persistent opacification over the lateral left mid to upper lung with mild improvement. Cardiomediastinal silhouette and remainder of the exam is unchanged. IMPRESSION: Improving left effusion and slight interval improvement of opacification over the lateral left mid to upper lung. Recommend continued follow-up to resolution. Electronically Signed   By: Marin Olp M.D.   On: 03/23/2015 14:22   Nm Pet Image Initial (pi) Skull Base To Thigh  04/05/2015  CLINICAL DATA:  Initial treatment strategy for esophageal cancer. EXAM: NUCLEAR MEDICINE PET SKULL BASE TO THIGH TECHNIQUE: 12.54 mCi F-18 FDG was injected intravenously. Full-ring PET imaging was performed from the skull base to thigh  after the radiotracer. CT data was obtained and used for attenuation correction and anatomic localization. FASTING BLOOD GLUCOSE:  Value: 93 mg/dl COMPARISON:  CT scans 03/01/2015 and 03/08/2015 FINDINGS: NECK No hypermetabolic lymph nodes in the neck. CHEST Elongated esophageal mass involving the distal esophagus is markedly hypermetabolic with SUV max of 47.42. The small mediastinal lymph nodes are not clearly hypermetabolic but still somewhat worrisome. 10 mm gastrohepatic ligament lymph node is hypermetabolic with SUV max of 8.3. Improved left-sided pleural effusion. Wedge-shaped area of atelectasis or infiltrate is noted with SUV max of 5.44. The small residual pleural effusion is also mildly hypermetabolic with SUV max of 3.6. This could be infectious. ABDOMEN/PELVIS Hypermetabolic lesions suspected in the right hepatic lobe posteriorly. This has an SUV max of 5.0. I do not see an obvious lesion on the CT scan but this is suspicious for metastasis. MRI abdomen without and with contrast may be helpful for further evaluation. Borderline enlarged celiac axis and periportal lymph nodes are not hypermetabolic. No retroperitoneal adenopathy. No significant findings in the pelvis. No inguinal adenopathy. SKELETON No hypermetabolic or worrisome bone lesions. IMPRESSION: 1. Elongated distal esophageal mass is markedly hypermetabolic and consistent with esophageal cancer. Small mediastinal lymph nodes are not hypermetabolic. There is a single 10 mm gastrohepatic ligament lymph node which is hypermetabolic. 2. Suspect a right hepatic lobe metastatic lesion. MRI abdomen without with contrast may be helpful for further evaluation. 3. Persistent but improved left lower lobe airspace process and left pleural effusion. These areas are hypermetabolic but likely due to infection. Electronically Signed   By: Marijo Sanes M.D.   On: 04/05/2015 11:32   Ir Fluoro Guide Cv Line Right  04/03/2015  INDICATION: Esophageal cancer.  In need of durable intravenous access for chemotherapy administration. EXAM: IMPLANTED PORT A CATH PLACEMENT WITH ULTRASOUND AND FLUOROSCOPIC GUIDANCE COMPARISON:  None. MEDICATIONS: Ancef 2 g IV; The antibiotic was administered within an appropriate time interval prior to skin puncture. ANESTHESIA/SEDATION: Versed 2 mg IV; Fentanyl 50 mcg IV; Total Moderate Sedation Time 23 minutes. The patient's level of consciousness and vital signs were monitored continuously by radiology nursing throughout the procedure under my direct supervision. CONTRAST:  None FLUOROSCOPY TIME:  18 seconds (3.2 MGy)  COMPLICATIONS: None immediate. PROCEDURE: The procedure, risks, benefits, and alternatives were explained to the patient. Questions regarding the procedure were encouraged and answered. The patient understands and consents to the procedure. The right neck and chest were prepped with chlorhexidine in a sterile fashion, and a sterile drape was applied covering the operative field. Maximum barrier sterile technique with sterile gowns and gloves were used for the procedure. A timeout was performed prior to the initiation of the procedure. Local anesthesia was provided with 1% lidocaine with epinephrine. After creating a small venotomy incision, a micropuncture kit was utilized to access the internal jugular vein under direct, real-time ultrasound guidance. Ultrasound image documentation was performed. The microwire was kinked to measure appropriate catheter length. A subcutaneous port pocket was then created along the upper chest wall utilizing a combination of sharp and blunt dissection. The pocket was irrigated with sterile saline. A single lumen thin power injectable port was chosen for placement. The 8 Fr catheter was tunneled from the port pocket site to the venotomy incision. The port was placed in the pocket. The external catheter was trimmed to appropriate length. At the venotomy, an 8 Fr peel-away sheath was placed over a  guidewire under fluoroscopic guidance. The catheter was then placed through the sheath and the sheath was removed. Final catheter positioning was confirmed and documented with a fluoroscopic spot radiograph. The port was accessed with a Huber needle, aspirated and flushed with heparinized saline. The venotomy site was closed with an interrupted 4-0 Vicryl suture. The port pocket incision was closed with interrupted 2-0 Vicryl suture and the skin was opposed with a running subcuticular 4-0 Vicryl suture. Dermabond and Steri-strips were applied to both incisions. Dressings were placed. The patient tolerated the procedure well without immediate post procedural complication. FINDINGS: After catheter placement, the tip lies within the superior cavoatrial junction. The catheter aspirates and flushes normally and is ready for immediate use. IMPRESSION: Successful placement of a right internal jugular approach power injectable Port-A-Cath. The catheter is ready for immediate use. Electronically Signed   By: Sandi Mariscal M.D.   On: 04/03/2015 15:45   Ir US Guide Vasc Access Right  04/03/2015  INDICATION: Esophageal cancer. In need of durable intravenous access for chemotherapy administration. EXAM: IMPLANTED PORT A CATH PLACEMENT WITH ULTRASOUND AND FLUOROSCOPIC GUIDANCE COMPARISON:  None. MEDICATIONS: Ancef 2 g IV; The antibiotic was administered within an appropriate time interval prior to skin puncture. ANESTHESIA/SEDATION: Versed 2 mg IV; Fentanyl 50 mcg IV; Total Moderate Sedation Time 23 minutes. The patient's level of consciousness and vital signs were monitored continuously by radiology nursing throughout the procedure under my direct supervision. CONTRAST:  None FLUOROSCOPY TIME:  18 seconds (3.2 MGy) COMPLICATIONS: None immediate. PROCEDURE: The procedure, risks, benefits, and alternatives were explained to the patient. Questions regarding the procedure were encouraged and answered. The patient understands and  consents to the procedure. The right neck and chest were prepped with chlorhexidine in a sterile fashion, and a sterile drape was applied covering the operative field. Maximum barrier sterile technique with sterile gowns and gloves were used for the procedure. A timeout was performed prior to the initiation of the procedure. Local anesthesia was provided with 1% lidocaine with epinephrine. After creating a small venotomy incision, a micropuncture kit was utilized to access the internal jugular vein under direct, real-time ultrasound guidance. Ultrasound image documentation was performed. The microwire was kinked to measure appropriate catheter length. A subcutaneous port pocket was then created along the upper chest wall utilizing  a combination of sharp and blunt dissection. The pocket was irrigated with sterile saline. A single lumen thin power injectable port was chosen for placement. The 8 Fr catheter was tunneled from the port pocket site to the venotomy incision. The port was placed in the pocket. The external catheter was trimmed to appropriate length. At the venotomy, an 8 Fr peel-away sheath was placed over a guidewire under fluoroscopic guidance. The catheter was then placed through the sheath and the sheath was removed. Final catheter positioning was confirmed and documented with a fluoroscopic spot radiograph. The port was accessed with a Huber needle, aspirated and flushed with heparinized saline. The venotomy site was closed with an interrupted 4-0 Vicryl suture. The port pocket incision was closed with interrupted 2-0 Vicryl suture and the skin was opposed with a running subcuticular 4-0 Vicryl suture. Dermabond and Steri-strips were applied to both incisions. Dressings were placed. The patient tolerated the procedure well without immediate post procedural complication. FINDINGS: After catheter placement, the tip lies within the superior cavoatrial junction. The catheter aspirates and flushes normally  and is ready for immediate use. IMPRESSION: Successful placement of a right internal jugular approach power injectable Port-A-Cath. The catheter is ready for immediate use. Electronically Signed   By: Sandi Mariscal M.D.   On: 04/03/2015 15:45   Dg Chest Port 1 View  04/17/2015  CLINICAL DATA:  Fever and confusion with shortness of Breath EXAM: PORTABLE CHEST 1 VIEW COMPARISON:  04/15/2015 FINDINGS: Cardiac shadow is stable. Right chest wall port is again seen and stable. Name patchy changes are noted bilaterally predominately in a perihilar distribution suggestive of CHF. This may however represent multi focus pneumonias. Continued followup is recommended. IMPRESSION: New multifocal patchy infiltrates bilaterally as described. Electronically Signed   By: Inez Catalina M.D.   On: 04/17/2015 16:17   Dg Chest Portable 1 View  04/15/2015  CLINICAL DATA:  Cough and fever.  Esophageal cancer EXAM: PORTABLE CHEST 1 VIEW COMPARISON:  03/23/2015 FINDINGS: Progression of left lower lobe infiltrate may represent recurrent pneumonia. No significant effusion. Right lung clear. Port-A-Cath tip in the SVC. Negative for heart failure. IMPRESSION: Progression of left lower lobe infiltrate since prior study which may represent recurrent pneumonia. Electronically Signed   By: Franchot Gallo M.D.   On: 04/15/2015 16:32   Dg Abd 2 Views  04/20/2015  CLINICAL DATA:  Esophageal cancer, abdominal pain for couple weeks, COPD, ethanol abuse, diabetes mellitus, hepatitis, former smoker EXAM: ABDOMEN - 2 VIEW COMPARISON:  03/06/2015 FINDINGS: Lung bases grossly clear. Nonobstructive bowel gas pattern. No bowel dilatation or bowel wall thickening. Prominent stool in rectum. Few sigmoid diverticula noted. No free intraperitoneal air. Osseous structures unremarkable. No urinary tract calcifications. IMPRESSION: Mildly prominent stool in rectum with note of mild sigmoid diverticulosis. Otherwise normal exam. Electronically Signed   By: Lavonia Dana M.D.   On: 04/20/2015 14:36    Microbiology: Recent Results (from the past 240 hour(s))  Culture, blood (Routine X 2) w Reflex to ID Panel     Status: None   Collection Time: 04/15/15  4:12 PM  Result Value Ref Range Status   Specimen Description BLOOD LEFT ANTECUBITAL DRAWN BY RN  Final   Special Requests BOTTLES DRAWN AEROBIC AND ANAEROBIC 4CC EACH  Final   Culture NO GROWTH 5 DAYS  Final   Report Status 04/20/2015 FINAL  Final  Culture, blood (Routine X 2) w Reflex to ID Panel     Status: None   Collection Time:  04/15/15  4:13 PM  Result Value Ref Range Status   Specimen Description BLOOD RIGHT ANTECUBITAL DRAWN BY RN  Final   Special Requests BOTTLES DRAWN AEROBIC AND ANAEROBIC 4CC EACH  Final   Culture NO GROWTH 5 DAYS  Final   Report Status 04/20/2015 FINAL  Final  MRSA PCR Screening     Status: None   Collection Time: 04/15/15  6:30 PM  Result Value Ref Range Status   MRSA by PCR NEGATIVE NEGATIVE Final    Comment:        The GeneXpert MRSA Assay (FDA approved for NASAL specimens only), is one component of a comprehensive MRSA colonization surveillance program. It is not intended to diagnose MRSA infection nor to guide or monitor treatment for MRSA infections.      Labs: Basic Metabolic Panel:  Recent Labs Lab 04/16/15 0436 04/17/15 0540 04/18/15 0400 04/19/15 0626 04/20/15 0743  NA 139 140 143 142 145  K 4.4 4.1 3.7 3.7 3.4*  CL 105 103 101 101 103  CO2 29 30 32 33* 33*  GLUCOSE 148* 150* 181* 253* 163*  BUN _0 CREATININE 0.97 0.71 0.70 0.68 0.81  CALCIUM 7.3* 7.4* 7.7* 7.8* 8.1*   Liver Function Tests:  Recent Labs Lab 04/15/15 1612 04/16/15 0436 04/18/15 0400  AST 78* 61* 51*  ALT 72* 53 47  ALKPHOS 74 64 64  BILITOT 0.7 0.6 0.8  PROT 7.6 6.1* 6.7  ALBUMIN 3.6 2.7* 2.6*   No results for input(s): LIPASE, AMYLASE in the last 168 hours. No results for input(s): AMMONIA in the last 168 hours. CBC:  Recent Labs Lab  04/16/15 0436 04/17/15 0540 04/18/15 0400 04/19/15 0626 04/20/15 0743  WBC 13.4* 10.4 8.5 15.8* 13.4*  HGB 9.3* 9.2* 10.2* 9.6* 10.6*  HCT 29.9* 29.2* 32.5* 30.4* 33.1*  MCV 94.9 95.4 93.7 91.8 93.0  PLT 161 159 201 251 262   Cardiac Enzymes: No results for input(s): CKTOTAL, CKMB, CKMBINDEX, TROPONINI in the last 168 hours. BNP: BNP (last 3 results) No results for input(s): BNP in the last 8760 hours.  ProBNP (last 3 results) No results for input(s): PROBNP in the last 8760 hours.  CBG:  Recent Labs Lab 04/20/15 1143 04/20/15 1632 04/20/15 2216 04/21/15 0800 04/21/15 1129  GLUCAP 177* 271* 161* 198* 196*       Signed:  HERNANDEZ ACOSTA,ESTELA  [facility] Pager: [phone] 04/21/2015, 3:51 PM

## 2015-04-21 NOTE — Progress Notes (Signed)
Patient discharged home.  Port flushed with heparin and deaccessed.  Reviewed DC instructions and meds with patient and wife.  Emphasized importance of completing doses of abx and steroids.  Verbalizes understanding.  Instructed to follow up with PCP.  No questions at this time, stable to DC home.  Assisted off unit via WC

## 2015-04-23 ENCOUNTER — Other Ambulatory Visit (HOSPITAL_COMMUNITY): Payer: Self-pay | Admitting: *Deleted

## 2015-04-23 ENCOUNTER — Ambulatory Visit (HOSPITAL_COMMUNITY): Payer: Medicaid Other | Admitting: Hematology & Oncology

## 2015-04-23 ENCOUNTER — Inpatient Hospital Stay (HOSPITAL_COMMUNITY): Payer: Medicaid Other

## 2015-04-23 ENCOUNTER — Telehealth: Payer: Self-pay | Admitting: Internal Medicine

## 2015-04-23 DIAGNOSIS — K769 Liver disease, unspecified: Secondary | ICD-10-CM

## 2015-04-23 DIAGNOSIS — R7401 Elevation of levels of liver transaminase levels: Secondary | ICD-10-CM

## 2015-04-23 DIAGNOSIS — C155 Malignant neoplasm of lower third of esophagus: Secondary | ICD-10-CM

## 2015-04-23 DIAGNOSIS — K2289 Other specified disease of esophagus: Secondary | ICD-10-CM

## 2015-04-23 DIAGNOSIS — R740 Nonspecific elevation of levels of transaminase and lactic acid dehydrogenase [LDH]: Secondary | ICD-10-CM

## 2015-04-23 DIAGNOSIS — K228 Other specified diseases of esophagus: Secondary | ICD-10-CM

## 2015-04-23 MED ORDER — OXYCODONE-ACETAMINOPHEN 5-325 MG PO TABS: 1.0000 | ORAL_TABLET | Freq: Four times a day (QID) | ORAL | Status: DC | PRN

## 2015-04-23 NOTE — Telephone Encounter (Signed)
Pt's wife called to let us know that the patient is starting chemo on Monday 04/30/15 and will need to cancel his OV here with [place]. She wanted to know if it was necessary to have him rescheduled right now or should he wait a few months. Please advise. [phone]

## 2015-04-24 NOTE — Telephone Encounter (Signed)
Routing to EG. When do you want this pt to come back for an ov?

## 2015-04-26 ENCOUNTER — Inpatient Hospital Stay (HOSPITAL_COMMUNITY): Payer: Medicaid Other

## 2015-04-26 ENCOUNTER — Encounter (HOSPITAL_COMMUNITY): Payer: Self-pay | Admitting: Hematology & Oncology

## 2015-04-26 ENCOUNTER — Encounter (HOSPITAL_BASED_OUTPATIENT_CLINIC_OR_DEPARTMENT_OTHER): Payer: Medicaid Other | Admitting: Hematology & Oncology

## 2015-04-26 VITALS — BP 129/76 | HR 73 | Temp 97.5°F | Resp 18 | Wt 179.0 lb

## 2015-04-26 DIAGNOSIS — G4700 Insomnia, unspecified: Secondary | ICD-10-CM

## 2015-04-26 DIAGNOSIS — F419 Anxiety disorder, unspecified: Secondary | ICD-10-CM | POA: Diagnosis not present

## 2015-04-26 DIAGNOSIS — C155 Malignant neoplasm of lower third of esophagus: Secondary | ICD-10-CM

## 2015-04-26 DIAGNOSIS — F329 Major depressive disorder, single episode, unspecified: Secondary | ICD-10-CM | POA: Diagnosis not present

## 2015-04-26 DIAGNOSIS — K228 Other specified diseases of esophagus: Secondary | ICD-10-CM

## 2015-04-26 DIAGNOSIS — K2289 Other specified disease of esophagus: Secondary | ICD-10-CM

## 2015-04-26 DIAGNOSIS — K769 Liver disease, unspecified: Secondary | ICD-10-CM

## 2015-04-26 MED ORDER — FLUOXETINE HCL 20 MG PO CAPS: 20.0000 mg | ORAL_CAPSULE | Freq: Every day | ORAL | Status: DC

## 2015-04-26 NOTE — Telephone Encounter (Signed)
Please nic.  

## 2015-04-26 NOTE — Progress Notes (Signed)
[place] [facility]  Patient Care Team: [name], NP as PCP - General (Nurse Practitioner) Daneil Dolin, MD as Consulting Physician (Gastroenterology)  CHIEF COMPLAINTS:  EGD 03/26/2015 with bulky esophageal tumor involving the distal esophagus encroaching significantly upon the lumen. Lesion begins at 35cm from the incisors and extends down to 39 cm approximately 1 cm above the GE junction. No varices were noted, no portal gastropathy noted.  Pathology with invasive poorly differentiated carcinoma, extensive ulceration with fungal organisms (c/w candida)    Esophageal cancer ([place])   03/08/2015 Imaging CT chest at [facility]- abnormal adenopathy (hilar and mediastinal)   03/26/2015 Procedure EGD by Dr. Gala Romney with abnormal findings and biopsy   03/26/2015 Pathology Results Esophagus, biopsy - INVASIVE POORLY DIFFERENTIATED CARCINOMA, SEE COMMENT. - EXTENSIVE ULCERATION WITH FUNGAL ORGANISMS.   04/05/2015 PET scan Elongated distal esophageal mass is markedly hypermetabolic. Small mediastinal lymph nodes are not hypermetabolic. Single 10 mm gastrohepatic ligament lymph node which is hypermetabolic. Suspect a right hepatic lobe metastatic lesion.   04/15/2015 - 04/21/2015 Hospital Admission [facility]                   HISTORY OF PRESENTING ILLNESS:  Brent Franco 65 y.o. male is here for follow-up of esophageal carcinoma.   Brent Franco is accompanied by his wife, Brent Franco, today. The patient began radiation treatment this week and will begin chemotherapy on Monday next week. Chemotherapy was delayed this week secondary to a recent hospitalization for pneumonia.  He has not been sleeping well, which is attributed to anxiety and depression related to his diagnosis. Reports that the other day he wanted to sit outside when it was warm, however his wife was worried that doing so would make him sick.  Admits that he uses as many xanax as possible, due to his inability to  sleep. He is not able to sleep more than one to two hours at a time. We discussed how normalizing his routine will be good for his current mood.  Patient states, "I don't want to stay in no hospital again".   His wife reports that his blood sugar has been out of control lately. They have been checking his blood sugar 3 to 4 times daily. He was diagnosed with diabetes when he was in the hospital with pneumonia in December. They are still learning how to manage his diabetes.   Reports that his pain is better managed. His wife has also noticed an improvement, stating that he is acting better.   He continues to take his antiemetics regularly. He has been experiencing nausea and vomiting after radiation treatment.  He is here today for ongoing management and follow-up.    MEDICAL HISTORY:  Past Medical History  Diagnosis Date  . COPD (chronic obstructive pulmonary disease) ([place])   . ETOH abuse     stopped 10 years ago  . Depression     suicide attempt 20 years ago  . Arthritis   . Asthma   . Viral hepatitis C   . Diabetes mellitus without complication ([place])   . Hypothyroidism   . Cancer ([place])     esophageal  . Pneumonia 02/2015  . Itching     both feet, known has athlet's feet now    SURGICAL HISTORY: Past Surgical History  Procedure Laterality Date  . Other surgical history Left 1995    fatty tissue tumor right upper thigh  . Dental surgery N/A January 2016    "  dentures and spurs"   . Tumor excision Left 1990    inner thigh  . Colonoscopy with propofol N/A 03/26/2015    Procedure: COLONOSCOPY WITH PROPOFOL;  Surgeon: Daneil Dolin, MD;  [facility];  Service: Endoscopy;  Laterality: N/A;  1100  . Esophagogastroduodenoscopy (egd) with propofol N/A 03/26/2015    Procedure: ESOPHAGOGASTRODUODENOSCOPY (EGD) WITH PROPOFOL;  Surgeon: Daneil Dolin, MD;  [facility];  Service: Endoscopy;  Laterality: N/A;  . Biopsy  03/26/2015    Procedure: BIOPSY;  Surgeon:  Daneil Dolin, MD;  [facility];  Service: Endoscopy;;  esophageal mass  . Polypectomy  03/26/2015    Procedure: POLYPECTOMY;  Surgeon: Daneil Dolin, MD;  [facility];  Service: Endoscopy;;  polypectomy at ileocecal valve    SOCIAL HISTORY: Social History   Social History  . Marital Status: Married    Spouse Name: N/A  . Number of Children: N/A  . Years of Education: N/A   Occupational History  . Not on file.   Social History Main Topics  . Smoking status: Former Smoker -- 1.50 packs/day for 47 years    Types: Cigarettes    Start date: 12/03/1966    Quit date: 12/08/2013  . Smokeless tobacco: Never Used  . Alcohol Use: No     Comment: Quit 2006: previously drank heavily. Had 1 drink 2-3 years ago (2013-2014)  . Drug Use: No     Comment: Previous IV drug use as teenager, none in 40 years. Last marijuanna use 02/22/15.  Marland Kitchen Sexual Activity: Yes   Other Topics Concern  . Not on file   Social History Narrative   Married 33 years, 2 children, 55 and 44. No grandchildren. He's quit smoking for about almost 16 months. History of IVDA Has had problems in the past with alcohol, but quit many years ago. Occupationally he says he's done just about everything  Concrete work, cooking, Warehouse manager, roofing, cleaned pools; "a lot of everything." From [place], [place]  FAMILY HISTORY: Family History  Problem Relation Age of Onset  . Pancreatic cancer Mother   . Lung cancer Father   . Colon cancer Neg Hx    indicated that his mother is deceased. He indicated that his father is deceased.    Mother and father deceased Mother was around 37, father around 19 Father died of lung cancer; his father was a smoker Mother died of pancreatic cancer 1 brother dead from being shot in the head Older brother hasn't been seen or heard from in 10-12 years  ALLERGIES:  is allergic to demerol.  MEDICATIONS:  Current Outpatient Prescriptions  Medication Sig Dispense  Refill  . acetaminophen (TYLENOL) 500 MG tablet Take 500 mg by mouth every 6 (six) hours as needed for fever.    Marland Kitchen albuterol (PROVENTIL HFA;VENTOLIN HFA) 108 (90 BASE) MCG/ACT inhaler Inhale 1 puff into the lungs every 6 (six) hours as needed for wheezing or shortness of breath.    . ALPRAZolam (XANAX) 1 MG tablet Take 1 tablet (1 mg total) by mouth every 8 (eight) hours as needed for anxiety. 90 tablet 1  . docusate sodium (COLACE) 100 MG capsule Take 100 mg by mouth 2 (two) times daily.  4  . FLUoxetine (PROZAC) 10 MG capsule Take 10 mg by mouth daily.  2  . HUMULIN R 100 UNIT/ML injection Inject 2-12 Units into the skin 2 (two) times daily as needed for high blood sugar. 151-200 = 2  units, 201-250 = 4 units, 251-300 = 6 units, 301-350 = 8 units, 351-400 = 10 units, 401-450 = 12 units.  2  . ipratropium-albuterol (DUONEB) 0.5-2.5 (3) MG/3ML SOLN INHALE CONTENTS OF 1 VIAL IN NEBULIZER EVERY 4 HOURS AS NEEDED FOR SHORTNESS OF BREATH.  3  . ketoconazole (NIZORAL) 2 % cream Apply 1 application topically 2 (two) times daily.  6  . levothyroxine (SYNTHROID, LEVOTHROID) 25 MCG tablet Take 25 mcg by mouth daily.  5  . lidocaine-prilocaine (EMLA) cream Apply a quarter size amount to port site 1 hour prior to chemo. Do not rub in. Cover with plastic wrap. 30 g 3  . metFORMIN (GLUCOPHAGE) 500 MG tablet Take 500 mg by mouth 2 (two) times daily.  3  . mometasone (NASONEX) 50 MCG/ACT nasal spray Place 2 sprays into the nose daily as needed (congestion).     . nitroGLYCERIN (NITROSTAT) 0.4 MG SL tablet Place 1 tablet (0.4 mg total) under the tongue every 5 (five) minutes as needed for chest pain. 25 tablet 3  . ondansetron (ZOFRAN) 8 MG tablet Take 1 tablet (8 mg total) by mouth every 8 (eight) hours as needed for nausea or vomiting. 30 tablet 2  . oxyCODONE (OXYCONTIN) 20 mg 12 hr tablet Take 1 tablet (20 mg total) by mouth every 8 (eight) hours. 90 tablet 0  . oxyCODONE-acetaminophen (ROXICET) 5-325 MG tablet  Take 1-2 tablets by mouth every 6 (six) hours as needed for moderate pain or severe pain. 60 tablet 0  . predniSONE (DELTASONE) 10 MG tablet Take 1 tablet (10 mg total) by mouth daily with breakfast. Take 6 tablets today and then decrease by 1 tablet daily until none are left. 21 tablet 0  . prochlorperazine (COMPAZINE) 10 MG tablet Take 1 tablet (10 mg total) by mouth every 6 (six) hours as needed for nausea or vomiting. 30 tablet 2  . Pseudoeph-Doxylamine-DM-APAP (NYQUIL MULTI-SYMPTOM PO) Take 1 tablet by mouth daily as needed (congestion/cold).     . SPIRIVA HANDIHALER 18 MCG inhalation capsule Place 1 puff into inhaler and inhale daily.  5   No current facility-administered medications for this visit.    Review of Systems  Constitutional: Positive for malaise/fatigue. Negative for fever and chills.  HENT: Negative for congestion, hearing loss, nosebleeds and tinnitus.   Eyes: Negative.  Negative for blurred vision, double vision, pain and discharge.  Respiratory: Negative.  Negative for cough, hemoptysis, sputum production, shortness of breath and wheezing.   Cardiovascular: Negative for palpitations, claudication, leg swelling and PND.  Gastrointestinal: Positive for nausea and vomiting. Negative for heartburn, diarrhea, constipation, blood in stool and melena.       Nausea and vomiting after radiation treatment.  Genitourinary: Negative.  Negative for dysuria, urgency, frequency and hematuria.  Musculoskeletal: Positive for joint pain. Negative for myalgias and falls.  Skin: Negative.  Negative for itching and rash.  Neurological: Negative.  Negative for dizziness, tingling, tremors, sensory change, speech change, focal weakness, seizures, loss of consciousness, weakness and headaches.  Endo/Heme/Allergies: Negative.  Does not bruise/bleed easily.  Psychiatric/Behavioral: Positive for depression. Negative for suicidal ideas, memory loss and substance abuse. The patient is nervous/anxious  and has insomnia.        Only able to sleep one to two hours at a time.  All other systems reviewed and are negative.  14 point ROS was done and is otherwise as detailed above or in HPI   PHYSICAL EXAMINATION: ECOG PERFORMANCE STATUS: 1 - Symptomatic but completely ambulatory  Filed Vitals:   04/26/15 0842  BP: 129/76  Pulse: 73  Temp: 97.5 F (36.4 C)  Resp: 18   Filed Weights   04/26/15 0842  Weight: 179 lb (81.194 kg)    Physical Exam  Constitutional: He is oriented to person, place, and time and well-developed, well-nourished, and in no distress.  Thin. Wears glasses  HENT:  Head: Normocephalic and atraumatic.  Nose: Nose normal.  Mouth/Throat: Oropharynx is clear and moist. He has dentures. No oropharyngeal exudate.  Dentures on top and bottom.  Eyes: Conjunctivae and EOM are normal. Pupils are equal, round, and reactive to light. Right eye exhibits no discharge. Left eye exhibits no discharge. No scleral icterus.  Neck: Normal range of motion. Neck supple. No tracheal deviation present. No thyromegaly present.  Cardiovascular: Normal rate, regular rhythm and normal heart sounds.  Exam reveals no gallop and no friction rub.   No murmur heard. Pulmonary/Chest: Effort normal and breath sounds normal. He has no wheezes. He has no rales.  Abdominal: Soft. Bowel sounds are normal. He exhibits no distension and no mass. There is no tenderness. There is no rebound and no guarding.  Musculoskeletal: Normal range of motion. He exhibits no edema.  Lymphadenopathy:    He has no cervical adenopathy.  Neurological: He is alert and oriented to person, place, and time. He has normal reflexes. No cranial nerve deficit. Gait normal. Coordination normal.  Skin: Skin is warm and dry. No rash noted.  Psychiatric: Mood, memory, affect and judgment normal.  Nursing note and vitals reviewed.   LABORATORY DATA:  I have reviewed the data as listedResults for HINTON, LUELLEN (MRN [id number])  as of 04/26/2015 09:03  Ref. Range 04/18/2015 04:00 04/19/2015 06:26 04/20/2015 07:43  Sodium Latest Ref Range: 135-145 mmol/L 143 142 145  Potassium Latest Ref Range: 3.5-5.1 mmol/L 3.7 3.7 3.4 (L)  Chloride Latest Ref Range: 101-111 mmol/L 101 101 103  CO2 Latest Ref Range: 22-32 mmol/L 32 33 (H) 33 (H)  BUN Latest Ref Range: 6-20 mg/dL _0 Creatinine Latest Ref Range: 0.61-1.24 mg/dL 0.70 0.68 0.81  Calcium Latest Ref Range: 8.9-10.3 mg/dL 7.7 (L) 7.8 (L) 8.1 (L)  EGFR (Non-African Amer.) Latest Ref Range: >60 mL/min >60 >60 >60  EGFR (African American) Latest Ref Range: >60 mL/min >60 >60 >60  Glucose Latest Ref Range: 65-99 mg/dL 181 (H) 253 (H) 163 (H)  Anion gap Latest Ref Range: 5-_1 Alkaline Phosphatase Latest Ref Range: 38-126 U/L 64    Albumin Latest Ref Range: 3.5-5.0 g/dL 2.6 (L)    AST Latest Ref Range: 15-41 U/L 51 (H)    ALT Latest Ref Range: 17-63 U/L 47    Total Protein Latest Ref Range: 6.5-8.1 g/dL 6.7    Total Bilirubin Latest Ref Range: 0.3-1.2 mg/dL 0.8    WBC Latest Ref Range: 4.0-10.5 K/uL 8.5 15.8 (H) 13.4 (H)  RBC Latest Ref Range: 4.22-5.81 MIL/uL 3.47 (L) 3.31 (L) 3.56 (L)  Hemoglobin Latest Ref Range: 13.0-17.0 g/dL 10.2 (L) 9.6 (L) 10.6 (L)  HCT Latest Ref Range: 39.0-52.0 % 32.5 (L) 30.4 (L) 33.1 (L)  MCV Latest Ref Range: 78.0-100.0 fL 93.7 91.8 93.0  MCH Latest Ref Range: 26.0-34.0 pg 29.4 29.0 29.8  MCHC Latest Ref Range: 30.0-36.0 g/dL 31.4 31.6 32.0  RDW Latest Ref Range: 11.5-15.5 % 14.8 14.9 15.0  Platelets Latest Ref Range: 150-400 K/uL 201 251 262  Vancomycin Tr Latest Ref Range: 10.0-20.0 ug/mL 9 (L)  PATHOLOGY:       RADIOGRAPHIC STUDIES: I have personally reviewed the radiological images as listed and agreed with the findings in the report. Study Result     CLINICAL DATA: Initial treatment strategy for esophageal cancer.  EXAM: NUCLEAR MEDICINE PET SKULL BASE TO THIGH  TECHNIQUE: 12.54 mCi F-18 FDG was  injected intravenously. Full-ring PET imaging was performed from the skull base to thigh after the radiotracer. CT data was obtained and used for attenuation correction and anatomic localization.  FASTING BLOOD GLUCOSE: Value: 93 mg/dl  COMPARISON: CT scans 03/01/2015 and 03/08/2015  FINDINGS: NECK  No hypermetabolic lymph nodes in the neck.  CHEST  Elongated esophageal mass involving the distal esophagus is markedly hypermetabolic with SUV max of 92.33. The small mediastinal lymph nodes are not clearly hypermetabolic but still somewhat worrisome. 10 mm gastrohepatic ligament lymph node is hypermetabolic with SUV max of 8.3.  Improved left-sided pleural effusion. Wedge-shaped area of atelectasis or infiltrate is noted with SUV max of 5.44. The small residual pleural effusion is also mildly hypermetabolic with SUV max of 3.6. This could be infectious.  ABDOMEN/PELVIS  Hypermetabolic lesions suspected in the right hepatic lobe posteriorly. This has an SUV max of 5.0. I do not see an obvious lesion on the CT scan but this is suspicious for metastasis. MRI abdomen without and with contrast may be helpful for further evaluation.  Borderline enlarged celiac axis and periportal lymph nodes are not hypermetabolic. No retroperitoneal adenopathy. No significant findings in the pelvis. No inguinal adenopathy.  SKELETON  No hypermetabolic or worrisome bone lesions.  IMPRESSION: 1. Elongated distal esophageal mass is markedly hypermetabolic and consistent with esophageal cancer. Small mediastinal lymph nodes are not hypermetabolic. There is a single 10 mm gastrohepatic ligament lymph node which is hypermetabolic. 2. Suspect a right hepatic lobe metastatic lesion. MRI abdomen without with contrast may be helpful for further evaluation. 3. Persistent but improved left lower lobe airspace process and left pleural effusion. These areas are hypermetabolic but likely  due to infection.   Electronically Signed  By: Marijo Sanes M.D.  On: 04/05/2015 11:32    ASSESSMENT & PLAN:  Poorly differentiated esophageal carcinoma, distal esophagus HX IVDA, Alcohol use Hepatitis C Dysphagia COPD Anemia   Advised for the patient to continue a normalized routine at home which will help with his current anxiety and depression related to his diagnosis. Encouraged him to get out when the weather warms up and be more active.  I have increased his dose of prozac, from 10 mg to 20 mg, to manage his current depression, anxiety, and insomnia.   The patient has had difficulty managing his blood sugar. I have advised for him to record his blood sugar levels throughout the day in a notebook and to bring that notebook with him to our next appointment. I reassured the patient and his wife that we can help manage his sugars throughout his therapy.   He will continue radiation with Dr. Isidore Moos and begin chemotherapy next Monday, 2/20. We will see him for follow up when he comes in for treatment on Monday.  I have advised the patient and his wife that we are available during clinic hours everyday for assistance.  I again reminded them to notify us if he is struggling with nausea, pain or ongoing difficulties with depression.  All questions were answered. The patient knows to call the clinic with any problems, questions or concerns.  This document serves as a record of services personally performed by  Ancil Linsey, MD. It was created on her behalf by Arlyce Harman, a trained medical scribe. The creation of this record is based on the scribe's personal observations and the provider's statements to them. This document has been checked and approved by the attending provider.  I have reviewed the above documentation for accuracy and completeness, and I agree with the above.  This note was electronically signed.  Molli Hazard, MD  04/26/2015 9:17 AM

## 2015-04-26 NOTE — Patient Instructions (Addendum)
[place] at [facility] Discharge Instructions  RECOMMENDATIONS MADE BY THE CONSULTANT AND ANY TEST RESULTS WILL BE SENT TO YOUR REFERRING PHYSICIAN.   Exam and discussion by Dr Whitney Muse today Try to keep everything as normal as you can. Can you keep a diary of your blood sugars when you take them. prozac 20 mg daily (this was sent to your pharmacy) [facility] Return to see the doctor as scheduled Return Monday for chemotherapy  Please call the clinic if you have any questions or concerns    Thank you for choosing [place] at [facility] to provide your oncology and hematology care.  To afford each patient quality time with our provider, please arrive at least 15 minutes before your scheduled appointment time.   Beginning January 23rd 2017 lab work for the [facility] will be done in the  Main lab at [facility] on 1st floor. If you have a lab appointment with the [place] please come in thru the  Main Entrance and check in at the main information desk  You need to re-schedule your appointment should you arrive 10 or more minutes late.  We strive to give you quality time with our providers, and arriving late affects you and other patients whose appointments are after yours.  Also, if you no show three or more times for appointments you may be dismissed from the clinic at the providers discretion.     Again, thank you for choosing [facility].  Our hope is that these requests will decrease the amount of time that you wait before being seen by our physicians.       _____________________________________________________________  Should you have questions after your visit to [facility], please contact our office at [phone] between the hours of 8:30 a.m. and 4:30 p.m.  Voicemails left after 4:30 p.m. will not be returned until the following business day.  For prescription refill requests, have your  pharmacy contact our office.

## 2015-04-26 NOTE — Telephone Encounter (Signed)
That's ok, he should address his oncology needs first. Lets push out his appointment for 3 months. If he needs [place] before then he can call and we can get him in sooner. We'll follow along. Let him know we wish him the best with chemotherapy.

## 2015-04-27 ENCOUNTER — Encounter: Payer: Self-pay | Admitting: Internal Medicine

## 2015-04-27 NOTE — Telephone Encounter (Signed)
MADE AN APPT AND MAILED LETTER

## 2015-04-30 ENCOUNTER — Ambulatory Visit (HOSPITAL_COMMUNITY): Payer: Medicaid Other | Admitting: Hematology & Oncology

## 2015-04-30 ENCOUNTER — Encounter (HOSPITAL_BASED_OUTPATIENT_CLINIC_OR_DEPARTMENT_OTHER): Payer: Medicaid Other

## 2015-04-30 ENCOUNTER — Ambulatory Visit (HOSPITAL_COMMUNITY)
Admission: RE | Admit: 2015-04-30 | Discharge: 2015-04-30 | Disposition: A | Discharge status: Home / Self Care | Payer: Medicaid Other | Source: Ambulatory Visit | Attending: Hematology & Oncology | Admitting: Hematology & Oncology

## 2015-04-30 ENCOUNTER — Ambulatory Visit: Payer: Medicaid Other | Admitting: Nurse Practitioner

## 2015-04-30 VITALS — BP 116/89 | HR 82 | Temp 98.3°F | Resp 18

## 2015-04-30 DIAGNOSIS — C155 Malignant neoplasm of lower third of esophagus: Secondary | ICD-10-CM

## 2015-04-30 DIAGNOSIS — K7689 Other specified diseases of liver: Secondary | ICD-10-CM | POA: Diagnosis not present

## 2015-04-30 DIAGNOSIS — K8020 Calculus of gallbladder without cholecystitis without obstruction: Secondary | ICD-10-CM | POA: Insufficient documentation

## 2015-04-30 DIAGNOSIS — R740 Nonspecific elevation of levels of transaminase and lactic acid dehydrogenase [LDH]: Secondary | ICD-10-CM | POA: Insufficient documentation

## 2015-04-30 DIAGNOSIS — K769 Liver disease, unspecified: Secondary | ICD-10-CM

## 2015-04-30 DIAGNOSIS — K228 Other specified diseases of esophagus: Secondary | ICD-10-CM | POA: Diagnosis not present

## 2015-04-30 DIAGNOSIS — Z5111 Encounter for antineoplastic chemotherapy: Secondary | ICD-10-CM | POA: Diagnosis not present

## 2015-04-30 DIAGNOSIS — R7401 Elevation of levels of liver transaminase levels: Secondary | ICD-10-CM

## 2015-04-30 DIAGNOSIS — K229 Disease of esophagus, unspecified: Secondary | ICD-10-CM | POA: Diagnosis present

## 2015-04-30 DIAGNOSIS — K2289 Other specified disease of esophagus: Secondary | ICD-10-CM

## 2015-04-30 LAB — CBC WITH DIFFERENTIAL/PLATELET
BASOS ABS: 0 10*3/uL (ref 0.0–0.1)
Basophils Relative: 0 %
EOS PCT: 2 %
Eosinophils Absolute: 0.1 10*3/uL (ref 0.0–0.7)
HEMATOCRIT: 35.2 % — AB (ref 39.0–52.0)
Hemoglobin: 11.2 g/dL — ABNORMAL LOW (ref 13.0–17.0)
LYMPHS ABS: 0.9 10*3/uL (ref 0.7–4.0)
LYMPHS PCT: 18 %
MCH: 29.2 pg (ref 26.0–34.0)
MCHC: 31.8 g/dL (ref 30.0–36.0)
MCV: 91.9 fL (ref 78.0–100.0)
MONO ABS: 0.5 10*3/uL (ref 0.1–1.0)
Monocytes Relative: 10 %
NEUTROS ABS: 3.6 10*3/uL (ref 1.7–7.7)
Neutrophils Relative %: 70 %
PLATELETS: 291 10*3/uL (ref 150–400)
RBC: 3.83 MIL/uL — AB (ref 4.22–5.81)
RDW: 15.4 % (ref 11.5–15.5)
WBC: 5.1 10*3/uL (ref 4.0–10.5)

## 2015-04-30 LAB — COMPREHENSIVE METABOLIC PANEL
ALBUMIN: 3.3 g/dL — AB (ref 3.5–5.0)
ALT: 88 U/L — ABNORMAL HIGH (ref 17–63)
AST: 53 U/L — AB (ref 15–41)
Alkaline Phosphatase: 73 U/L (ref 38–126)
Anion gap: 11 (ref 5–15)
BUN: 18 mg/dL (ref 6–20)
CHLORIDE: 95 mmol/L — AB (ref 101–111)
CO2: 28 mmol/L (ref 22–32)
Calcium: 8.1 mg/dL — ABNORMAL LOW (ref 8.9–10.3)
Creatinine, Ser: 0.74 mg/dL (ref 0.61–1.24)
GFR calc Af Amer: >60 mL/min (ref >60)
GFR calc non Af Amer: >60 mL/min (ref >60)
GLUCOSE: 336 mg/dL — AB (ref 65–99)
POTASSIUM: 4.8 mmol/L (ref 3.5–5.1)
SODIUM: 134 mmol/L — AB (ref 135–145)
Total Bilirubin: 0.3 mg/dL (ref 0.3–1.2)
Total Protein: 6.7 g/dL (ref 6.5–8.1)

## 2015-04-30 LAB — MAGNESIUM: Magnesium: 2 mg/dL (ref 1.7–2.4)

## 2015-04-30 MED ORDER — DIPHENHYDRAMINE HCL 50 MG/ML IJ SOLN
50.0000 mg | Freq: Once | INTRAMUSCULAR | Status: AC
  Administered 2015-04-30: 50 mg via INTRAVENOUS
  Filled 2015-04-30: qty 1

## 2015-04-30 MED ORDER — SODIUM CHLORIDE 0.9% FLUSH: 10.0000 mL | INTRAVENOUS | Status: DC | PRN

## 2015-04-30 MED ORDER — PALONOSETRON HCL INJECTION 0.25 MG/5ML
0.2500 mg | Freq: Once | INTRAVENOUS | Status: AC
  Administered 2015-04-30: 0.25 mg via INTRAVENOUS
  Filled 2015-04-30: qty 5

## 2015-04-30 MED ORDER — SODIUM CHLORIDE 0.9 % IV SOLN
263.0000 mg | Freq: Once | INTRAVENOUS | Status: AC
  Administered 2015-04-30: 260 mg via INTRAVENOUS
  Filled 2015-04-30: qty 26

## 2015-04-30 MED ORDER — SODIUM CHLORIDE 0.9 % IV SOLN
Freq: Once | INTRAVENOUS | Status: AC
  Administered 2015-04-30: 10:00:00 via INTRAVENOUS

## 2015-04-30 MED ORDER — PALONOSETRON HCL INJECTION 0.25 MG/5ML
INTRAVENOUS | Status: AC
  Filled 2015-04-30: qty 5

## 2015-04-30 MED ORDER — FAMOTIDINE IN NACL 20-0.9 MG/50ML-% IV SOLN
20.0000 mg | Freq: Once | INTRAVENOUS | Status: AC
  Administered 2015-04-30: 20 mg via INTRAVENOUS
  Filled 2015-04-30: qty 50

## 2015-04-30 MED ORDER — HEPARIN SOD (PORK) LOCK FLUSH 100 UNIT/ML IV SOLN
500.0000 [IU] | Freq: Once | INTRAVENOUS | Status: AC | PRN
  Administered 2015-04-30: 500 [IU]
  Filled 2015-04-30 (×2): qty 5

## 2015-04-30 MED ORDER — PACLITAXEL CHEMO INJECTION 300 MG/50ML
50.0000 mg/m2 | Freq: Once | INTRAVENOUS | Status: AC
  Administered 2015-04-30: 102 mg via INTRAVENOUS
  Filled 2015-04-30: qty 17

## 2015-04-30 MED ORDER — GADOBENATE DIMEGLUMINE 529 MG/ML IV SOLN
15.0000 mL | Freq: Once | INTRAVENOUS | Status: AC | PRN
  Administered 2015-04-30: 15 mL via INTRAVENOUS

## 2015-04-30 MED ORDER — DEXAMETHASONE SODIUM PHOSPHATE 100 MG/10ML IJ SOLN
20.0000 mg | Freq: Once | INTRAMUSCULAR | Status: AC
  Administered 2015-04-30: 20 mg via INTRAVENOUS
  Filled 2015-04-30: qty 2

## 2015-04-30 NOTE — Progress Notes (Signed)
Patient tolerated chemotherapy infusion well.  VSS.  Patient went down to MRI after infusion with port accessed, and then returned to the floor to have port de-accessed.

## 2015-04-30 NOTE — Patient Instructions (Signed)
[facility] Discharge Instructions for Patients Receiving Chemotherapy   Beginning January 23rd 2017 lab work for the [facility] will be done in the  Main lab at [facility] on 1st floor. If you have a lab appointment with the [place] please come in thru the  Main Entrance and check in at the main information desk   Today you received the following chemotherapy agents: Taxol and Carboplatin.     If you develop nausea and vomiting, or diarrhea that is not controlled by your medication, call the clinic.  The clinic phone number is [phone]. Office hours are Monday-Friday 8:30am-5:00pm.  BELOW ARE SYMPTOMS THAT SHOULD BE REPORTED IMMEDIATELY:  *FEVER GREATER THAN 101.0 F  *CHILLS WITH OR WITHOUT FEVER  NAUSEA AND VOMITING THAT IS NOT CONTROLLED WITH YOUR NAUSEA MEDICATION  *UNUSUAL SHORTNESS OF BREATH  *UNUSUAL BRUISING OR BLEEDING  TENDERNESS IN MOUTH AND THROAT WITH OR WITHOUT PRESENCE OF ULCERS  *URINARY PROBLEMS  *BOWEL PROBLEMS  UNUSUAL RASH Items with * indicate a potential emergency and should be followed up as soon as possible. If you have an emergency after office hours please contact your primary care physician or go to the nearest emergency department.  Please call the clinic during office hours if you have any questions or concerns.   You may also contact the Patient Navigator at [phone] should you have any questions or need assistance in obtaining follow up care.

## 2015-05-02 ENCOUNTER — Encounter (HOSPITAL_COMMUNITY): Payer: Medicaid Other

## 2015-05-02 ENCOUNTER — Other Ambulatory Visit (HOSPITAL_COMMUNITY): Payer: Self-pay | Admitting: *Deleted

## 2015-05-02 DIAGNOSIS — K229 Disease of esophagus, unspecified: Secondary | ICD-10-CM | POA: Diagnosis not present

## 2015-05-02 DIAGNOSIS — C155 Malignant neoplasm of lower third of esophagus: Secondary | ICD-10-CM

## 2015-05-02 LAB — CBC WITH DIFFERENTIAL/PLATELET
BASOS PCT: 0 %
Basophils Absolute: 0 10*3/uL (ref 0.0–0.1)
EOS ABS: 0 10*3/uL (ref 0.0–0.7)
Eosinophils Relative: 0 %
HEMATOCRIT: 35.1 % — AB (ref 39.0–52.0)
HEMOGLOBIN: 11.4 g/dL — AB (ref 13.0–17.0)
LYMPHS ABS: 0.7 10*3/uL (ref 0.7–4.0)
LYMPHS PCT: 5 %
MCH: 29.5 pg (ref 26.0–34.0)
MCHC: 32.5 g/dL (ref 30.0–36.0)
MCV: 90.7 fL (ref 78.0–100.0)
MONO ABS: 0.7 10*3/uL (ref 0.1–1.0)
MONOS PCT: 6 %
NEUTROS ABS: 11.9 10*3/uL — AB (ref 1.7–7.7)
NEUTROS PCT: 89 %
Platelets: 294 10*3/uL (ref 150–400)
RBC: 3.87 MIL/uL — ABNORMAL LOW (ref 4.22–5.81)
RDW: 15.9 % — AB (ref 11.5–15.5)
WBC: 13.3 10*3/uL — ABNORMAL HIGH (ref 4.0–10.5)

## 2015-05-02 MED ORDER — LEVOFLOXACIN 500 MG PO TABS: 500.0000 mg | ORAL_TABLET | Freq: Every day | ORAL | Status: DC

## 2015-05-04 ENCOUNTER — Telehealth (HOSPITAL_COMMUNITY): Payer: Self-pay

## 2015-05-04 ENCOUNTER — Ambulatory Visit (HOSPITAL_COMMUNITY): Payer: Medicaid Other | Admitting: Hematology & Oncology

## 2015-05-04 ENCOUNTER — Inpatient Hospital Stay (HOSPITAL_COMMUNITY): Payer: Medicaid Other

## 2015-05-04 NOTE — Telephone Encounter (Signed)
Call to patient's home for follow up after chemotherapy.  Wife states that he has had some nausea but is taking the Zofran and Compazine which is relieving it.  He is able to eat some with a decreased appetite over the past couple of weeks and has been able to drink plenty of fluids without emesis.  No other complaints.  Encouraged the wife to call the clinic if she had any further concern.

## 2015-05-05 NOTE — Progress Notes (Signed)
Brent Spry, NP [address] [place] [address]  Malignant neoplasm of lower third of esophagus ([place]) - Plan: CBC with Differential, Comprehensive metabolic panel, oxyCODONE-acetaminophen (ROXICET) 5-325 MG tablet, filgrastim (NEUPOGEN) injection 480 mcg, filgrastim (NEUPOGEN) injection 480 mcg, filgrastim (NEUPOGEN) injection 480 mcg, filgrastim (NEUPOGEN) injection 480 mcg  Esophageal pain - Plan: oxyCODONE-acetaminophen (ROXICET) 5-325 MG tablet  Elevated transaminase level - Plan: oxyCODONE-acetaminophen (ROXICET) 5-325 MG tablet  Liver lesion - Plan: oxyCODONE-acetaminophen (ROXICET) 5-325 MG tablet  CURRENT THERAPY: Concomitant chemoradiation consisting of weekly Carboplatin/Paclitaxel beginning on 04/30/2015.  INTERVAL HISTORY: Brent Franco 65 y.o. 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    Esophageal cancer ([place])   03/08/2015 Imaging CT chest at [facility]- abnormal adenopathy (hilar and mediastinal)   03/26/2015 Procedure EGD by Dr. Gala Romney with abnormal findings and biopsy   03/26/2015 Pathology Results Esophagus, biopsy - INVASIVE POORLY DIFFERENTIATED CARCINOMA, SEE COMMENT. - EXTENSIVE ULCERATION WITH FUNGAL ORGANISMS.   04/05/2015 PET scan Elongated distal esophageal mass is markedly hypermetabolic. Small mediastinal lymph nodes are not hypermetabolic. Single 10 mm gastrohepatic ligament lymph node which is hypermetabolic. Suspect a right hepatic lobe metastatic lesion.   04/15/2015 - 04/21/2015 Hospital Admission [facility]   04/30/2015 Imaging MRI abd- 1.5 cm hepatic lesion in segment 7, corresponding to the hypermetabolic lesion on CT, suspicious for metastasis. Two upper abdominal lymph nodes measuring up to 1.3  cm short axis, suspicious for nodal metastases.   04/30/2015 -  Chemotherapy Weekly Carboplatin/Paclitaxel with XRT    Radiation Therapy Dr. Isidore Moos   This is my first time meeting the patient today.  I personally reviewed and went over laboratory results with the patient.  The results are noted within this dictation.Labs today demonstrate a hyperglycemia 251, white blood cell count of 3.1, and ANC of 2.0. He meets parameters for treatment today.  He is tolerating therapy well. He notes one day of illness following treatment last week. He noted some nausea with vomiting at that point time. He reports that his home antiemetics are effective for him. No nausea or vomiting since last week.  His weight is stable today.  He weighs 177.7 pounds today, compared to 179 pounds on 04/26/2015, compared to 177.8 pounds on 03/30/2015. Overall, weight is stable. He reports his appetite is stable as well. He is eating soft foods. He knows what to avoid with regards to difficult to swallow foods.  He notes that his pain is well controlled on current regimen. He is taking OxyContin 20 mg every 8 hours. He is compliant with this medication. He is utilizing breakthrough pain management consisting of Roxicet. He is noted to have gone through approximately 50 tablets in 2 weeks' time. He is averaging about 5-6 tablets per day. We will refill his medication today. If this continues to be his trend, we may need to increase his OxyContin to improve pain management/decrease short acting narcotic needs.  He was prescribed meloxicam by his primary care physician for arthritic pain. From an oncology standpoint, he is able to take this. His renal function is adequate as well.   He is provided education regarding his white blood cell count today. He knows of systemic chemotherapy can decrease his blood counts, including his white blood cell count. Therefore,  I discussed the role of Neupogen therapy with regards to his treatment.  His doses calculated at approximate 480 g per day. He is agreeable to this supportive plan.   Past Medical History  Diagnosis Date  . COPD (chronic obstructive pulmonary disease) ([place])   . ETOH abuse     stopped 10 years ago  . Depression     suicide attempt 20 years ago  . Arthritis   . Asthma   . Viral hepatitis C   . Diabetes mellitus without complication ([place])   . Hypothyroidism   . Cancer ([place])     esophageal  . Pneumonia 02/2015  . Itching     both feet, known has athlet's feet now    has Chest pain; Elevated transaminase level; COPD (chronic obstructive pulmonary disease) ([place]); Depression; History of hepatitis C; Dysphagia; Encounter for screening colonoscopy; Mucosal abnormality of esophagus; History of colonic polyps; Diverticulosis of colon without hemorrhage; Esophageal cancer ([place]); Liver lesion; Sepsis ([place]); Pneumonia; Healthcare-associated pneumonia; and SOB (shortness of breath) on his problem list.     is allergic to demerol.  Current Outpatient Prescriptions on File Prior to Visit  Medication Sig Dispense Refill  . acetaminophen (TYLENOL) 500 MG tablet Take 500 mg by mouth every 6 (six) hours as needed for fever.    Marland Kitchen albuterol (PROVENTIL HFA;VENTOLIN HFA) 108 (90 BASE) MCG/ACT inhaler Inhale 1 puff into the lungs every 6 (six) hours as needed for wheezing or shortness of breath.    . ALPRAZolam (XANAX) 1 MG tablet Take 1 tablet (1 mg total) by mouth every 8 (eight) hours as needed for anxiety. 90 tablet 1  . docusate sodium (COLACE) 100 MG capsule Take 100 mg by mouth 2 (two) times daily.  4  . FLUoxetine (PROZAC) 20 MG capsule Take 1 capsule (20 mg total) by mouth daily. 30 capsule 3  . HUMULIN R 100 UNIT/ML injection Inject 2-12 Units into the skin 2 (two) times daily as needed for high blood sugar. 151-200 = 2 units, 201-250 = 4 units, 251-300 = 6 units, 301-350 = 8 units, 351-400 = 10 units, 401-450 = 12 units.  2  . ipratropium-albuterol (DUONEB) 0.5-2.5  (3) MG/3ML SOLN INHALE CONTENTS OF 1 VIAL IN NEBULIZER EVERY 4 HOURS AS NEEDED FOR SHORTNESS OF BREATH.  3  . ketoconazole (NIZORAL) 2 % cream Apply 1 application topically 2 (two) times daily.  6  . levofloxacin (LEVAQUIN) 500 MG tablet Take 1 tablet (500 mg total) by mouth daily. 10 tablet 0  . levothyroxine (SYNTHROID, LEVOTHROID) 25 MCG tablet Take 25 mcg by mouth daily.  5  . lidocaine-prilocaine (EMLA) cream Apply a quarter size amount to port site 1 hour prior to chemo. Do not rub in. Cover with plastic wrap. 30 g 3  . metFORMIN (GLUCOPHAGE) 500 MG tablet Take 500 mg by mouth 2 (two) times daily.  3  . mometasone (NASONEX) 50 MCG/ACT nasal spray Place 2 sprays into the nose daily as needed (congestion).     . nitroGLYCERIN (NITROSTAT) 0.4 MG SL tablet Place 1 tablet (0.4 mg total) under the tongue every 5 (five) minutes as needed for chest pain. 25 tablet 3  . ondansetron (ZOFRAN) 8 MG tablet Take 1 tablet (8 mg total) by mouth every 8 (eight) hours as needed for nausea or vomiting. 30 tablet 2  . oxyCODONE (OXYCONTIN) 20 mg 12 hr tablet Take 1 tablet (20 mg total) by mouth every 8 (eight) hours. 90 tablet 0  . predniSONE (DELTASONE)  10 MG tablet Take 1 tablet (10 mg total) by mouth daily with breakfast. Take 6 tablets today and then decrease by 1 tablet daily until none are left. 21 tablet 0  . prochlorperazine (COMPAZINE) 10 MG tablet Take 1 tablet (10 mg total) by mouth every 6 (six) hours as needed for nausea or vomiting. 30 tablet 2  . Pseudoeph-Doxylamine-DM-APAP (NYQUIL MULTI-SYMPTOM PO) Take 1 tablet by mouth daily as needed (congestion/cold).     . SPIRIVA HANDIHALER 18 MCG inhalation capsule Place 1 puff into inhaler and inhale daily.  5   No current facility-administered medications on file prior to visit.    Past Surgical History  Procedure Laterality Date  . Other surgical history Left 1995    fatty tissue tumor right upper thigh  . Dental surgery N/A January 2016     "dentures and spurs"   . Tumor excision Left 1990    inner thigh  . Colonoscopy with propofol N/A 03/26/2015    Procedure: COLONOSCOPY WITH PROPOFOL;  Surgeon: Daneil Dolin, MD;  [facility];  Service: Endoscopy;  Laterality: N/A;  1100  . Esophagogastroduodenoscopy (egd) with propofol N/A 03/26/2015    Procedure: ESOPHAGOGASTRODUODENOSCOPY (EGD) WITH PROPOFOL;  Surgeon: Daneil Dolin, MD;  [facility];  Service: Endoscopy;  Laterality: N/A;  . Biopsy  03/26/2015    Procedure: BIOPSY;  Surgeon: Daneil Dolin, MD;  [facility];  Service: Endoscopy;;  esophageal mass  . Polypectomy  03/26/2015    Procedure: POLYPECTOMY;  Surgeon: Daneil Dolin, MD;  [facility];  Service: Endoscopy;;  polypectomy at ileocecal valve    Denies any headaches, dizziness, double vision, fevers, chills, night sweats, , diarrhea, constipation, chest pain, heart palpitations, shortness of breath, blood in stool, black tarry stool, urinary pain, urinary burning, urinary frequency, hematuria.   PHYSICAL EXAMINATION  ECOG PERFORMANCE STATUS: 1 - Symptomatic but completely ambulatory  There were no vitals filed for this visit.   Weight 177.7 pounds. Blood pressure is 134/82 Pulse is 99 Respirations is 18 Temperature is 97.21F Oxygen saturation is 95% on room air  GENERAL:alert, no distress, comfortable, cooperative, smiling, and in chemotherapy bed, and accompanied by his wife, [name]. SKIN: skin color, texture, turgor are normal, no rashes or significant lesions HEAD: Normocephalic, No masses, lesions, tenderness or abnormalities EYES: normal, Conjunctiva are pink and non-injected EARS: External ears normal OROPHARYNX:lips, buccal mucosa, and tongue normal and mucous membranes are moist  NECK: supple, trachea midline LYMPH:  not examined BREAST:not examined LUNGS: clear to auscultation and percussion HEART: regular rate & rhythm, no murmurs, no gallops and S1  and S2 normal ABDOMEN:abdomen soft, non-tender, normal bowel sounds and no masses or organomegaly BACK: Back symmetric, no curvature. EXTREMITIES:less then 2 second capillary refill, no joint deformities, effusion, or inflammation, no skin discoloration, no cyanosis  NEURO: alert & oriented x 3 with fluent speech, no focal motor/sensory deficits   LABORATORY DATA: CBC    Component Value Date/Time   WBC 3.1* 05/07/2015 0945   RBC 3.64* 05/07/2015 0945   HGB 10.7* 05/07/2015 0945   HCT 32.9* 05/07/2015 0945   PLT 201 05/07/2015 0945   MCV 90.4 05/07/2015 0945   MCH 29.4 05/07/2015 0945   MCHC 32.5 05/07/2015 0945   RDW 15.2 05/07/2015 0945   LYMPHSABS 0.5* 05/07/2015 0945   MONOABS 0.5 05/07/2015 0945   EOSABS 0.1 05/07/2015 0945   BASOSABS 0.0 05/07/2015 0945      Chemistry  Component Value Date/Time   NA 135 05/07/2015 0945   K 4.1 05/07/2015 0945   CL 99* 05/07/2015 0945   CO2 28 05/07/2015 0945   BUN 11 05/07/2015 0945   CREATININE 0.65 05/07/2015 0945   CREATININE 0.78 01/18/2015 0951      Component Value Date/Time   CALCIUM 8.4* 05/07/2015 0945   ALKPHOS 66 05/07/2015 0945   AST 36 05/07/2015 0945   ALT 57 05/07/2015 0945   BILITOT 0.2* 05/07/2015 0945        PENDING LABS:   RADIOGRAPHIC STUDIES:  Mr Abdomen W Wo Contrast  04/30/2015  CLINICAL DATA:  Esophageal cancer.  Possible liver lesion on PET. EXAM: MRI ABDOMEN WITHOUT AND WITH CONTRAST TECHNIQUE: Multiplanar multisequence MR imaging of the abdomen was performed both before and after the administration of intravenous contrast. CONTRAST:  69mL MULTIHANCE GADOBENATE DIMEGLUMINE 529 MG/ML IV SOLN COMPARISON:  PET-CT dated 04/05/2015. FINDINGS: Lower chest: 2.0 cm patchy/nodular opacity in the posterior right lower lobe (series 6/image 2), new, likely reflecting atelectasis or possibly infection. Lower esophageal wall thickening, likely corresponding to known primary esophageal neoplasm. Hepatobiliary:  1.5 cm T2 hyperintense lesion in segment 7 (series 27/image 13), with vague enhancement, and corresponding to the hypermetabolic lesion on PET, suspicious for metastasis. Two additional T2 hyperintense lesions measuring up to 5 mm may reflect cysts (series 6/ images 9 and 20). Layering gallstones (series 6/ image 22), without associated inflammatory changes. No intrahepatic or extrahepatic ductal dilatation. Pancreas: Within normal limits. Spleen: Within normal limits. Adrenals/Urinary Tract: Adrenal glands are within normal limits. Kidneys are within normal limits.  No hydronephrosis. Stomach/Bowel: Stomach is within normal limits. Visualized bowel is unremarkable. Vascular/Lymphatic: No evidence of abdominal aortic aneurysm. Small upper abdominal lymph nodes, including: --13 mm short axis gastrohepatic node (series 6/image 17), suspicious --11 mm short axis node in the porta hepatis (series 6/ image 21), indeterminate Other: No abdominal ascites. Musculoskeletal: No focal osseous lesions. IMPRESSION: Lower esophageal wall thickening, likely corresponding to known primary esophageal neoplasm. 1.5 cm hepatic lesion in segment 7, corresponding to the hypermetabolic lesion on CT, suspicious for metastasis. Two upper abdominal lymph nodes measuring up to 1.3 cm short axis, suspicious for nodal metastases. Cholelithiasis. Electronically Signed   By: Julian Hy M.D.   On: 04/30/2015 17:58   Dg Chest Port 1 View  04/17/2015  CLINICAL DATA:  Fever and confusion with shortness of Breath EXAM: PORTABLE CHEST 1 VIEW COMPARISON:  04/15/2015 FINDINGS: Cardiac shadow is stable. Right chest wall port is again seen and stable. Name patchy changes are noted bilaterally predominately in a perihilar distribution suggestive of CHF. This may however represent multi focus pneumonias. Continued followup is recommended. IMPRESSION: New multifocal patchy infiltrates bilaterally as described. Electronically Signed   By: Inez Catalina M.D.   On: 04/17/2015 16:17   Dg Chest Portable 1 View  04/15/2015  CLINICAL DATA:  Cough and fever.  Esophageal cancer EXAM: PORTABLE CHEST 1 VIEW COMPARISON:  03/23/2015 FINDINGS: Progression of left lower lobe infiltrate may represent recurrent pneumonia. No significant effusion. Right lung clear. Port-A-Cath tip in the SVC. Negative for heart failure. IMPRESSION: Progression of left lower lobe infiltrate since prior study which may represent recurrent pneumonia. Electronically Signed   By: Franchot Gallo M.D.   On: 04/15/2015 16:32   Dg Abd 2 Views  04/20/2015  CLINICAL DATA:  Esophageal cancer, abdominal pain for couple weeks, COPD, ethanol abuse, diabetes mellitus, hepatitis, former smoker EXAM: ABDOMEN - 2 VIEW COMPARISON:  03/06/2015  FINDINGS: Lung bases grossly clear. Nonobstructive bowel gas pattern. No bowel dilatation or bowel wall thickening. Prominent stool in rectum. Few sigmoid diverticula noted. No free intraperitoneal air. Osseous structures unremarkable. No urinary tract calcifications. IMPRESSION: Mildly prominent stool in rectum with note of mild sigmoid diverticulosis. Otherwise normal exam. Electronically Signed   By: Lavonia Dana M.D.   On: 04/20/2015 14:36     PATHOLOGY:    ASSESSMENT AND PLAN:  Esophageal cancer ([place]) Poorly differentiated esophageal carcinoma (unable to identify squamous cell versus adenocarcinoma) with metastatic disease to liver and lymph nodes radiographically, with lesion beginning at 35 cm from incisors extending down to 39 cm; approximately 1 cm superior to GE junction.  Began concomitant chemoradiation on 04/30/2015 with weekly Carboplatin/Paclitaxel.  Oncology history developed.  Pre-treatment labs today as planned.  Labs me treatment parameters today, but ANC is 2.0. As result, we will give Neupogen 480 g daily through Friday, beginning on Tuesday. These orders are signed and held.  He is educated on the role of Neupogen. Risks, benefits,  alternatives, and side effects of Neupogen intervention are reviewed.  Hyperglycemia is also noted and orders place for 4 units of insulin today.  He was given meloxicam from his primary care physician. He may take this for arthritis related pain.  I refilled his oxycodone. I think that if he continues utilizing oxycodone in the particular manner he is using a, we will need to increase his OxyContin. 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 We'll reevaluate at return appointment next week.  Return next week for follow-up and cycle #3.    THERAPY PLAN:  Continue concomitant XRT and chemotherapy as planned.  All questions were answered. The patient knows to call the clinic with any problems, questions or concerns. We can certainly see the patient much sooner if necessary.  Patient and plan discussed with Dr. Ancil Linsey and she is in agreement with the aforementioned.   This note is electronically signed by: Doy Mince 05/07/2015 5:36 PM

## 2015-05-05 NOTE — Assessment & Plan Note (Addendum)
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  Began concomitant chemoradiation on 04/30/2015 with weekly Carboplatin/Paclitaxel.  Oncology history developed.  Pre-treatment labs today as planned.  Labs me treatment parameters today, but ANC is 2.0. As result, we will give Neupogen 480 g daily through Friday, beginning on Tuesday. These orders are signed and held.  He is educated on the role of Neupogen. Risks, benefits, alternatives, and side effects of Neupogen intervention are reviewed.  Hyperglycemia is also noted and orders place for 4 units of insulin today.  He was given meloxicam from his primary care physician. He may take this for arthritis related pain.  I refilled his oxycodone. I think that if he continues utilizing oxycodone in the particular manner he is using a, we will need to increase his OxyContin. 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 We'll reevaluate at return appointment next week.  Return next week for follow-up and cycle #3.

## 2015-05-07 ENCOUNTER — Inpatient Hospital Stay (HOSPITAL_COMMUNITY): Payer: Medicaid Other

## 2015-05-07 ENCOUNTER — Ambulatory Visit (HOSPITAL_COMMUNITY): Payer: Medicaid Other | Admitting: Oncology

## 2015-05-07 ENCOUNTER — Encounter (HOSPITAL_BASED_OUTPATIENT_CLINIC_OR_DEPARTMENT_OTHER): Payer: Medicaid Other | Admitting: Oncology

## 2015-05-07 ENCOUNTER — Encounter (HOSPITAL_BASED_OUTPATIENT_CLINIC_OR_DEPARTMENT_OTHER): Payer: Medicaid Other

## 2015-05-07 ENCOUNTER — Encounter (HOSPITAL_COMMUNITY): Payer: Self-pay | Admitting: Oncology

## 2015-05-07 VITALS — BP 134/84 | HR 92 | Temp 97.5°F | Resp 18 | Wt 177.7 lb

## 2015-05-07 DIAGNOSIS — C155 Malignant neoplasm of lower third of esophagus: Secondary | ICD-10-CM

## 2015-05-07 DIAGNOSIS — G893 Neoplasm related pain (acute) (chronic): Secondary | ICD-10-CM

## 2015-05-07 DIAGNOSIS — Z5111 Encounter for antineoplastic chemotherapy: Secondary | ICD-10-CM

## 2015-05-07 DIAGNOSIS — R739 Hyperglycemia, unspecified: Secondary | ICD-10-CM | POA: Diagnosis not present

## 2015-05-07 DIAGNOSIS — R7401 Elevation of levels of liver transaminase levels: Secondary | ICD-10-CM

## 2015-05-07 DIAGNOSIS — R740 Nonspecific elevation of levels of transaminase and lactic acid dehydrogenase [LDH]: Secondary | ICD-10-CM

## 2015-05-07 DIAGNOSIS — K2289 Other specified disease of esophagus: Secondary | ICD-10-CM

## 2015-05-07 DIAGNOSIS — K769 Liver disease, unspecified: Secondary | ICD-10-CM

## 2015-05-07 DIAGNOSIS — K229 Disease of esophagus, unspecified: Secondary | ICD-10-CM | POA: Diagnosis not present

## 2015-05-07 DIAGNOSIS — K228 Other specified diseases of esophagus: Secondary | ICD-10-CM

## 2015-05-07 LAB — COMPREHENSIVE METABOLIC PANEL
ALK PHOS: 66 U/L (ref 38–126)
ALT: 57 U/L (ref 17–63)
AST: 36 U/L (ref 15–41)
Albumin: 3 g/dL — ABNORMAL LOW (ref 3.5–5.0)
Anion gap: 8 (ref 5–15)
BILIRUBIN TOTAL: 0.2 mg/dL — AB (ref 0.3–1.2)
BUN: 11 mg/dL (ref 6–20)
CALCIUM: 8.4 mg/dL — AB (ref 8.9–10.3)
CO2: 28 mmol/L (ref 22–32)
CREATININE: 0.65 mg/dL (ref 0.61–1.24)
Chloride: 99 mmol/L — ABNORMAL LOW (ref 101–111)
Glucose, Bld: 251 mg/dL — ABNORMAL HIGH (ref 65–99)
Potassium: 4.1 mmol/L (ref 3.5–5.1)
Sodium: 135 mmol/L (ref 135–145)
TOTAL PROTEIN: 6.7 g/dL (ref 6.5–8.1)

## 2015-05-07 LAB — CBC WITH DIFFERENTIAL/PLATELET
BASOS ABS: 0 10*3/uL (ref 0.0–0.1)
Basophils Relative: 0 %
Eosinophils Absolute: 0.1 10*3/uL (ref 0.0–0.7)
Eosinophils Relative: 3 %
HEMATOCRIT: 32.9 % — AB (ref 39.0–52.0)
Hemoglobin: 10.7 g/dL — ABNORMAL LOW (ref 13.0–17.0)
LYMPHS ABS: 0.5 10*3/uL — AB (ref 0.7–4.0)
LYMPHS PCT: 16 %
MCH: 29.4 pg (ref 26.0–34.0)
MCHC: 32.5 g/dL (ref 30.0–36.0)
MCV: 90.4 fL (ref 78.0–100.0)
Monocytes Absolute: 0.5 10*3/uL (ref 0.1–1.0)
Monocytes Relative: 16 %
NEUTROS ABS: 2 10*3/uL (ref 1.7–7.7)
Neutrophils Relative %: 65 %
Platelets: 201 10*3/uL (ref 150–400)
RBC: 3.64 MIL/uL — AB (ref 4.22–5.81)
RDW: 15.2 % (ref 11.5–15.5)
WBC: 3.1 10*3/uL — AB (ref 4.0–10.5)

## 2015-05-07 MED ORDER — FAMOTIDINE IN NACL 20-0.9 MG/50ML-% IV SOLN
20.0000 mg | Freq: Once | INTRAVENOUS | Status: AC
  Administered 2015-05-07: 20 mg via INTRAVENOUS
  Filled 2015-05-07: qty 50

## 2015-05-07 MED ORDER — INSULIN ASPART 100 UNIT/ML ~~LOC~~ SOLN
4.0000 [IU] | Freq: Once | SUBCUTANEOUS | Status: DC
  Filled 2015-05-07: qty 0.04

## 2015-05-07 MED ORDER — SODIUM CHLORIDE 0.9 % IV SOLN
20.0000 mg | Freq: Once | INTRAVENOUS | Status: AC
  Administered 2015-05-07: 20 mg via INTRAVENOUS
  Filled 2015-05-07: qty 2

## 2015-05-07 MED ORDER — CARBOPLATIN CHEMO INJECTION 450 MG/45ML
265.8000 mg | Freq: Once | INTRAVENOUS | Status: AC
  Administered 2015-05-07: 270 mg via INTRAVENOUS
  Filled 2015-05-07: qty 27

## 2015-05-07 MED ORDER — INSULIN ASPART 100 UNIT/ML ~~LOC~~ SOLN
4.0000 [IU] | Freq: Once | SUBCUTANEOUS | Status: AC
Start: 2015-05-07 — End: 2015-05-07
  Administered 2015-05-07: 4 [IU] via SUBCUTANEOUS
  Filled 2015-05-07: qty 0.04

## 2015-05-07 MED ORDER — HEPARIN SOD (PORK) LOCK FLUSH 100 UNIT/ML IV SOLN
500.0000 [IU] | Freq: Once | INTRAVENOUS | Status: AC | PRN
  Administered 2015-05-07: 500 [IU]
  Filled 2015-05-07: qty 5

## 2015-05-07 MED ORDER — PALONOSETRON HCL INJECTION 0.25 MG/5ML
0.2500 mg | Freq: Once | INTRAVENOUS | Status: AC
  Administered 2015-05-07: 0.25 mg via INTRAVENOUS
  Filled 2015-05-07: qty 5

## 2015-05-07 MED ORDER — OXYCODONE-ACETAMINOPHEN 5-325 MG PO TABS: 1.0000 | ORAL_TABLET | Freq: Four times a day (QID) | ORAL | Status: DC | PRN

## 2015-05-07 MED ORDER — SODIUM CHLORIDE 0.9 % IV SOLN
Freq: Once | INTRAVENOUS | Status: AC
  Administered 2015-05-07: 11:00:00 via INTRAVENOUS

## 2015-05-07 MED ORDER — SODIUM CHLORIDE 0.9% FLUSH
10.0000 mL | INTRAVENOUS | Status: DC | PRN
  Administered 2015-05-07: 10 mL
  Filled 2015-05-07: qty 10

## 2015-05-07 MED ORDER — PACLITAXEL CHEMO INJECTION 300 MG/50ML
50.0000 mg/m2 | Freq: Once | INTRAVENOUS | Status: AC
  Administered 2015-05-07: 102 mg via INTRAVENOUS
  Filled 2015-05-07: qty 17

## 2015-05-07 MED ORDER — DIPHENHYDRAMINE HCL 50 MG/ML IJ SOLN
50.0000 mg | Freq: Once | INTRAMUSCULAR | Status: AC
  Administered 2015-05-07: 50 mg via INTRAVENOUS
  Filled 2015-05-07: qty 1

## 2015-05-07 NOTE — Patient Instructions (Addendum)
[place] at [facility] Discharge Instructions  RECOMMENDATIONS MADE BY THE CONSULTANT AND ANY TEST RESULTS WILL BE SENT TO YOUR REFERRING PHYSICIAN.   Exam and discussion by Kirby Crigler today Your weight is stable You can take your meloxicam Carbo/taxol today if labs are good Carbo/taxol weekly Continue with radiation Please let us know if you have nausea or vomiting or diarrhea that you cant control, you may need to come in for fluids Next week if you are still going through the oxycodone, we can increase the oxycontin. Return to see the doctor as scheduled Refilled oxycodone. Insulin today four your blood sugar being increased. We may add an injection that will help increase your white blood cell count.  It is normal today, but on the low normal side, neupogen Tuesday-Friday this week Please call the clinic if you have any questions or concerns      Thank you for choosing [place] at [facility] to provide your oncology and hematology care.  To afford each patient quality time with our provider, please arrive at least 15 minutes before your scheduled appointment time.   Beginning January 23rd 2017 lab work for the [facility] will be done in the  Main lab at [facility] on 1st floor. If you have a lab appointment with the [facility] please come in thru the  Main Entrance and check in at the main information desk  You need to re-schedule your appointment should you arrive 10 or more minutes late.  We strive to give you quality time with our providers, and arriving late affects you and other patients whose appointments are after yours.  Also, if you no show three or more times for appointments you may be dismissed from the clinic at the providers discretion.     Again, thank you for choosing [facility].  Our hope is that these requests will decrease the amount of time that you wait before being seen by our  physicians.       _____________________________________________________________  Should you have questions after your visit to [facility] [place], please contact our office at [phone] between the hours of 8:30 a.m. and 4:30 p.m.  Voicemails left after 4:30 p.m. will not be returned until the following business day.  For prescription refill requests, have your pharmacy contact our office.

## 2015-05-07 NOTE — Progress Notes (Signed)
Tolerated chemo well. Stable on discharge home with wife via wheelchair. 

## 2015-05-07 NOTE — Patient Instructions (Signed)
[facility] Discharge Instructions for Patients Receiving Chemotherapy  Today you received the following chemotherapy agents Taxol and Carbo week 2.  To help prevent nausea and vomiting after your treatment, we encourage you to take your nausea medication as instructed. If you develop nausea and vomiting that is not controlled by your nausea medication, call the clinic. If it is after clinic hours your family physician or the after hours number for the clinic or go to the Emergency Department. BELOW ARE SYMPTOMS THAT SHOULD BE REPORTED IMMEDIATELY:  *FEVER GREATER THAN 101.0 F  *CHILLS WITH OR WITHOUT FEVER  NAUSEA AND VOMITING THAT IS NOT CONTROLLED WITH YOUR NAUSEA MEDICATION  *UNUSUAL SHORTNESS OF BREATH  *UNUSUAL BRUISING OR BLEEDING  TENDERNESS IN MOUTH AND THROAT WITH OR WITHOUT PRESENCE OF ULCERS  *URINARY PROBLEMS  *BOWEL PROBLEMS  UNUSUAL RASH Items with * indicate a potential emergency and should be followed up as soon as possible.  Return as scheduled.  I have been informed and understand all the instructions given to me. I know to contact the clinic, my physician, or go to the Emergency Department if any problems should occur. I do not have any questions at this time, but understand that I may call the clinic during office hours or the Patient Navigator at [phone] should I have any questions or need assistance in obtaining follow up care.    __________________________________________  _____________  __________ Signature of Patient or Authorized Representative            Date                   Time    __________________________________________ Nurse's Signature

## 2015-05-08 ENCOUNTER — Encounter (HOSPITAL_BASED_OUTPATIENT_CLINIC_OR_DEPARTMENT_OTHER): Payer: Medicaid Other

## 2015-05-08 VITALS — BP 139/70 | HR 99 | Temp 97.9°F | Resp 16

## 2015-05-08 DIAGNOSIS — C155 Malignant neoplasm of lower third of esophagus: Secondary | ICD-10-CM

## 2015-05-08 MED ORDER — FILGRASTIM 480 MCG/1.6ML IJ SOLN
480.0000 ug | Freq: Once | INTRAMUSCULAR | Status: AC
  Administered 2015-05-08: 480 ug via SUBCUTANEOUS
  Filled 2015-05-08: qty 1.6

## 2015-05-08 NOTE — Progress Notes (Signed)
Patient reports he slept so much from benadryl yesterday that he was awake at 3 am this morning. Denies any other complaints post chemo. Orlene Erm presents today for injection per MD orders. Neupogen 480 mcg administered SQ in left Abdomen. Administration without incident. Patient tolerated well.

## 2015-05-08 NOTE — Patient Instructions (Signed)
[place] at [facility] Discharge Instructions  RECOMMENDATIONS MADE BY THE CONSULTANT AND ANY TEST RESULTS WILL BE SENT TO YOUR REFERRING PHYSICIAN.  Neupogen 480 mcg injection given as ordered.  Thank you for choosing [name] at [facility] to provide your oncology and hematology care.  To afford each patient quality time with our provider, please arrive at least 15 minutes before your scheduled appointment time.   Beginning January 23rd 2017 lab work for the [facility] will be done in the  Main lab at [facility] on 1st floor. If you have a lab appointment with the [facility] please come in thru the  Main Entrance and check in at the main information desk  You need to re-schedule your appointment should you arrive 10 or more minutes late.  We strive to give you quality time with our providers, and arriving late affects you and other patients whose appointments are after yours.  Also, if you no show three or more times for appointments you may be dismissed from the clinic at the providers discretion.     Again, thank you for choosing [facility].  Our hope is that these requests will decrease the amount of time that you wait before being seen by our physicians.       _____________________________________________________________  Should you have questions after your visit to [facility], please contact our office at [phone] between the hours of 8:30 a.m. and 4:30 p.m.  Voicemails left after 4:30 p.m. will not be returned until the following business day.  For prescription refill requests, have your pharmacy contact our office.

## 2015-05-09 ENCOUNTER — Encounter (HOSPITAL_COMMUNITY): Payer: Medicaid Other | Attending: Hematology & Oncology

## 2015-05-09 VITALS — BP 111/75 | HR 106 | Temp 97.8°F | Resp 18

## 2015-05-09 DIAGNOSIS — K229 Disease of esophagus, unspecified: Secondary | ICD-10-CM | POA: Insufficient documentation

## 2015-05-09 DIAGNOSIS — R740 Nonspecific elevation of levels of transaminase and lactic acid dehydrogenase [LDH]: Secondary | ICD-10-CM | POA: Insufficient documentation

## 2015-05-09 DIAGNOSIS — Z5189 Encounter for other specified aftercare: Secondary | ICD-10-CM | POA: Diagnosis not present

## 2015-05-09 DIAGNOSIS — C155 Malignant neoplasm of lower third of esophagus: Secondary | ICD-10-CM

## 2015-05-09 DIAGNOSIS — K7689 Other specified diseases of liver: Secondary | ICD-10-CM | POA: Insufficient documentation

## 2015-05-09 MED ORDER — FILGRASTIM 480 MCG/0.8ML IJ SOSY
480.0000 ug | PREFILLED_SYRINGE | Freq: Once | INTRAMUSCULAR | Status: DC
  Filled 2015-05-09: qty 0.8

## 2015-05-09 MED ORDER — FILGRASTIM 480 MCG/1.6ML IJ SOLN
480.0000 ug | Freq: Once | INTRAMUSCULAR | Status: AC
  Administered 2015-05-09: 480 ug via SUBCUTANEOUS

## 2015-05-09 NOTE — Patient Instructions (Signed)
[place] at [facility] Discharge Instructions  RECOMMENDATIONS MADE BY THE CONSULTANT AND ANY TEST RESULTS WILL BE SENT TO YOUR REFERRING PHYSICIAN.  Neupogen 463mcg today.  Please return tomorrow as scheduled.    Thank you for choosing [place] at [facility] to provide your oncology and hematology care.  To afford each patient quality time with our provider, please arrive at least 15 minutes before your scheduled appointment time.   Beginning January 23rd 2017 lab work for the [facility] will be done in the  Main lab at [facility] on 1st floor. If you have a lab appointment with the [facility] please come in thru the  Main Entrance and check in at the main information desk  You need to re-schedule your appointment should you arrive 10 or more minutes late.  We strive to give you quality time with our providers, and arriving late affects you and other patients whose appointments are after yours.  Also, if you no show three or more times for appointments you may be dismissed from the clinic at the providers discretion.     Again, thank you for choosing [facility].  Our hope is that these requests will decrease the amount of time that you wait before being seen by our physicians.       _____________________________________________________________  Should you have questions after your visit to [facility], please contact our office at [phone] between the hours of 8:30 a.m. and 4:30 p.m.  Voicemails left after 4:30 p.m. will not be returned until the following business day.  For prescription refill requests, have your pharmacy contact our office.

## 2015-05-09 NOTE — Progress Notes (Signed)
Brent Franco presents today for injection per MD orders. Neupogen 421mcg administered SQ in right Abdomen. Administration without incident. Patient tolerated well.

## 2015-05-10 ENCOUNTER — Encounter (HOSPITAL_COMMUNITY): Payer: Self-pay | Admitting: *Deleted

## 2015-05-10 ENCOUNTER — Encounter (HOSPITAL_BASED_OUTPATIENT_CLINIC_OR_DEPARTMENT_OTHER): Payer: Medicaid Other

## 2015-05-10 VITALS — BP 107/71 | HR 111 | Temp 97.7°F | Resp 18

## 2015-05-10 DIAGNOSIS — C155 Malignant neoplasm of lower third of esophagus: Secondary | ICD-10-CM

## 2015-05-10 MED ORDER — FILGRASTIM 480 MCG/0.8ML IJ SOSY
480.0000 ug | PREFILLED_SYRINGE | Freq: Once | INTRAMUSCULAR | Status: DC
  Filled 2015-05-10: qty 0.8

## 2015-05-10 MED ORDER — FILGRASTIM 480 MCG/1.6ML IJ SOLN
480.0000 ug | Freq: Once | INTRAMUSCULAR | Status: AC
  Administered 2015-05-10: 480 ug via SUBCUTANEOUS

## 2015-05-10 NOTE — Progress Notes (Signed)
Patient talked with Dr.Penland prior to injection. He and wife concerned about increased HR. MD explained radiation may irritate heart lining and it is something she will continue to monitor and if it continues to be a problem she may start low dose beta blocker. Verbalized understanding.   Brent Franco presents today for injection per MD orders. Neupogen 480 mcg administered SQ in right Abdomen. Administration without incident. Patient tolerated well.

## 2015-05-10 NOTE — Progress Notes (Signed)
Consent obtained for Carboplatin and Taxol.

## 2015-05-10 NOTE — Patient Instructions (Signed)
[place] at [facility] Discharge Instructions  RECOMMENDATIONS MADE BY THE CONSULTANT AND ANY TEST RESULTS WILL BE SENT TO YOUR REFERRING PHYSICIAN.  Neupogen 480 mcg injection given today as ordered. Return as scheduled.  Thank you for choosing [place] at [facility] to provide your oncology and hematology care.  To afford each patient quality time with our provider, please arrive at least 15 minutes before your scheduled appointment time.   Beginning January 23rd 2017 lab work for the [facility] will be done in the  Main lab at [facility] on 1st floor. If you have a lab appointment with the [facility] please come in thru the  Main Entrance and check in at the main information desk  You need to re-schedule your appointment should you arrive 10 or more minutes late.  We strive to give you quality time with our providers, and arriving late affects you and other patients whose appointments are after yours.  Also, if you no show three or more times for appointments you may be dismissed from the clinic at the providers discretion.     Again, thank you for choosing [facility].  Our hope is that these requests will decrease the amount of time that you wait before being seen by our physicians.       _____________________________________________________________  Should you have questions after your visit to [facility], please contact our office at [phone] between the hours of 8:30 a.m. and 4:30 p.m.  Voicemails left after 4:30 p.m. will not be returned until the following business day.  For prescription refill requests, have your pharmacy contact our office.         Resources For Cancer Patients and their Caregivers ? American Cancer Society: Can assist with transportation, wigs, general needs, runs Look Good Feel Better.        2104611366 ? Cancer Care: Provides financial assistance, online  support groups, medication/co-pay assistance.  1-800-813-HOPE 726-803-6212) ? [place] Assists [place] cancer patients and their families through emotional , educational and financial support.  3210732841 ? [place] DSS Where to apply for food stamps, Medicaid and utility assistance. (832)286-2214 ? [facility]: Transportation to medical appointments. 212-830-2838 ? Social Security Administration: May apply for disability if have a Stage IV cancer. 564-587-7344 743-190-6752 ? [employer], Disability and Transit Services: Assists with nutrition, care and transit needs. ([phone] 018-8770

## 2015-05-11 ENCOUNTER — Ambulatory Visit (HOSPITAL_COMMUNITY): Payer: Medicaid Other | Admitting: Oncology

## 2015-05-11 ENCOUNTER — Encounter (HOSPITAL_BASED_OUTPATIENT_CLINIC_OR_DEPARTMENT_OTHER): Payer: Medicaid Other

## 2015-05-11 ENCOUNTER — Inpatient Hospital Stay (HOSPITAL_COMMUNITY): Payer: Medicaid Other

## 2015-05-11 VITALS — BP 114/73 | HR 116 | Temp 97.5°F | Resp 18

## 2015-05-11 DIAGNOSIS — C155 Malignant neoplasm of lower third of esophagus: Secondary | ICD-10-CM

## 2015-05-11 MED ORDER — FILGRASTIM 480 MCG/1.6ML IJ SOLN
480.0000 ug | Freq: Once | INTRAMUSCULAR | Status: AC
  Administered 2015-05-11: 480 ug via SUBCUTANEOUS
  Filled 2015-05-11: qty 1.6

## 2015-05-11 NOTE — Progress Notes (Signed)
..  Brent Franco presents today for injection per the provider's orders.  neupogen administration without incident; see MAR for injection details.  Patient tolerated procedure well and without incident.  No questions or complaints noted at this time.

## 2015-05-13 NOTE — Assessment & Plan Note (Addendum)
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  Began concomitant chemoradiation on 04/30/2015 with weekly Carboplatin/Paclitaxel.  Oncology history developed.  His weight is down 1.7 lbs compared to last week.  He is using boost/ensure to supplement his diet at this time.  Pre-treatment labs today as planned. They're pending at this time. Labs satisfy treatment parameters. ANC is 6.2. Will not pursue Neupogen therapy this week. Last week, he needed Neupogen daily following treatment and reported increased bone pain.  Patient's wife is concerned about his heart rate. I have a rate of 93 today. He is asymptomatic. Not officially tachycardic. We'll follow moving forward.   Patient reports that Friday last week was a low day for him. He is recovered nicely since then. We will get him set up for Thursday of this week for 1 L fluids. He is encouraged to continue drinking. I wonder if his heart rate is secondary to dehydration potentially.  Patient's wife reports that he will finish radiation on March 23.  Return next week for follow-up and cycle #4.

## 2015-05-13 NOTE — Progress Notes (Signed)
[name], NP [address] [place] [address]  Malignant neoplasm of lower third of esophagus ([place]) - Plan: sucralfate (CARAFATE) 1 g tablet, 0.9 %  sodium chloride infusion  CURRENT THERAPY: Concomitant chemoradiation consisting of weekly Carboplatin/Paclitaxel beginning on 04/30/2015.  INTERVAL HISTORY: GIANNO RONDAN 65 y.o. 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    Esophageal cancer ([place])   03/08/2015 Imaging CT chest at [facility]- abnormal adenopathy (hilar and mediastinal)   03/26/2015 Procedure EGD by Dr. Gala Romney with abnormal findings and biopsy   03/26/2015 Pathology Results Esophagus, biopsy - INVASIVE POORLY DIFFERENTIATED CARCINOMA, SEE COMMENT. - EXTENSIVE ULCERATION WITH FUNGAL ORGANISMS.   04/05/2015 PET scan Elongated distal esophageal mass is markedly hypermetabolic. Small mediastinal lymph nodes are not hypermetabolic. Single 10 mm gastrohepatic ligament lymph node which is hypermetabolic. Suspect a right hepatic lobe metastatic lesion.   04/15/2015 - 04/21/2015 Hospital Admission [facility]   04/30/2015 Imaging MRI abd- 1.5 cm hepatic lesion in segment 7, corresponding to the hypermetabolic lesion on CT, suspicious for metastasis. Two upper abdominal lymph nodes measuring up to 1.3 cm short axis, suspicious for nodal metastases.   04/30/2015 -  Chemotherapy Weekly Carboplatin/Paclitaxel with XRT    Radiation Therapy Dr. Isidore Moos   I personally reviewed and went over laboratory results with the patient.  The results are noted within this dictation.Labs satisfy treatment parameters today. ANC is 6.2 and therefore he will not need Neupogen daily this week. He did need Neupogen last week and he reported increased bone  pain as a result.  He knows that he is drinking fluids. He is eating what he can. He is supplementing his diet with boost/ensure. He is being followed by Burtis Junes. His weight is down 1.7 pounds today compared to last week.  He reports that last Friday was a rough day for him secondary to fatigue and tiredness.  He's recovered nicely since then. Given his weight loss, I will recommend IV fluids this Thursday. He is agreeable to this plan.  He otherwise denies any complaints. He'll continue treatment as planned.  Past Medical History  Diagnosis Date  . COPD (chronic obstructive pulmonary disease) ([place])   . ETOH abuse     stopped 10 years ago  . Depression     suicide attempt 20 years ago  . Arthritis   . Asthma   . Viral hepatitis C   . Diabetes mellitus without complication ([place])   . Hypothyroidism   . Cancer ([place])     esophageal  . Pneumonia 02/2015  . Itching     both feet, known has athlet's feet now    has Chest pain; Elevated transaminase level; COPD (chronic obstructive pulmonary disease) ([place]); Depression; History of hepatitis C; Dysphagia; Encounter for screening colonoscopy; Mucosal abnormality of esophagus; History of colonic polyps; Diverticulosis of colon without hemorrhage; Esophageal cancer ([place]); Liver lesion; Sepsis ([place]); Pneumonia; Healthcare-associated pneumonia; and SOB (shortness of breath) on his problem list.     is allergic to demerol.  Current Outpatient Prescriptions on File Prior to Visit  Medication Sig Dispense Refill  . ACCU-CHEK AVIVA PLUS test strip 2 (two) times daily. use for testing  2  . acetaminophen (TYLENOL) 500 MG tablet Take 500 mg by mouth every 6 (  six) hours as needed for fever.    Marland Kitchen albuterol (PROVENTIL HFA;VENTOLIN HFA) 108 (90 BASE) MCG/ACT inhaler Inhale 1 puff into the lungs every 6 (six) hours as needed for wheezing or shortness of breath.    . ALPRAZolam (XANAX) 1 MG tablet Take 1 tablet (1 mg total) by mouth every 8 (eight)  hours as needed for anxiety. 90 tablet 1  . docusate sodium (COLACE) 100 MG capsule Take 100 mg by mouth 2 (two) times daily.  4  . FLUoxetine (PROZAC) 20 MG capsule Take 1 capsule (20 mg total) by mouth daily. 30 capsule 3  . HUMULIN R 100 UNIT/ML injection Inject 2-12 Units into the skin 2 (two) times daily as needed for high blood sugar. 151-200 = 2 units, 201-250 = 4 units, 251-300 = 6 units, 301-350 = 8 units, 351-400 = 10 units, 401-450 = 12 units.  2  . ipratropium-albuterol (DUONEB) 0.5-2.5 (3) MG/3ML SOLN INHALE CONTENTS OF 1 VIAL IN NEBULIZER EVERY 4 HOURS AS NEEDED FOR SHORTNESS OF BREATH.  3  . ketoconazole (NIZORAL) 2 % cream Apply 1 application topically 2 (two) times daily.  6  . levofloxacin (LEVAQUIN) 500 MG tablet Take 1 tablet (500 mg total) by mouth daily. 10 tablet 0  . levothyroxine (SYNTHROID, LEVOTHROID) 25 MCG tablet Take 25 mcg by mouth daily.  5  . lidocaine-prilocaine (EMLA) cream Apply a quarter size amount to port site 1 hour prior to chemo. Do not rub in. Cover with plastic wrap. 30 g 3  . meloxicam (MOBIC) 7.5 MG tablet Reported on 05/07/2015  6  . metFORMIN (GLUCOPHAGE) 500 MG tablet Take 500 mg by mouth 2 (two) times daily.  3  . mometasone (NASONEX) 50 MCG/ACT nasal spray Place 2 sprays into the nose daily as needed (congestion).     . nitroGLYCERIN (NITROSTAT) 0.4 MG SL tablet Place 1 tablet (0.4 mg total) under the tongue every 5 (five) minutes as needed for chest pain. 25 tablet 3  . ondansetron (ZOFRAN) 8 MG tablet Take 1 tablet (8 mg total) by mouth every 8 (eight) hours as needed for nausea or vomiting. 30 tablet 2  . oxyCODONE (OXYCONTIN) 20 mg 12 hr tablet Take 1 tablet (20 mg total) by mouth every 8 (eight) hours. 90 tablet 0  . oxyCODONE-acetaminophen (ROXICET) 5-325 MG tablet Take 1-2 tablets by mouth every 6 (six) hours as needed for moderate pain or severe pain. 60 tablet 0  . predniSONE (DELTASONE) 10 MG tablet Take 1 tablet (10 mg total) by mouth daily  with breakfast. Take 6 tablets today and then decrease by 1 tablet daily until none are left. 21 tablet 0  . prochlorperazine (COMPAZINE) 10 MG tablet Take 1 tablet (10 mg total) by mouth every 6 (six) hours as needed for nausea or vomiting. 30 tablet 2  . Pseudoeph-Doxylamine-DM-APAP (NYQUIL MULTI-SYMPTOM PO) Take 1 tablet by mouth daily as needed (congestion/cold).     . SPIRIVA HANDIHALER 18 MCG inhalation capsule Place 1 puff into inhaler and inhale daily.  5   Current Facility-Administered Medications on File Prior to Visit  Medication Dose Route Frequency Provider Last Rate Last Dose  . CARBOplatin (PARAPLATIN) 270 mg in sodium chloride 0.9 % 100 mL chemo infusion  270 mg Intravenous Once Patrici Ranks, MD      . dexamethasone (DECADRON) 20 mg in sodium chloride 0.9 % 50 mL IVPB  20 mg Intravenous Once Patrici Ranks, MD      . diphenhydrAMINE (BENADRYL) injection  50 mg  50 mg Intravenous Once Patrici Ranks, MD      . famotidine (PEPCID) IVPB 20 mg premix  20 mg Intravenous Once Patrici Ranks, MD      . heparin lock flush 100 unit/mL  500 Units Intracatheter Once PRN Patrici Ranks, MD      . PACLitaxel (TAXOL) 102 mg in dextrose 5 % 250 mL chemo infusion (</= 80mg /m2)  50 mg/m2 (Treatment Plan Actual) Intravenous Once Patrici Ranks, MD      . sodium chloride flush (NS) 0.9 % injection 10 mL  10 mL Intracatheter PRN Patrici Ranks, MD        Past Surgical History  Procedure Laterality Date  . Other surgical history Left 1995    fatty tissue tumor right upper thigh  . Dental surgery N/A January 2016    "dentures and spurs"   . Tumor excision Left 1990    inner thigh  . Colonoscopy with propofol N/A 03/26/2015    Procedure: COLONOSCOPY WITH PROPOFOL;  Surgeon: Daneil Dolin, MD;  [facility];  Service: Endoscopy;  Laterality: N/A;  1100  . Esophagogastroduodenoscopy (egd) with propofol N/A 03/26/2015    Procedure: ESOPHAGOGASTRODUODENOSCOPY (EGD)  WITH PROPOFOL;  Surgeon: Daneil Dolin, MD;  [facility];  Service: Endoscopy;  Laterality: N/A;  . Biopsy  03/26/2015    Procedure: BIOPSY;  Surgeon: Daneil Dolin, MD;  [facility];  Service: Endoscopy;;  esophageal mass  . Polypectomy  03/26/2015    Procedure: POLYPECTOMY;  Surgeon: Daneil Dolin, MD;  [facility];  Service: Endoscopy;;  polypectomy at ileocecal valve    Denies any headaches, dizziness, double vision, fevers, chills, night sweats, , diarrhea, constipation, chest pain, heart palpitations, shortness of breath, blood in stool, black tarry stool, urinary pain, urinary burning, urinary frequency, hematuria.   PHYSICAL EXAMINATION  ECOG PERFORMANCE STATUS: 1 - Symptomatic but completely ambulatory  Filed Vitals:   05/14/15 0928  BP: 109/68  Pulse: 93  Temp: 98 F (36.7 C)  Resp: 18     GENERAL:alert, no distress, comfortable, cooperative, smiling, and in chemotherapy bed, and accompanied by his wife, [name]. SKIN: skin color, texture, turgor are normal, no rashes or significant lesions HEAD: Normocephalic, No masses, lesions, tenderness or abnormalities EYES: normal, Conjunctiva are pink and non-injected EARS: External ears normal OROPHARYNX:lips, buccal mucosa, and tongue normal and mucous membranes are moist  NECK: supple, trachea midline LYMPH:  not examined BREAST:not examined LUNGS: clear to auscultation and percussion HEART: regular rate & rhythm, no murmurs, no gallops and S1 and S2 normal ABDOMEN:abdomen soft, non-tender, normal bowel sounds and no masses or organomegaly BACK: Back symmetric, no curvature. EXTREMITIES:less then 2 second capillary refill, no joint deformities, effusion, or inflammation, no skin discoloration, no cyanosis  NEURO: alert & oriented x 3 with fluent speech, no focal motor/sensory deficits   LABORATORY DATA: CBC    Component Value Date/Time   WBC 8.9 05/14/2015 0646   RBC 3.65* 05/14/2015  0646   HGB 10.9* 05/14/2015 0646   HCT 33.0* 05/14/2015 0646   PLT 125* 05/14/2015 0646   MCV 90.4 05/14/2015 0646   MCH 29.9 05/14/2015 0646   MCHC 33.0 05/14/2015 0646   RDW 15.9* 05/14/2015 0646   LYMPHSABS 1.7 05/14/2015 0646   MONOABS 0.8 05/14/2015 0646   EOSABS 0.1 05/14/2015 0646   BASOSABS 0.2* 05/14/2015 0646      Chemistry  Component Value Date/Time   NA 134* 05/14/2015 0646   K 4.3 05/14/2015 0646   CL 99* 05/14/2015 0646   CO2 29 05/14/2015 0646   BUN 12 05/14/2015 0646   CREATININE 0.68 05/14/2015 0646   CREATININE 0.78 01/18/2015 0951      Component Value Date/Time   CALCIUM 8.2* 05/14/2015 0646   ALKPHOS 91 05/14/2015 0646   AST 51* 05/14/2015 0646   ALT 58 05/14/2015 0646   BILITOT 0.4 05/14/2015 0646        PENDING LABS:   RADIOGRAPHIC STUDIES:  Mr Abdomen W Wo Contrast  04/30/2015  CLINICAL DATA:  Esophageal cancer.  Possible liver lesion on PET. EXAM: MRI ABDOMEN WITHOUT AND WITH CONTRAST TECHNIQUE: Multiplanar multisequence MR imaging of the abdomen was performed both before and after the administration of intravenous contrast. CONTRAST:  55mL MULTIHANCE GADOBENATE DIMEGLUMINE 529 MG/ML IV SOLN COMPARISON:  PET-CT dated 04/05/2015. FINDINGS: Lower chest: 2.0 cm patchy/nodular opacity in the posterior right lower lobe (series 6/image 2), new, likely reflecting atelectasis or possibly infection. Lower esophageal wall thickening, likely corresponding to known primary esophageal neoplasm. Hepatobiliary: 1.5 cm T2 hyperintense lesion in segment 7 (series 27/image 13), with vague enhancement, and corresponding to the hypermetabolic lesion on PET, suspicious for metastasis. Two additional T2 hyperintense lesions measuring up to 5 mm may reflect cysts (series 6/ images 9 and 20). Layering gallstones (series 6/ image 22), without associated inflammatory changes. No intrahepatic or extrahepatic ductal dilatation. Pancreas: Within normal limits. Spleen: Within  normal limits. Adrenals/Urinary Tract: Adrenal glands are within normal limits. Kidneys are within normal limits.  No hydronephrosis. Stomach/Bowel: Stomach is within normal limits. Visualized bowel is unremarkable. Vascular/Lymphatic: No evidence of abdominal aortic aneurysm. Small upper abdominal lymph nodes, including: --13 mm short axis gastrohepatic node (series 6/image 17), suspicious --11 mm short axis node in the porta hepatis (series 6/ image 21), indeterminate Other: No abdominal ascites. Musculoskeletal: No focal osseous lesions. IMPRESSION: Lower esophageal wall thickening, likely corresponding to known primary esophageal neoplasm. 1.5 cm hepatic lesion in segment 7, corresponding to the hypermetabolic lesion on CT, suspicious for metastasis. Two upper abdominal lymph nodes measuring up to 1.3 cm short axis, suspicious for nodal metastases. Cholelithiasis. Electronically Signed   By: Julian Hy M.D.   On: 04/30/2015 17:58   Dg Chest Port 1 View  04/17/2015  CLINICAL DATA:  Fever and confusion with shortness of Breath EXAM: PORTABLE CHEST 1 VIEW COMPARISON:  04/15/2015 FINDINGS: Cardiac shadow is stable. Right chest wall port is again seen and stable. Name patchy changes are noted bilaterally predominately in a perihilar distribution suggestive of CHF. This may however represent multi focus pneumonias. Continued followup is recommended. IMPRESSION: New multifocal patchy infiltrates bilaterally as described. Electronically Signed   By: Inez Catalina M.D.   On: 04/17/2015 16:17   Dg Chest Portable 1 View  04/15/2015  CLINICAL DATA:  Cough and fever.  Esophageal cancer EXAM: PORTABLE CHEST 1 VIEW COMPARISON:  03/23/2015 FINDINGS: Progression of left lower lobe infiltrate may represent recurrent pneumonia. No significant effusion. Right lung clear. Port-A-Cath tip in the SVC. Negative for heart failure. IMPRESSION: Progression of left lower lobe infiltrate since prior study which may represent  recurrent pneumonia. Electronically Signed   By: Franchot Gallo M.D.   On: 04/15/2015 16:32   Dg Abd 2 Views  04/20/2015  CLINICAL DATA:  Esophageal cancer, abdominal pain for couple weeks, COPD, ethanol abuse, diabetes mellitus, hepatitis, former smoker EXAM: ABDOMEN - 2 VIEW COMPARISON:  03/06/2015  FINDINGS: Lung bases grossly clear. Nonobstructive bowel gas pattern. No bowel dilatation or bowel wall thickening. Prominent stool in rectum. Few sigmoid diverticula noted. No free intraperitoneal air. Osseous structures unremarkable. No urinary tract calcifications. IMPRESSION: Mildly prominent stool in rectum with note of mild sigmoid diverticulosis. Otherwise normal exam. Electronically Signed   By: Lavonia Dana M.D.   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  Began concomitant chemoradiation on 04/30/2015 with weekly Carboplatin/Paclitaxel.  Oncology history developed.  His weight is down 1.7 lbs compared to last week.  He is using boost/ensure to supplement his diet at this time.  Pre-treatment labs today as planned. They're pending at this time. Labs satisfy treatment parameters. ANC is 6.2. Will not pursue Neupogen therapy this week. Last week, he needed Neupogen daily following treatment and reported increased bone pain.  Patient's wife is concerned about his heart rate. I have a rate of 93 today. He is asymptomatic. Not officially tachycardic. We'll follow moving forward.   Patient reports that Friday last week was a low day for him. He is recovered nicely since then. We will get him set up for Thursday of this week for 1 L fluids. He is encouraged to continue drinking. I  wonder if his heart rate is secondary to dehydration potentially.  Patient's wife reports that he will finish radiation on March 23.  Return next week for follow-up and cycle #4.    THERAPY PLAN:  Continue concomitant XRT and chemotherapy as planned.  All questions were answered. The patient knows to call the clinic with any problems, questions or concerns. We can certainly see the patient much sooner if necessary.  Patient and plan discussed with Dr. Ancil Linsey and she is in agreement with the aforementioned.   This note is electronically signed by: Doy Mince 05/14/2015 10:43 AM

## 2015-05-14 ENCOUNTER — Encounter (HOSPITAL_BASED_OUTPATIENT_CLINIC_OR_DEPARTMENT_OTHER): Payer: Medicaid Other | Admitting: Oncology

## 2015-05-14 ENCOUNTER — Ambulatory Visit (HOSPITAL_COMMUNITY): Payer: Medicaid Other | Admitting: Hematology & Oncology

## 2015-05-14 ENCOUNTER — Encounter (HOSPITAL_BASED_OUTPATIENT_CLINIC_OR_DEPARTMENT_OTHER): Payer: Medicaid Other

## 2015-05-14 ENCOUNTER — Encounter (HOSPITAL_COMMUNITY): Payer: Self-pay | Admitting: Oncology

## 2015-05-14 ENCOUNTER — Inpatient Hospital Stay (HOSPITAL_COMMUNITY): Payer: Medicaid Other

## 2015-05-14 VITALS — BP 109/68 | HR 93 | Temp 98.0°F | Resp 18 | Wt 176.0 lb

## 2015-05-14 VITALS — BP 142/79 | HR 104 | Temp 97.4°F | Resp 18

## 2015-05-14 DIAGNOSIS — Z5111 Encounter for antineoplastic chemotherapy: Secondary | ICD-10-CM

## 2015-05-14 DIAGNOSIS — K229 Disease of esophagus, unspecified: Secondary | ICD-10-CM | POA: Diagnosis not present

## 2015-05-14 DIAGNOSIS — K7689 Other specified diseases of liver: Secondary | ICD-10-CM | POA: Diagnosis not present

## 2015-05-14 DIAGNOSIS — R740 Nonspecific elevation of levels of transaminase and lactic acid dehydrogenase [LDH]: Secondary | ICD-10-CM | POA: Diagnosis not present

## 2015-05-14 DIAGNOSIS — C155 Malignant neoplasm of lower third of esophagus: Secondary | ICD-10-CM | POA: Diagnosis present

## 2015-05-14 LAB — COMPREHENSIVE METABOLIC PANEL
ALT: 58 U/L (ref 17–63)
AST: 51 U/L — AB (ref 15–41)
Albumin: 3.3 g/dL — ABNORMAL LOW (ref 3.5–5.0)
Alkaline Phosphatase: 91 U/L (ref 38–126)
Anion gap: 6 (ref 5–15)
BILIRUBIN TOTAL: 0.4 mg/dL (ref 0.3–1.2)
BUN: 12 mg/dL (ref 6–20)
CHLORIDE: 99 mmol/L — AB (ref 101–111)
CO2: 29 mmol/L (ref 22–32)
CREATININE: 0.68 mg/dL (ref 0.61–1.24)
Calcium: 8.2 mg/dL — ABNORMAL LOW (ref 8.9–10.3)
GFR calc Af Amer: >60 mL/min (ref >60)
Glucose, Bld: 172 mg/dL — ABNORMAL HIGH (ref 65–99)
Potassium: 4.3 mmol/L (ref 3.5–5.1)
Sodium: 134 mmol/L — ABNORMAL LOW (ref 135–145)
Total Protein: 6.5 g/dL (ref 6.5–8.1)

## 2015-05-14 LAB — CBC WITH DIFFERENTIAL/PLATELET
Basophils Absolute: 0.2 10*3/uL — ABNORMAL HIGH (ref 0.0–0.1)
Basophils Relative: 2 %
Eosinophils Absolute: 0.1 10*3/uL (ref 0.0–0.7)
Eosinophils Relative: 1 %
HEMATOCRIT: 33 % — AB (ref 39.0–52.0)
Hemoglobin: 10.9 g/dL — ABNORMAL LOW (ref 13.0–17.0)
LYMPHS ABS: 1.7 10*3/uL (ref 0.7–4.0)
LYMPHS PCT: 19 %
MCH: 29.9 pg (ref 26.0–34.0)
MCHC: 33 g/dL (ref 30.0–36.0)
MCV: 90.4 fL (ref 78.0–100.0)
Monocytes Absolute: 0.8 10*3/uL (ref 0.1–1.0)
Monocytes Relative: 9 %
NEUTROS PCT: 70 %
Neutro Abs: 6.2 10*3/uL (ref 1.7–7.7)
Platelets: 125 10*3/uL — ABNORMAL LOW (ref 150–400)
RBC: 3.65 MIL/uL — AB (ref 4.22–5.81)
RDW: 15.9 % — ABNORMAL HIGH (ref 11.5–15.5)
WBC: 8.9 10*3/uL (ref 4.0–10.5)

## 2015-05-14 MED ORDER — FAMOTIDINE IN NACL 20-0.9 MG/50ML-% IV SOLN
20.0000 mg | Freq: Once | INTRAVENOUS | Status: AC
  Administered 2015-05-14: 20 mg via INTRAVENOUS

## 2015-05-14 MED ORDER — PALONOSETRON HCL INJECTION 0.25 MG/5ML
INTRAVENOUS | Status: AC
  Filled 2015-05-14: qty 5

## 2015-05-14 MED ORDER — DIPHENHYDRAMINE HCL 50 MG/ML IJ SOLN
INTRAMUSCULAR | Status: AC
  Filled 2015-05-14: qty 1

## 2015-05-14 MED ORDER — HEPARIN SOD (PORK) LOCK FLUSH 100 UNIT/ML IV SOLN
500.0000 [IU] | Freq: Once | INTRAVENOUS | Status: AC | PRN
  Administered 2015-05-14: 500 [IU]

## 2015-05-14 MED ORDER — PALONOSETRON HCL INJECTION 0.25 MG/5ML
0.2500 mg | Freq: Once | INTRAVENOUS | Status: AC
Start: 2015-05-14 — End: 2015-05-14
  Administered 2015-05-14: 0.25 mg via INTRAVENOUS

## 2015-05-14 MED ORDER — SODIUM CHLORIDE 0.9% FLUSH: 10.0000 mL | INTRAVENOUS | Status: DC | PRN

## 2015-05-14 MED ORDER — SODIUM CHLORIDE 0.9 % IV SOLN
260.0000 mg | Freq: Once | INTRAVENOUS | Status: AC
  Administered 2015-05-14: 260 mg via INTRAVENOUS
  Filled 2015-05-14: qty 26

## 2015-05-14 MED ORDER — SODIUM CHLORIDE 0.9 % IV SOLN
Freq: Once | INTRAVENOUS | Status: AC
  Administered 2015-05-14: 11:00:00 via INTRAVENOUS

## 2015-05-14 MED ORDER — DIPHENHYDRAMINE HCL 50 MG/ML IJ SOLN
50.0000 mg | Freq: Once | INTRAMUSCULAR | Status: AC
  Administered 2015-05-14: 50 mg via INTRAVENOUS

## 2015-05-14 MED ORDER — SODIUM CHLORIDE 0.9 % IV SOLN
20.0000 mg | Freq: Once | INTRAVENOUS | Status: AC
  Administered 2015-05-14: 20 mg via INTRAVENOUS
  Filled 2015-05-14: qty 2

## 2015-05-14 MED ORDER — FAMOTIDINE IN NACL 20-0.9 MG/50ML-% IV SOLN
INTRAVENOUS | Status: AC
  Filled 2015-05-14: qty 50

## 2015-05-14 MED ORDER — DEXTROSE 5 % IV SOLN
50.0000 mg/m2 | Freq: Once | INTRAVENOUS | Status: AC
  Administered 2015-05-14: 102 mg via INTRAVENOUS
  Filled 2015-05-14: qty 17

## 2015-05-14 NOTE — Patient Instructions (Addendum)
[place] at [facility] Discharge Instructions  RECOMMENDATIONS MADE BY THE CONSULTANT AND ANY TEST RESULTS WILL BE SENT TO YOUR REFERRING PHYSICIAN.  Exam and discussion today with Kirby Crigler, PA. We will give you one liter of IV fluids on Thursday. Depending on your lab work today, we may give you Neupogen injections this week. Office visit and chemotherapy next week as scheduled.  Thank you for choosing [place] at [facility] to provide your oncology and hematology care.  To afford each patient quality time with our provider, please arrive at least 15 minutes before your scheduled appointment time.   Beginning January 23rd 2017 lab work for the [facility] will be done in the  Main lab at [facility] on 1st floor. If you have a lab appointment with the [facility] please come in thru the  Main Entrance and check in at the main information desk  You need to re-schedule your appointment should you arrive 10 or more minutes late.  We strive to give you quality time with our providers, and arriving late affects you and other patients whose appointments are after yours.  Also, if you no show three or more times for appointments you may be dismissed from the clinic at the providers discretion.     Again, thank you for choosing [facility].  Our hope is that these requests will decrease the amount of time that you wait before being seen by our physicians.       _____________________________________________________________  Should you have questions after your visit to [facility], please contact our office at [phone] between the hours of 8:30 a.m. and 4:30 p.m.  Voicemails left after 4:30 p.m. will not be returned until the following business day.  For prescription refill requests, have your pharmacy contact our office.         Resources For Cancer Patients and their Caregivers ? American Cancer  Society: Can assist with transportation, wigs, general needs, runs Look Good Feel Better.        551-077-5191 ? Cancer Care: Provides financial assistance, online support groups, medication/co-pay assistance.  1-800-813-HOPE 760-604-0814) ? [place] Assists [place] cancer patients and their families through emotional , educational and financial support.  385-209-5607 ? [place] DSS Where to apply for food stamps, Medicaid and utility assistance. (713)294-4624 ? [employer]: Transportation to medical appointments. (613)757-2191 ? Social Security Administration: May apply for disability if have a Stage IV cancer. (667) 587-4863 (201)482-5432 ? [employer], Disability and Transit Services: Assists with nutrition, care and transit needs. [phone]

## 2015-05-14 NOTE — Patient Instructions (Signed)
[facility] Discharge Instructions for Patients Receiving Chemotherapy   Beginning January 23rd 2017 lab work for the [facility] will be done in the  Main lab at [facility] on 1st floor. If you have a lab appointment with the [place] please come in thru the  Main Entrance and check in at the main information desk   Today you received the following chemotherapy agents: Taxol and Carboplatin.   We have you scheduled to return Thursday for fluids if you need them, but if you are feeling well and don't need them, just call and cancel the appointment.     If you develop nausea and vomiting, or diarrhea that is not controlled by your medication, call the clinic.  The clinic phone number is [phone]. Office hours are Monday-Friday 8:30am-5:00pm.  BELOW ARE SYMPTOMS THAT SHOULD BE REPORTED IMMEDIATELY:  *FEVER GREATER THAN 101.0 F  *CHILLS WITH OR WITHOUT FEVER  NAUSEA AND VOMITING THAT IS NOT CONTROLLED WITH YOUR NAUSEA MEDICATION  *UNUSUAL SHORTNESS OF BREATH  *UNUSUAL BRUISING OR BLEEDING  TENDERNESS IN MOUTH AND THROAT WITH OR WITHOUT PRESENCE OF ULCERS  *URINARY PROBLEMS  *BOWEL PROBLEMS  UNUSUAL RASH Items with * indicate a potential emergency and should be followed up as soon as possible. If you have an emergency after office hours please contact your primary care physician or go to the nearest emergency department.  Please call the clinic during office hours if you have any questions or concerns.   You may also contact the Patient Navigator at [phone] should you have any questions or need assistance in obtaining follow up care.      Resources For Cancer Patients and their Caregivers ? American Cancer Society: Can assist with transportation, wigs, general needs, runs Look Good Feel Better.        317-406-9844 ? Cancer Care: Provides financial assistance, online support groups, medication/co-pay assistance.  1-800-813-HOPE  (717) 285-0240) ? [place] Assists [place] cancer patients and their families through emotional , educational and financial support.  (913)877-5526 ? [place] DSS Where to apply for food stamps, Medicaid and utility assistance. 917 732 8299 ? RCATS: Transportation to medical appointments. 702-887-0174 ? Social Security Administration: May apply for disability if have a Stage IV cancer. 407 501 4433 (807)100-1764 ? [employer], Disability and Transit Services: Assists with nutrition, care and transit needs. 229-181-4021

## 2015-05-14 NOTE — Progress Notes (Signed)
Patient tolerated infusion well.  VSS.   

## 2015-05-15 ENCOUNTER — Ambulatory Visit (HOSPITAL_COMMUNITY): Payer: Medicaid Other | Admitting: Hematology & Oncology

## 2015-05-17 ENCOUNTER — Encounter (HOSPITAL_BASED_OUTPATIENT_CLINIC_OR_DEPARTMENT_OTHER): Payer: Medicaid Other

## 2015-05-17 VITALS — BP 117/74 | HR 105 | Temp 97.5°F | Resp 16

## 2015-05-17 DIAGNOSIS — C155 Malignant neoplasm of lower third of esophagus: Secondary | ICD-10-CM | POA: Diagnosis not present

## 2015-05-17 MED ORDER — SODIUM CHLORIDE 0.9% FLUSH
10.0000 mL | INTRAVENOUS | Status: DC | PRN
  Administered 2015-05-17: 10 mL via INTRAVENOUS
  Filled 2015-05-17: qty 10

## 2015-05-17 MED ORDER — SODIUM CHLORIDE 0.9 % IV SOLN
INTRAVENOUS | Status: DC
  Administered 2015-05-17: 11:00:00 via INTRAVENOUS

## 2015-05-17 MED ORDER — HEPARIN SOD (PORK) LOCK FLUSH 100 UNIT/ML IV SOLN
500.0000 [IU] | Freq: Once | INTRAVENOUS | Status: AC
  Administered 2015-05-17: 500 [IU] via INTRAVENOUS

## 2015-05-17 MED ORDER — HEPARIN SOD (PORK) LOCK FLUSH 100 UNIT/ML IV SOLN
INTRAVENOUS | Status: AC
  Filled 2015-05-17: qty 5

## 2015-05-17 NOTE — Patient Instructions (Signed)
[place] at [facility] Discharge Instructions  RECOMMENDATIONS MADE BY THE CONSULTANT AND ANY TEST RESULTS WILL BE SENT TO YOUR REFERRING PHYSICIAN.  IV fluids today.  Please call us with any further questions or concerns.    Thank you for choosing [place] at [facility] to provide your oncology and hematology care.  To afford each patient quality time with our provider, please arrive at least 15 minutes before your scheduled appointment time.   Beginning January 23rd 2017 lab work for the [facility] will be done in the  Main lab at [facility] on 1st floor. If you have a lab appointment with the [facility] please come in thru the  Main Entrance and check in at the main information desk  You need to re-schedule your appointment should you arrive 10 or more minutes late.  We strive to give you quality time with our providers, and arriving late affects you and other patients whose appointments are after yours.  Also, if you no show three or more times for appointments you may be dismissed from the clinic at the providers discretion.     Again, thank you for choosing [facility].  Our hope is that these requests will decrease the amount of time that you wait before being seen by our physicians.       _____________________________________________________________  Should you have questions after your visit to [facility], please contact our office at [phone] between the hours of 8:30 a.m. and 4:30 p.m.  Voicemails left after 4:30 p.m. will not be returned until the following business day.  For prescription refill requests, have your pharmacy contact our office.         Resources For Cancer Patients and their Caregivers ? American Cancer Society: Can assist with transportation, wigs, general needs, runs Look Good Feel Better.        3360813363 ? Cancer Care: Provides financial assistance, online  support groups, medication/co-pay assistance.  1-800-813-HOPE 601-098-2337) ? [facility] Assists [place] cancer patients and their families through emotional , educational and financial support.  904-796-5275 ? [place] DSS Where to apply for food stamps, Medicaid and utility assistance. 570-767-1338 ? [facility]: Transportation to medical appointments. 209-089-6164 ? Social Security Administration: May apply for disability if have a Stage IV cancer. 9010266456 8591101549 ? [employer], Disability and Transit Services: Assists with nutrition, care and transit needs. 617-160-6659

## 2015-05-17 NOTE — Progress Notes (Signed)
Patient tolerated fluids well.  VSS.  No n/v, or other side effects other than fatigue.

## 2015-05-18 ENCOUNTER — Ambulatory Visit (HOSPITAL_COMMUNITY): Payer: Medicaid Other | Admitting: Hematology & Oncology

## 2015-05-18 ENCOUNTER — Inpatient Hospital Stay (HOSPITAL_COMMUNITY): Payer: Medicaid Other

## 2015-05-21 ENCOUNTER — Ambulatory Visit (HOSPITAL_COMMUNITY): Payer: Medicaid Other | Admitting: Hematology & Oncology

## 2015-05-21 ENCOUNTER — Encounter (HOSPITAL_BASED_OUTPATIENT_CLINIC_OR_DEPARTMENT_OTHER): Payer: Medicaid Other

## 2015-05-21 ENCOUNTER — Encounter (HOSPITAL_BASED_OUTPATIENT_CLINIC_OR_DEPARTMENT_OTHER): Payer: Medicaid Other | Admitting: Hematology & Oncology

## 2015-05-21 ENCOUNTER — Inpatient Hospital Stay (HOSPITAL_COMMUNITY): Payer: Medicaid Other

## 2015-05-21 ENCOUNTER — Encounter (HOSPITAL_COMMUNITY): Payer: Self-pay | Admitting: Hematology & Oncology

## 2015-05-21 VITALS — BP 106/73 | HR 107 | Temp 97.7°F | Resp 18 | Wt 168.0 lb

## 2015-05-21 DIAGNOSIS — F32A Depression, unspecified: Secondary | ICD-10-CM

## 2015-05-21 DIAGNOSIS — R000 Tachycardia, unspecified: Secondary | ICD-10-CM | POA: Diagnosis not present

## 2015-05-21 DIAGNOSIS — F329 Major depressive disorder, single episode, unspecified: Secondary | ICD-10-CM

## 2015-05-21 DIAGNOSIS — G893 Neoplasm related pain (acute) (chronic): Secondary | ICD-10-CM

## 2015-05-21 DIAGNOSIS — Z5111 Encounter for antineoplastic chemotherapy: Secondary | ICD-10-CM

## 2015-05-21 DIAGNOSIS — K769 Liver disease, unspecified: Secondary | ICD-10-CM

## 2015-05-21 DIAGNOSIS — K7689 Other specified diseases of liver: Secondary | ICD-10-CM

## 2015-05-21 DIAGNOSIS — K2289 Other specified disease of esophagus: Secondary | ICD-10-CM

## 2015-05-21 DIAGNOSIS — K228 Other specified diseases of esophagus: Secondary | ICD-10-CM

## 2015-05-21 DIAGNOSIS — T451X5A Adverse effect of antineoplastic and immunosuppressive drugs, initial encounter: Secondary | ICD-10-CM

## 2015-05-21 DIAGNOSIS — C155 Malignant neoplasm of lower third of esophagus: Secondary | ICD-10-CM

## 2015-05-21 DIAGNOSIS — R7401 Elevation of levels of liver transaminase levels: Secondary | ICD-10-CM

## 2015-05-21 DIAGNOSIS — K229 Disease of esophagus, unspecified: Secondary | ICD-10-CM | POA: Diagnosis not present

## 2015-05-21 DIAGNOSIS — D6481 Anemia due to antineoplastic chemotherapy: Secondary | ICD-10-CM

## 2015-05-21 DIAGNOSIS — R740 Nonspecific elevation of levels of transaminase and lactic acid dehydrogenase [LDH]: Secondary | ICD-10-CM

## 2015-05-21 DIAGNOSIS — B182 Chronic viral hepatitis C: Secondary | ICD-10-CM

## 2015-05-21 LAB — COMPREHENSIVE METABOLIC PANEL
ALBUMIN: 3.2 g/dL — AB (ref 3.5–5.0)
ALT: 79 U/L — ABNORMAL HIGH (ref 17–63)
ANION GAP: 7 (ref 5–15)
AST: 65 U/L — AB (ref 15–41)
Alkaline Phosphatase: 70 U/L (ref 38–126)
BUN: 13 mg/dL (ref 6–20)
CO2: 25 mmol/L (ref 22–32)
Calcium: 8.6 mg/dL — ABNORMAL LOW (ref 8.9–10.3)
Chloride: 101 mmol/L (ref 101–111)
Creatinine, Ser: 0.74 mg/dL (ref 0.61–1.24)
GFR calc Af Amer: >60 mL/min (ref >60)
GFR calc non Af Amer: >60 mL/min (ref >60)
GLUCOSE: 229 mg/dL — AB (ref 65–99)
POTASSIUM: 4.1 mmol/L (ref 3.5–5.1)
SODIUM: 133 mmol/L — AB (ref 135–145)
Total Bilirubin: 0.5 mg/dL (ref 0.3–1.2)
Total Protein: 6.7 g/dL (ref 6.5–8.1)

## 2015-05-21 LAB — CBC WITH DIFFERENTIAL/PLATELET
BASOS ABS: 0 10*3/uL (ref 0.0–0.1)
Basophils Relative: 1 %
Eosinophils Absolute: 0 10*3/uL (ref 0.0–0.7)
Eosinophils Relative: 1 %
HCT: 31.6 % — ABNORMAL LOW (ref 39.0–52.0)
HEMOGLOBIN: 10.6 g/dL — AB (ref 13.0–17.0)
LYMPHS PCT: 10 %
Lymphs Abs: 0.2 10*3/uL — ABNORMAL LOW (ref 0.7–4.0)
MCH: 29.7 pg (ref 26.0–34.0)
MCHC: 33.5 g/dL (ref 30.0–36.0)
MCV: 88.5 fL (ref 78.0–100.0)
MONO ABS: 0.6 10*3/uL (ref 0.1–1.0)
MONOS PCT: 24 %
NEUTROS ABS: 1.5 10*3/uL — AB (ref 1.7–7.7)
NEUTROS PCT: 64 %
Platelets: 227 10*3/uL (ref 150–400)
RBC: 3.57 MIL/uL — ABNORMAL LOW (ref 4.22–5.81)
RDW: 15.4 % (ref 11.5–15.5)
WBC: 2.3 10*3/uL — ABNORMAL LOW (ref 4.0–10.5)

## 2015-05-21 MED ORDER — HEPARIN SOD (PORK) LOCK FLUSH 100 UNIT/ML IV SOLN
500.0000 [IU] | Freq: Once | INTRAVENOUS | Status: AC | PRN
  Administered 2015-05-21: 500 [IU]
  Filled 2015-05-21: qty 5

## 2015-05-21 MED ORDER — OXYCODONE-ACETAMINOPHEN 5-325 MG PO TABS: 1.0000 | ORAL_TABLET | Freq: Four times a day (QID) | ORAL | Status: DC | PRN

## 2015-05-21 MED ORDER — SODIUM CHLORIDE 0.9 % IV SOLN
20.0000 mg | Freq: Once | INTRAVENOUS | Status: AC
  Administered 2015-05-21: 20 mg via INTRAVENOUS
  Filled 2015-05-21: qty 2

## 2015-05-21 MED ORDER — PALONOSETRON HCL INJECTION 0.25 MG/5ML
0.2500 mg | Freq: Once | INTRAVENOUS | Status: AC
Start: 2015-05-21 — End: 2015-05-21
  Administered 2015-05-21: 0.25 mg via INTRAVENOUS
  Filled 2015-05-21: qty 5

## 2015-05-21 MED ORDER — ALPRAZOLAM 1 MG PO TABS: 1.0000 mg | ORAL_TABLET | Freq: Three times a day (TID) | ORAL | Status: DC | PRN

## 2015-05-21 MED ORDER — PACLITAXEL CHEMO INJECTION 300 MG/50ML
50.0000 mg/m2 | Freq: Once | INTRAVENOUS | Status: AC
  Administered 2015-05-21: 102 mg via INTRAVENOUS
  Filled 2015-05-21: qty 17

## 2015-05-21 MED ORDER — DIPHENHYDRAMINE HCL 50 MG/ML IJ SOLN
50.0000 mg | Freq: Once | INTRAMUSCULAR | Status: AC
  Administered 2015-05-21: 50 mg via INTRAVENOUS
  Filled 2015-05-21: qty 1

## 2015-05-21 MED ORDER — OXYCODONE HCL ER 20 MG PO T12A: 20.0000 mg | EXTENDED_RELEASE_TABLET | Freq: Three times a day (TID) | ORAL | Status: DC

## 2015-05-21 MED ORDER — FAMOTIDINE IN NACL 20-0.9 MG/50ML-% IV SOLN
20.0000 mg | Freq: Once | INTRAVENOUS | Status: AC
  Administered 2015-05-21: 20 mg via INTRAVENOUS
  Filled 2015-05-21: qty 50

## 2015-05-21 MED ORDER — SODIUM CHLORIDE 0.9 % IV SOLN
Freq: Once | INTRAVENOUS | Status: AC
  Administered 2015-05-21: 11:00:00 via INTRAVENOUS

## 2015-05-21 MED ORDER — SODIUM CHLORIDE 0.9 % IV SOLN
265.8000 mg | Freq: Once | INTRAVENOUS | Status: AC
  Administered 2015-05-21: 270 mg via INTRAVENOUS
  Filled 2015-05-21: qty 27

## 2015-05-21 MED ORDER — SODIUM CHLORIDE 0.9% FLUSH
10.0000 mL | INTRAVENOUS | Status: DC | PRN
  Administered 2015-05-21: 10 mL
  Filled 2015-05-21: qty 10

## 2015-05-21 NOTE — Progress Notes (Signed)
[place] at [place] Note  Patient Care Team: [name], NP as PCP - General (Nurse Practitioner) Daneil Dolin, MD as Consulting Physician (Gastroenterology)  CHIEF COMPLAINTS:  EGD 03/26/2015 with bulky esophageal tumor involving the distal esophagus encroaching significantly upon the lumen. Lesion begins at 35cm from the incisors and extends down to 39 cm approximately 1 cm above the GE junction. No varices were noted, no portal gastropathy noted.  Pathology with invasive poorly differentiated carcinoma, extensive ulceration with fungal organisms (c/w candida)    Esophageal cancer ([place])   03/08/2015 Imaging CT chest at [facility]- abnormal adenopathy (hilar and mediastinal)   03/26/2015 Procedure EGD by Dr. Gala Romney with abnormal findings and biopsy   03/26/2015 Pathology Results Esophagus, biopsy - INVASIVE POORLY DIFFERENTIATED CARCINOMA, SEE COMMENT. - EXTENSIVE ULCERATION WITH FUNGAL ORGANISMS.   04/05/2015 PET scan Elongated distal esophageal mass is markedly hypermetabolic. Small mediastinal lymph nodes are not hypermetabolic. Single 10 mm gastrohepatic ligament lymph node which is hypermetabolic. Suspect a right hepatic lobe metastatic lesion.   04/15/2015 - 04/21/2015 Hospital Admission [facility]                   HISTORY OF PRESENTING ILLNESS:  DEREC MOZINGO 65 y.o. male is here for follow-up of esophageal carcinoma.   Mr. Grafton was here with wife today. He was in a treatment bed reciveing Cycle #4 of Carboplatin/Paclitaxel. He is going to get Neupogen shots Tuesday, Wednesday and Friday of this week.   His wife was worried because he lost weight and has not been tasting food. However yesterday he was eating good. He asked when his taste will come back. He has lost 8 pounds since his last visit.   His wife also noted that he has had a high pulse. They bought a machine to use at home to test his heart rate and that it has been at 120 if he has been moving  but it is sometimes lower. We discussed putting him on a low dose of a beta blocker if this problem continues. His wife notes they have discussed this at radiation as well. He denies CP or SOB.   He got fluids last week and his wife thought that this helped him.  He will get refills of Oxycontin, Percocet and Xanax today.    MEDICAL HISTORY:  Past Medical History  Diagnosis Date  . COPD (chronic obstructive pulmonary disease) ([place])   . ETOH abuse     stopped 10 years ago  . Depression     suicide attempt 20 years ago  . Arthritis   . Asthma   . Viral hepatitis C   . Diabetes mellitus without complication ([place])   . Hypothyroidism   . Cancer ([place])     esophageal  . Pneumonia 02/2015  . Itching     both feet, known has athlet's feet now    SURGICAL HISTORY: Past Surgical History  Procedure Laterality Date  . Other surgical history Left 1995    fatty tissue tumor right upper thigh  . Dental surgery N/A January 2016    "dentures and spurs"   . Tumor excision Left 1990    inner thigh  . Colonoscopy with propofol N/A 03/26/2015    Procedure: COLONOSCOPY WITH PROPOFOL;  Surgeon: Daneil Dolin, MD;  [facility];  Service: Endoscopy;  Laterality: N/A;  1100  . Esophagogastroduodenoscopy (egd) with propofol N/A 03/26/2015    Procedure: ESOPHAGOGASTRODUODENOSCOPY (EGD) WITH PROPOFOL;  Surgeon: Daneil Dolin, MD;  [facility];  Service: Endoscopy;  Laterality: N/A;  . Biopsy  03/26/2015    Procedure: BIOPSY;  Surgeon: Daneil Dolin, MD;  [facility];  Service: Endoscopy;;  esophageal mass  . Polypectomy  03/26/2015    Procedure: POLYPECTOMY;  Surgeon: Daneil Dolin, MD;  [facility];  Service: Endoscopy;;  polypectomy at ileocecal valve    SOCIAL HISTORY: Social History   Social History  . Marital Status: Married    Spouse Name: N/A  . Number of Children: N/A  . Years of Education: N/A   Occupational History  . Not on file.    Social History Main Topics  . Smoking status: Former Smoker -- 1.50 packs/day for 47 years    Types: Cigarettes    Start date: 12/03/1966    Quit date: 12/08/2013  . Smokeless tobacco: Never Used  . Alcohol Use: No     Comment: Quit 2006: previously drank heavily. Had 1 drink 2-3 years ago (2013-2014)  . Drug Use: No     Comment: Previous IV drug use as teenager, none in 40 years. Last marijuanna use 02/22/15.  Marland Kitchen Sexual Activity: Yes   Other Topics Concern  . Not on file   Social History Narrative   Married 33 years, 2 children, 40 and 77. No grandchildren. He's quit smoking for about almost 16 months. History of IVDA Has had problems in the past with alcohol, but quit many years ago. Occupationally he says he's done just about everything  Concrete work, cooking, Warehouse manager, roofing, cleaned pools; "a lot of everything." From [place], [place]  FAMILY HISTORY: Family History  Problem Relation Age of Onset  . Pancreatic cancer Mother   . Lung cancer Father   . Colon cancer Neg Hx    indicated that his mother is deceased. He indicated that his father is deceased.    Mother and father deceased Mother was around 16, father around 52 Father died of lung cancer; his father was a smoker Mother died of pancreatic cancer 1 brother dead from being shot in the head Older brother hasn't been seen or heard from in 10-12 years  ALLERGIES:  is allergic to demerol.  MEDICATIONS:  Current Outpatient Prescriptions  Medication Sig Dispense Refill  . ACCU-CHEK AVIVA PLUS test strip 2 (two) times daily. use for testing  2  . acetaminophen (TYLENOL) 500 MG tablet Take 500 mg by mouth every 6 (six) hours as needed for fever.    Marland Kitchen albuterol (PROVENTIL HFA;VENTOLIN HFA) 108 (90 BASE) MCG/ACT inhaler Inhale 1 puff into the lungs every 6 (six) hours as needed for wheezing or shortness of breath.    . ALPRAZolam (XANAX) 1 MG tablet Take 1 tablet (1 mg total) by mouth every 8 (eight) hours as  needed for anxiety. 90 tablet 1  . docusate sodium (COLACE) 100 MG capsule Take 100 mg by mouth 2 (two) times daily.  4  . FLUoxetine (PROZAC) 20 MG capsule Take 1 capsule (20 mg total) by mouth daily. 30 capsule 3  . HUMULIN R 100 UNIT/ML injection Inject 2-12 Units into the skin 2 (two) times daily as needed for high blood sugar. 151-200 = 2 units, 201-250 = 4 units, 251-300 = 6 units, 301-350 = 8 units, 351-400 = 10 units, 401-450 = 12 units.  2  . ipratropium-albuterol (DUONEB) 0.5-2.5 (3) MG/3ML SOLN INHALE CONTENTS OF 1 VIAL IN NEBULIZER EVERY 4 HOURS AS NEEDED FOR SHORTNESS  OF BREATH.  3  . ketoconazole (NIZORAL) 2 % cream Apply 1 application topically 2 (two) times daily.  6  . levofloxacin (LEVAQUIN) 500 MG tablet Take 1 tablet (500 mg total) by mouth daily. 10 tablet 0  . levothyroxine (SYNTHROID, LEVOTHROID) 25 MCG tablet Take 25 mcg by mouth daily.  5  . lidocaine-prilocaine (EMLA) cream Apply a quarter size amount to port site 1 hour prior to chemo. Do not rub in. Cover with plastic wrap. 30 g 3  . meloxicam (MOBIC) 7.5 MG tablet Reported on 05/07/2015  6  . metFORMIN (GLUCOPHAGE) 500 MG tablet Take 500 mg by mouth 2 (two) times daily.  3  . mometasone (NASONEX) 50 MCG/ACT nasal spray Place 2 sprays into the nose daily as needed (congestion).     . nitroGLYCERIN (NITROSTAT) 0.4 MG SL tablet Place 1 tablet (0.4 mg total) under the tongue every 5 (five) minutes as needed for chest pain. 25 tablet 3  . ondansetron (ZOFRAN) 8 MG tablet Take 1 tablet (8 mg total) by mouth every 8 (eight) hours as needed for nausea or vomiting. 30 tablet 2  . oxyCODONE (OXYCONTIN) 20 mg 12 hr tablet Take 1 tablet (20 mg total) by mouth every 8 (eight) hours. 90 tablet 0  . oxyCODONE-acetaminophen (ROXICET) 5-325 MG tablet Take 1-2 tablets by mouth every 6 (six) hours as needed for moderate pain or severe pain. 60 tablet 0  . predniSONE (DELTASONE) 10 MG tablet Take 1 tablet (10 mg total) by mouth daily with  breakfast. Take 6 tablets today and then decrease by 1 tablet daily until none are left. 21 tablet 0  . prochlorperazine (COMPAZINE) 10 MG tablet Take 1 tablet (10 mg total) by mouth every 6 (six) hours as needed for nausea or vomiting. 30 tablet 2  . Pseudoeph-Doxylamine-DM-APAP (NYQUIL MULTI-SYMPTOM PO) Take 1 tablet by mouth daily as needed (congestion/cold).     . SPIRIVA HANDIHALER 18 MCG inhalation capsule Place 1 puff into inhaler and inhale daily.  5  . sucralfate (CARAFATE) 1 g tablet Take 1 tablet by mouth 2 (two) times daily.  5   No current facility-administered medications for this visit.   Facility-Administered Medications Ordered in Other Visits  Medication Dose Route Frequency Provider Last Rate Last Dose  . CARBOplatin (PARAPLATIN) 270 mg in sodium chloride 0.9 % 100 mL chemo infusion  270 mg Intravenous Once Patrici Ranks, MD      . heparin lock flush 100 unit/mL  500 Units Intracatheter Once PRN Patrici Ranks, MD      . PACLitaxel (TAXOL) 102 mg in dextrose 5 % 250 mL chemo infusion (</= 52m/m2)  50 mg/m2 (Treatment Plan Actual) Intravenous Once SPatrici Ranks MD      . sodium chloride flush (NS) 0.9 % injection 10 mL  10 mL Intracatheter PRN SPatrici Ranks MD   10 mL at 05/21/15 1104    Review of Systems  Constitutional: Positive for weight loss. Negative for fever, chills and malaise/fatigue.       Says he is not able to taste his food. His wife stated that he has been having a high pulse.  HENT: Negative for congestion, hearing loss, nosebleeds and tinnitus.   Eyes: Negative.  Negative for blurred vision, double vision, pain and discharge.  Respiratory: Negative.  Negative for cough, hemoptysis, sputum production, shortness of breath and wheezing.   Cardiovascular: Negative for palpitations, claudication, leg swelling and PND.  Gastrointestinal: Negative for heartburn, nausea, vomiting, diarrhea,  constipation, blood in stool and melena.  Genitourinary:  Negative.  Negative for dysuria, urgency, frequency and hematuria.  Musculoskeletal: Positive for joint pain. Negative for myalgias and falls.  Skin: Negative.  Negative for itching and rash.  Neurological: Negative.  Negative for dizziness, tingling, tremors, sensory change, speech change, focal weakness, seizures, loss of consciousness, weakness and headaches.  Endo/Heme/Allergies: Negative.  Does not bruise/bleed easily.  Psychiatric/Behavioral: Positive for depression. Negative for suicidal ideas, memory loss and substance abuse. The patient is nervous/anxious and has insomnia.        Only able to sleep one to two hours at a time.  All other systems reviewed and are negative.  14 point ROS was done and is otherwise as detailed above or in HPI   PHYSICAL EXAMINATION: ECOG PERFORMANCE STATUS: 1 - Symptomatic but completely ambulatory  Filed Vitals:   05/21/15 0927  BP: 106/73  Pulse: 107  Temp: 97.7 F (36.5 C)  Resp: 18   Filed Weights   05/21/15 0927  Weight: 168 lb (76.204 kg)    Physical Exam  Constitutional: He is oriented to person, place, and time and well-developed, well-nourished, and in no distress.  Thin. Wears glasses  HENT:  Head: Normocephalic and atraumatic.  Nose: Nose normal.  Mouth/Throat: Oropharynx is clear and moist. He has dentures. No oropharyngeal exudate.  Dentures on top and bottom.  Eyes: Conjunctivae and EOM are normal. Pupils are equal, round, and reactive to light. Right eye exhibits no discharge. Left eye exhibits no discharge. No scleral icterus.  Neck: Normal range of motion. Neck supple. No tracheal deviation present. No thyromegaly present.  Cardiovascular: Normal rate, regular rhythm and normal heart sounds.  Exam reveals no gallop and no friction rub.   No murmur heard. Pulmonary/Chest: Effort normal and breath sounds normal. He has no wheezes. He has no rales.  Abdominal: Soft. Bowel sounds are normal. He exhibits no distension and no  mass. There is no tenderness. There is no rebound and no guarding.  Musculoskeletal: Normal range of motion. He exhibits no edema.  Lymphadenopathy:    He has no cervical adenopathy.  Neurological: He is alert and oriented to person, place, and time. He has normal reflexes. No cranial nerve deficit. Gait normal. Coordination normal.  Skin: Skin is warm and dry. No rash noted.  Psychiatric: Mood, memory, affect and judgment normal.  Nursing note and vitals reviewed.   LABORATORY DATA:  I have reviewed the data as listed Results for Brent Franco, GENTHER (MRN [id number]) as of 05/21/2015 11:29  Ref. Range 05/21/2015 09:43  Sodium Latest Ref Range: 135-145 mmol/L 133 (L)  Potassium Latest Ref Range: 3.5-5.1 mmol/L 4.1  Chloride Latest Ref Range: 101-111 mmol/L 101  CO2 Latest Ref Range: 22-32 mmol/L 25  BUN Latest Ref Range: 6-20 mg/dL 13  Creatinine Latest Ref Range: 0.61-1.24 mg/dL 0.74  Calcium Latest Ref Range: 8.9-10.3 mg/dL 8.6 (L)  EGFR (Non-African Amer.) Latest Ref Range: >60 mL/min >60  EGFR (African American) Latest Ref Range: >60 mL/min >60  Glucose Latest Ref Range: 65-99 mg/dL 229 (H)  Anion gap Latest Ref Range: 5-15  7  Alkaline Phosphatase Latest Ref Range: 38-126 U/L 70  Albumin Latest Ref Range: 3.5-5.0 g/dL 3.2 (L)  AST Latest Ref Range: 15-41 U/L 65 (H)  ALT Latest Ref Range: 17-63 U/L 79 (H)  Total Protein Latest Ref Range: 6.5-8.1 g/dL 6.7  Total Bilirubin Latest Ref Range: 0.3-1.2 mg/dL 0.5  WBC Latest Ref Range: 4.0-10.5 K/uL 2.3 (L)  RBC  Latest Ref Range: 4.22-5.81 MIL/uL 3.57 (L)  Hemoglobin Latest Ref Range: 13.0-17.0 g/dL 10.6 (L)  HCT Latest Ref Range: 39.0-52.0 % 31.6 (L)  MCV Latest Ref Range: 78.0-100.0 fL 88.5  MCH Latest Ref Range: 26.0-34.0 pg 29.7  MCHC Latest Ref Range: 30.0-36.0 g/dL 33.5  RDW Latest Ref Range: 11.5-15.5 % 15.4  Platelets Latest Ref Range: 150-400 K/uL 227  Neutrophils Latest Units: % 64  Lymphocytes Latest Units: % 10  Monocytes  Relative Latest Units: % 24  Eosinophil Latest Units: % 1  Basophil Latest Units: % 1  NEUT# Latest Ref Range: 1.7-7.7 K/uL 1.5 (L)  Lymphocyte # Latest Ref Range: 0.7-4.0 K/uL 0.2 (L)  Monocyte # Latest Ref Range: 0.1-1.0 K/uL 0.6  Eosinophils Absolute Latest Ref Range: 0.0-0.7 K/uL 0.0  Basophils Absolute Latest Ref Range: 0.0-0.1 K/uL 0.0     PATHOLOGY:       RADIOGRAPHIC STUDIES: I have personally reviewed the radiological images as listed and agreed with the findings in the report. Study Result     CLINICAL DATA: Esophageal cancer. Possible liver lesion on PET.  EXAM: MRI ABDOMEN WITHOUT AND WITH CONTRAST  TECHNIQUE: Multiplanar multisequence MR imaging of the abdomen was performed both before and after the administration of intravenous contrast.  CONTRAST: 32m MULTIHANCE GADOBENATE DIMEGLUMINE 529 MG/ML IV SOLN  COMPARISON: PET-CT dated 04/05/2015.  FINDINGS: Lower chest: 2.0 cm patchy/nodular opacity in the posterior right lower lobe (series 6/image 2), new, likely reflecting atelectasis or possibly infection.  Lower esophageal wall thickening, likely corresponding to known primary esophageal neoplasm.  Hepatobiliary: 1.5 cm T2 hyperintense lesion in segment 7 (series 27/image 13), with vague enhancement, and corresponding to the hypermetabolic lesion on PET, suspicious for metastasis.  Two additional T2 hyperintense lesions measuring up to 5 mm may reflect cysts (series 6/ images 9 and 20).  Layering gallstones (series 6/ image 22), without associated inflammatory changes. No intrahepatic or extrahepatic ductal dilatation.  Pancreas: Within normal limits.  Spleen: Within normal limits.  Adrenals/Urinary Tract: Adrenal glands are within normal limits.  Kidneys are within normal limits. No hydronephrosis.  Stomach/Bowel: Stomach is within normal limits.  Visualized bowel is unremarkable.  Vascular/Lymphatic: No evidence of  abdominal aortic aneurysm.  Small upper abdominal lymph nodes, including:  --13 mm short axis gastrohepatic node (series 6/image 17), suspicious  --11 mm short axis node in the porta hepatis (series 6/ image 21), indeterminate  Other: No abdominal ascites.  Musculoskeletal: No focal osseous lesions.  IMPRESSION: Lower esophageal wall thickening, likely corresponding to known primary esophageal neoplasm.  1.5 cm hepatic lesion in segment 7, corresponding to the hypermetabolic lesion on CT, suspicious for metastasis.  Two upper abdominal lymph nodes measuring up to 1.3 cm short axis, suspicious for nodal metastases.  Cholelithiasis.   Electronically Signed  By: SJulian HyM.D.  On: 04/30/2015 17:58   Study Result     CLINICAL DATA: Initial treatment strategy for esophageal cancer.  EXAM: NUCLEAR MEDICINE PET SKULL BASE TO THIGH  TECHNIQUE: 12.54 mCi F-18 FDG was injected intravenously. Full-ring PET imaging was performed from the skull base to thigh after the radiotracer. CT data was obtained and used for attenuation correction and anatomic localization.  FASTING BLOOD GLUCOSE: Value: 93 mg/dl  COMPARISON: CT scans 03/01/2015 and 03/08/2015  FINDINGS: NECK  No hypermetabolic lymph nodes in the neck.  CHEST  Elongated esophageal mass involving the distal esophagus is markedly hypermetabolic with SUV max of 475.88 The small mediastinal lymph nodes are not clearly hypermetabolic but still  somewhat worrisome. 10 mm gastrohepatic ligament lymph node is hypermetabolic with SUV max of 8.3.  Improved left-sided pleural effusion. Wedge-shaped area of atelectasis or infiltrate is noted with SUV max of 5.44. The small residual pleural effusion is also mildly hypermetabolic with SUV max of 3.6. This could be infectious.  ABDOMEN/PELVIS  Hypermetabolic lesions suspected in the right hepatic lobe posteriorly. This has an SUV max  of 5.0. I do not see an obvious lesion on the CT scan but this is suspicious for metastasis. MRI abdomen without and with contrast may be helpful for further evaluation.  Borderline enlarged celiac axis and periportal lymph nodes are not hypermetabolic. No retroperitoneal adenopathy. No significant findings in the pelvis. No inguinal adenopathy.  SKELETON  No hypermetabolic or worrisome bone lesions.  IMPRESSION: 1. Elongated distal esophageal mass is markedly hypermetabolic and consistent with esophageal cancer. Small mediastinal lymph nodes are not hypermetabolic. There is a single 10 mm gastrohepatic ligament lymph node which is hypermetabolic. 2. Suspect a right hepatic lobe metastatic lesion. MRI abdomen without with contrast may be helpful for further evaluation. 3. Persistent but improved left lower lobe airspace process and left pleural effusion. These areas are hypermetabolic but likely due to infection.   Electronically Signed  By: Brent Franco M.D.  On: 04/05/2015 11:32    ASSESSMENT & PLAN:  Poorly differentiated esophageal carcinoma, distal esophagus HX IVDA, Alcohol use Hepatitis C Dysphagia COPD Anemia  Liver lesion, questionable metastases  The patient was in a treatment bed reciveing Cycle #4 of Carboplatin/Paclitaxel.   He is going to get Neupogen shots Tuesday, Wednesday and Friday of this week given ANC of 1500 today to ensure deliver of his next cycle of chemotherapy on time next week.   He will get refills of Oxycontin, Percocet and Xanax today.   We again reviewed his oral intake, frequent small meals, glucerna etc.    In regards to his tachycardia, I discussed with the patient and his wife that we can add a low dose beta-blocker as long as his BP tolerates it. We will reassess this later this week.  He was encouraged to come in for nausea medication, and/or IVF.   He will return for a formal follow up next week.   Regarding his liver  lesion, repeat imaging will be needed. Alpha-fetoprotein may be beneficial in the near future given his history of hepatitis C.   All questions were answered. The patient knows to call the clinic with any problems, questions or concerns.  This document serves as a record of services personally performed by Ancil Linsey, MD. It was created on her behalf by Kandace Blitz, a trained medical scribe. The creation of this record is based on the scribe's personal observations and the provider's statements to them. This document has been checked and approved by the attending provider.  I have reviewed the above documentation for accuracy and completeness, and I agree with the above.  This note was electronically signed.  Molli Hazard, MD  05/21/2015 12:16 PM

## 2015-05-21 NOTE — Progress Notes (Signed)
Tolerated infusion well. 

## 2015-05-21 NOTE — Patient Instructions (Addendum)
[place] at [facility] Discharge Instructions  RECOMMENDATIONS MADE BY THE CONSULTANT AND ANY TEST RESULTS WILL BE SENT TO YOUR REFERRING PHYSICIAN.   Exam and discussion by Dr Whitney Muse today Neupogen Tuesday through Friday this week. Your white blood cell count is 2.3 Refill on oxycontin, percocet, and xanax  Chemotherapy today Keep an eye on your heart rate and let us know if your heart rate stays above 120 Call us if you think you need fluids Return to see the doctor in 1 week Please call the clinic if you have any questions or concerns    Thank you for choosing [place] at [facility] to provide your oncology and hematology care.  To afford each patient quality time with our provider, please arrive at least 15 minutes before your scheduled appointment time.   Beginning January 23rd 2017 lab work for the [facility] will be done in the  Main lab at [facility] on 1st floor. If you have a lab appointment with the [place] please come in thru the  Main Entrance and check in at the main information desk  You need to re-schedule your appointment should you arrive 10 or more minutes late.  We strive to give you quality time with our providers, and arriving late affects you and other patients whose appointments are after yours.  Also, if you no show three or more times for appointments you may be dismissed from the clinic at the providers discretion.     Again, thank you for choosing [facility].  Our hope is that these requests will decrease the amount of time that you wait before being seen by our physicians.       _____________________________________________________________  Should you have questions after your visit to [facility], please contact our office at [phone] between the hours of 8:30 a.m. and 4:30 p.m.  Voicemails left after 4:30 p.m. will not be returned until the following business day.   For prescription refill requests, have your pharmacy contact our office.         Resources For Cancer Patients and their Caregivers ? American Cancer Society: Can assist with transportation, wigs, general needs, runs Look Good Feel Better.        [phone] ? Cancer Care: Provides financial assistance, online support groups, medication/co-pay assistance.  1-800-813-HOPE 979-663-6190) ? [place] Assists [place] cancer patients and their families through emotional , educational and financial support.  2493305374 ? [place] DSS Where to apply for food stamps, Medicaid and utility assistance. 306-566-1282 ? RCATS: Transportation to medical appointments. 505-179-5114 ? Social Security Administration: May apply for disability if have a Stage IV cancer. [phone] ? [employer], Disability and Transit Services: Assists with nutrition, care and transit needs. [phone]

## 2015-05-21 NOTE — Patient Instructions (Signed)
..  [facility] Discharge Instructions for Patients Receiving Chemotherapy   Beginning January 23rd 2017 lab work for the [facility] will be done in the  Main lab at [facility] on 1st floor. If you have a lab appointment with the [place] please come in thru the  Main Entrance and check in at the main information desk   Today you received the following chemotherapy agents Carboplatin and taxol  To help prevent nausea and vomiting after your treatment, we encourage you to take your nausea medication    If you develop nausea and vomiting, or diarrhea that is not controlled by your medication, call the clinic.  The clinic phone number is [phone]. Office hours are Monday-Friday 8:30am-5:00pm.  BELOW ARE SYMPTOMS THAT SHOULD BE REPORTED IMMEDIATELY:  *FEVER GREATER THAN 101.0 F  *CHILLS WITH OR WITHOUT FEVER  NAUSEA AND VOMITING THAT IS NOT CONTROLLED WITH YOUR NAUSEA MEDICATION  *UNUSUAL SHORTNESS OF BREATH  *UNUSUAL BRUISING OR BLEEDING  TENDERNESS IN MOUTH AND THROAT WITH OR WITHOUT PRESENCE OF ULCERS  *URINARY PROBLEMS  *BOWEL PROBLEMS  UNUSUAL RASH Items with * indicate a potential emergency and should be followed up as soon as possible. If you have an emergency after office hours please contact your primary care physician or go to the nearest emergency department.  Please call the clinic during office hours if you have any questions or concerns.   You may also contact the Patient Navigator at [phone] should you have any questions or need assistance in obtaining follow up care.      Resources For Cancer Patients and their Caregivers ? American Cancer Society: Can assist with transportation, wigs, general needs, runs Look Good Feel Better.        512-053-5221 ? Cancer Care: Provides financial assistance, online support groups, medication/co-pay assistance.  1-800-813-HOPE 667-242-5674) ? [place] Assists  [place] cancer patients and their families through emotional , educational and financial support.  908-627-0003 ? [place] DSS Where to apply for food stamps, Medicaid and utility assistance. 201-786-0066 ? RCATS: Transportation to medical appointments. 660-321-4823 ? Social Security Administration: May apply for disability if have a Stage IV cancer. [phone] ? [employer], Disability and Transit Services: Assists with nutrition, care and transit needs. [phone]

## 2015-05-22 ENCOUNTER — Encounter (HOSPITAL_BASED_OUTPATIENT_CLINIC_OR_DEPARTMENT_OTHER): Payer: Medicaid Other

## 2015-05-22 ENCOUNTER — Ambulatory Visit (HOSPITAL_COMMUNITY): Payer: Medicaid Other

## 2015-05-22 DIAGNOSIS — Z5189 Encounter for other specified aftercare: Secondary | ICD-10-CM | POA: Diagnosis not present

## 2015-05-22 DIAGNOSIS — C155 Malignant neoplasm of lower third of esophagus: Secondary | ICD-10-CM

## 2015-05-22 MED ORDER — FILGRASTIM 480 MCG/1.6ML IJ SOLN
480.0000 ug | Freq: Once | INTRAMUSCULAR | Status: AC
  Administered 2015-05-22: 480 ug via SUBCUTANEOUS
  Filled 2015-05-22: qty 1.6

## 2015-05-22 NOTE — Progress Notes (Signed)
Brent Franco presents today for injection per MD orders. Neupogen 480 mcg administered SQ in right Abdomen. Administration without incident. Patient tolerated well.

## 2015-05-22 NOTE — Patient Instructions (Signed)
[place] at [facility] Discharge Instructions  RECOMMENDATIONS MADE BY THE CONSULTANT AND ANY TEST RESULTS WILL BE SENT TO YOUR REFERRING PHYSICIAN.  Neupogen 480 mcg injection given as ordered.  Thank you for choosing [place] at [facility] to provide your oncology and hematology care.  To afford each patient quality time with our provider, please arrive at least 15 minutes before your scheduled appointment time.   Beginning January 23rd 2017 lab work for the [facility] will be done in the  Main lab at [facility] on 1st floor. If you have a lab appointment with the [place] please come in thru the  Main Entrance and check in at the main information desk  You need to re-schedule your appointment should you arrive 10 or more minutes late.  We strive to give you quality time with our providers, and arriving late affects you and other patients whose appointments are after yours.  Also, if you no show three or more times for appointments you may be dismissed from the clinic at the providers discretion.     Again, thank you for choosing [facility].  Our hope is that these requests will decrease the amount of time that you wait before being seen by our physicians.       _____________________________________________________________  Should you have questions after your visit to [facility], please contact our office at [phone] between the hours of 8:30 a.m. and 4:30 p.m.  Voicemails left after 4:30 p.m. will not be returned until the following business day.  For prescription refill requests, have your pharmacy contact our office.         Resources For Cancer Patients and their Caregivers ? American Cancer Society: Can assist with transportation, wigs, general needs, runs Look Good Feel Better.        562-196-2274 ? Cancer Care: Provides financial assistance, online support groups, medication/co-pay  assistance.  1-800-813-HOPE (949) [phone]) ? [place] Assists [place] cancer patients and their families through emotional , educational and financial support.  662-223-0468 ? [place] DSS Where to apply for food stamps, Medicaid and utility assistance. 989-215-9601 ? RCATS: Transportation to medical appointments. [phone] ? Social Security Administration: May apply for disability if have a Stage IV cancer. [phone] ? [employer], Disability and Transit Services: Assists with nutrition, care and transit needs. [phone]

## 2015-05-23 ENCOUNTER — Encounter (HOSPITAL_BASED_OUTPATIENT_CLINIC_OR_DEPARTMENT_OTHER): Payer: Medicaid Other

## 2015-05-23 DIAGNOSIS — C155 Malignant neoplasm of lower third of esophagus: Secondary | ICD-10-CM | POA: Diagnosis present

## 2015-05-23 DIAGNOSIS — Z5189 Encounter for other specified aftercare: Secondary | ICD-10-CM | POA: Diagnosis not present

## 2015-05-23 MED ORDER — FILGRASTIM 480 MCG/1.6ML IJ SOLN
480.0000 ug | Freq: Once | INTRAMUSCULAR | Status: AC
  Administered 2015-05-23: 480 ug via SUBCUTANEOUS
  Filled 2015-05-23: qty 1.6

## 2015-05-23 NOTE — Patient Instructions (Signed)
[place] at [facility] Discharge Instructions  RECOMMENDATIONS MADE BY THE CONSULTANT AND ANY TEST RESULTS WILL BE SENT TO YOUR REFERRING PHYSICIAN.  Neupogen injection today.    Thank you for choosing [place] at [facility] to provide your oncology and hematology care.  To afford each patient quality time with our provider, please arrive at least 15 minutes before your scheduled appointment time.   Beginning January 23rd 2017 lab work for the [facility] will be done in the  Main lab at [facility] on 1st floor. If you have a lab appointment with the [place] please come in thru the  Main Entrance and check in at the main information desk  You need to re-schedule your appointment should you arrive 10 or more minutes late.  We strive to give you quality time with our providers, and arriving late affects you and other patients whose appointments are after yours.  Also, if you no show three or more times for appointments you may be dismissed from the clinic at the providers discretion.     Again, thank you for choosing [facility].  Our hope is that these requests will decrease the amount of time that you wait before being seen by our physicians.       _____________________________________________________________  Should you have questions after your visit to [facility], please contact our office at [phone] between the hours of 8:30 a.m. and 4:30 p.m.  Voicemails left after 4:30 p.m. will not be returned until the following business day.  For prescription refill requests, have your pharmacy contact our office.         Resources For Cancer Patients and their Caregivers ? American Cancer Society: Can assist with transportation, wigs, general needs, runs Look Good Feel Better.        212-401-4137 ? Cancer Care: Provides financial assistance, online support groups, medication/co-pay assistance.   1-800-813-HOPE (779) 704-7152) ? [place] Assists [place] cancer patients and their families through emotional , educational and financial support.  367-022-6131 ? [place] DSS Where to apply for food stamps, Medicaid and utility assistance. 724-049-9069 ? RCATS: Transportation to medical appointments. [phone] ? Social Security Administration: May apply for disability if have a Stage IV cancer. [phone] [phone] ? [employer], Disability and Transit Services: Assists with nutrition, care and transit needs. [phone]

## 2015-05-23 NOTE — Progress Notes (Signed)
Brent Franco presents today for injection per MD orders. Neupogen 432mcg administered SQ in left Abdomen. Administration without incident. Patient tolerated well.

## 2015-05-24 ENCOUNTER — Encounter (HOSPITAL_BASED_OUTPATIENT_CLINIC_OR_DEPARTMENT_OTHER): Payer: Medicaid Other

## 2015-05-24 ENCOUNTER — Encounter (HOSPITAL_COMMUNITY): Payer: Self-pay

## 2015-05-24 DIAGNOSIS — Z5189 Encounter for other specified aftercare: Secondary | ICD-10-CM

## 2015-05-24 DIAGNOSIS — C155 Malignant neoplasm of lower third of esophagus: Secondary | ICD-10-CM

## 2015-05-24 MED ORDER — FILGRASTIM 480 MCG/1.6ML IJ SOLN
480.0000 ug | Freq: Once | INTRAMUSCULAR | Status: AC
  Administered 2015-05-24: 480 ug via SUBCUTANEOUS
  Filled 2015-05-24: qty 1.6

## 2015-05-24 NOTE — Patient Instructions (Signed)
..  [place] at [facility] Discharge Instructions  RECOMMENDATIONS MADE BY THE CONSULTANT AND ANY TEST RESULTS WILL BE SENT TO YOUR REFERRING PHYSICIAN.  Neupogen today  Thank you for choosing [place] at [facility] to provide your oncology and hematology care.  To afford each patient quality time with our provider, please arrive at least 15 minutes before your scheduled appointment time.   Beginning January 23rd 2017 lab work for the [facility] will be done in the  Main lab at [facility] on 1st floor. If you have a lab appointment with the [place] please come in thru the  Main Entrance and check in at the main information desk  You need to re-schedule your appointment should you arrive 10 or more minutes late.  We strive to give you quality time with our providers, and arriving late affects you and other patients whose appointments are after yours.  Also, if you no show three or more times for appointments you may be dismissed from the clinic at the providers discretion.     Again, thank you for choosing [facility].  Our hope is that these requests will decrease the amount of time that you wait before being seen by our physicians.       _____________________________________________________________  Should you have questions after your visit to [facility], please contact our office at [phone] between the hours of 8:30 a.m. and 4:30 p.m.  Voicemails left after 4:30 p.m. will not be returned until the following business day.  For prescription refill requests, have your pharmacy contact our office.         Resources For Cancer Patients and their Caregivers ? American Cancer Society: Can assist with transportation, wigs, general needs, runs Look Good Feel Better.        [phone] ? Cancer Care: Provides financial assistance, online support groups, medication/co-pay assistance.  1-800-813-HOPE  909-632-0125) ? [place] Assists [place] cancer patients and their families through emotional , educational and financial support.  6054734845 ? [place] DSS Where to apply for food stamps, Medicaid and utility assistance. 206-536-5047 ? RCATS: Transportation to medical appointments. [phone] ? Social Security Administration: May apply for disability if have a Stage IV cancer. [phone] ? [employer], [employer]ability and Transit Services: Assists with nutrition, care and transit needs. [phone]

## 2015-05-24 NOTE — Progress Notes (Signed)
..  Brent Franco presents today for injection per the provider's orders.  neupogen administration without incident; see MAR for injection details.  Patient tolerated procedure well and without incident.  No questions or complaints noted at this time.

## 2015-05-25 ENCOUNTER — Encounter (HOSPITAL_BASED_OUTPATIENT_CLINIC_OR_DEPARTMENT_OTHER): Payer: Medicaid Other

## 2015-05-25 ENCOUNTER — Inpatient Hospital Stay (HOSPITAL_COMMUNITY): Payer: Medicaid Other

## 2015-05-25 ENCOUNTER — Ambulatory Visit (HOSPITAL_COMMUNITY): Payer: Medicaid Other | Admitting: Hematology & Oncology

## 2015-05-25 DIAGNOSIS — Z5189 Encounter for other specified aftercare: Secondary | ICD-10-CM

## 2015-05-25 DIAGNOSIS — C155 Malignant neoplasm of lower third of esophagus: Secondary | ICD-10-CM | POA: Diagnosis present

## 2015-05-25 MED ORDER — FILGRASTIM 480 MCG/1.6ML IJ SOLN
480.0000 ug | Freq: Once | INTRAMUSCULAR | Status: AC
  Administered 2015-05-25: 480 ug via SUBCUTANEOUS
  Filled 2015-05-25: qty 1.6

## 2015-05-25 NOTE — Patient Instructions (Signed)
[place] at [facility] Discharge Instructions  RECOMMENDATIONS MADE BY THE CONSULTANT AND ANY TEST RESULTS WILL BE SENT TO YOUR REFERRING PHYSICIAN.  Neupogen 480 mcg injection given as ordered.  Thank you for choosing [place] at [facility] to provide your oncology and hematology care.  To afford each patient quality time with our provider, please arrive at least 15 minutes before your scheduled appointment time.   Beginning January 23rd 2017 lab work for the [facility] will be done in the  Main lab at [facility] on 1st floor. If you have a lab appointment with the [place] please come in thru the  Main Entrance and check in at the main information desk  You need to re-schedule your appointment should you arrive 10 or more minutes late.  We strive to give you quality time with our providers, and arriving late affects you and other patients whose appointments are after yours.  Also, if you no show three or more times for appointments you may be dismissed from the clinic at the providers discretion.     Again, thank you for choosing [facility].  Our hope is that these requests will decrease the amount of time that you wait before being seen by our physicians.       _____________________________________________________________  Should you have questions after your visit to [facility], please contact our office at [phone] between the hours of 8:30 a.m. and 4:30 p.m.  Voicemails left after 4:30 p.m. will not be returned until the following business day.  For prescription refill requests, have your pharmacy contact our office.         Resources For Cancer Patients and their Caregivers ? American Cancer Society: Can assist with transportation, wigs, general needs, runs Look Good Feel Better.        [phone] ? Cancer Care: Provides financial assistance, online support groups, medication/co-pay  assistance.  1-800-813-HOPE 445-880-6700) ? [place] Assists [place] cancer patients and their families through emotional , educational and financial support.  [phone] ? [place] DSS Where to apply for food stamps, Medicaid and utility assistance. 6607250300 ? RCATS: Transportation to medical appointments. 650-763-1035 ? Social Security Administration: May apply for disability if have a Stage IV cancer. [phone] ? [employer] and [employer] Services: Assists with nutrition, care and transit needs. [phone]

## 2015-05-25 NOTE — Progress Notes (Signed)
Brent Franco presents today for injection per MD orders. Neupogen 480 mcg administered SQ in right Abdomen. Administration without incident. Patient tolerated well.

## 2015-05-28 ENCOUNTER — Encounter (HOSPITAL_COMMUNITY): Payer: Self-pay

## 2015-05-28 ENCOUNTER — Encounter (HOSPITAL_BASED_OUTPATIENT_CLINIC_OR_DEPARTMENT_OTHER): Payer: Medicaid Other | Admitting: Hematology & Oncology

## 2015-05-28 ENCOUNTER — Encounter (HOSPITAL_BASED_OUTPATIENT_CLINIC_OR_DEPARTMENT_OTHER): Payer: Medicaid Other

## 2015-05-28 VITALS — BP 125/77 | HR 104 | Temp 97.2°F | Resp 16 | Wt 166.0 lb

## 2015-05-28 DIAGNOSIS — C155 Malignant neoplasm of lower third of esophagus: Secondary | ICD-10-CM

## 2015-05-28 DIAGNOSIS — Z5111 Encounter for antineoplastic chemotherapy: Secondary | ICD-10-CM | POA: Diagnosis present

## 2015-05-28 DIAGNOSIS — B182 Chronic viral hepatitis C: Secondary | ICD-10-CM | POA: Diagnosis not present

## 2015-05-28 DIAGNOSIS — R740 Nonspecific elevation of levels of transaminase and lactic acid dehydrogenase [LDH]: Secondary | ICD-10-CM

## 2015-05-28 DIAGNOSIS — K229 Disease of esophagus, unspecified: Secondary | ICD-10-CM | POA: Diagnosis not present

## 2015-05-28 DIAGNOSIS — R634 Abnormal weight loss: Secondary | ICD-10-CM | POA: Diagnosis not present

## 2015-05-28 DIAGNOSIS — R7401 Elevation of levels of liver transaminase levels: Secondary | ICD-10-CM

## 2015-05-28 LAB — COMPREHENSIVE METABOLIC PANEL
ALT: 102 U/L — AB (ref 17–63)
AST: 81 U/L — AB (ref 15–41)
Albumin: 3.5 g/dL (ref 3.5–5.0)
Alkaline Phosphatase: 89 U/L (ref 38–126)
Anion gap: 6 (ref 5–15)
BILIRUBIN TOTAL: 0.5 mg/dL (ref 0.3–1.2)
BUN: 10 mg/dL (ref 6–20)
CHLORIDE: 103 mmol/L (ref 101–111)
CO2: 27 mmol/L (ref 22–32)
CREATININE: 0.56 mg/dL — AB (ref 0.61–1.24)
Calcium: 8.7 mg/dL — ABNORMAL LOW (ref 8.9–10.3)
GFR calc Af Amer: >60 mL/min (ref >60)
Glucose, Bld: 175 mg/dL — ABNORMAL HIGH (ref 65–99)
Potassium: 4.1 mmol/L (ref 3.5–5.1)
Sodium: 136 mmol/L (ref 135–145)
Total Protein: 6.8 g/dL (ref 6.5–8.1)

## 2015-05-28 LAB — CBC WITH DIFFERENTIAL/PLATELET
Basophils Absolute: 0 10*3/uL (ref 0.0–0.1)
Basophils Relative: 0 %
EOS PCT: 1 %
Eosinophils Absolute: 0 10*3/uL (ref 0.0–0.7)
HEMATOCRIT: 31.9 % — AB (ref 39.0–52.0)
Hemoglobin: 10.5 g/dL — ABNORMAL LOW (ref 13.0–17.0)
LYMPHS PCT: 14 %
Lymphs Abs: 0.6 10*3/uL — ABNORMAL LOW (ref 0.7–4.0)
MCH: 29.7 pg (ref 26.0–34.0)
MCHC: 32.9 g/dL (ref 30.0–36.0)
MCV: 90.1 fL (ref 78.0–100.0)
MONOS PCT: 27 %
Monocytes Absolute: 1.2 10*3/uL — ABNORMAL HIGH (ref 0.1–1.0)
NEUTROS PCT: 58 %
Neutro Abs: 2.5 10*3/uL (ref 1.7–7.7)
PLATELETS: 103 10*3/uL — AB (ref 150–400)
RBC: 3.54 MIL/uL — AB (ref 4.22–5.81)
RDW: 16 % — ABNORMAL HIGH (ref 11.5–15.5)
WBC: 4.3 10*3/uL (ref 4.0–10.5)

## 2015-05-28 MED ORDER — DIPHENHYDRAMINE HCL 50 MG/ML IJ SOLN
INTRAMUSCULAR | Status: AC
  Filled 2015-05-28: qty 1

## 2015-05-28 MED ORDER — DIPHENHYDRAMINE HCL 50 MG/ML IJ SOLN
50.0000 mg | Freq: Once | INTRAMUSCULAR | Status: AC
  Administered 2015-05-28: 50 mg via INTRAVENOUS

## 2015-05-28 MED ORDER — PALONOSETRON HCL INJECTION 0.25 MG/5ML
INTRAVENOUS | Status: AC
  Filled 2015-05-28: qty 5

## 2015-05-28 MED ORDER — HEPARIN SOD (PORK) LOCK FLUSH 100 UNIT/ML IV SOLN
500.0000 [IU] | Freq: Once | INTRAVENOUS | Status: AC | PRN
  Administered 2015-05-28: 500 [IU]
  Filled 2015-05-28: qty 5

## 2015-05-28 MED ORDER — SODIUM CHLORIDE 0.9 % IV SOLN
Freq: Once | INTRAVENOUS | Status: AC
  Administered 2015-05-28: 12:00:00 via INTRAVENOUS

## 2015-05-28 MED ORDER — PALONOSETRON HCL INJECTION 0.25 MG/5ML
0.2500 mg | Freq: Once | INTRAVENOUS | Status: AC
Start: 2015-05-28 — End: 2015-05-28
  Administered 2015-05-28: 0.25 mg via INTRAVENOUS

## 2015-05-28 MED ORDER — FAMOTIDINE IN NACL 20-0.9 MG/50ML-% IV SOLN
20.0000 mg | Freq: Once | INTRAVENOUS | Status: AC
  Administered 2015-05-28: 20 mg via INTRAVENOUS

## 2015-05-28 MED ORDER — PACLITAXEL CHEMO INJECTION 300 MG/50ML
50.0000 mg/m2 | Freq: Once | INTRAVENOUS | Status: AC
  Administered 2015-05-28: 102 mg via INTRAVENOUS
  Filled 2015-05-28: qty 17

## 2015-05-28 MED ORDER — SODIUM CHLORIDE 0.9 % IV SOLN
265.8000 mg | Freq: Once | INTRAVENOUS | Status: AC
  Administered 2015-05-28: 270 mg via INTRAVENOUS
  Filled 2015-05-28: qty 27

## 2015-05-28 MED ORDER — DEXAMETHASONE SODIUM PHOSPHATE 100 MG/10ML IJ SOLN
20.0000 mg | Freq: Once | INTRAMUSCULAR | Status: AC
  Administered 2015-05-28: 20 mg via INTRAVENOUS
  Filled 2015-05-28: qty 2

## 2015-05-28 MED ORDER — SODIUM CHLORIDE 0.9% FLUSH
10.0000 mL | INTRAVENOUS | Status: DC | PRN
  Administered 2015-05-28: 10 mL
  Filled 2015-05-28: qty 10

## 2015-05-28 MED ORDER — FAMOTIDINE IN NACL 20-0.9 MG/50ML-% IV SOLN
INTRAVENOUS | Status: AC
  Filled 2015-05-28: qty 50

## 2015-05-28 NOTE — Progress Notes (Signed)
Brent Franco at [place] Note  Patient Care Team: [name], NP as PCP - General (Nurse Practitioner) Daneil Dolin, MD as Consulting Physician (Gastroenterology)  CHIEF COMPLAINTS:  EGD 03/26/2015 with bulky esophageal tumor involving the distal esophagus encroaching significantly upon the lumen. Lesion begins at 35cm from the incisors and extends down to 39 cm approximately 1 cm above the GE junction. No varices were noted, no portal gastropathy noted.  Pathology with invasive poorly differentiated carcinoma, extensive ulceration with fungal organisms (c/w candida)    Esophageal cancer ([place])   03/08/2015 Imaging CT chest at [facility]- abnormal adenopathy (hilar and mediastinal)   03/26/2015 Procedure EGD by Dr. Gala Romney with abnormal findings and biopsy   03/26/2015 Pathology Results Esophagus, biopsy - INVASIVE POORLY DIFFERENTIATED CARCINOMA, SEE COMMENT. - EXTENSIVE ULCERATION WITH FUNGAL ORGANISMS.   04/05/2015 PET scan Elongated distal esophageal mass is markedly hypermetabolic. Small mediastinal lymph nodes are not hypermetabolic. Single 10 mm gastrohepatic ligament lymph node which is hypermetabolic. Suspect a right hepatic lobe metastatic lesion.   04/15/2015 - 04/21/2015 Hospital Admission [facility]                   HISTORY OF PRESENTING ILLNESS:  Brent Franco 65 y.o. male is here for follow-up of esophageal carcinoma. He is on concurrent therapy.   Mr. Balling is accompanied by his wife. He is here for Cycle #5 Carboplatin/Paclitaxel today.   The patient has lost almost 30 lbs. since late December. He has been eating apple juice, soup, ice cream, pudding, cereal, and jello. His wife even blended beef in the blender that he was able to eat. They bought some protein to add to meals, however when he tried it he felt so full he could not eat again for a while. He does not like this bloated feeling. He tries to eat as much as he can, though sometimes it is hard.  States he might be hungrier if he was more active. His wife encourages him to walk. He plans to begin weightlifting and has a weight bench at home. He notes that he feels fairly well. He denies poorly controlled pain.   His wife reports he felt good this week. He has been sleeping better.     MEDICAL HISTORY:  Past Medical History  Diagnosis Date  . COPD (chronic obstructive pulmonary disease) ([place])   . ETOH abuse     stopped 10 years ago  . Depression     suicide attempt 20 years ago  . Arthritis   . Asthma   . Viral hepatitis C   . Diabetes mellitus without complication ([place])   . Hypothyroidism   . Cancer ([place])     esophageal  . Pneumonia 02/2015  . Itching     both feet, known has athlet's feet now    SURGICAL HISTORY: Past Surgical History  Procedure Laterality Date  . Other surgical history Left 1995    fatty tissue tumor right upper thigh  . Dental surgery N/A January 2016    "dentures and spurs"   . Tumor excision Left 1990    inner thigh  . Colonoscopy with propofol N/A 03/26/2015    Procedure: COLONOSCOPY WITH PROPOFOL;  Surgeon: Daneil Dolin, MD;  [facility];  Service: Endoscopy;  Laterality: N/A;  1100  . Esophagogastroduodenoscopy (egd) with propofol N/A 03/26/2015    Procedure: ESOPHAGOGASTRODUODENOSCOPY (EGD) WITH PROPOFOL;  Surgeon: Daneil Dolin, MD;  [facility];  Service: Endoscopy;  Laterality: N/A;  . Biopsy  03/26/2015    Procedure: BIOPSY;  Surgeon: Daneil Dolin, MD;  [facility];  Service: Endoscopy;;  esophageal mass  . Polypectomy  03/26/2015    Procedure: POLYPECTOMY;  Surgeon: Daneil Dolin, MD;  [facility];  Service: Endoscopy;;  polypectomy at ileocecal valve    SOCIAL HISTORY: Social History   Social History  . Marital Status: Married    Spouse Name: N/A  . Number of Children: N/A  . Years of Education: N/A   Occupational History  . Not on file.   Social History Main Topics  .  Smoking status: Former Smoker -- 1.50 packs/day for 47 years    Types: Cigarettes    Start date: 12/03/1966    Quit date: 12/08/2013  . Smokeless tobacco: Never Used  . Alcohol Use: No     Comment: Quit 2006: previously drank heavily. Had 1 drink 2-3 years ago (2013-2014)  . Drug Use: No     Comment: Previous IV drug use as teenager, none in 40 years. Last marijuanna use 02/22/15.  Marland Kitchen Sexual Activity: Yes   Other Topics Concern  . Not on file   Social History Narrative   Married 33 years, 2 children, 7 and 32. No grandchildren. He's quit smoking for about almost 16 months. History of IVDA Has had problems in the past with alcohol, but quit many years ago. 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 Pancreatic cancer Mother   . Lung cancer Father   . Colon cancer Neg Hx    indicated that his mother is deceased. He indicated that his father is deceased.    Mother and father deceased Mother was around 62, father around 56 Father died of lung cancer; his father was a smoker Mother died of pancreatic cancer 1 brother dead from being shot in the head Older brother hasn't been seen or heard from in 10-12 years  ALLERGIES:  is allergic to demerol.  MEDICATIONS:  Current Outpatient Prescriptions  Medication Sig Dispense Refill  . ACCU-CHEK AVIVA PLUS test strip 2 (two) times daily. use for testing  2  . acetaminophen (TYLENOL) 500 MG tablet Take 500 mg by mouth every 6 (six) hours as needed for fever.    Marland Kitchen albuterol (PROVENTIL HFA;VENTOLIN HFA) 108 (90 BASE) MCG/ACT inhaler Inhale 1 puff into the lungs every 6 (six) hours as needed for wheezing or shortness of breath.    . ALPRAZolam (XANAX) 1 MG tablet Take 1 tablet (1 mg total) by mouth every 8 (eight) hours as needed for anxiety. 90 tablet 1  .  docusate sodium (COLACE) 100 MG capsule Take 100 mg by mouth 2 (two) times daily.  4  . FLUoxetine (PROZAC) 20 MG capsule Take 1 capsule (20 mg total) by mouth daily. 30 capsule 3  . HUMULIN R 100 UNIT/ML injection Inject 2-12 Units into the skin 2 (two) times daily as needed for high blood sugar. 151-200 = 2 units, 201-250 = 4 units, 251-300 = 6 units, 301-350 = 8 units, 351-400 = 10 units, 401-450 = 12 units.  2  . ipratropium-albuterol (DUONEB) 0.5-2.5 (3) MG/3ML SOLN INHALE CONTENTS OF 1 VIAL IN NEBULIZER EVERY 4 HOURS AS NEEDED FOR SHORTNESS OF BREATH.  3  . ketoconazole (NIZORAL) 2 % cream  Apply 1 application topically 2 (two) times daily.  6  . levofloxacin (LEVAQUIN) 500 MG tablet Take 1 tablet (500 mg total) by mouth daily. 10 tablet 0  . levothyroxine (SYNTHROID, LEVOTHROID) 25 MCG tablet Take 25 mcg by mouth daily.  5  . lidocaine-prilocaine (EMLA) cream Apply a quarter size amount to port site 1 hour prior to chemo. Do not rub in. Cover with plastic wrap. 30 g 3  . meloxicam (MOBIC) 7.5 MG tablet Reported on 05/07/2015  6  . metFORMIN (GLUCOPHAGE) 500 MG tablet Take 500 mg by mouth 2 (two) times daily.  3  . mometasone (NASONEX) 50 MCG/ACT nasal spray Place 2 sprays into the nose daily as needed (congestion).     . nitroGLYCERIN (NITROSTAT) 0.4 MG SL tablet Place 1 tablet (0.4 mg total) under the tongue every 5 (five) minutes as needed for chest pain. 25 tablet 3  . ondansetron (ZOFRAN) 8 MG tablet Take 1 tablet (8 mg total) by mouth every 8 (eight) hours as needed for nausea or vomiting. 30 tablet 2  . oxyCODONE (OXYCONTIN) 20 mg 12 hr tablet Take 1 tablet (20 mg total) by mouth every 8 (eight) hours. 90 tablet 0  . oxyCODONE-acetaminophen (ROXICET) 5-325 MG tablet Take 1-2 tablets by mouth every 6 (six) hours as needed for moderate pain or severe pain. 60 tablet 0  . predniSONE (DELTASONE) 10 MG tablet Take 1 tablet (10 mg total) by mouth daily with breakfast. Take 6 tablets today and  then decrease by 1 tablet daily until none are left. 21 tablet 0  . prochlorperazine (COMPAZINE) 10 MG tablet Take 1 tablet (10 mg total) by mouth every 6 (six) hours as needed for nausea or vomiting. 30 tablet 2  . Pseudoeph-Doxylamine-DM-APAP (NYQUIL MULTI-SYMPTOM PO) Take 1 tablet by mouth daily as needed (congestion/cold).     . SPIRIVA HANDIHALER 18 MCG inhalation capsule Place 1 puff into inhaler and inhale daily.  5  . sucralfate (CARAFATE) 1 g tablet Take 1 tablet by mouth 2 (two) times daily.  5   No current facility-administered medications for this visit.    Review of Systems  Constitutional: Positive for weight loss. Negative for fever, chills and malaise/fatigue.       Weight loss of almost 30 lbs. since late December  HENT: Negative for congestion, hearing loss, nosebleeds and tinnitus.   Eyes: Negative.  Negative for blurred vision, double vision, pain and discharge.  Respiratory: Negative.  Negative for cough, hemoptysis, sputum production, shortness of breath and wheezing.   Cardiovascular: Negative for palpitations, claudication, leg swelling and PND.  Gastrointestinal: Negative for heartburn, nausea, vomiting, diarrhea, constipation, blood in stool and melena.  Genitourinary: Negative.  Negative for dysuria, urgency, frequency and hematuria.  Musculoskeletal: Positive for joint pain. Negative for myalgias and falls.  Skin: Negative.  Negative for itching and rash.  Neurological: Negative.  Negative for dizziness, tingling, tremors, sensory change, speech change, focal weakness, seizures, loss of consciousness, weakness and headaches.  Endo/Heme/Allergies: Negative.  Does not bruise/bleed easily.  Psychiatric/Behavioral: Positive for depression. Negative for suicidal ideas, memory loss and substance abuse. The patient is nervous/anxious.   All other systems reviewed and are negative.  14 point ROS was done and is otherwise as detailed above or in HPI   PHYSICAL  EXAMINATION: ECOG PERFORMANCE STATUS: 1 - Symptomatic but completely ambulatory  Vitals with BMI 05/28/2015  Height   Weight 166 lbs  BMI   Systolic 250  Diastolic 77  Pulse 037  Respirations 16    Physical Exam  Constitutional: He is oriented to person, place, and time and well-developed, well-nourished, and in no distress.  Thin. Wears glasses. In treatment bed.  HENT:  Head: Normocephalic and atraumatic.  Nose: Nose normal.  Mouth/Throat: Oropharynx is clear and moist. He has dentures. No oropharyngeal exudate.  Dentures on top and bottom.  Eyes: Conjunctivae and EOM are normal. Pupils are equal, round, and reactive to light. Right eye exhibits no discharge. Left eye exhibits no discharge. No scleral icterus.  Neck: Normal range of motion. Neck supple. No tracheal deviation present. No thyromegaly present.  Cardiovascular: Normal rate, regular rhythm and normal heart sounds.  Exam reveals no gallop and no friction rub.   No murmur heard. Pulmonary/Chest: Effort normal and breath sounds normal. He has no wheezes. He has no rales.  Abdominal: Soft. Bowel sounds are normal. He exhibits no distension and no mass. There is no tenderness. There is no rebound and no guarding.  Musculoskeletal: Normal range of motion. He exhibits no edema.  Lymphadenopathy:    He has no cervical adenopathy.  Neurological: He is alert and oriented to person, place, and time. He has normal reflexes. No cranial nerve deficit. Gait normal. Coordination normal.  Skin: Skin is warm and dry. No rash noted.  Psychiatric: Mood, memory, affect and judgment normal.  Nursing note and vitals reviewed.   LABORATORY DATA:  I have reviewed the data as listed Results for RAZI, HICKLE (MRN [id number]) as of 05/29/2015 15:41  Ref. Range 05/28/2015 10:13  Sodium Latest Ref Range: 135-145 mmol/L 136  Potassium Latest Ref Range: 3.5-5.1 mmol/L 4.1  Chloride Latest Ref Range: 101-111 mmol/L 103  CO2 Latest Ref Range:  22-32 mmol/L 27  BUN Latest Ref Range: 6-20 mg/dL 10  Creatinine Latest Ref Range: 0.61-1.24 mg/dL 0.56 (L)  Calcium Latest Ref Range: 8.9-10.3 mg/dL 8.7 (L)  EGFR (Non-African Amer.) Latest Ref Range: >60 mL/min >60  EGFR (African American) Latest Ref Range: >60 mL/min >60  Glucose Latest Ref Range: 65-99 mg/dL 175 (H)  Anion gap Latest Ref Range: 5-15  6  Alkaline Phosphatase Latest Ref Range: 38-126 U/L 89  Albumin Latest Ref Range: 3.5-5.0 g/dL 3.5  AST Latest Ref Range: 15-41 U/L 81 (H)  ALT Latest Ref Range: 17-63 U/L 102 (H)  Total Protein Latest Ref Range: 6.5-8.1 g/dL 6.8  Total Bilirubin Latest Ref Range: 0.3-1.2 mg/dL 0.5  WBC Latest Ref Range: 4.0-10.5 K/uL 4.3  RBC Latest Ref Range: 4.22-5.81 MIL/uL 3.54 (L)  Hemoglobin Latest Ref Range: 13.0-17.0 g/dL 10.5 (L)  HCT Latest Ref Range: 39.0-52.0 % 31.9 (L)  MCV Latest Ref Range: 78.0-100.0 fL 90.1  MCH Latest Ref Range: 26.0-34.0 pg 29.7  MCHC Latest Ref Range: 30.0-36.0 g/dL 32.9  RDW Latest Ref Range: 11.5-15.5 % 16.0 (H)  Platelets Latest Ref Range: 150-400 K/uL 103 (L)  Neutrophils Latest Units: % 58  Lymphocytes Latest Units: % 14  Monocytes Relative Latest Units: % 27  Eosinophil Latest Units: % 1  Basophil Latest Units: % 0  NEUT# Latest Ref Range: 1.7-7.7 K/uL 2.5  Lymphocyte # Latest Ref Range: 0.7-4.0 K/uL 0.6 (L)  Monocyte # Latest Ref Range: 0.1-1.0 K/uL 1.2 (H)  Eosinophils Absolute Latest Ref Range: 0.0-0.7 K/uL 0.0  Basophils Absolute Latest Ref Range: 0.0-0.1 K/uL 0.0  WBC Morphology Unknown MILD LEFT SHIFT (...   PATHOLOGY:       RADIOGRAPHIC STUDIES: I have personally reviewed the radiological images as listed and agreed with the  findings in the report. Study Result     CLINICAL DATA: Esophageal cancer. Possible liver lesion on PET.  EXAM: MRI ABDOMEN WITHOUT AND WITH CONTRAST  TECHNIQUE: Multiplanar multisequence MR imaging of the abdomen was performed both before and after the  administration of intravenous contrast.  CONTRAST: 36m MULTIHANCE GADOBENATE DIMEGLUMINE 529 MG/ML IV SOLN  COMPARISON: PET-CT dated 04/05/2015.  FINDINGS: Lower chest: 2.0 cm patchy/nodular opacity in the posterior right lower lobe (series 6/image 2), new, likely reflecting atelectasis or possibly infection.  Lower esophageal wall thickening, likely corresponding to known primary esophageal neoplasm.  Hepatobiliary: 1.5 cm T2 hyperintense lesion in segment 7 (series 27/image 13), with vague enhancement, and corresponding to the hypermetabolic lesion on PET, suspicious for metastasis.  Two additional T2 hyperintense lesions measuring up to 5 mm may reflect cysts (series 6/ images 9 and 20).  Layering gallstones (series 6/ image 22), without associated inflammatory changes. No intrahepatic or extrahepatic ductal dilatation.  Pancreas: Within normal limits.  Spleen: Within normal limits.  Adrenals/Urinary Tract: Adrenal glands are within normal limits.  Kidneys are within normal limits. No hydronephrosis.  Stomach/Bowel: Stomach is within normal limits.  Visualized bowel is unremarkable.  Vascular/Lymphatic: No evidence of abdominal aortic aneurysm.  Small upper abdominal lymph nodes, including:  --13 mm short axis gastrohepatic node (series 6/image 17), suspicious  --11 mm short axis node in the porta hepatis (series 6/ image 21), indeterminate  Other: No abdominal ascites.  Musculoskeletal: No focal osseous lesions.  IMPRESSION: Lower esophageal wall thickening, likely corresponding to known primary esophageal neoplasm.  1.5 cm hepatic lesion in segment 7, corresponding to the hypermetabolic lesion on CT, suspicious for metastasis.  Two upper abdominal lymph nodes measuring up to 1.3 cm short axis, suspicious for nodal metastases.  Cholelithiasis.   Electronically Signed  By: SJulian HyM.D.  On: 04/30/2015 17:58     Study Result     CLINICAL DATA: Initial treatment strategy for esophageal cancer.  EXAM: NUCLEAR MEDICINE PET SKULL BASE TO THIGH  TECHNIQUE: 12.54 mCi F-18 FDG was injected intravenously. Full-ring PET imaging was performed from the skull base to thigh after the radiotracer. CT data was obtained and used for attenuation correction and anatomic localization.  FASTING BLOOD GLUCOSE: Value: 93 mg/dl  COMPARISON: CT scans 03/01/2015 and 03/08/2015  FINDINGS: NECK  No hypermetabolic lymph nodes in the neck.  CHEST  Elongated esophageal mass involving the distal esophagus is markedly hypermetabolic with SUV max of 446.27 The small mediastinal lymph nodes are not clearly hypermetabolic but still somewhat worrisome. 10 mm gastrohepatic ligament lymph node is hypermetabolic with SUV max of 8.3.  Improved left-sided pleural effusion. Wedge-shaped area of atelectasis or infiltrate is noted with SUV max of 5.44. The small residual pleural effusion is also mildly hypermetabolic with SUV max of 3.6. This could be infectious.  ABDOMEN/PELVIS  Hypermetabolic lesions suspected in the right hepatic lobe posteriorly. This has an SUV max of 5.0. I do not see an obvious lesion on the CT scan but this is suspicious for metastasis. MRI abdomen without and with contrast may be helpful for further evaluation.  Borderline enlarged celiac axis and periportal lymph nodes are not hypermetabolic. No retroperitoneal adenopathy. No significant findings in the pelvis. No inguinal adenopathy.  SKELETON  No hypermetabolic or worrisome bone lesions.  IMPRESSION: 1. Elongated distal esophageal mass is markedly hypermetabolic and consistent with esophageal cancer. Small mediastinal lymph nodes are not hypermetabolic. There is a single 10 mm gastrohepatic ligament lymph node which is hypermetabolic.  2. Suspect a right hepatic lobe metastatic lesion. MRI abdomen without with  contrast may be helpful for further evaluation. 3. Persistent but improved left lower lobe airspace process and left pleural effusion. These areas are hypermetabolic but likely due to infection.   Electronically Signed  By: Marijo Sanes M.D.  On: 04/05/2015 11:32    ASSESSMENT & PLAN:  Poorly differentiated esophageal carcinoma, distal esophagus HX IVDA, Alcohol use Hepatitis C Dysphagia COPD Anemia  Liver lesion, questionable metastases  The patient was in a treatment bed reciveing Cycle #5 of Carboplatin/Paclitaxel. He has hepatitis C, LFT's have remained fairly stable, they are slightly up today and this will be closely monitored.   The patient has lost a significant amount of weight over the past few months; although he is doing well. On 02/27/15 he weighed 192 lbs, while today he weighs 166 lbs. The patient plans to increase his physical activity. He does not like boost/ensure/glucerna but he has been starting to blend solid food and is able to eat them, he was encouraged to eat frequently.  If weight is down again next week, I will have him meet with Ovid Curd.   He will return in 1 week for follow up.   All questions were answered. The patient knows to call the clinic with any problems, questions or concerns.  This document serves as a record of services personally performed by Ancil Linsey, MD. It was created on her behalf by Arlyce Harman, a trained medical scribe. The creation of this record is based on the scribe's personal observations and the provider's statements to them. This document has been checked and approved by the attending provider.  I have reviewed the above documentation for accuracy and completeness, and I agree with the above.  This note was electronically signed.  Molli Hazard, MD  05/28/2015 10:48 AM

## 2015-05-28 NOTE — Progress Notes (Signed)
Tolerated chemo well. Stable on discharge home with wife via wheelchair. 

## 2015-05-28 NOTE — Patient Instructions (Signed)
[facility] Discharge Instructions for Patients Receiving Chemotherapy   Beginning January 23rd 2017 lab work for the [facility] will be done in the  Main lab at [facility] on 1st floor. If you have a lab appointment with the [place] please come in thru the  Main Entrance and check in at the main information desk   Today you received the following chemotherapy agents Taxol and Carbo.  To help prevent nausea and vomiting after your treatment, we encourage you to take your nausea medication as instructed. If you develop nausea and vomiting, or diarrhea that is not controlled by your medication, call the clinic.  The clinic phone number is [phone]. Office hours are Monday-Friday 8:30am-5:00pm.  BELOW ARE SYMPTOMS THAT SHOULD BE REPORTED IMMEDIATELY:  *FEVER GREATER THAN 101.0 F  *CHILLS WITH OR WITHOUT FEVER  NAUSEA AND VOMITING THAT IS NOT CONTROLLED WITH YOUR NAUSEA MEDICATION  *UNUSUAL SHORTNESS OF BREATH  *UNUSUAL BRUISING OR BLEEDING  TENDERNESS IN MOUTH AND THROAT WITH OR WITHOUT PRESENCE OF ULCERS  *URINARY PROBLEMS  *BOWEL PROBLEMS  UNUSUAL RASH Items with * indicate a potential emergency and should be followed up as soon as possible. If you have an emergency after office hours please contact your primary care physician or go to the nearest emergency department.  Please call the clinic during office hours if you have any questions or concerns.   You may also contact the Patient Navigator at [phone] should you have any questions or need assistance in obtaining follow up care.  Resources For Cancer Patients and their Caregivers ? American Cancer Society: Can assist with transportation, wigs, general needs, runs Look Good Feel Better.        732-741-4131 ? Cancer Care: Provides financial assistance, online support groups, medication/co-pay assistance.  1-800-813-HOPE (708)012-2775) ? [place] Assists  [place] cancer patients and their families through emotional , educational and financial support.  (307) 509-7042 ? [place] DSS Where to apply for food stamps, Medicaid and utility assistance. 445 398 1035 ? [facility]: Transportation to medical appointments. (438)734-6344 ? Social Security Administration: May apply for disability if have a Stage IV cancer. 513-449-6176 8635430857 ? [employer], Disability and Transit Services: Assists with nutrition, care and transit needs. ([phone] 809-9067

## 2015-05-28 NOTE — Patient Instructions (Addendum)
[place] at [facility] Discharge Instructions  RECOMMENDATIONS MADE BY THE CONSULTANT AND ANY TEST RESULTS WILL BE SENT TO YOUR REFERRING PHYSICIAN.   Exam and discussion by Dr Whitney Muse today Chemotherapy today, if your WBCs are low we will give you neupogen Please call if you think you need fluids Try to gain weight Return to see the doctor in 1 week to see the doctor and chemotherapy Please call the clinic if you have any questions or concerns    Thank you for choosing [place] at [facility] to provide your oncology and hematology care.  To afford each patient quality time with our provider, please arrive at least 15 minutes before your scheduled appointment time.   Beginning January 23rd 2017 lab work for the [facility] will be done in the  Main lab at [facility] on 1st floor. If you have a lab appointment with the [facility] please come in thru the  Main Entrance and check in at the main information desk  You need to re-schedule your appointment should you arrive 10 or more minutes late.  We strive to give you quality time with our providers, and arriving late affects you and other patients whose appointments are after yours.  Also, if you no show three or more times for appointments you may be dismissed from the clinic at the providers discretion.     Again, thank you for choosing [facility].  Our hope is that these requests will decrease the amount of time that you wait before being seen by our physicians.       _____________________________________________________________  Should you have questions after your visit to [facility], please contact our office at [phone] between the hours of 8:30 a.m. and 4:30 p.m.  Voicemails left after 4:30 p.m. will not be returned until the following business day.  For prescription refill requests, have your pharmacy contact our office.         Resources  For Cancer Patients and their Caregivers ? American Cancer Society: Can assist with transportation, wigs, general needs, runs Look Good Feel Better.        639-030-2481 ? Cancer Care: Provides financial assistance, online support groups, medication/co-pay assistance.  1-800-813-HOPE 770-638-3198) ? [place] Assists [place] cancer patients and their families through emotional , educational and financial support.  706-149-0023 ? [place] DSS Where to apply for food stamps, Medicaid and utility assistance. (956)114-8957 ? [facility]: Transportation to medical appointments. 418-741-1789 ? Social Security Administration: May apply for disability if have a Stage IV cancer. [phone] 831-266-6019 ? [employer], Disability and Transit Services: Assists with nutrition, care and transit needs. [phone]

## 2015-05-29 ENCOUNTER — Encounter (HOSPITAL_COMMUNITY): Payer: Self-pay | Admitting: Hematology & Oncology

## 2015-06-01 ENCOUNTER — Inpatient Hospital Stay (HOSPITAL_COMMUNITY): Payer: Medicaid Other

## 2015-06-01 ENCOUNTER — Ambulatory Visit (HOSPITAL_COMMUNITY): Payer: Medicaid Other | Admitting: Hematology & Oncology

## 2015-06-04 ENCOUNTER — Encounter (HOSPITAL_BASED_OUTPATIENT_CLINIC_OR_DEPARTMENT_OTHER): Payer: Medicaid Other

## 2015-06-04 ENCOUNTER — Encounter (HOSPITAL_BASED_OUTPATIENT_CLINIC_OR_DEPARTMENT_OTHER): Payer: Medicaid Other | Admitting: Hematology & Oncology

## 2015-06-04 ENCOUNTER — Encounter (HOSPITAL_COMMUNITY): Payer: Self-pay | Admitting: Hematology & Oncology

## 2015-06-04 VITALS — BP 104/61 | HR 96 | Temp 97.8°F | Resp 18 | Wt 168.2 lb

## 2015-06-04 DIAGNOSIS — C155 Malignant neoplasm of lower third of esophagus: Secondary | ICD-10-CM

## 2015-06-04 DIAGNOSIS — D630 Anemia in neoplastic disease: Secondary | ICD-10-CM

## 2015-06-04 DIAGNOSIS — K769 Liver disease, unspecified: Secondary | ICD-10-CM | POA: Diagnosis not present

## 2015-06-04 DIAGNOSIS — K229 Disease of esophagus, unspecified: Secondary | ICD-10-CM | POA: Diagnosis not present

## 2015-06-04 DIAGNOSIS — D649 Anemia, unspecified: Secondary | ICD-10-CM

## 2015-06-04 DIAGNOSIS — R634 Abnormal weight loss: Secondary | ICD-10-CM

## 2015-06-04 DIAGNOSIS — R740 Nonspecific elevation of levels of transaminase and lactic acid dehydrogenase [LDH]: Secondary | ICD-10-CM | POA: Diagnosis not present

## 2015-06-04 DIAGNOSIS — D701 Agranulocytosis secondary to cancer chemotherapy: Secondary | ICD-10-CM

## 2015-06-04 DIAGNOSIS — D6481 Anemia due to antineoplastic chemotherapy: Secondary | ICD-10-CM

## 2015-06-04 DIAGNOSIS — B182 Chronic viral hepatitis C: Secondary | ICD-10-CM | POA: Diagnosis not present

## 2015-06-04 DIAGNOSIS — R530 Neoplastic (malignant) related fatigue: Secondary | ICD-10-CM | POA: Diagnosis not present

## 2015-06-04 DIAGNOSIS — T451X5A Adverse effect of antineoplastic and immunosuppressive drugs, initial encounter: Principal | ICD-10-CM

## 2015-06-04 DIAGNOSIS — R7401 Elevation of levels of liver transaminase levels: Secondary | ICD-10-CM

## 2015-06-04 LAB — CBC WITH DIFFERENTIAL/PLATELET
Basophils Absolute: 0 10*3/uL (ref 0.0–0.1)
Basophils Relative: 0 %
EOS PCT: 2 %
Eosinophils Absolute: 0 10*3/uL (ref 0.0–0.7)
HEMATOCRIT: 24.5 % — AB (ref 39.0–52.0)
Hemoglobin: 8.3 g/dL — ABNORMAL LOW (ref 13.0–17.0)
LYMPHS ABS: 0.3 10*3/uL — AB (ref 0.7–4.0)
LYMPHS PCT: 21 %
MCH: 29.9 pg (ref 26.0–34.0)
MCHC: 33.9 g/dL (ref 30.0–36.0)
MCV: 88.1 fL (ref 78.0–100.0)
MONO ABS: 0.2 10*3/uL (ref 0.1–1.0)
Monocytes Relative: 11 %
Neutro Abs: 0.9 10*3/uL — ABNORMAL LOW (ref 1.7–7.7)
Neutrophils Relative %: 66 %
PLATELETS: 67 10*3/uL — AB (ref 150–400)
RBC: 2.78 MIL/uL — ABNORMAL LOW (ref 4.22–5.81)
RDW: 15.5 % (ref 11.5–15.5)
WBC: 1.3 10*3/uL — CL (ref 4.0–10.5)

## 2015-06-04 LAB — COMPREHENSIVE METABOLIC PANEL
ALT: 120 U/L — AB (ref 17–63)
AST: 96 U/L — ABNORMAL HIGH (ref 15–41)
Albumin: 3.1 g/dL — ABNORMAL LOW (ref 3.5–5.0)
Alkaline Phosphatase: 72 U/L (ref 38–126)
Anion gap: 7 (ref 5–15)
BILIRUBIN TOTAL: 0.5 mg/dL (ref 0.3–1.2)
BUN: 13 mg/dL (ref 6–20)
CHLORIDE: 103 mmol/L (ref 101–111)
CO2: 26 mmol/L (ref 22–32)
CREATININE: 0.6 mg/dL — AB (ref 0.61–1.24)
Calcium: 8.3 mg/dL — ABNORMAL LOW (ref 8.9–10.3)
Glucose, Bld: 276 mg/dL — ABNORMAL HIGH (ref 65–99)
POTASSIUM: 4.1 mmol/L (ref 3.5–5.1)
Sodium: 136 mmol/L (ref 135–145)
TOTAL PROTEIN: 6 g/dL — AB (ref 6.5–8.1)

## 2015-06-04 LAB — PREPARE RBC (CROSSMATCH)

## 2015-06-04 LAB — ABO/RH: ABO/RH(D): O POS

## 2015-06-04 MED ORDER — HEPARIN SOD (PORK) LOCK FLUSH 100 UNIT/ML IV SOLN
INTRAVENOUS | Status: AC
  Filled 2015-06-04: qty 5

## 2015-06-04 MED ORDER — FILGRASTIM 480 MCG/0.8ML IJ SOSY
480.0000 ug | PREFILLED_SYRINGE | Freq: Once | INTRAMUSCULAR | Status: DC
  Filled 2015-06-04: qty 0.8

## 2015-06-04 MED ORDER — FILGRASTIM 480 MCG/1.6ML IJ SOLN
480.0000 ug | Freq: Once | INTRAMUSCULAR | Status: AC
  Administered 2015-06-04: 480 ug via SUBCUTANEOUS

## 2015-06-04 MED ORDER — HEPARIN SOD (PORK) LOCK FLUSH 100 UNIT/ML IV SOLN
500.0000 [IU] | Freq: Once | INTRAVENOUS | Status: AC
  Administered 2015-06-04: 500 [IU] via INTRAVENOUS

## 2015-06-04 MED ORDER — FILGRASTIM 480 MCG/0.8ML IJ SOSY: 480.0000 ug | PREFILLED_SYRINGE | Freq: Once | INTRAMUSCULAR | Status: DC

## 2015-06-04 NOTE — Progress Notes (Signed)
[place] at [place] Note  Patient Care Team: [name], NP as PCP - General (Nurse Practitioner) Daneil Dolin, MD as Consulting Physician (Gastroenterology)  CHIEF COMPLAINTS:  EGD 03/26/2015 with bulky esophageal tumor involving the distal esophagus encroaching significantly upon the lumen. Lesion begins at 35cm from the incisors and extends down to 39 cm approximately 1 cm above the GE junction. No varices were noted, no portal gastropathy noted.  Pathology with invasive poorly differentiated carcinoma, extensive ulceration with fungal organisms (c/w candida)    Esophageal cancer ([place])   03/08/2015 Imaging CT chest at [facility]- abnormal adenopathy (hilar and mediastinal)   03/26/2015 Procedure EGD by Dr. Gala Romney with abnormal findings and biopsy   03/26/2015 Pathology Results Esophagus, biopsy - INVASIVE POORLY DIFFERENTIATED CARCINOMA, SEE COMMENT. - EXTENSIVE ULCERATION WITH FUNGAL ORGANISMS.   04/05/2015 PET scan Elongated distal esophageal mass is markedly hypermetabolic. Small mediastinal lymph nodes are not hypermetabolic. Single 10 mm gastrohepatic ligament lymph node which is hypermetabolic. Suspect a right hepatic lobe metastatic lesion.   04/15/2015 - 04/21/2015 Hospital Admission [facility]   04/30/2015 Imaging MRI abd- 1.5 cm hepatic lesion in segment 7, corresponding to the hypermetabolic lesion on CT, suspicious for metastasis. Two upper abdominal lymph nodes measuring up to 1.3 cm short axis, suspicious for nodal metastases.   04/30/2015 -  Chemotherapy Weekly Carboplatin/Paclitaxel with XRT    Radiation Therapy Dr. Isidore Moos    HISTORY OF PRESENTING ILLNESS:  Brent Franco 65 y.o. male is here for follow-up of esophageal carcinoma. He has completed concurrent therapy.   Mr. Fahs is accompanied by his wife.  I personally reviewed and went over laboratory results with the patient at length. He completed radiation treatment last week. Patient presents in  treatment bed with the lights dimmed. He completed 5 cycles of concurrent carbo/taxol.   He feels weak and without energy. His wife states he has been eating extra to gain weight. He has been eating better with ground up hamburger and vegetables. Denies hemoptysis. No major weight loss over the past week.   He had one bad night where his throat hurt. He has only experienced a total of two nights like this since starting radiation therapy.   No fever, no chills, no vomiting. He has been out and done some walking. Mood is still fairly positive.    MEDICAL HISTORY:  Past Medical History  Diagnosis Date  . COPD (chronic obstructive pulmonary disease) ([place])   . ETOH abuse     stopped 10 years ago  . Depression     suicide attempt 20 years ago  . Arthritis   . Asthma   . Viral hepatitis C   . Diabetes mellitus without complication ([place])   . Hypothyroidism   . Cancer ([place])     esophageal  . Pneumonia 02/2015  . Itching     both feet, known has athlet's feet now    SURGICAL HISTORY: Past Surgical History  Procedure Laterality Date  . Other surgical history Left 1995    fatty tissue tumor right upper thigh  . Dental surgery N/A January 2016    "dentures and spurs"   . Tumor excision Left 1990    inner thigh  . Colonoscopy with propofol N/A 03/26/2015    Procedure: COLONOSCOPY WITH PROPOFOL;  Surgeon: Daneil Dolin, MD;  [facility];  Service: Endoscopy;  Laterality: N/A;  1100  . Esophagogastroduodenoscopy (egd) with propofol N/A 03/26/2015    Procedure: ESOPHAGOGASTRODUODENOSCOPY (  EGD) WITH PROPOFOL;  Surgeon: Daneil Dolin, MD;  [facility];  Service: Endoscopy;  Laterality: N/A;  . Biopsy  03/26/2015    Procedure: BIOPSY;  Surgeon: Daneil Dolin, MD;  [facility];  Service: Endoscopy;;  esophageal mass  . Polypectomy  03/26/2015    Procedure: POLYPECTOMY;  Surgeon: Daneil Dolin, MD;  [facility];  Service: Endoscopy;;  polypectomy  at ileocecal valve    SOCIAL HISTORY: Social History   Social History  . Marital Status: Married    Spouse Name: N/A  . Number of Children: N/A  . Years of Education: N/A   Occupational History  . Not on file.   Social History Main Topics  . Smoking status: Former Smoker -- 1.50 packs/day for 47 years    Types: Cigarettes    Start date: 12/03/1966    Quit date: 12/08/2013  . Smokeless tobacco: Never Used  . Alcohol Use: No     Comment: Quit 2006: previously drank heavily. Had 1 drink 2-3 years ago (2013-2014)  . Drug Use: No     Comment: Previous IV drug use as teenager, none in 40 years. Last marijuanna use 02/22/15.  Marland Kitchen Sexual Activity: Yes   Other Topics Concern  . Not on file   Social History Narrative   Married 33 years, 2 children, 33 and 51. No grandchildren. He's quit smoking for about almost 16 months. History of IVDA Has had problems in the past with alcohol, but quit many years ago. 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 Pancreatic cancer Mother   . Lung cancer Father   . Colon cancer Neg Hx    indicated that his mother is deceased. He indicated that his father is deceased.    Mother and father deceased Mother was around 22, father around 58 Father died of lung cancer; his father was a smoker Mother died of pancreatic cancer 1 brother dead from being shot in the head Older brother hasn't been seen or heard from in 10-12 years  ALLERGIES:  is allergic to demerol.  MEDICATIONS:  Current Outpatient Prescriptions  Medication Sig Dispense Refill  . ACCU-CHEK AVIVA PLUS test strip 2 (two) times daily. use for testing  2  . acetaminophen (TYLENOL) 500 MG tablet Take 500 mg by mouth every 6 (six) hours as needed for fever.    Marland Kitchen albuterol (PROVENTIL HFA;VENTOLIN HFA)  108 (90 BASE) MCG/ACT inhaler Inhale 1 puff into the lungs every 6 (six) hours as needed for wheezing or shortness of breath.    . ALPRAZolam (XANAX) 1 MG tablet Take 1 tablet (1 mg total) by mouth every 8 (eight) hours as needed for anxiety. 90 tablet 1  . docusate sodium (COLACE) 100 MG capsule Take 100 mg by mouth 2 (two) times daily.  4  . FLUoxetine (PROZAC) 20 MG capsule Take 1 capsule (20 mg total) by mouth daily. 30 capsule 3  . HUMULIN R 100 UNIT/ML injection Inject 2-12 Units into the skin 2 (two) times daily as needed for high blood sugar. 151-200 = 2 units, 201-250 = 4 units, 251-300 = 6 units, 301-350 = 8 units, 351-400 = 10 units, 401-450 = 12 units.  2  . ipratropium-albuterol (DUONEB) 0.5-2.5 (3) MG/3ML SOLN INHALE CONTENTS OF 1 VIAL IN NEBULIZER EVERY 4 HOURS  AS NEEDED FOR SHORTNESS OF BREATH.  3  . ketoconazole (NIZORAL) 2 % cream Apply 1 application topically 2 (two) times daily.  6  . levofloxacin (LEVAQUIN) 500 MG tablet Take 1 tablet (500 mg total) by mouth daily. 10 tablet 0  . levothyroxine (SYNTHROID, LEVOTHROID) 25 MCG tablet Take 25 mcg by mouth daily.  5  . lidocaine-prilocaine (EMLA) cream Apply a quarter size amount to port site 1 hour prior to chemo. Do not rub in. Cover with plastic wrap. 30 g 3  . meloxicam (MOBIC) 7.5 MG tablet Reported on 05/07/2015  6  . metFORMIN (GLUCOPHAGE) 500 MG tablet Take 500 mg by mouth 2 (two) times daily.  3  . mometasone (NASONEX) 50 MCG/ACT nasal spray Place 2 sprays into the nose daily as needed (congestion).     . nitroGLYCERIN (NITROSTAT) 0.4 MG SL tablet Place 1 tablet (0.4 mg total) under the tongue every 5 (five) minutes as needed for chest pain. 25 tablet 3  . ondansetron (ZOFRAN) 8 MG tablet Take 1 tablet (8 mg total) by mouth every 8 (eight) hours as needed for nausea or vomiting. 30 tablet 2  . oxyCODONE (OXYCONTIN) 20 mg 12 hr tablet Take 1 tablet (20 mg total) by mouth every 8 (eight) hours. 90 tablet 0  .  oxyCODONE-acetaminophen (ROXICET) 5-325 MG tablet Take 1-2 tablets by mouth every 6 (six) hours as needed for moderate pain or severe pain. 60 tablet 0  . predniSONE (DELTASONE) 10 MG tablet Take 1 tablet (10 mg total) by mouth daily with breakfast. Take 6 tablets today and then decrease by 1 tablet daily until none are left. 21 tablet 0  . prochlorperazine (COMPAZINE) 10 MG tablet Take 1 tablet (10 mg total) by mouth every 6 (six) hours as needed for nausea or vomiting. 30 tablet 2  . Pseudoeph-Doxylamine-DM-APAP (NYQUIL MULTI-SYMPTOM PO) Take 1 tablet by mouth daily as needed (congestion/cold).     . SPIRIVA HANDIHALER 18 MCG inhalation capsule Place 1 puff into inhaler and inhale daily.  5  . sucralfate (CARAFATE) 1 g tablet Take 1 tablet by mouth 2 (two) times daily.  5   No current facility-administered medications for this visit.    Review of Systems  Constitutional: Positive for malaise/fatigue. Negative for fever, chills and weight loss.  HENT: Positive for sore throat. Negative for congestion, hearing loss, nosebleeds and tinnitus.        Sore throat one night.  Eyes: Negative.  Negative for blurred vision, double vision, pain and discharge.  Respiratory: Negative.  Negative for cough, hemoptysis, sputum production, shortness of breath and wheezing.   Cardiovascular: Negative for palpitations, claudication, leg swelling and PND.  Gastrointestinal: Negative for heartburn, nausea, vomiting, diarrhea, constipation, blood in stool and melena.  Genitourinary: Negative.  Negative for dysuria, urgency, frequency and hematuria.  Musculoskeletal: Positive for joint pain. Negative for myalgias and falls.  Skin: Negative.  Negative for itching and rash.  Neurological: Positive for weakness. Negative for dizziness, tingling, tremors, sensory change, speech change, focal weakness, seizures, loss of consciousness and headaches.  Endo/Heme/Allergies: Negative.  Does not bruise/bleed easily.    Psychiatric/Behavioral: Positive for depression. Negative for suicidal ideas, memory loss and substance abuse. The patient is nervous/anxious.   All other systems reviewed and are negative.  14 point ROS was done and is otherwise as detailed above or in HPI   PHYSICAL EXAMINATION: ECOG PERFORMANCE STATUS: 1 - Symptomatic but completely ambulatory   Vitals with BMI 06/04/2015  Height  Weight 168 lbs 3 oz  BMI   Systolic 737  Diastolic 61  Pulse 96  Respirations 18   Physical Exam  Constitutional: He is oriented to person, place, and time and well-developed, well-nourished, and in no distress.  Thin. Wears glasses. In treatment bed.  HENT:  Head: Normocephalic and atraumatic.  Nose: Nose normal.  Mouth/Throat: Oropharynx is clear and moist. He has dentures. No oropharyngeal exudate.  Dentures on top and bottom.  Eyes: Conjunctivae and EOM are normal. Pupils are equal, round, and reactive to light. Right eye exhibits no discharge. Left eye exhibits no discharge. No scleral icterus.  Neck: Normal range of motion. Neck supple. No tracheal deviation present. No thyromegaly present.  Cardiovascular: Normal rate, regular rhythm and normal heart sounds.  Exam reveals no gallop and no friction rub.   No murmur heard. Pulmonary/Chest: Effort normal and breath sounds normal. He has no wheezes. He has no rales.  Abdominal: Soft. Bowel sounds are normal. He exhibits no distension and no mass. There is no tenderness. There is no rebound and no guarding.  Musculoskeletal: Normal range of motion. He exhibits no edema.  Lymphadenopathy:    He has no cervical adenopathy.  Neurological: He is alert and oriented to person, place, and time. He has normal reflexes. No cranial nerve deficit. Gait normal. Coordination normal.  Skin: Skin is warm and dry. No rash noted.  Psychiatric: Mood, memory, affect and judgment normal.  Nursing note and vitals reviewed.   LABORATORY DATA:  I have reviewed the  data as listed Results for INDY, KUCK (MRN [id number]) as of 06/04/2015 12:59  Ref. Range 06/04/2015 10:08  Sodium Latest Ref Range: 135-145 mmol/L 136  Potassium Latest Ref Range: 3.5-5.1 mmol/L 4.1  Chloride Latest Ref Range: 101-111 mmol/L 103  CO2 Latest Ref Range: 22-32 mmol/L 26  BUN Latest Ref Range: 6-20 mg/dL 13  Creatinine Latest Ref Range: 0.61-1.24 mg/dL 0.60 (L)  Calcium Latest Ref Range: 8.9-10.3 mg/dL 8.3 (L)  EGFR (Non-African Amer.) Latest Ref Range: >60 mL/min >60  EGFR (African American) Latest Ref Range: >60 mL/min >60  Glucose Latest Ref Range: 65-99 mg/dL 276 (H)  Anion gap Latest Ref Range: 5-15  7  Alkaline Phosphatase Latest Ref Range: 38-126 U/L 72  Albumin Latest Ref Range: 3.5-5.0 g/dL 3.1 (L)  AST Latest Ref Range: 15-41 U/L 96 (H)  ALT Latest Ref Range: 17-63 U/L 120 (H)  Total Protein Latest Ref Range: 6.5-8.1 g/dL 6.0 (L)  Total Bilirubin Latest Ref Range: 0.3-1.2 mg/dL 0.5  WBC Latest Ref Range: 4.0-10.5 K/uL 1.3 (LL)  RBC Latest Ref Range: 4.22-5.81 MIL/uL 2.78 (L)  Hemoglobin Latest Ref Range: 13.0-17.0 g/dL 8.3 (L)  HCT Latest Ref Range: 39.0-52.0 % 24.5 (L)  MCV Latest Ref Range: 78.0-100.0 fL 88.1  MCH Latest Ref Range: 26.0-34.0 pg 29.9  MCHC Latest Ref Range: 30.0-36.0 g/dL 33.9  RDW Latest Ref Range: 11.5-15.5 % 15.5  Platelets Latest Ref Range: 150-400 K/uL 67 (L)  Neutrophils Latest Units: % 66  Lymphocytes Latest Units: % 21  Monocytes Relative Latest Units: % 11  Eosinophil Latest Units: % 2  Basophil Latest Units: % 0  NEUT# Latest Ref Range: 1.7-7.7 K/uL 0.9 (L)  Lymphocyte # Latest Ref Range: 0.7-4.0 K/uL 0.3 (L)  Monocyte # Latest Ref Range: 0.1-1.0 K/uL 0.2  Eosinophils Absolute Latest Ref Range: 0.0-0.7 K/uL 0.0  Basophils Absolute Latest Ref Range: 0.0-0.1 K/uL 0.0     PATHOLOGY:       RADIOGRAPHIC STUDIES:  I have personally reviewed the radiological images as listed and agreed with the findings in the  report. Study Result     CLINICAL DATA: Esophageal cancer. Possible liver lesion on PET.  EXAM: MRI ABDOMEN WITHOUT AND WITH CONTRAST  TECHNIQUE: Multiplanar multisequence MR imaging of the abdomen was performed both before and after the administration of intravenous contrast.  CONTRAST: 7m MULTIHANCE GADOBENATE DIMEGLUMINE 529 MG/ML IV SOLN  COMPARISON: PET-CT dated 04/05/2015.  FINDINGS: Lower chest: 2.0 cm patchy/nodular opacity in the posterior right lower lobe (series 6/image 2), new, likely reflecting atelectasis or possibly infection.  Lower esophageal wall thickening, likely corresponding to known primary esophageal neoplasm.  Hepatobiliary: 1.5 cm T2 hyperintense lesion in segment 7 (series 27/image 13), with vague enhancement, and corresponding to the hypermetabolic lesion on PET, suspicious for metastasis.  Two additional T2 hyperintense lesions measuring up to 5 mm may reflect cysts (series 6/ images 9 and 20).  Layering gallstones (series 6/ image 22), without associated inflammatory changes. No intrahepatic or extrahepatic ductal dilatation.  Pancreas: Within normal limits.  Spleen: Within normal limits.  Adrenals/Urinary Tract: Adrenal glands are within normal limits.  Kidneys are within normal limits. No hydronephrosis.  Stomach/Bowel: Stomach is within normal limits.  Visualized bowel is unremarkable.  Vascular/Lymphatic: No evidence of abdominal aortic aneurysm.  Small upper abdominal lymph nodes, including:  --13 mm short axis gastrohepatic node (series 6/image 17), suspicious  --11 mm short axis node in the porta hepatis (series 6/ image 21), indeterminate  Other: No abdominal ascites.  Musculoskeletal: No focal osseous lesions.  IMPRESSION: Lower esophageal wall thickening, likely corresponding to known primary esophageal neoplasm.  1.5 cm hepatic lesion in segment 7, corresponding to  the hypermetabolic lesion on CT, suspicious for metastasis.  Two upper abdominal lymph nodes measuring up to 1.3 cm short axis, suspicious for nodal metastases.  Cholelithiasis.   Electronically Signed  By: SJulian HyM.D.  On: 04/30/2015 17:58   Study Result     CLINICAL DATA: Initial treatment strategy for esophageal cancer.  EXAM: NUCLEAR MEDICINE PET SKULL BASE TO THIGH  TECHNIQUE: 12.54 mCi F-18 FDG was injected intravenously. Full-ring PET imaging was performed from the skull base to thigh after the radiotracer. CT data was obtained and used for attenuation correction and anatomic localization.  FASTING BLOOD GLUCOSE: Value: 93 mg/dl  COMPARISON: CT scans 03/01/2015 and 03/08/2015  FINDINGS: NECK  No hypermetabolic lymph nodes in the neck.  CHEST  Elongated esophageal mass involving the distal esophagus is markedly hypermetabolic with SUV max of 447.42 The small mediastinal lymph nodes are not clearly hypermetabolic but still somewhat worrisome. 10 mm gastrohepatic ligament lymph node is hypermetabolic with SUV max of 8.3.  Improved left-sided pleural effusion. Wedge-shaped area of atelectasis or infiltrate is noted with SUV max of 5.44. The small residual pleural effusion is also mildly hypermetabolic with SUV max of 3.6. This could be infectious.  ABDOMEN/PELVIS  Hypermetabolic lesions suspected in the right hepatic lobe posteriorly. This has an SUV max of 5.0. I do not see an obvious lesion on the CT scan but this is suspicious for metastasis. MRI abdomen without and with contrast may be helpful for further evaluation.  Borderline enlarged celiac axis and periportal lymph nodes are not hypermetabolic. No retroperitoneal adenopathy. No significant findings in the pelvis. No inguinal adenopathy.  SKELETON  No hypermetabolic or worrisome bone lesions.  IMPRESSION: 1. Elongated distal esophageal mass is markedly  hypermetabolic and consistent with esophageal cancer. Small mediastinal lymph nodes are not hypermetabolic.  There is a single 10 mm gastrohepatic ligament lymph node which is hypermetabolic. 2. Suspect a right hepatic lobe metastatic lesion. MRI abdomen without with contrast may be helpful for further evaluation. 3. Persistent but improved left lower lobe airspace process and left pleural effusion. These areas are hypermetabolic but likely due to infection.   Electronically Signed  By: Marijo Sanes M.D.  On: 04/05/2015 11:32    ASSESSMENT & PLAN:  Poorly differentiated esophageal carcinoma, distal esophagus HX IVDA, Alcohol use Hepatitis C Dysphagia COPD Anemia  Liver lesion, questionable metastases  He has completed therapy. I have recommended 2 U PRBC given his fatigue and SOB with exertion. He has hepatitis C, LFT's have remained fairly stable, they are slightly up today but stable since last week and this will be closely monitored. I have recommended several days of neupogen for his neutropenia given his comorbidities.   Weight has remained stable over the past week, he has done a good job with his oral intake.   He is scheduled for follow up with Dr. Isidore Moos of radiation oncology on April 25th. I will review Dr. Pearlie Oyster last visit note.   He will return for follow up next week with blood counts. We will address observation moving forward in regards to additional imaging.    All questions were answered. The patient knows to call the clinic with any problems, questions or concerns.  This document serves as a record of services personally performed by Ancil Linsey, MD. It was created on her behalf by Arlyce Harman, a trained medical scribe. The creation of this record is based on the scribe's personal observations and the provider's statements to them. This document has been checked and approved by the attending provider.  I have reviewed the above documentation for  accuracy and completeness, and I agree with the above.  This note was electronically signed.  Molli Hazard, MD  06/04/2015 10:31 AM

## 2015-06-04 NOTE — Progress Notes (Signed)
Chemo held today.  Neupogen administered per protocol. Port flushed per protocol after labs drawn. Patient to return 3/28 for blood transfusion. Discharged with his wife via w/c

## 2015-06-04 NOTE — Progress Notes (Signed)
CRITICAL VALUE ALERT Critical value received:  WBC 1.3 Date of notification:  06/04/2015 Time of notification: [id number] Critical value read back:  Yes.   Nurse who received alert:  C.Brittane Grudzinski RN Kirby Crigler  Notified.

## 2015-06-04 NOTE — Patient Instructions (Addendum)
[place] at [facility] Discharge Instructions  RECOMMENDATIONS MADE BY THE CONSULTANT AND ANY TEST RESULTS WILL BE SENT TO YOUR REFERRING PHYSICIAN.   Exam and discussion by Dr Whitney Muse today Neupogen today, Tuesday and Wednesday Blood work on Thursday Possible treatment on Friday 2 units of blood tomorrow  Return to see the doctor in 1 weeks with labs  Please call the clinic if you have any questions or concerns    Thank you for choosing [place] at [facility] to provide your oncology and hematology care.  To afford each patient quality time with our provider, please arrive at least 15 minutes before your scheduled appointment time.   Beginning January 23rd 2017 lab work for the [facility] will be done in the  Main lab at [facility] on 1st floor. If you have a lab appointment with the [facility] please come in thru the  Main Entrance and check in at the main information desk  You need to re-schedule your appointment should you arrive 10 or more minutes late.  We strive to give you quality time with our providers, and arriving late affects you and other patients whose appointments are after yours.  Also, if you no show three or more times for appointments you may be dismissed from the clinic at the providers discretion.     Again, thank you for choosing [facility].  Our hope is that these requests will decrease the amount of time that you wait before being seen by our physicians.       _____________________________________________________________  Should you have questions after your visit to [facility], please contact our office at [phone] between the hours of 8:30 a.m. and 4:30 p.m.  Voicemails left after 4:30 p.m. will not be returned until the following business day.  For prescription refill requests, have your pharmacy contact our office.         Resources For Cancer Patients and their  Caregivers ? American Cancer Society: Can assist with transportation, wigs, general needs, runs Look Good Feel Better.        204-051-7764 ? Cancer Care: Provides financial assistance, online support groups, medication/co-pay assistance.  1-800-813-HOPE (267)662-1857) ? [place] Assists [place] cancer patients and their families through emotional , educational and financial support.  608-563-7822 ? [place] DSS Where to apply for food stamps, Medicaid and utility assistance. 7438218670 ? RCATS: Transportation to medical appointments. 210-692-4520 ? Social Security Administration: May apply for disability if have a Stage IV cancer. [phone] ? [employer], Disability and Transit Services: Assists with nutrition, care and transit needs. [phone]

## 2015-06-05 ENCOUNTER — Encounter (HOSPITAL_BASED_OUTPATIENT_CLINIC_OR_DEPARTMENT_OTHER): Payer: Medicaid Other

## 2015-06-05 ENCOUNTER — Encounter (HOSPITAL_COMMUNITY): Payer: Medicaid Other

## 2015-06-05 VITALS — BP 111/70 | HR 80 | Temp 97.6°F | Resp 16

## 2015-06-05 DIAGNOSIS — D6481 Anemia due to antineoplastic chemotherapy: Secondary | ICD-10-CM | POA: Diagnosis not present

## 2015-06-05 DIAGNOSIS — K229 Disease of esophagus, unspecified: Secondary | ICD-10-CM | POA: Diagnosis not present

## 2015-06-05 DIAGNOSIS — T451X5A Adverse effect of antineoplastic and immunosuppressive drugs, initial encounter: Principal | ICD-10-CM

## 2015-06-05 MED ORDER — DIPHENHYDRAMINE HCL 25 MG PO CAPS
ORAL_CAPSULE | ORAL | Status: AC
  Filled 2015-06-05: qty 1

## 2015-06-05 MED ORDER — HEPARIN SOD (PORK) LOCK FLUSH 100 UNIT/ML IV SOLN
INTRAVENOUS | Status: AC
  Filled 2015-06-05: qty 5

## 2015-06-05 MED ORDER — SODIUM CHLORIDE 0.9% FLUSH
10.0000 mL | INTRAVENOUS | Status: AC | PRN
  Administered 2015-06-05: 10 mL

## 2015-06-05 MED ORDER — FILGRASTIM 480 MCG/1.6ML IJ SOLN
480.0000 ug | Freq: Once | INTRAMUSCULAR | Status: DC
  Administered 2015-06-05: 480 ug via SUBCUTANEOUS
  Filled 2015-06-05: qty 1.6

## 2015-06-05 MED ORDER — ACETAMINOPHEN 325 MG PO TABS
650.0000 mg | ORAL_TABLET | Freq: Once | ORAL | Status: AC
  Administered 2015-06-05: 650 mg via ORAL

## 2015-06-05 MED ORDER — ACETAMINOPHEN 325 MG PO TABS
ORAL_TABLET | ORAL | Status: AC
  Filled 2015-06-05: qty 2

## 2015-06-05 MED ORDER — DIPHENHYDRAMINE HCL 25 MG PO CAPS
25.0000 mg | ORAL_CAPSULE | Freq: Once | ORAL | Status: AC
  Administered 2015-06-05: 25 mg via ORAL

## 2015-06-05 MED ORDER — SODIUM CHLORIDE 0.9 % IV SOLN
250.0000 mL | Freq: Once | INTRAVENOUS | Status: AC
  Administered 2015-06-05: 250 mL via INTRAVENOUS

## 2015-06-05 MED ORDER — HEPARIN SOD (PORK) LOCK FLUSH 100 UNIT/ML IV SOLN
500.0000 [IU] | Freq: Every day | INTRAVENOUS | Status: AC | PRN
  Administered 2015-06-05: 500 [IU]

## 2015-06-05 NOTE — Patient Instructions (Signed)
[place] at [facility] Discharge Instructions  RECOMMENDATIONS MADE BY THE CONSULTANT AND ANY TEST RESULTS WILL BE SENT TO YOUR REFERRING PHYSICIAN.  Two units of blood today as well as Neupogen.    Thank you for choosing [place] at [facility] to provide your oncology and hematology care.  To afford each patient quality time with our provider, please arrive at least 15 minutes before your scheduled appointment time.   Beginning January 23rd 2017 lab work for the [facility] will be done in the  Main lab at [facility] on 1st floor. If you have a lab appointment with the [facility] please come in thru the  Main Entrance and check in at the main information desk  You need to re-schedule your appointment should you arrive 10 or more minutes late.  We strive to give you quality time with our providers, and arriving late affects you and other patients whose appointments are after yours.  Also, if you no show three or more times for appointments you may be dismissed from the clinic at the providers discretion.     Again, thank you for choosing [facility].  Our hope is that these requests will decrease the amount of time that you wait before being seen by our physicians.       _____________________________________________________________  Should you have questions after your visit to [facility], please contact our office at [phone] between the hours of 8:30 a.m. and 4:30 p.m.  Voicemails left after 4:30 p.m. will not be returned until the following business day.  For prescription refill requests, have your pharmacy contact our office.         Resources For Cancer Patients and their Caregivers ? American Cancer Society: Can assist with transportation, wigs, general needs, runs Look Good Feel Better.        416-516-5325 ? Cancer Care: Provides financial assistance, online support groups,  medication/co-pay assistance.  1-800-813-HOPE (640)461-6503) ? [place] Assists [place] cancer patients and their families through emotional , educational and financial support.  5418461655 ? [place] DSS Where to apply for food stamps, Medicaid and utility assistance. 585-434-7296 ? RCATS: Transportation to medical appointments. 7375839248 ? Social Security Administration: May apply for disability if have a Stage IV cancer. [phone] ? [employer], Disability and Transit Services: Assists with nutrition, care and transit needs. [phone]

## 2015-06-05 NOTE — Progress Notes (Signed)
Patient tolerated transfusion well.  VSS.

## 2015-06-05 NOTE — Progress Notes (Signed)
Please see other encounter for documentation.   

## 2015-06-06 ENCOUNTER — Encounter (HOSPITAL_COMMUNITY): Payer: Self-pay

## 2015-06-06 ENCOUNTER — Encounter (HOSPITAL_BASED_OUTPATIENT_CLINIC_OR_DEPARTMENT_OTHER): Payer: Medicaid Other

## 2015-06-06 DIAGNOSIS — C155 Malignant neoplasm of lower third of esophagus: Secondary | ICD-10-CM

## 2015-06-06 DIAGNOSIS — Z5189 Encounter for other specified aftercare: Secondary | ICD-10-CM | POA: Diagnosis not present

## 2015-06-06 LAB — TYPE AND SCREEN
ABO/RH(D): O POS
Antibody Screen: NEGATIVE
Unit division: 0
Unit division: 0

## 2015-06-06 MED ORDER — FILGRASTIM 480 MCG/1.6ML IJ SOLN
480.0000 ug | Freq: Once | INTRAMUSCULAR | Status: AC
  Administered 2015-06-06: 480 ug via SUBCUTANEOUS
  Filled 2015-06-06: qty 1.6

## 2015-06-06 NOTE — Progress Notes (Signed)
Brent Franco presents today for injection per the provider's orders.  neupogen administration without incident; see MAR for injection details.  Patient tolerated procedure well and without incident.  No questions or complaints noted at this time.

## 2015-06-07 ENCOUNTER — Encounter (HOSPITAL_COMMUNITY): Payer: Medicaid Other

## 2015-06-07 DIAGNOSIS — C155 Malignant neoplasm of lower third of esophagus: Secondary | ICD-10-CM

## 2015-06-07 DIAGNOSIS — K229 Disease of esophagus, unspecified: Secondary | ICD-10-CM | POA: Diagnosis not present

## 2015-06-07 LAB — COMPREHENSIVE METABOLIC PANEL
ALBUMIN: 3.3 g/dL — AB (ref 3.5–5.0)
ALK PHOS: 90 U/L (ref 38–126)
ALT: 153 U/L — ABNORMAL HIGH (ref 17–63)
ANION GAP: 10 (ref 5–15)
AST: 107 U/L — ABNORMAL HIGH (ref 15–41)
BILIRUBIN TOTAL: 0.7 mg/dL (ref 0.3–1.2)
BUN: 8 mg/dL (ref 6–20)
CALCIUM: 8.5 mg/dL — AB (ref 8.9–10.3)
CO2: 25 mmol/L (ref 22–32)
Chloride: 101 mmol/L (ref 101–111)
Creatinine, Ser: 0.73 mg/dL (ref 0.61–1.24)
GFR calc Af Amer: >60 mL/min (ref >60)
GFR calc non Af Amer: >60 mL/min (ref >60)
GLUCOSE: 278 mg/dL — AB (ref 65–99)
Potassium: 4.1 mmol/L (ref 3.5–5.1)
Sodium: 136 mmol/L (ref 135–145)
TOTAL PROTEIN: 6.4 g/dL — AB (ref 6.5–8.1)

## 2015-06-07 LAB — CBC WITH DIFFERENTIAL/PLATELET
BASOS PCT: 0 %
Basophils Absolute: 0 10*3/uL (ref 0.0–0.1)
Eosinophils Absolute: 0.1 10*3/uL (ref 0.0–0.7)
Eosinophils Relative: 1 %
HEMATOCRIT: 33 % — AB (ref 39.0–52.0)
HEMOGLOBIN: 11.1 g/dL — AB (ref 13.0–17.0)
LYMPHS ABS: 0.6 10*3/uL — AB (ref 0.7–4.0)
LYMPHS PCT: 11 %
MCH: 30.2 pg (ref 26.0–34.0)
MCHC: 33.6 g/dL (ref 30.0–36.0)
MCV: 89.7 fL (ref 78.0–100.0)
MONOS PCT: 23 %
Monocytes Absolute: 1.3 10*3/uL — ABNORMAL HIGH (ref 0.1–1.0)
NEUTROS ABS: 3.8 10*3/uL (ref 1.7–7.7)
NEUTROS PCT: 65 %
Platelets: 100 10*3/uL — ABNORMAL LOW (ref 150–400)
RBC: 3.68 MIL/uL — ABNORMAL LOW (ref 4.22–5.81)
RDW: 18 % — ABNORMAL HIGH (ref 11.5–15.5)
WBC: 5.8 10*3/uL (ref 4.0–10.5)

## 2015-06-08 ENCOUNTER — Inpatient Hospital Stay (HOSPITAL_COMMUNITY): Payer: Medicaid Other

## 2015-06-10 NOTE — Assessment & Plan Note (Addendum)
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  Complicated by newly diagnosed Hepatitis C without treatment.  Oncology history updated.  His liver enzymes/function has maintained throughout treatment, HOWEVER, of late, his AST/ALT have climbed to 3x ULN.  I have forwarded results to Dr. Gala Romney (GI) and spoke with him on 06/08/2015.  He agrees to see the patient back this week in his clinic for further evaluation.  Oncologically, he has completed therapy.  His liver lesion needs further evaluation and diagnosis.  He was given a few days worth of Neupogen for Leukopenia.  Last week, he was also given 2 units of PRBCs.  I have refilled a few medications:  Prozac 20 mg  Oxycontin, to be filled on 06/18/2015  Roxicet to be filled on 06/18/2015  We know that he may have metastatic disease to liver (versus [place]).  Will not pursue an AFP at this time as it will be falsely elevated in the setting of active Hep C.  Patient will see GI and based upon their recommendations, we will make further medical oncology recommendations.  Return in ~ 3 weeks for follow-up.  He needs a break from all forms of chemotherapy at this time and therefore, aggressive pursuance of liver lesion is not indicated at this time.  Based upon GI recommendations, liver lesion biopsy may be indicated to help guide future medical oncology recommendations.  However, Hep C may be the patient's primary priority at this time given his recent decline in liver function tests.

## 2015-06-10 NOTE — Progress Notes (Signed)
[name], NP [place] [address]  Malignant neoplasm of lower third of esophagus ([place]) - Plan: oxyCODONE-acetaminophen (ROXICET) 5-325 MG tablet, oxyCODONE (OXYCONTIN) 20 mg 12 hr tablet  Depression - Plan: FLUoxetine (PROZAC) 20 MG capsule  Esophageal pain - Plan: oxyCODONE-acetaminophen (ROXICET) 5-325 MG tablet  Elevated transaminase level - Plan: oxyCODONE-acetaminophen (ROXICET) 5-325 MG tablet  Liver lesion - Plan: oxyCODONE-acetaminophen (ROXICET) 5-325 MG tablet  CURRENT THERAPY: Supportive care and surveillance per NCCN guidelines  INTERVAL HISTORY: Brent Franco 65 y.o. male returns for followup of poorly differentiated esophageal carcinoma (unable to identify squamous cell versus adenocarcinoma) with radiographic evidence of possible metastatic disease to liver and lymph nodes, with esophageal lesion beginning at 35 cm from incisors extending down to 39 cm; approximately 1 cm superior to GE junction.    Esophageal cancer ([place])   03/08/2015 Imaging CT chest at [facility]- abnormal adenopathy (hilar and mediastinal)   03/26/2015 Procedure EGD by Dr. Gala Romney with abnormal findings and biopsy   03/26/2015 Pathology Results Esophagus, biopsy - INVASIVE POORLY DIFFERENTIATED CARCINOMA, SEE COMMENT. - EXTENSIVE ULCERATION WITH FUNGAL ORGANISMS.   04/05/2015 PET scan Elongated distal esophageal mass is markedly hypermetabolic. Small mediastinal lymph nodes are not hypermetabolic. Single 10 mm gastrohepatic ligament lymph node which is hypermetabolic. Suspect a right hepatic lobe metastatic lesion.   04/15/2015 - 04/21/2015 Hospital Admission [facility]   04/30/2015 Imaging MRI abd- 1.5 cm hepatic lesion in segment 7, corresponding to the hypermetabolic lesion on CT, suspicious for metastasis. Two upper abdominal lymph nodes measuring up to 1.3 cm short axis, suspicious for nodal metastases.   04/30/2015 - 05/28/2015 Chemotherapy Weekly Carboplatin/Paclitaxel with XRT    -  05/31/2015 Radiation Therapy Dr. Isidore Moos   I personally reviewed and went over laboratory results with the patient.  The results are noted within this dictation.  Labs will be updated today.  Labs today confirm stability of liver function tests, but remain 3 x ULN.  Of note, I have contacted Dr. Gala Romney on 06/08/2015 and he confirms that he will get the patient in this week for further management of Hep C which we think is the cause of his abnormal liver tests.  His enzymes maintained throughout treatment.  He feels better since his PRBC transfusion.  He notes that he is slowly improving.  He denies any focal complaints.  He notes rare "throat" pain that usually resolves following sputum ejection.    He notes that he is eating.  His weight is up 2 lbs since last weight check on 3/27.  I have reviewed his current oncology status.  Past Medical History  Diagnosis Date  . COPD (chronic obstructive pulmonary disease) ([place])   . ETOH abuse     stopped 10 years ago  . Depression     suicide attempt 20 years ago  . Arthritis   . Asthma   . Viral hepatitis C   . Diabetes mellitus without complication ([place])   . Hypothyroidism   . Cancer ([place])     esophageal  . Pneumonia 02/2015  . Itching     both feet, known has athlet's feet now    has Chest pain; Elevated transaminase level; COPD (chronic obstructive pulmonary disease) ([place]); Depression; History of hepatitis C; Dysphagia; Encounter for screening colonoscopy; Mucosal abnormality of esophagus; History of colonic polyps; Diverticulosis of colon without hemorrhage; Esophageal cancer ([place]); Liver lesion; Sepsis ([place]); Pneumonia; Healthcare-associated pneumonia; and SOB (shortness of breath) on his problem list.  is allergic to demerol.  Current Outpatient Prescriptions on File Prior to Visit  Medication Sig Dispense Refill  . ACCU-CHEK AVIVA PLUS test strip 2 (two) times daily. use for testing  2  . acetaminophen (TYLENOL) 500 MG tablet Take  500 mg by mouth every 6 (six) hours as needed for fever.    Marland Kitchen albuterol (PROVENTIL HFA;VENTOLIN HFA) 108 (90 BASE) MCG/ACT inhaler Inhale 1 puff into the lungs every 6 (six) hours as needed for wheezing or shortness of breath.    . ALPRAZolam (XANAX) 1 MG tablet Take 1 tablet (1 mg total) by mouth every 8 (eight) hours as needed for anxiety. 90 tablet 1  . docusate sodium (COLACE) 100 MG capsule Take 100 mg by mouth 2 (two) times daily.  4  . HUMULIN R 100 UNIT/ML injection Inject 2-12 Units into the skin 2 (two) times daily as needed for high blood sugar. 151-200 = 2 units, 201-250 = 4 units, 251-300 = 6 units, 301-350 = 8 units, 351-400 = 10 units, 401-450 = 12 units.  2  . ipratropium-albuterol (DUONEB) 0.5-2.5 (3) MG/3ML SOLN INHALE CONTENTS OF 1 VIAL IN NEBULIZER EVERY 4 HOURS AS NEEDED FOR SHORTNESS OF BREATH.  3  . ketoconazole (NIZORAL) 2 % cream Apply 1 application topically 2 (two) times daily.  6  . lidocaine (XYLOCAINE) 2 % solution MIX 50/50 WITH WATER AND SWALLOW 10MLS 30 MINUTES BEFORE MEALS AND 30 MINUTES BEFORE BEDTIME  3  . lidocaine-prilocaine (EMLA) cream Apply a quarter size amount to port site 1 hour prior to chemo. Do not rub in. Cover with plastic wrap. 30 g 3  . meloxicam (MOBIC) 7.5 MG tablet Reported on 05/07/2015  6  . metFORMIN (GLUCOPHAGE) 500 MG tablet Take 500 mg by mouth 2 (two) times daily.  3  . mometasone (NASONEX) 50 MCG/ACT nasal spray Place 2 sprays into the nose daily as needed (congestion).     . ondansetron (ZOFRAN) 8 MG tablet Take 1 tablet (8 mg total) by mouth every 8 (eight) hours as needed for nausea or vomiting. 30 tablet 2  . Pseudoeph-Doxylamine-DM-APAP (NYQUIL MULTI-SYMPTOM PO) Take 1 tablet by mouth daily as needed (congestion/cold).     . SPIRIVA HANDIHALER 18 MCG inhalation capsule Place 1 puff into inhaler and inhale daily.  5  . sucralfate (CARAFATE) 1 g tablet Take 1 tablet by mouth 2 (two) times daily.  5  . nitroGLYCERIN (NITROSTAT) 0.4 MG SL  tablet Place 1 tablet (0.4 mg total) under the tongue every 5 (five) minutes as needed for chest pain. (Patient not taking: Reported on 06/11/2015) 25 tablet 3  . prochlorperazine (COMPAZINE) 10 MG tablet Take 1 tablet (10 mg total) by mouth every 6 (six) hours as needed for nausea or vomiting. (Patient not taking: Reported on 06/11/2015) 30 tablet 2   No current facility-administered medications on file prior to visit.    Past Surgical History  Procedure Laterality Date  . Other surgical history Left 1995    fatty tissue tumor right upper thigh  . Dental surgery N/A January 2016    "dentures and spurs"   . Tumor excision Left 1990    inner thigh  . Colonoscopy with propofol N/A 03/26/2015    Procedure: COLONOSCOPY WITH PROPOFOL;  Surgeon: Daneil Dolin, MD;  [facility];  Service: Endoscopy;  Laterality: N/A;  1100  . Esophagogastroduodenoscopy (egd) with propofol N/A 03/26/2015    Procedure: ESOPHAGOGASTRODUODENOSCOPY (EGD) WITH PROPOFOL;  Surgeon: Daneil Dolin, MD;  [facility];  Service: Endoscopy;  Laterality: N/A;  . Biopsy  03/26/2015    Procedure: BIOPSY;  Surgeon: Daneil Dolin, MD;  [facility];  Service: Endoscopy;;  esophageal mass  . Polypectomy  03/26/2015    Procedure: POLYPECTOMY;  Surgeon: Daneil Dolin, MD;  [facility];  Service: Endoscopy;;  polypectomy at ileocecal valve    Denies any headaches, dizziness, double vision, fevers, chills, night sweats, , diarrhea, constipation, chest pain, heart palpitations, shortness of breath, blood in stool, black tarry stool, urinary pain, urinary burning, urinary frequency, hematuria.   PHYSICAL EXAMINATION  ECOG PERFORMANCE STATUS: 1 - Symptomatic but completely ambulatory  Filed Vitals:   06/11/15 0825  BP: 95/70  Pulse: 103  Temp: 97.5 F (36.4 C)  Resp: 18     GENERAL:alert, no distress, comfortable, cooperative, smiling, and accompanied by his wife, [name]. SKIN: skin  color, texture, turgor are normal, no rashes or significant lesions HEAD: Normocephalic, No masses, lesions, tenderness or abnormalities EYES: normal, Conjunctiva are pink and non-injected EARS: External ears normal OROPHARYNX:lips, buccal mucosa, and tongue normal and mucous membranes are moist  NECK: supple, trachea midline LYMPH:  not examined BREAST:not examined LUNGS: clear to auscultation and percussion without wheezes, rales, or rhonchi. HEART: regular rate & rhythm, no murmurs, no gallops and S1 and S2 normal ABDOMEN:abdomen soft, non-tender, normal bowel sounds and no masses or organomegaly BACK: Back symmetric, no curvature. EXTREMITIES:less then 2 second capillary refill, no joint deformities, effusion, or inflammation, no skin discoloration, no cyanosis  NEURO: alert & oriented x 3 with fluent speech, no focal motor/sensory deficits   LABORATORY DATA: CBC    Component Value Date/Time   WBC 3.7* 06/11/2015 0755   RBC 4.00* 06/11/2015 0755   HGB 11.9* 06/11/2015 0755   HCT 35.8* 06/11/2015 0755   PLT 169 06/11/2015 0755   MCV 89.5 06/11/2015 0755   MCH 29.8 06/11/2015 0755   MCHC 33.2 06/11/2015 0755   RDW 17.2* 06/11/2015 0755   LYMPHSABS 0.8 06/11/2015 0755   MONOABS 1.2* 06/11/2015 0755   EOSABS 0.1 06/11/2015 0755   BASOSABS 0.1 06/11/2015 0755      Chemistry      Component Value Date/Time   NA 136 06/11/2015 0755   K 4.1 06/11/2015 0755   CL 103 06/11/2015 0755   CO2 26 06/11/2015 0755   BUN 9 06/11/2015 0755   CREATININE 0.66 06/11/2015 0755   CREATININE 0.78 01/18/2015 0951      Component Value Date/Time   CALCIUM 8.3* 06/11/2015 0755   ALKPHOS 88 06/11/2015 0755   AST 116* 06/11/2015 0755   ALT 139* 06/11/2015 0755   BILITOT 0.5 06/11/2015 0755       PENDING LABS:   RADIOGRAPHIC STUDIES:  No results found.   PATHOLOGY:    ASSESSMENT AND PLAN:  Esophageal cancer ([place]) Poorly differentiated esophageal carcinoma (unable to identify  squamous cell versus adenocarcinoma) with radiographic evidence of possible metastatic disease to liver and lymph nodes, with esophageal lesion beginning at 35 cm from incisors extending down to 39 cm; approximately 1 cm superior to GE junction.  Complicated by newly diagnosed Hepatitis C without treatment.  Oncology history updated.  His liver enzymes/function has maintained throughout treatment, HOWEVER, of late, his AST/ALT have climbed to 3x ULN.  I have forwarded results to Dr. Gala Romney (GI) and spoke with him on 06/08/2015.  He agrees to see the patient back this week in his clinic for further evaluation.  Oncologically, he has completed therapy.  His liver lesion needs further evaluation and diagnosis.  He was given a few days worth of Neupogen for Leukopenia.  Last week, he was also given 2 units of PRBCs.  I have refilled a few medications:  Prozac 20 mg  Oxycontin, to be filled on 06/18/2015  Roxicet to be filled on 06/18/2015  We know that he may have metastatic disease to liver (versus [place]).  Will not pursue an AFP at this time as it will be falsely elevated in the setting of active Hep C.  Patient will see GI and based upon their recommendations, we will make further medical oncology recommendations.  Return in ~ 3 weeks for follow-up.  He needs a break from all forms of chemotherapy at this time and therefore, aggressive pursuance of liver lesion is not indicated at this time.  Based upon GI recommendations, liver lesion biopsy may be indicated to help guide future medical oncology recommendations.  However, Hep C may be the patient's primary priority at this time given his recent decline in liver function tests.    THERAPY PLAN:  He will be seen by GI this week for evaluation/management/recommendation(s) regarding Hep C.  Medical oncology recommendations will follow.  His liver lesion will need addressed but we will await until after his Hep C evaluation.  He needs a break from therapy at  this time.  All questions were answered. The patient knows to call the clinic with any problems, questions or concerns. We can certainly see the patient much sooner if necessary.  Patient and plan discussed with Dr. Ancil Linsey and she is in agreement with the aforementioned.   This note is electronically signed by: Doy Mince 06/11/2015 10:41 AM

## 2015-06-11 ENCOUNTER — Inpatient Hospital Stay (HOSPITAL_COMMUNITY): Payer: Medicaid Other

## 2015-06-11 ENCOUNTER — Encounter (HOSPITAL_COMMUNITY): Payer: Self-pay | Admitting: Oncology

## 2015-06-11 ENCOUNTER — Encounter (HOSPITAL_COMMUNITY): Payer: Medicaid Other

## 2015-06-11 ENCOUNTER — Ambulatory Visit (HOSPITAL_COMMUNITY): Payer: Medicaid Other | Admitting: Hematology & Oncology

## 2015-06-11 ENCOUNTER — Encounter (HOSPITAL_COMMUNITY): Payer: Medicaid Other | Attending: Hematology & Oncology | Admitting: Oncology

## 2015-06-11 VITALS — BP 95/70 | HR 103 | Temp 97.5°F | Resp 18 | Wt 170.2 lb

## 2015-06-11 DIAGNOSIS — K769 Liver disease, unspecified: Secondary | ICD-10-CM

## 2015-06-11 DIAGNOSIS — C155 Malignant neoplasm of lower third of esophagus: Secondary | ICD-10-CM

## 2015-06-11 DIAGNOSIS — G893 Neoplasm related pain (acute) (chronic): Secondary | ICD-10-CM | POA: Diagnosis not present

## 2015-06-11 DIAGNOSIS — K7689 Other specified diseases of liver: Secondary | ICD-10-CM | POA: Insufficient documentation

## 2015-06-11 DIAGNOSIS — R740 Nonspecific elevation of levels of transaminase and lactic acid dehydrogenase [LDH]: Secondary | ICD-10-CM | POA: Diagnosis not present

## 2015-06-11 DIAGNOSIS — F329 Major depressive disorder, single episode, unspecified: Secondary | ICD-10-CM

## 2015-06-11 DIAGNOSIS — K228 Other specified diseases of esophagus: Secondary | ICD-10-CM

## 2015-06-11 DIAGNOSIS — K229 Disease of esophagus, unspecified: Secondary | ICD-10-CM | POA: Diagnosis present

## 2015-06-11 DIAGNOSIS — R7401 Elevation of levels of liver transaminase levels: Secondary | ICD-10-CM

## 2015-06-11 DIAGNOSIS — F32A Depression, unspecified: Secondary | ICD-10-CM

## 2015-06-11 DIAGNOSIS — K2289 Other specified disease of esophagus: Secondary | ICD-10-CM

## 2015-06-11 LAB — COMPREHENSIVE METABOLIC PANEL
ALBUMIN: 3.3 g/dL — AB (ref 3.5–5.0)
ALK PHOS: 88 U/L (ref 38–126)
ALT: 139 U/L — AB (ref 17–63)
ANION GAP: 7 (ref 5–15)
AST: 116 U/L — AB (ref 15–41)
BILIRUBIN TOTAL: 0.5 mg/dL (ref 0.3–1.2)
BUN: 9 mg/dL (ref 6–20)
CALCIUM: 8.3 mg/dL — AB (ref 8.9–10.3)
CO2: 26 mmol/L (ref 22–32)
Chloride: 103 mmol/L (ref 101–111)
Creatinine, Ser: 0.66 mg/dL (ref 0.61–1.24)
GFR calc Af Amer: >60 mL/min (ref >60)
GFR calc non Af Amer: >60 mL/min (ref >60)
GLUCOSE: 202 mg/dL — AB (ref 65–99)
Potassium: 4.1 mmol/L (ref 3.5–5.1)
SODIUM: 136 mmol/L (ref 135–145)
TOTAL PROTEIN: 6.7 g/dL (ref 6.5–8.1)

## 2015-06-11 LAB — CBC WITH DIFFERENTIAL/PLATELET
BASOS ABS: 0.1 10*3/uL (ref 0.0–0.1)
BASOS PCT: 2 %
EOS PCT: 3 %
Eosinophils Absolute: 0.1 10*3/uL (ref 0.0–0.7)
HEMATOCRIT: 35.8 % — AB (ref 39.0–52.0)
Hemoglobin: 11.9 g/dL — ABNORMAL LOW (ref 13.0–17.0)
Lymphocytes Relative: 20 %
Lymphs Abs: 0.8 10*3/uL (ref 0.7–4.0)
MCH: 29.8 pg (ref 26.0–34.0)
MCHC: 33.2 g/dL (ref 30.0–36.0)
MCV: 89.5 fL (ref 78.0–100.0)
MONO ABS: 1.2 10*3/uL — AB (ref 0.1–1.0)
Monocytes Relative: 32 %
NEUTROS ABS: 1.6 10*3/uL — AB (ref 1.7–7.7)
Neutrophils Relative %: 42 %
Platelets: 169 10*3/uL (ref 150–400)
RBC: 4 MIL/uL — AB (ref 4.22–5.81)
RDW: 17.2 % — ABNORMAL HIGH (ref 11.5–15.5)
WBC: 3.7 10*3/uL — AB (ref 4.0–10.5)

## 2015-06-11 MED ORDER — OXYCODONE HCL ER 20 MG PO T12A: 20.0000 mg | EXTENDED_RELEASE_TABLET | Freq: Three times a day (TID) | ORAL | Status: DC

## 2015-06-11 MED ORDER — OXYCODONE-ACETAMINOPHEN 5-325 MG PO TABS: 1.0000 | ORAL_TABLET | Freq: Four times a day (QID) | ORAL | Status: DC | PRN

## 2015-06-11 MED ORDER — FLUOXETINE HCL 20 MG PO CAPS: 20.0000 mg | ORAL_CAPSULE | Freq: Every day | ORAL | Status: AC

## 2015-06-11 NOTE — Patient Instructions (Signed)
[place] at [facility] Discharge Instructions  RECOMMENDATIONS MADE BY THE CONSULTANT AND ANY TEST RESULTS WILL BE SENT TO YOUR REFERRING PHYSICIAN.  Labs most recently demonstrate an elevation in your liver tests.  We think this may be from your Hep C.  I have discussed your case with Dr. Gala Romney on Friday, 06/08/2015 and he has agreed to see you this week.  Please call us if you do not hear from his office by Tuesday. I have electronically prescribed a refill on your Prozac.  This was sent to your pharmacy. I have also refilled your pain medication.  These can be filled on 06/18/2015. Return in 3 weeks for follow-up.  Sooner if needed.  Thank you for choosing [place] at [facility] to provide your oncology and hematology care.  To afford each patient quality time with our provider, please arrive at least 15 minutes before your scheduled appointment time.   Beginning January 23rd 2017 lab work for the [facility] will be done in the  Main lab at [facility] on 1st floor. If you have a lab appointment with the [place] please come in thru the  Main Entrance and check in at the main information desk  You need to re-schedule your appointment should you arrive 10 or more minutes late.  We strive to give you quality time with our providers, and arriving late affects you and other patients whose appointments are after yours.  Also, if you no show three or more times for appointments you may be dismissed from the clinic at the providers discretion.     Again, thank you for choosing [facility].  Our hope is that these requests will decrease the amount of time that you wait before being seen by our physicians.       _____________________________________________________________  Should you have questions after your visit to [facility], please contact our office at [phone] between the hours of 8:30 a.m. and 4:30 p.m.   Voicemails left after 4:30 p.m. will not be returned until the following business day.  For prescription refill requests, have your pharmacy contact our office.         Resources For Cancer Patients and their Caregivers ? American Cancer Society: Can assist with transportation, wigs, general needs, runs Look Good Feel Better.        662-202-8778 ? Cancer Care: Provides financial assistance, online support groups, medication/co-pay assistance.  1-800-813-HOPE 541-843-5747) ? [place] Assists [place] cancer patients and their families through emotional , educational and financial support.  ([phone] ? [place] DSS Where to apply for food stamps, Medicaid and utility assistance. 470-048-9201 ? RCATS: Transportation to medical appointments. 9040329221 ? Social Security Administration: May apply for disability if have a Stage IV cancer. [phone] ? [employer], Disability and Transit Services: Assists with nutrition, care and transit needs. [phone]

## 2015-06-12 ENCOUNTER — Telehealth: Payer: Self-pay

## 2015-06-12 NOTE — Telephone Encounter (Signed)
Pt wife called and states that the Cancer center doctor told them to call us to set up an appointment this week for pt. States that [facility] spoke with the cancer center ands wants pt seen ASAP.  Please advise

## 2015-06-12 NOTE — Telephone Encounter (Signed)
Pt is coming at 0830 on 04/05

## 2015-06-12 NOTE — Telephone Encounter (Signed)
That is true. I sent a staff message to someone over the weekend. He needs to be seen this week by an extender

## 2015-06-12 NOTE — Telephone Encounter (Signed)
Talked with daughter and she will get mother to call us back. We have a spot held for him in the morning at 8:30

## 2015-06-13 ENCOUNTER — Encounter: Payer: Self-pay | Admitting: Nurse Practitioner

## 2015-06-13 ENCOUNTER — Ambulatory Visit (INDEPENDENT_AMBULATORY_CARE_PROVIDER_SITE_OTHER): Payer: Medicaid Other | Admitting: Nurse Practitioner

## 2015-06-13 VITALS — BP 108/67 | HR 125 | Temp 98.3°F | Ht 73.0 in | Wt 168.2 lb

## 2015-06-13 DIAGNOSIS — K7689 Other specified diseases of liver: Secondary | ICD-10-CM

## 2015-06-13 DIAGNOSIS — B182 Chronic viral hepatitis C: Secondary | ICD-10-CM | POA: Diagnosis not present

## 2015-06-13 DIAGNOSIS — B1920 Unspecified viral hepatitis C without hepatic coma: Secondary | ICD-10-CM | POA: Insufficient documentation

## 2015-06-13 DIAGNOSIS — K769 Liver disease, unspecified: Secondary | ICD-10-CM

## 2015-06-13 NOTE — Progress Notes (Signed)
cc'ed to pcp °

## 2015-06-13 NOTE — Assessment & Plan Note (Signed)
Positive hepatitis C genotype 1A. 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 Now being seen by oncology for esophageal cancer with likely metastases to liver and lymph nodes. He is on a break from chemotherapy and radiation and oncology has referred him back to [place] to evaluate for hep C treatment as well as regarding liver lesion. He is doing quite well with oncology treatments. They feel his slight bump in liver enzymes is likely due to hepatitis C and are interested in eradicating this. They're questioning whether metastasis versus hepatocellular carcinoma as etiology to his liver lesion, although they mention no urgency to aggressively pursue the liver lesion at this time. MRI with Gretta Cool has been completed by oncology which demonstrates likely lesion due to metastasis. Attempt to call radiologist to review images but with no answer. We'll continue to call to confirm that lesion is not likely hepatocellular carcinoma.  Review of drug information for Harvoni does not reveal any contraindications for treatment with ongoing cancer. Spoke with the drug representative from [facility] regarding contraindications with hepatitis C treatment with Harvoni given esophageal cancer. She states she is not aware of any contraindications either and personally knows of several patients with other providers with various forms of cancer currently undergoing treatment. However, she will request the medical scientist from their company to send me related information regarding this.  I have completed the paperwork to request his insurance authorization for her bony treatment for hepatitis C. All lab work and imaging has been completed. No alcohol in 2 years (had one drink 2 years ago) and prior to this no alcohol in the previous 15 years. No recreational drugs.

## 2015-06-13 NOTE — Assessment & Plan Note (Signed)
Liver lesion questionable metastasis versus hepatocellular carcinoma. MRI with and without contrast completed and mentioned likely metastasis. Attempt to contact radiologist to review images with no answer on their line. I will continue to call to review these images and confirm likely metastasis rather than hepatocellular carcinoma. Further recommendations to follow although likely no urgent need for liver biopsy at this time.

## 2015-06-13 NOTE — Progress Notes (Signed)
Referring Provider: [name], NP Primary Care Physician:  [name], NP Primary GI:  Dr. Gala Romney  Chief Complaint  Patient presents with  . Hepatitis C    HPI:   Brent Franco is a 65 y.o. male who presents For follow-up on hepatitis C status. The patient has had an unfortunate recent course. He was initially seen by our office on 01/18/2015 on referral from primary care for positive hep C antibody, and states he was initially diagnosed 25-30 years ago, treatment with interferon treatment deferred. Appropriate workup was ordered to confirm hep C positive status and genotype as well as ultrasound elastography that noted fibrosis score of F2 and some F3. He was seen back in 1 month later for complaints of dysphagia as well as need for colonoscopy. He was in his endoscopy that a bulky esophageal tumor was noted and biopsies taken and review of previous chest CT from another facility noted "esophageal prominence" deemed possible collapsed hiatal hernia and 1 cm lymph nodes in the area. He was referred urgently to oncology.   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 He is undergone his first round of chemotherapy and oncology has referred him back to [place] for management of hepatitis C while he is taking a break. Liver labs are stable but 3 times upper limit normal likely due to viral hepatitis. Previous labs done and demonstrated hepatitis C genotype 1A. Last metabolic panel show AST/ALT elevated at 116/139 which is slightly more elevated than his baseline.  Today he states he's doing ok. He is on a break from chemotherapy. He states oncology told him he has far exceeded their expectations for chemotherapy tolerance. He has finished radiation. Is tired this morning because couldn't sleep well last  night. Denies yellowing of skin and eyes, acute episodic confusion, darkened urine, generalized pruritis. Denies any alcohol in over 2 years, no drug use. Admits some dyspnea on exertion, told it's a side effect of cancer treatment. Denies dysphagia, occasional burnign in his throat. Denies chest pain, dizziness, lightheadedness, syncope, near syncope. Denies any other upper or lower GI symptoms.    Past Medical History  Diagnosis Date  . COPD (chronic obstructive pulmonary disease) ([place])   . ETOH abuse     stopped 10 years ago  . Depression     suicide attempt 20 years ago  . Arthritis   . Asthma   . Viral hepatitis C   . Diabetes mellitus without complication ([place])   . Hypothyroidism   . Cancer ([place])     esophageal  . Pneumonia 02/2015  . Itching     both feet, known has athlet's feet now    Past Surgical History  Procedure Laterality Date  . Other surgical history Left 1995    fatty tissue tumor right upper thigh  . Dental surgery N/A January 2016    "dentures and spurs"   . Tumor excision Left 1990    inner thigh  . Colonoscopy with propofol N/A 03/26/2015    Procedure: COLONOSCOPY WITH PROPOFOL;  Surgeon: Daneil Dolin, MD;  [facility];  Service: Endoscopy;  Laterality: N/A;  1100  . Esophagogastroduodenoscopy (egd) with propofol N/A 03/26/2015    Procedure: ESOPHAGOGASTRODUODENOSCOPY (EGD) WITH PROPOFOL;  Surgeon: Daneil Dolin, MD;  [facility];  Service: Endoscopy;  Laterality:  N/A;  . Biopsy  03/26/2015    Procedure: BIOPSY;  Surgeon: Daneil Dolin, MD;  [facility];  Service: Endoscopy;;  esophageal mass  . Polypectomy  03/26/2015    Procedure: POLYPECTOMY;  Surgeon: Daneil Dolin, MD;  [facility];  Service: Endoscopy;;  polypectomy at ileocecal valve    Current Outpatient Prescriptions  Medication Sig Dispense Refill  . ACCU-CHEK AVIVA PLUS test strip 2 (two) times daily. use for testing  2  . acetaminophen  (TYLENOL) 500 MG tablet Take 500 mg by mouth every 6 (six) hours as needed for fever.    Marland Kitchen albuterol (PROVENTIL HFA;VENTOLIN HFA) 108 (90 BASE) MCG/ACT inhaler Inhale 1 puff into the lungs every 6 (six) hours as needed for wheezing or shortness of breath.    . ALPRAZolam (XANAX) 1 MG tablet Take 1 tablet (1 mg total) by mouth every 8 (eight) hours as needed for anxiety. 90 tablet 1  . docusate sodium (COLACE) 100 MG capsule Take 100 mg by mouth 2 (two) times daily.  4  . FLUoxetine (PROZAC) 20 MG capsule Take 1 capsule (20 mg total) by mouth daily. 30 capsule 3  . HUMULIN R 100 UNIT/ML injection Inject 2-12 Units into the skin 2 (two) times daily as needed for high blood sugar. 151-200 = 2 units, 201-250 = 4 units, 251-300 = 6 units, 301-350 = 8 units, 351-400 = 10 units, 401-450 = 12 units.  2  . ipratropium-albuterol (DUONEB) 0.5-2.5 (3) MG/3ML SOLN INHALE CONTENTS OF 1 VIAL IN NEBULIZER EVERY 4 HOURS AS NEEDED FOR SHORTNESS OF BREATH.  3  . ketoconazole (NIZORAL) 2 % cream Apply 1 application topically 2 (two) times daily.  6  . lidocaine (XYLOCAINE) 2 % solution MIX 50/50 WITH WATER AND SWALLOW 10MLS 30 MINUTES BEFORE MEALS AND 30 MINUTES BEFORE BEDTIME  3  . lidocaine-prilocaine (EMLA) cream Apply a quarter size amount to port site 1 hour prior to chemo. Do not rub in. Cover with plastic wrap. 30 g 3  . meloxicam (MOBIC) 7.5 MG tablet Reported on 05/07/2015  6  . metFORMIN (GLUCOPHAGE) 500 MG tablet Take 500 mg by mouth 2 (two) times daily.  3  . mometasone (NASONEX) 50 MCG/ACT nasal spray Place 2 sprays into the nose daily as needed (congestion).     . nitroGLYCERIN (NITROSTAT) 0.4 MG SL tablet Place 1 tablet (0.4 mg total) under the tongue every 5 (five) minutes as needed for chest pain. 25 tablet 3  . ondansetron (ZOFRAN) 8 MG tablet Take 1 tablet (8 mg total) by mouth every 8 (eight) hours as needed for nausea or vomiting. 30 tablet 2  . oxyCODONE (OXYCONTIN) 20 mg 12 hr tablet Take 1 tablet  (20 mg total) by mouth every 8 (eight) hours. 90 tablet 0  . oxyCODONE-acetaminophen (ROXICET) 5-325 MG tablet Take 1-2 tablets by mouth every 6 (six) hours as needed for moderate pain or severe pain. 60 tablet 0  . prochlorperazine (COMPAZINE) 10 MG tablet Take 1 tablet (10 mg total) by mouth every 6 (six) hours as needed for nausea or vomiting. 30 tablet 2  . SPIRIVA HANDIHALER 18 MCG inhalation capsule Place 1 puff into inhaler and inhale daily.  5  . sucralfate (CARAFATE) 1 g tablet Take 1 tablet by mouth 2 (two) times daily.  5   No current facility-administered medications for this visit.    Allergies as of 06/13/2015 - Review Complete 06/13/2015  Allergen Reaction Noted  . Demerol [meperidine]  Rash 09/12/2014    Family History  Problem Relation Age of Onset  . Pancreatic cancer Mother   . Lung cancer Father   . Colon cancer Neg Hx     Social History   Social History  . Marital Status: Married    Spouse Name: N/A  . Number of Children: N/A  . Years of Education: N/A   Social History Main Topics  . Smoking status: Former Smoker -- 1.50 packs/day for 47 years    Types: Cigarettes    Start date: 12/03/1966    Quit date: 12/08/2013  . Smokeless tobacco: Never Used  . Alcohol Use: No     Comment: Quit 2006: previously drank heavily. Had 1 drink 2-3 years ago (2013-2014)  . Drug Use: No     Comment: Previous IV drug use as teenager, none in 40 years. Last marijuanna use 02/22/15.  Marland Kitchen Sexual Activity: Yes   Other Topics Concern  . None   Social History Narrative    Review of Systems: General: Negative for anorexia, weight loss, fever, chills. Admits fatigue, some insomnia, some weakness. ENT: Negative for hoarseness, difficulty swallowing. CV: Negative for chest pain, angina, palpitations, peripheral edema.  Respiratory: Negative for dyspnea at rest, cough, wheezing. Admits a couple episodes of coughing up clear mucus likely from treatment.  GI: See history of  present illness. Endo: Negative for unusual weight change.  Heme: Negative for bruising or bleeding. Allergy: Negative for rash or hives.   Physical Exam: BP 108/67 mmHg  Pulse 125  Temp(Src) 98.3 F (36.8 C) (Oral)  Ht 6\' 1"  (1.854 m)  Wt 168 lb 3.2 oz (76.295 kg)  BMI 22.20 kg/m2 General:   Alert and oriented. Pleasant and cooperative. Well-nourished and well-developed. Appears fatigued. Head:  Normocephalic and atraumatic. Eyes:  Without icterus, sclera clear and conjunctiva pink.  Ears:  Normal auditory acuity. Cardiovascular:  S1, S2 present without murmurs appreciated. Extremities without clubbing or edema. Respiratory:  Clear to auscultation bilaterally. No wheezes, rales, or rhonchi. No distress.  Gastrointestinal:  +BS, soft, non-tender and non-distended. No guarding or rebound. Rectal:  Deferred  Neurologic:  Alert and oriented x4;  grossly normal neurologically. Psych:  Alert and cooperative. Normal mood and affect. Heme/Lymph/Immune: No excessive bruising noted.    06/13/2015 10:50 AM   Disclaimer: This note was dictated with voice recognition software. Similar sounding words can inadvertently be transcribed and may not be corrected upon review.

## 2015-06-13 NOTE — Patient Instructions (Signed)
1. We will submit for Harvoni treatment 2. Return for follow-up in 4 weeks for follow-up 3. We may call you sooner based on response from [facility] about the Harvoni treatment. 4. Call us with any problems before then.

## 2015-06-13 NOTE — Progress Notes (Signed)
MRI of the abdomen w/ and w/o contrast has recently been completed. Called radiologist at [facility] who referred me to the radiologist who read the MRI (Dr. Maryland Pink). Reviewed the study with him over telephone. States without AFP , more difficult to discern METS vs HCC. However, the lesion was not seen on CT 03/01/15 which supports METS< PET imaging of this lesion not typical of [place], and washout/other image enhancement characteristics of this lesion not classic for [place]. 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  States if his chemotherapy improves the other cancer and NOT the liver lesion, could consider liver biopsy at that time.

## 2015-06-15 ENCOUNTER — Inpatient Hospital Stay (HOSPITAL_COMMUNITY): Payer: Medicaid Other

## 2015-06-18 ENCOUNTER — Telehealth: Payer: Self-pay | Admitting: Internal Medicine

## 2015-06-18 ENCOUNTER — Telehealth: Payer: Self-pay | Admitting: Nurse Practitioner

## 2015-06-18 ENCOUNTER — Telehealth: Payer: Self-pay

## 2015-06-18 NOTE — Telephone Encounter (Signed)
Spoke with [employer] Friday 06/15/15 about the patient's case. States there are no absolute contraindications to treating Hep C with harvoni, some may question the utility. There was a cohort study conducted (not a controlled trial) that showed some reduction in SVR with treatment in conjunction with HCC (treatment response upper 80s versus mid to upper 90s. 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   Will continue with the prior auth process for Harvoni.

## 2015-06-18 NOTE — Telephone Encounter (Signed)
Noted  

## 2015-06-18 NOTE — Telephone Encounter (Signed)
Noted. When we have his medication delivered and his picks it up, please schedule 4 week OV for labs to be drawn. Stress the importance of keeping this appointment.  Checked for interaction with Harvoni with his currnet meds and none found.   Will need to make sure he takes medication at the same time every day. Do not take a PPI while taking Harvoni. If needs acid blocker, call us for instructions. Do not take any medications (prescription, OTC, herbal, or otherwise).   Call if any questions.

## 2015-06-18 NOTE — Telephone Encounter (Signed)
Spoke with the pts wife and answered all her questions. She is aware that rx should be delivered to our office but if they get it at home by mistake they will call and let me know that they have it.

## 2015-06-18 NOTE — Telephone Encounter (Signed)
WIfe called with multiple questions regarding patient taking Harvoni. Please call her back at [phone]

## 2015-06-18 NOTE — Telephone Encounter (Signed)
Received a fax from [employer], pt has been approved for harvoni. His approval is only for 8 weeks, he will need viral load at 4 weeks to get approval for all 12 weeks. His medication has not been delivered yet.

## 2015-06-19 NOTE — Telephone Encounter (Signed)
APPT MADE AND ATTACHED TO MEDICATION TO BE PICKED UP

## 2015-06-19 NOTE — Telephone Encounter (Signed)
Instruction letter done. Tried to call pt- [id number] with information. Asked them to come by and pick it up. Medication is at the front desk.  Please schedule ov in 4 weeks.

## 2015-06-22 ENCOUNTER — Encounter: Payer: Self-pay | Admitting: Dietician

## 2015-06-22 NOTE — Progress Notes (Signed)
Following up with pt in light of loss of ~9 lbs since first contact 2.5 months ago.   Contacted Pt by Phone  Wt Readings from Last 10 Encounters:  06/13/15 168 lb 3.2 oz (76.295 kg)  06/11/15 170 lb 3.2 oz (77.202 kg)  06/04/15 168 lb 3.2 oz (76.295 kg)  05/28/15 166 lb (75.297 kg)  05/21/15 168 lb (76.204 kg)  05/14/15 176 lb (79.833 kg)  05/07/15 177 lb 11.2 oz (80.604 kg)  04/26/15 179 lb (81.194 kg)  04/18/15 185 lb 6.5 oz (84.1 kg)  04/03/15 178 lb (80.74 kg)   Patient weight appears to have plateaued at 168 lbs.   Was unable to reach patient. Left voicemail w/ contact information in case pt or wife wanted to discuss nutrition.   Most recent Oncology note states he "is eating". Pt's treatment plan has been complicated by recent  Hep C.   Per GI note, pt has had hep c for 20-25 years and has well compensated cirrhosis. Now pursuing treatment for this.   Burtis Junes RD, LDN Nutrition Pager: [phone] 06/22/2015 2:52 PM

## 2015-07-02 ENCOUNTER — Other Ambulatory Visit (HOSPITAL_COMMUNITY): Payer: Self-pay | Admitting: Hematology & Oncology

## 2015-07-04 ENCOUNTER — Encounter (HOSPITAL_COMMUNITY): Payer: Self-pay | Admitting: Hematology & Oncology

## 2015-07-04 ENCOUNTER — Encounter (HOSPITAL_BASED_OUTPATIENT_CLINIC_OR_DEPARTMENT_OTHER): Payer: Medicaid Other | Admitting: Hematology & Oncology

## 2015-07-04 VITALS — BP 112/75 | HR 101 | Resp 16 | Wt 169.1 lb

## 2015-07-04 DIAGNOSIS — C155 Malignant neoplasm of lower third of esophagus: Secondary | ICD-10-CM

## 2015-07-04 DIAGNOSIS — C159 Malignant neoplasm of esophagus, unspecified: Secondary | ICD-10-CM

## 2015-07-04 DIAGNOSIS — K7689 Other specified diseases of liver: Secondary | ICD-10-CM | POA: Diagnosis not present

## 2015-07-04 DIAGNOSIS — F329 Major depressive disorder, single episode, unspecified: Secondary | ICD-10-CM

## 2015-07-04 DIAGNOSIS — B182 Chronic viral hepatitis C: Secondary | ICD-10-CM

## 2015-07-04 DIAGNOSIS — K769 Liver disease, unspecified: Secondary | ICD-10-CM

## 2015-07-04 NOTE — Patient Instructions (Addendum)
[place] at [facility] Discharge Instructions  RECOMMENDATIONS MADE BY THE CONSULTANT AND ANY TEST RESULTS WILL BE SENT TO YOUR REFERRING PHYSICIAN.    Exam and discussion by Dr Whitney Muse today PET scan in June Labs in 4 weeks Port flush in 4 weeks Return to see the doctor in 4 weeks  Please call the clinic if you have any questions or concerns    Thank you for choosing [place] at [facility] to provide your oncology and hematology care.  To afford each patient quality time with our provider, please arrive at least 15 minutes before your scheduled appointment time.   Beginning January 23rd 2017 lab work for the [facility] will be done in the  Main lab at [facility] on 1st floor. If you have a lab appointment with the [facility] please come in thru the  Main Entrance and check in at the main information desk  You need to re-schedule your appointment should you arrive 10 or more minutes late.  We strive to give you quality time with our providers, and arriving late affects you and other patients whose appointments are after yours.  Also, if you no show three or more times for appointments you may be dismissed from the clinic at the providers discretion.     Again, thank you for choosing [facility].  Our hope is that these requests will decrease the amount of time that you wait before being seen by our physicians.       _____________________________________________________________  Should you have questions after your visit to [facility], please contact our office at [phone] between the hours of 8:30 a.m. and 4:30 p.m.  Voicemails left after 4:30 p.m. will not be returned until the following business day.  For prescription refill requests, have your pharmacy contact our office.         Resources For Cancer Patients and their Caregivers ? American Cancer Society: Can assist with transportation,  wigs, general needs, runs Look Good Feel Better.        802-479-1092 ? Cancer Care: Provides financial assistance, online support groups, medication/co-pay assistance.  1-800-813-HOPE 661-856-3864) ? [facility] Assists [place] cancer patients and their families through emotional , educational and financial support.  641-875-5469 ? [place] DSS Where to apply for food stamps, Medicaid and utility assistance. 650-539-8800 ? [facility]: Transportation to medical appointments. (209)263-6392 ? Social Security Administration: May apply for disability if have a Stage IV cancer. (763)369-4988 (581)805-6545 ? [employer], Disability and Transit Services: Assists with nutrition, care and transit needs. 7173176143

## 2015-07-04 NOTE — Progress Notes (Signed)
[facility] Note  Patient Care Team: [name], NP as PCP - General (Nurse Practitioner) Daneil Dolin, MD as Consulting Physician (Gastroenterology)  CHIEF COMPLAINTS:  EGD 03/26/2015 with bulky esophageal tumor involving the distal esophagus encroaching significantly upon the lumen. Lesion begins at 35cm from the incisors and extends down to 39 cm approximately 1 cm above the GE junction. No varices were noted, no portal gastropathy noted.  Pathology with invasive poorly differentiated carcinoma, extensive ulceration with fungal organisms (c/w candida)    Esophageal cancer ([place])   03/08/2015 Imaging CT chest at [facility]- abnormal adenopathy (hilar and mediastinal)   03/26/2015 Procedure EGD by Dr. Gala Romney with abnormal findings and biopsy   03/26/2015 Pathology Results Esophagus, biopsy - INVASIVE POORLY DIFFERENTIATED CARCINOMA, SEE COMMENT. - EXTENSIVE ULCERATION WITH FUNGAL ORGANISMS.   04/05/2015 PET scan Elongated distal esophageal mass is markedly hypermetabolic. Small mediastinal lymph nodes are not hypermetabolic. Single 10 mm gastrohepatic ligament lymph node which is hypermetabolic. Suspect a right hepatic lobe metastatic lesion.   04/15/2015 - 04/21/2015 Hospital Admission [facility]   04/24/2015 - 05/31/2015 Radiation Therapy Dr. Isidore Moos.  45Gy in 25 fractions to distal esophagus and lymph nodes and 5.4 Gy in 3 fractions to esophagus and lymph node boost   04/30/2015 Imaging MRI abd- 1.5 cm hepatic lesion in segment 7, corresponding to the hypermetabolic lesion on CT, suspicious for metastasis. Two upper abdominal lymph nodes measuring up to 1.3 cm short axis, suspicious for nodal metastases.   04/30/2015 - 05/28/2015 Chemotherapy Weekly Carboplatin/Paclitaxel with XRT   07/02/2015 Miscellaneous Harvoni for hepatitis C    HISTORY OF PRESENTING ILLNESS:  Brent Franco 65 y.o. male is here for follow-up of esophageal carcinoma. He has completed concurrent  therapy.   Brent Franco is accompanied by his wife. He has started taking Harvoni.  His wife reports he has been eating better. The patient admits that since starting the Harvoni his appetite has decreased. He only eats when he feels like it. He is able to keep food down when he eats. Denies vomiting.   His wife notes he seems more depressed at times, though his mood is up and down. She recalls a recent dinner where he laughed and said it was the hardest he had laughed in a year. He has been able to mow the yard lately. No other major complaints or concerns. They saw Dr. Isidore Moos yesterday.     MEDICAL HISTORY:  Past Medical History  Diagnosis Date  . COPD (chronic obstructive pulmonary disease) ([place])   . ETOH abuse     stopped 10 years ago  . Depression     suicide attempt 20 years ago  . Arthritis   . Asthma   . Viral hepatitis C   . Diabetes mellitus without complication ([place])   . Hypothyroidism   . Cancer ([place])     esophageal  . Pneumonia 02/2015  . Itching     both feet, known has athlet's feet now    SURGICAL HISTORY: Past Surgical History  Procedure Laterality Date  . Other surgical history Left 1995    fatty tissue tumor right upper thigh  . Dental surgery N/A January 2016    "dentures and spurs"   . Tumor excision Left 1990    inner thigh  . Colonoscopy with propofol N/A 03/26/2015    Procedure: COLONOSCOPY WITH PROPOFOL;  Surgeon: Daneil Dolin, MD;  [facility];  Service: Endoscopy;  Laterality: N/A;  1100  . Esophagogastroduodenoscopy (egd) with propofol N/A 03/26/2015    Procedure: ESOPHAGOGASTRODUODENOSCOPY (EGD) WITH PROPOFOL;  Surgeon: Daneil Dolin, MD;  Location: [facility];  Service: Endoscopy;  Laterality: N/A;  . Biopsy  03/26/2015    Procedure: BIOPSY;  Surgeon: Daneil Dolin, MD;  Location: [facility];  Service: Endoscopy;;  esophageal mass  . Polypectomy  03/26/2015    Procedure: POLYPECTOMY;  Surgeon: Daneil Dolin, MD;  Location: [facility];  Service: Endoscopy;;  polypectomy at ileocecal valve    SOCIAL HISTORY: Social History   Social History  . Marital Status: Married    Spouse Name: N/A  . Number of Children: N/A  . Years of Education: N/A   Occupational History  . Not on file.   Social History Main Topics  . Smoking status: Former Smoker -- 1.50 packs/day for 47 years    Types: Cigarettes    Start date: 12/03/1966    Quit date: 12/08/2013  . Smokeless tobacco: Never Used  . Alcohol Use: No     Comment: Quit 2006: previously drank heavily. Had 1 drink 2-3 years ago (2013-2014)  . Drug Use: No     Comment: Previous IV drug use as teenager, none in 40 years. Last marijuanna use 02/22/15.  Marland Kitchen Sexual Activity: Yes   Other Topics Concern  . Not on file   Social History Narrative   Married 33 years, 2 children, 76 and 66. No grandchildren. He's quit smoking for about almost 16 months. History of IVDA Has had problems in the past with alcohol, but quit many years ago. 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 Pancreatic cancer Mother   . Lung cancer Father   . Colon cancer Neg Hx    indicated that his mother is deceased. He indicated that his father is deceased.    Mother and father deceased Mother was around 88, father around 75 Father died of lung cancer; his father was a smoker Mother died of pancreatic cancer 1 brother dead from being shot in the head Older brother hasn't been seen or heard from in 10-12 years  ALLERGIES:  is allergic to demerol.  MEDICATIONS:  Current Outpatient Prescriptions  Medication Sig Dispense Refill  . ACCU-CHEK AVIVA PLUS test strip 2 (two) times daily. use for testing  2  . acetaminophen (TYLENOL) 500 MG tablet Take 500 mg by mouth every 6 (six) hours as needed for fever.     Marland Kitchen albuterol (PROVENTIL HFA;VENTOLIN HFA) 108 (90 BASE) MCG/ACT inhaler Inhale 1 puff into the lungs every 6 (six) hours as needed for wheezing or shortness of breath.    . ALPRAZolam (XANAX) 1 MG tablet Take 1 tablet (1 mg total) by mouth every 8 (eight) hours as needed for anxiety. 90 tablet 1  . docusate sodium (COLACE) 100 MG capsule Take 100 mg by mouth 2 (two) times daily.  4  . FLUoxetine (PROZAC) 20 MG capsule Take 1 capsule (20 mg total) by mouth daily. 30 capsule 3  . HUMULIN R 100 UNIT/ML injection Inject 2-12 Units into the skin 2 (two) times daily as needed for high blood sugar. 151-200 = 2 units, 201-250 = 4 units, 251-300 = 6 units, 301-350 = 8 units, 351-400 = 10 units, 401-450 = 12 units.  2  . ipratropium-albuterol (DUONEB)  0.5-2.5 (3) MG/3ML SOLN INHALE CONTENTS OF 1 VIAL IN NEBULIZER EVERY 4 HOURS AS NEEDED FOR SHORTNESS OF BREATH.  3  . ketoconazole (NIZORAL) 2 % cream Apply 1 application topically 2 (two) times daily.  6  . Ledipasvir-Sofosbuvir (HARVONI) 90-400 MG TABS Take by mouth.    . lidocaine (XYLOCAINE) 2 % solution MIX 50/50 WITH WATER AND SWALLOW 10MLS 30 MINUTES BEFORE MEALS AND 30 MINUTES BEFORE BEDTIME  3  . lidocaine-prilocaine (EMLA) cream Apply a quarter size amount to port site 1 hour prior to chemo. Do not rub in. Cover with plastic wrap. 30 g 3  . meloxicam (MOBIC) 7.5 MG tablet Reported on 05/07/2015  6  . metFORMIN (GLUCOPHAGE) 500 MG tablet Take 500 mg by mouth 2 (two) times daily.  3  . mometasone (NASONEX) 50 MCG/ACT nasal spray Place 2 sprays into the nose daily as needed (congestion).     . nitroGLYCERIN (NITROSTAT) 0.4 MG SL tablet Place 1 tablet (0.4 mg total) under the tongue every 5 (five) minutes as needed for chest pain. 25 tablet 3  . ondansetron (ZOFRAN) 8 MG tablet TAKE 1 TABLET (8 MG TOTAL) BY MOUTH EVERY 8 (EIGHT) HOURS AS NEEDED FOR NAUSEA OR VOMITING. 30 tablet 2  . oxyCODONE (OXYCONTIN) 20 mg 12 hr tablet Take 1 tablet (20 mg total) by  mouth every 8 (eight) hours. 90 tablet 0  . oxyCODONE-acetaminophen (ROXICET) 5-325 MG tablet Take 1-2 tablets by mouth every 6 (six) hours as needed for moderate pain or severe pain. 60 tablet 0  . prochlorperazine (COMPAZINE) 10 MG tablet TAKE 1 TABLET (10 MG TOTAL) BY MOUTH EVERY 6 (SIX) HOURS AS NEEDED FOR NAUSEA OR VOMITING. 30 tablet 2  . SPIRIVA HANDIHALER 18 MCG inhalation capsule Place 1 puff into inhaler and inhale daily.  5  . sucralfate (CARAFATE) 1 g tablet Take 1 tablet by mouth 2 (two) times daily.  5  . levothyroxine (SYNTHROID, LEVOTHROID) 25 MCG tablet Take 25 mcg by mouth daily.  5   No current facility-administered medications for this visit.    Review of Systems  Constitutional: Positive for malaise/fatigue. Negative for fever, chills and weight loss.  HENT: Negative for congestion, hearing loss, nosebleeds and tinnitus.   Eyes: Negative.  Negative for blurred vision, double vision, pain and discharge.  Respiratory: Negative.  Negative for cough, hemoptysis, sputum production, shortness of breath and wheezing.   Cardiovascular: Negative for palpitations, claudication, leg swelling and PND.  Gastrointestinal: Negative for heartburn, nausea, vomiting, diarrhea, constipation, blood in stool and melena.  Genitourinary: Negative.  Negative for dysuria, urgency, frequency and hematuria.  Musculoskeletal: Positive for joint pain. Negative for myalgias and falls.  Skin: Negative.  Negative for itching and rash.  Neurological: Negative for dizziness, tingling, tremors, sensory change, speech change, focal weakness, seizures, loss of consciousness and headaches.  Endo/Heme/Allergies: Negative.  Does not bruise/bleed easily.  Psychiatric/Behavioral: Positive for depression. Negative for suicidal ideas, memory loss and substance abuse. The patient is nervous/anxious.   All other systems reviewed and are negative.  14 point ROS was done and is otherwise as detailed above or in  HPI   PHYSICAL EXAMINATION: ECOG PERFORMANCE STATUS: 1 - Symptomatic but completely ambulatory  Vitals with BMI 07/04/2015  Height   Weight 169 lbs 2 oz  BMI   Systolic 353  Diastolic 75  Pulse 614  Respirations 16   Physical Exam  Constitutional: He is oriented to person, place, and time and well-developed, well-nourished, and in no distress.  Thin. Wears glasses.   HENT:  Head: Normocephalic and atraumatic.  Nose: Nose normal.  Mouth/Throat: Oropharynx is clear and moist. He has dentures. No oropharyngeal exudate.  Dentures on top and bottom.  Eyes: Conjunctivae and EOM are normal. Pupils are equal, round, and reactive to light. Right eye exhibits no discharge. Left eye exhibits no discharge. No scleral icterus.  Neck: Normal range of motion. Neck supple. No tracheal deviation present. No thyromegaly present.  Cardiovascular: Normal rate, regular rhythm and normal heart sounds.  Exam reveals no gallop and no friction rub.   No murmur heard. Pulmonary/Chest: Effort normal and breath sounds normal. He has no wheezes. He has no rales.  Abdominal: Soft. Bowel sounds are normal. He exhibits no distension and no mass. There is no tenderness. There is no rebound and no guarding.  Musculoskeletal: Normal range of motion. He exhibits no edema.  Lymphadenopathy:    He has no cervical adenopathy.  Neurological: He is alert and oriented to person, place, and time. He has normal reflexes. No cranial nerve deficit. Gait normal. Coordination normal.  Skin: Skin is warm and dry. No rash noted.  Psychiatric: Mood, memory, affect and judgment normal.  Nursing note and vitals reviewed.   LABORATORY DATA:  I have reviewed the data as listed Results for RAYNARD, MAPPS (MRN [id number]) as of 07/04/2015 09:14  Ref. Range 06/11/2015 07:55  Sodium Latest Ref Range: 135-145 mmol/L 136  Potassium Latest Ref Range: 3.5-5.1 mmol/L 4.1  Chloride Latest Ref Range: 101-111 mmol/L 103  CO2 Latest Ref Range:  22-32 mmol/L 26  BUN Latest Ref Range: 6-20 mg/dL 9  Creatinine Latest Ref Range: 0.61-1.24 mg/dL 0.66  Calcium Latest Ref Range: 8.9-10.3 mg/dL 8.3 (L)  EGFR (Non-African Amer.) Latest Ref Range: >60 mL/min >60  EGFR (African American) Latest Ref Range: >60 mL/min >60  Glucose Latest Ref Range: 65-99 mg/dL 202 (H)  Anion gap Latest Ref Range: 5-15  7  Alkaline Phosphatase Latest Ref Range: 38-126 U/L 88  Albumin Latest Ref Range: 3.5-5.0 g/dL 3.3 (L)  AST Latest Ref Range: 15-41 U/L 116 (H)  ALT Latest Ref Range: 17-63 U/L 139 (H)  Total Protein Latest Ref Range: 6.5-8.1 g/dL 6.7  Total Bilirubin Latest Ref Range: 0.3-1.2 mg/dL 0.5  WBC Latest Ref Range: 4.0-10.5 K/uL 3.7 (L)  RBC Latest Ref Range: 4.22-5.81 MIL/uL 4.00 (L)  Hemoglobin Latest Ref Range: 13.0-17.0 g/dL 11.9 (L)  HCT Latest Ref Range: 39.0-52.0 % 35.8 (L)  MCV Latest Ref Range: 78.0-100.0 fL 89.5  MCH Latest Ref Range: 26.0-34.0 pg 29.8  MCHC Latest Ref Range: 30.0-36.0 g/dL 33.2  RDW Latest Ref Range: 11.5-15.5 % 17.2 (H)  Platelets Latest Ref Range: 150-400 K/uL 169  Neutrophils Latest Units: % 42  Lymphocytes Latest Units: % 20  Monocytes Relative Latest Units: % 32  Eosinophil Latest Units: % 3  Basophil Latest Units: % 2  NEUT# Latest Ref Range: 1.7-7.7 K/uL 1.6 (L)  Lymphocyte # Latest Ref Range: 0.7-4.0 K/uL 0.8  Monocyte # Latest Ref Range: 0.1-1.0 K/uL 1.2 (H)  Eosinophils Absolute Latest Ref Range: 0.0-0.7 K/uL 0.1  Basophils Absolute Latest Ref Range: 0.0-0.1 K/uL 0.1    PATHOLOGY:       RADIOGRAPHIC STUDIES: I have personally reviewed the radiological images as listed and agreed with the findings in the report. Study Result     CLINICAL DATA: Esophageal cancer. Possible liver lesion on PET.  EXAM: MRI ABDOMEN WITHOUT AND WITH CONTRAST  TECHNIQUE: Multiplanar multisequence MR imaging of  the abdomen was performed both before and after the administration of intravenous  contrast.  CONTRAST: 36m MULTIHANCE GADOBENATE DIMEGLUMINE 529 MG/ML IV SOLN  COMPARISON: PET-CT dated 04/05/2015.  FINDINGS: Lower chest: 2.0 cm patchy/nodular opacity in the posterior right lower lobe (series 6/image 2), new, likely reflecting atelectasis or possibly infection.  Lower esophageal wall thickening, likely corresponding to known primary esophageal neoplasm.  Hepatobiliary: 1.5 cm T2 hyperintense lesion in segment 7 (series 27/image 13), with vague enhancement, and corresponding to the hypermetabolic lesion on PET, suspicious for metastasis.  Two additional T2 hyperintense lesions measuring up to 5 mm may reflect cysts (series 6/ images 9 and 20).  Layering gallstones (series 6/ image 22), without associated inflammatory changes. No intrahepatic or extrahepatic ductal dilatation.  Pancreas: Within normal limits.  Spleen: Within normal limits.  Adrenals/Urinary Tract: Adrenal glands are within normal limits.  Kidneys are within normal limits. No hydronephrosis.  Stomach/Bowel: Stomach is within normal limits.  Visualized bowel is unremarkable.  Vascular/Lymphatic: No evidence of abdominal aortic aneurysm.  Small upper abdominal lymph nodes, including:  --13 mm short axis gastrohepatic node (series 6/image 17), suspicious  --11 mm short axis node in the porta hepatis (series 6/ image 21), indeterminate  Other: No abdominal ascites.  Musculoskeletal: No focal osseous lesions.  IMPRESSION: Lower esophageal wall thickening, likely corresponding to known primary esophageal neoplasm.  1.5 cm hepatic lesion in segment 7, corresponding to the hypermetabolic lesion on CT, suspicious for metastasis.  Two upper abdominal lymph nodes measuring up to 1.3 cm short axis, suspicious for nodal metastases.  Cholelithiasis.   Electronically Signed  By: SJulian HyM.D.  On: 04/30/2015 17:58   ASSESSMENT & PLAN:   Poorly differentiated esophageal carcinoma, distal esophagus HX IVDA, Alcohol use Hepatitis C Dysphagia COPD Anemia  Liver lesion, questionable metastases  He has completed therapy. He is realistically doing quite well. Weight is fairly stable. He is eating a fairly well rounded diet. He is somewhat more active, has been out doing some yard work.  Spoke with Dr. SIsidore Moosyesterday and discussed repeating imaging in June. Ongoing observation for now. May need to repeat liver imaging in near future after PET.   The patient has begun a Harvoni regimen for Hepatitis C.  He will return for follow up in 4 weeks with labs, CBC, and C-met. At this next visit, we will make sure the PET scans are ordered; if not, we will put in the order.   All questions were answered. The patient knows to call the clinic with any problems, questions or concerns.  This document serves as a record of services personally performed by SAncil Linsey MD. It was created on her behalf by EArlyce Harman a trained medical scribe. The creation of this record is based on the scribe's personal observations and the provider's statements to them. This document has been checked and approved by the attending provider.  I have reviewed the above documentation for accuracy and completeness, and I agree with the above.  This note was electronically signed.  PMolli Hazard MD  07/04/2015 10:59 AM

## 2015-07-05 ENCOUNTER — Other Ambulatory Visit: Payer: Self-pay | Admitting: Radiation Oncology

## 2015-07-05 DIAGNOSIS — C159 Malignant neoplasm of esophagus, unspecified: Secondary | ICD-10-CM

## 2015-07-07 ENCOUNTER — Encounter (HOSPITAL_COMMUNITY): Payer: Self-pay | Admitting: *Deleted

## 2015-07-07 ENCOUNTER — Inpatient Hospital Stay (HOSPITAL_COMMUNITY)
Admission: EM | Admit: 2015-07-07 | Discharge: 2015-07-09 | DRG: 194 | Disposition: A | Discharge status: Home / Self Care | Payer: Medicaid Other | Attending: Internal Medicine | Admitting: Internal Medicine

## 2015-07-07 ENCOUNTER — Emergency Department (HOSPITAL_COMMUNITY): Payer: Medicaid Other

## 2015-07-07 ENCOUNTER — Other Ambulatory Visit: Payer: Self-pay

## 2015-07-07 DIAGNOSIS — C155 Malignant neoplasm of lower third of esophagus: Secondary | ICD-10-CM | POA: Diagnosis present

## 2015-07-07 DIAGNOSIS — R071 Chest pain on breathing: Secondary | ICD-10-CM | POA: Diagnosis not present

## 2015-07-07 DIAGNOSIS — R740 Nonspecific elevation of levels of transaminase and lactic acid dehydrogenase [LDH]: Secondary | ICD-10-CM

## 2015-07-07 DIAGNOSIS — Z79899 Other long term (current) drug therapy: Secondary | ICD-10-CM

## 2015-07-07 DIAGNOSIS — F329 Major depressive disorder, single episode, unspecified: Secondary | ICD-10-CM | POA: Diagnosis present

## 2015-07-07 DIAGNOSIS — Z800 Family history of malignant neoplasm of digestive organs: Secondary | ICD-10-CM

## 2015-07-07 DIAGNOSIS — K2289 Other specified disease of esophagus: Secondary | ICD-10-CM

## 2015-07-07 DIAGNOSIS — Z9221 Personal history of antineoplastic chemotherapy: Secondary | ICD-10-CM

## 2015-07-07 DIAGNOSIS — R079 Chest pain, unspecified: Secondary | ICD-10-CM | POA: Diagnosis present

## 2015-07-07 DIAGNOSIS — Z923 Personal history of irradiation: Secondary | ICD-10-CM

## 2015-07-07 DIAGNOSIS — A419 Sepsis, unspecified organism: Secondary | ICD-10-CM | POA: Diagnosis present

## 2015-07-07 DIAGNOSIS — E119 Type 2 diabetes mellitus without complications: Secondary | ICD-10-CM

## 2015-07-07 DIAGNOSIS — J189 Pneumonia, unspecified organism: Secondary | ICD-10-CM | POA: Diagnosis present

## 2015-07-07 DIAGNOSIS — R6510 Systemic inflammatory response syndrome (SIRS) of non-infectious origin without acute organ dysfunction: Secondary | ICD-10-CM

## 2015-07-07 DIAGNOSIS — R7401 Elevation of levels of liver transaminase levels: Secondary | ICD-10-CM

## 2015-07-07 DIAGNOSIS — Y95 Nosocomial condition: Secondary | ICD-10-CM | POA: Diagnosis present

## 2015-07-07 DIAGNOSIS — J440 Chronic obstructive pulmonary disease with acute lower respiratory infection: Secondary | ICD-10-CM | POA: Diagnosis present

## 2015-07-07 DIAGNOSIS — Z7951 Long term (current) use of inhaled steroids: Secondary | ICD-10-CM

## 2015-07-07 DIAGNOSIS — R1310 Dysphagia, unspecified: Secondary | ICD-10-CM

## 2015-07-07 DIAGNOSIS — K769 Liver disease, unspecified: Secondary | ICD-10-CM

## 2015-07-07 DIAGNOSIS — C159 Malignant neoplasm of esophagus, unspecified: Secondary | ICD-10-CM | POA: Diagnosis not present

## 2015-07-07 DIAGNOSIS — B1920 Unspecified viral hepatitis C without hepatic coma: Secondary | ICD-10-CM | POA: Diagnosis present

## 2015-07-07 DIAGNOSIS — E039 Hypothyroidism, unspecified: Secondary | ICD-10-CM | POA: Diagnosis present

## 2015-07-07 DIAGNOSIS — Z87891 Personal history of nicotine dependence: Secondary | ICD-10-CM

## 2015-07-07 DIAGNOSIS — C787 Secondary malignant neoplasm of liver and intrahepatic bile duct: Secondary | ICD-10-CM | POA: Diagnosis present

## 2015-07-07 DIAGNOSIS — Z86010 Personal history of colonic polyps: Secondary | ICD-10-CM

## 2015-07-07 DIAGNOSIS — Z1211 Encounter for screening for malignant neoplasm of colon: Secondary | ICD-10-CM

## 2015-07-07 DIAGNOSIS — Z801 Family history of malignant neoplasm of trachea, bronchus and lung: Secondary | ICD-10-CM

## 2015-07-07 DIAGNOSIS — R918 Other nonspecific abnormal finding of lung field: Secondary | ICD-10-CM | POA: Diagnosis present

## 2015-07-07 DIAGNOSIS — Z79891 Long term (current) use of opiate analgesic: Secondary | ICD-10-CM

## 2015-07-07 DIAGNOSIS — K229 Disease of esophagus, unspecified: Secondary | ICD-10-CM

## 2015-07-07 DIAGNOSIS — Z915 Personal history of self-harm: Secondary | ICD-10-CM

## 2015-07-07 DIAGNOSIS — F32A Depression, unspecified: Secondary | ICD-10-CM

## 2015-07-07 DIAGNOSIS — K5730 Diverticulosis of large intestine without perforation or abscess without bleeding: Secondary | ICD-10-CM

## 2015-07-07 DIAGNOSIS — R0602 Shortness of breath: Secondary | ICD-10-CM

## 2015-07-07 DIAGNOSIS — J45909 Unspecified asthma, uncomplicated: Secondary | ICD-10-CM | POA: Diagnosis present

## 2015-07-07 DIAGNOSIS — Z8619 Personal history of other infectious and parasitic diseases: Secondary | ICD-10-CM

## 2015-07-07 DIAGNOSIS — Z794 Long term (current) use of insulin: Secondary | ICD-10-CM

## 2015-07-07 HISTORY — DX: Malignant neoplasm of esophagus, unspecified: C15.9

## 2015-07-07 HISTORY — DX: Other nonspecific abnormal finding of lung field: R91.8

## 2015-07-07 LAB — I-STAT CG4 LACTIC ACID, ED: Lactic Acid, Venous: 2.25 mmol/L (ref 0.5–2.0)

## 2015-07-07 LAB — CBC WITH DIFFERENTIAL/PLATELET
Basophils Absolute: 0 10*3/uL (ref 0.0–0.1)
Basophils Relative: 0 %
Eosinophils Absolute: 0.3 10*3/uL (ref 0.0–0.7)
Eosinophils Relative: 3 %
HCT: 34.8 % — ABNORMAL LOW (ref 39.0–52.0)
Hemoglobin: 11.8 g/dL — ABNORMAL LOW (ref 13.0–17.0)
Lymphocytes Relative: 13 %
Lymphs Abs: 1.1 10*3/uL (ref 0.7–4.0)
MCH: 30.7 pg (ref 26.0–34.0)
MCHC: 33.9 g/dL (ref 30.0–36.0)
MCV: 90.6 fL (ref 78.0–100.0)
Monocytes Absolute: 1.3 10*3/uL — ABNORMAL HIGH (ref 0.1–1.0)
Monocytes Relative: 14 %
Neutro Abs: 6.3 10*3/uL (ref 1.7–7.7)
Neutrophils Relative %: 70 %
Platelets: 196 10*3/uL (ref 150–400)
RBC: 3.84 MIL/uL — ABNORMAL LOW (ref 4.22–5.81)
RDW: 15.7 % — ABNORMAL HIGH (ref 11.5–15.5)
WBC: 9 10*3/uL (ref 4.0–10.5)

## 2015-07-07 LAB — COMPREHENSIVE METABOLIC PANEL
ALT: 20 U/L (ref 17–63)
AST: 27 U/L (ref 15–41)
Albumin: 3.2 g/dL — ABNORMAL LOW (ref 3.5–5.0)
Alkaline Phosphatase: 85 U/L (ref 38–126)
Anion gap: 9 (ref 5–15)
BUN: 10 mg/dL (ref 6–20)
CO2: 24 mmol/L (ref 22–32)
Calcium: 8.7 mg/dL — ABNORMAL LOW (ref 8.9–10.3)
Chloride: 103 mmol/L (ref 101–111)
Creatinine, Ser: 0.68 mg/dL (ref 0.61–1.24)
GFR calc Af Amer: >60 mL/min (ref >60)
GFR calc non Af Amer: >60 mL/min (ref >60)
Glucose, Bld: 135 mg/dL — ABNORMAL HIGH (ref 65–99)
Potassium: 4.1 mmol/L (ref 3.5–5.1)
Sodium: 136 mmol/L (ref 135–145)
Total Bilirubin: 0.4 mg/dL (ref 0.3–1.2)
Total Protein: 7.4 g/dL (ref 6.5–8.1)

## 2015-07-07 LAB — I-STAT TROPONIN, ED: Troponin i, poc: 0 ng/mL (ref 0.00–0.08)

## 2015-07-07 LAB — URINALYSIS, ROUTINE W REFLEX MICROSCOPIC
Bilirubin Urine: NEGATIVE
Glucose, UA: NEGATIVE mg/dL
Hgb urine dipstick: NEGATIVE
Ketones, ur: NEGATIVE mg/dL
Leukocytes, UA: NEGATIVE
Nitrite: NEGATIVE
Protein, ur: NEGATIVE mg/dL
Specific Gravity, Urine: 1.01 (ref 1.005–1.030)
pH: 6.5 (ref 5.0–8.0)

## 2015-07-07 LAB — CBG MONITORING, ED: Glucose-Capillary: 134 mg/dL — ABNORMAL HIGH (ref 65–99)

## 2015-07-07 LAB — LACTIC ACID, PLASMA: Lactic Acid, Venous: 2.1 mmol/L (ref 0.5–2.0)

## 2015-07-07 MED ORDER — SODIUM CHLORIDE 0.9 % IV BOLUS (SEPSIS)
1000.0000 mL | Freq: Once | INTRAVENOUS | Status: AC
  Administered 2015-07-07: 1000 mL via INTRAVENOUS

## 2015-07-07 MED ORDER — SODIUM BICARBONATE 8.4 % IV SOLN: INTRAVENOUS | Status: DC

## 2015-07-07 MED ORDER — ACETAMINOPHEN 325 MG PO TABS
650.0000 mg | ORAL_TABLET | Freq: Once | ORAL | Status: AC
  Administered 2015-07-07: 650 mg via ORAL
  Filled 2015-07-07: qty 2

## 2015-07-07 MED ORDER — SODIUM CHLORIDE 0.9 % IV BOLUS (SEPSIS): 1000.0000 mL | Freq: Once | INTRAVENOUS | Status: DC

## 2015-07-07 MED ORDER — PIPERACILLIN-TAZOBACTAM 3.375 G IVPB 30 MIN
3.3750 g | Freq: Once | INTRAVENOUS | Status: AC
  Administered 2015-07-07: 3.375 g via INTRAVENOUS
  Filled 2015-07-07: qty 50

## 2015-07-07 MED ORDER — VANCOMYCIN HCL 10 G IV SOLR
1500.0000 mg | Freq: Once | INTRAVENOUS | Status: AC
  Administered 2015-07-07: 1500 mg via INTRAVENOUS
  Filled 2015-07-07: qty 1500

## 2015-07-07 MED ORDER — SODIUM CHLORIDE 0.9 % IV SOLN
1000.0000 mL | Freq: Once | INTRAVENOUS | Status: AC
  Administered 2015-07-07: 1000 mL via INTRAVENOUS

## 2015-07-07 MED ORDER — SODIUM BICARBONATE 8.4 % IV SOLN: 100.0000 meq | Freq: Once | INTRAVENOUS | Status: DC

## 2015-07-07 NOTE — H&P (Signed)
History and Physical    Brent Franco [id number] DOB: [birth year] DOA: 07/07/2015  Referring MD/NP/PA: Virgel Manifold, MD PCP: [name], NP  Outpatient Specialists:   Gastroenterology; Daneil Dolin, MD Patient coming from: home  Chief Complaint: Chest pain.  HPI: Brent Franco is a 65 y.o. male with medical history significant of COPD, Hep C on Harvoni, depression, esophageal CA on chemotherapy, Portacath in situ,  and DM type 2 who presented with complaints of sudden onset, intermittent, stabbing, left-sided chest pain onset at Brent Franco on 4/29. He also endorses a fever, chills, and cough onset 4/29 as well. He reports that he has had PNA in the past, and that his pain feels similar to those episodes. One hour PTA, he took an oxycontin with relief of his CP. He is unsure as to whether pain is aggravated with deep breath.  He has a hx of smoking but quit a year and half ago. He received his flu shot as well. He does not normally have high BP and does not take any meds as an outpatient, though it is now soft. He denies any other problems.  He lives with his wife of 25 years and his daughter, none of whom was ill.  He had a prior "cardiac work up" and it was negative.   ED Course: While in the ED, workup showed a lactic acid of 2.25. His WBC was wnl. Troponin was negative. UA was negative as well. CXR showed no acute disease. His BP was found to be soft and he was febrile with T of 102. Hospitalist was asked to refer for admission for tx of sepsis.   Review of Systems: As per HPI otherwise 10 point review of systems negative.    Past Medical History  Diagnosis Date  . COPD (chronic obstructive pulmonary disease) (Brent Franco)   . ETOH abuse     stopped 10 years ago  . Depression     suicide attempt 20 years ago  . Arthritis   . Asthma   . Viral hepatitis C   . Diabetes mellitus without complication (Brent Franco)   . Hypothyroidism   . Cancer (Brent Franco)     esophageal  . Pneumonia 02/2015  . Itching     both feet, known has athlet's feet now    Past Surgical History  Procedure Laterality Date  . Other surgical history Left 1995    fatty tissue tumor right upper thigh  . Dental surgery N/A January 2016    "dentures and spurs"   . Tumor excision Left 1990    inner thigh  . Colonoscopy with propofol N/A 03/26/2015    Procedure: COLONOSCOPY WITH PROPOFOL;  Surgeon: Daneil Dolin, MD;  [facility];  Service: Endoscopy;  Laterality: N/A;  1100  . Esophagogastroduodenoscopy (egd) with propofol N/A 03/26/2015    Procedure: ESOPHAGOGASTRODUODENOSCOPY (EGD) WITH PROPOFOL;  Surgeon: Daneil Dolin, MD;  [facility];  Service: Endoscopy;  Laterality: N/A;  . Biopsy  03/26/2015    Procedure: BIOPSY;  Surgeon: Daneil Dolin, MD;  [facility];  Service: Endoscopy;;  esophageal mass  . Polypectomy  03/26/2015    Procedure: POLYPECTOMY;  Surgeon: Daneil Dolin, MD;  [facility];  Service: Endoscopy;;  polypectomy at ileocecal valve     reports that he quit smoking about 18 months ago. His smoking use included Cigarettes. He started smoking about 48 years ago. He has a 70.5 pack-year smoking history. He has never  used smokeless tobacco. He reports that he does not drink alcohol or use illicit drugs.  Allergies  Allergen Reactions  . Demerol [Meperidine] Rash    Family History  Problem Relation Age of Onset  . Pancreatic cancer Mother   . Lung cancer Father   . Colon cancer Neg Hx     Prior to Admission medications   Medication Sig Start Date End Date Taking? Authorizing Provider  acetaminophen (TYLENOL) 500 MG tablet Take 500 mg by mouth every 6 (six) hours as needed for fever.   Yes Historical Provider, MD  albuterol (PROVENTIL HFA;VENTOLIN HFA) 108 (90 BASE) MCG/ACT inhaler Inhale 1 puff into the lungs every 6 (six) hours as needed for wheezing or shortness of breath.   Yes Historical Provider, MD  ALPRAZolam Duanne Moron) 1 MG tablet Take 1 tablet (1  mg total) by mouth every 8 (eight) hours as needed for anxiety. 05/21/15  Yes Patrici Ranks, MD  docusate sodium (COLACE) 100 MG capsule Take 100 mg by mouth 2 (two) times daily. 03/10/15  Yes Historical Provider, MD  FLUoxetine (PROZAC) 20 MG capsule Take 1 capsule (20 mg total) by mouth daily. 06/11/15  Yes Thomas S Kefalas, PA-C  HUMULIN R 100 UNIT/ML injection Inject 2-12 Units into the skin 2 (two) times daily as needed for high blood sugar. 151-200 = 2 units, 201-250 = 4 units, 251-300 = 6 units, 301-350 = 8 units, 351-400 = 10 units, 401-450 = 12 units. 03/13/15  Yes Historical Provider, MD  ipratropium-albuterol (DUONEB) 0.5-2.5 (3) MG/3ML SOLN INHALE CONTENTS OF 1 VIAL IN NEBULIZER EVERY 4 HOURS AS NEEDED FOR SHORTNESS OF BREATH. 03/10/15  Yes Historical Provider, MD  ketoconazole (NIZORAL) 2 % cream Apply 1 application topically daily as needed for irritation.  10/24/14  Yes Historical Provider, MD  Ledipasvir-Sofosbuvir (HARVONI) 90-400 MG TABS Take 1 tablet by mouth daily.    Yes Historical Provider, MD  levothyroxine (SYNTHROID, LEVOTHROID) 25 MCG tablet Take 25 mcg by mouth daily. 06/27/15  Yes Historical Provider, MD  lidocaine (XYLOCAINE) 2 % solution MIX 50/50 WITH WATER AND SWALLOW 10MLS 30 MINUTES BEFORE MEALS AND 30 MINUTES BEFORE BEDTIME 05/22/15  Yes Historical Provider, MD  lidocaine-prilocaine (EMLA) cream Apply a quarter size amount to port site 1 hour prior to chemo. Do not rub in. Cover with plastic wrap. 03/30/15  Yes Patrici Ranks, MD  meloxicam (MOBIC) 7.5 MG tablet TAKE ONE TABLET BY MOUTH ONCE DAILY 05/01/15  Yes Historical Provider, MD  metFORMIN (GLUCOPHAGE) 500 MG tablet Take 500 mg by mouth 2 (two) times daily. 03/10/15  Yes Historical Provider, MD  mometasone (NASONEX) 50 MCG/ACT nasal spray Place 2 sprays into the nose daily as needed (congestion).    Yes Historical Provider, MD  nitroGLYCERIN (NITROSTAT) 0.4 MG SL tablet Place 1 tablet (0.4 mg total) under the tongue  every 5 (five) minutes as needed for chest pain. 09/21/14  Yes Brent Commons, MD  ondansetron (ZOFRAN) 8 MG tablet TAKE 1 TABLET (8 MG TOTAL) BY MOUTH EVERY 8 (EIGHT) HOURS AS NEEDED FOR NAUSEA OR VOMITING. 07/02/15  Yes Patrici Ranks, MD  oxyCODONE (OXYCONTIN) 20 mg 12 hr tablet Take 1 tablet (20 mg total) by mouth every 8 (eight) hours. 06/11/15  Yes Baird Cancer, PA-C  oxyCODONE-acetaminophen (ROXICET) 5-325 MG tablet Take 1-2 tablets by mouth every 6 (six) hours as needed for moderate pain or severe pain. 06/11/15  Yes Baird Cancer, PA-C  prochlorperazine (COMPAZINE) 10 MG tablet TAKE 1  TABLET (10 MG TOTAL) BY MOUTH EVERY 6 (SIX) HOURS AS NEEDED FOR NAUSEA OR VOMITING. 07/02/15  Yes Patrici Ranks, MD  SPIRIVA HANDIHALER 18 MCG inhalation capsule Place 1 puff into inhaler and inhale daily. 12/04/14  Yes Historical Provider, MD  sucralfate (CARAFATE) 1 g tablet Take 1 tablet by mouth 2 (two) times daily. 05/10/15  Yes Historical Provider, MD  ACCU-CHEK AVIVA PLUS test strip 2 (two) times daily. use for testing 05/02/15   Historical Provider, MD    Physical Exam: Filed Vitals:   07/07/15 2244 07/07/15 2300 07/07/15 2330 07/08/15 0000  BP: 92/66 95/69 92/60  88/60  Pulse: 101 97 97 95  Temp:      TempSrc:      Resp: 18 12 20 18   Height:      Weight:      SpO2: 92% 91% 94% 94%      Constitutional: NAD, calm, comfortable Filed Vitals:   07/07/15 2244 07/07/15 2300 07/07/15 2330 07/08/15 0000  BP: 92/66 95/69 92/60  88/60  Pulse: 101 97 97 95  Temp:      TempSrc:      Resp: 18 12 20 18   Height:      Weight:      SpO2: 92% 91% 94% 94%   Eyes: PERRL, lids and conjunctivae normal ENMT: Mucous membranes are moist. Posterior pharynx clear of any exudate or lesions.Normal dentition.  Neck: normal, supple, no masses, no thyromegaly Respiratory: clear to auscultation bilaterally, no wheezing, no crackles. Normal respiratory effort. No accessory muscle use.  Cardiovascular:  Regular rate and rhythm, no murmurs / rubs / gallops. No extremity edema. 2+ pedal pulses. No carotid bruits.  Abdomen: no tenderness, no masses palpated. No hepatosplenomegaly. Bowel sounds positive.  Musculoskeletal: no clubbing / cyanosis. No joint deformity upper and lower extremities. Good ROM, no contractures. Normal muscle tone.  Skin: no rashes, lesions, ulcers. No induration Neurologic: CN 2-12 grossly intact. Sensation intact, DTR normal. Strength 5/5 in all 4.  Psychiatric: Normal judgment and insight. Alert and oriented x 3. Normal mood.   Labs on Admission: I have personally reviewed following labs and imaging studies  CBC:  Recent Labs Lab 07/07/15 2021  WBC 9.0  NEUTROABS 6.3  HGB 11.8*  HCT 34.8*  MCV 90.6  PLT 123456   Basic Metabolic Panel:  Recent Labs Lab 07/07/15 2021  NA 136  K 4.1  CL 103  CO2 24  GLUCOSE 135*  BUN 10  CREATININE 0.68  CALCIUM 8.7*   GFR: Estimated Creatinine Clearance: 101.9 mL/min (by C-G formula based on Cr of 0.68). Liver Function Tests:  Recent Labs Lab 07/07/15 2021  AST 27  ALT 20  ALKPHOS 85  BILITOT 0.4  PROT 7.4  ALBUMIN 3.2*   CBG:  Recent Labs Lab 07/07/15 2003  GLUCAP 134*    Urine analysis:    Component Value Date/Time   COLORURINE YELLOW 07/07/2015 2240   APPEARANCEUR CLEAR 07/07/2015 2240   LABSPEC 1.010 07/07/2015 2240   PHURINE 6.5 07/07/2015 2240   GLUCOSEU NEGATIVE 07/07/2015 2240   HGBUR NEGATIVE 07/07/2015 2240   BILIRUBINUR NEGATIVE 07/07/2015 2240   KETONESUR NEGATIVE 07/07/2015 2240   PROTEINUR NEGATIVE 07/07/2015 2240   UROBILINOGEN 0.2 08/23/2014 1321   NITRITE NEGATIVE 07/07/2015 2240   LEUKOCYTESUR NEGATIVE 07/07/2015 2240    Radiological Exams on Admission: Dg Chest 2 View  07/07/2015  CLINICAL DATA:  Chest pain. EXAM: CHEST  2 VIEW COMPARISON:  04/17/2015 FINDINGS: Right-sided porta catheter with tip at  the upper cavoatrial junction. Airspace opacity seen previously is  resolved. There is no edema, consolidation, effusion, or pneumothorax. Chronic hyperinflation. Normal heart size and mediastinal contours. IMPRESSION: No evidence of active disease. Electronically Signed   By: Monte Fantasia M.D.   On: 07/07/2015 21:01    EKG: Independently reviewed.   Assessment/Plan Principal Problem:   Sepsis ([place]) Active Problems:   Chest pain   Esophageal cancer (HCC)   Hepatitis C    1. Sepsis of unclear etiology. Patient has fever and chills, WBC wnl and he is receiving treatment for esophageal CA.   Lactic acid 2.25. CXR showed no acute disease. His BP is soft, will give fluids. Will obtain pan cultures and start on broad spectrum antibiotics given he is compromised.   Must r/out PE so will obtain CTPA as he has a hx of cancer, tachycardia and pleuritic chest pain.  2. Atypical CP:  Will cycle troponins, obtain CTPA.  3. Hypothyroidism. Check TSH. Continue synthroid.  4. Hep C. Will continue with Harvoni. 5. Esophageal Cancer:  Stable.    DVT prophylaxis: Lovenox Code Status: Full Family Communication: wife present at bedside Disposition Plan: anticipate discharge home in 2-3 days  Consults called: none Admission status: admit as inpatient   Orvan Falconer, MD [facility]  If 7PM-7AM, please contact night-coverage [email or web address] Password [email or web address] 07/08/2015, 12:41 AM   By signing my name below, I, Delene Ruffini, attest that this documentation has been prepared under the direction and in the presence of Orvan Falconer, MD. Electronically Signed: Delene Ruffini, Scribe 07/08/2015 11:45pm

## 2015-07-07 NOTE — ED Notes (Addendum)
CRITICAL VALUE ALERT  Critical value received:  Lactic Acid  -   2.1  Date of notification:  07/07/2015  Time of notification:  2121  Critical value read back: yes  Nurse who received alert:  Lavenia Atlas, RN- made Ron, RN aware.  MD notified (1st page):  Wilson Singer  Time of first page:  2122

## 2015-07-07 NOTE — ED Provider Notes (Signed)
CSN: [id number]     Arrival date & time 07/07/15  1956 History  By signing my name below, I, Nicole Kindred, attest that this documentation has been prepared under the direction and in the presence of Virgel Manifold, MD.   Electronically Signed: Nicole Kindred, ED Scribe. 07/07/2015. 10:33 PM   Chief Complaint  Patient presents with  . Chest Pain    The history is provided by the patient. No language interpreter was used.   HPI Comments: Brent Franco is a 65 y.o. male with PMHx of COPD DM, esophageal cancer, and pneumonia who presents to the Emergency Department complaining of sudden onset, intermittent, stabbing, left sided chest pain, beginning at 6 PM tonight. Pt also reports fever and cough, onset today. Wife states loss of appetite. She is worried that the pt may have pneumonia because he has had it twice in the past. No other associated symptoms noted. Pt took an oxycontin 1 hr PTA with relief to his chest pain. No other worsening or alleviating factors noted. Pt denies shortness of breath, rash, or any other pertinent symptoms. Pt was diagnosed with esophageal cancer in January 2017 and just finished chemotherapy and radiation two weeks ago.   Past Medical History  Diagnosis Date  . COPD (chronic obstructive pulmonary disease) ([place])   . ETOH abuse     stopped 10 years ago  . Depression     suicide attempt 20 years ago  . Arthritis   . Asthma   . Viral hepatitis C   . Diabetes mellitus without complication ([place])   . Hypothyroidism   . Cancer ([place])     esophageal  . Pneumonia 02/2015  . Itching     both feet, known has athlet's feet now   Past Surgical History  Procedure Laterality Date  . Other surgical history Left 1995    fatty tissue tumor right upper thigh  . Dental surgery N/A January 2016    "dentures and spurs"   . Tumor excision Left 1990    inner thigh  . Colonoscopy with propofol N/A 03/26/2015    Procedure: COLONOSCOPY WITH PROPOFOL;  Surgeon: Daneil Dolin, MD;  [facility];  Service: Endoscopy;  Laterality: N/A;  1100  . Esophagogastroduodenoscopy (egd) with propofol N/A 03/26/2015    Procedure: ESOPHAGOGASTRODUODENOSCOPY (EGD) WITH PROPOFOL;  Surgeon: Daneil Dolin, MD;  [facility];  Service: Endoscopy;  Laterality: N/A;  . Biopsy  03/26/2015    Procedure: BIOPSY;  Surgeon: Daneil Dolin, MD;  [facility];  Service: Endoscopy;;  esophageal mass  . Polypectomy  03/26/2015    Procedure: POLYPECTOMY;  Surgeon: Daneil Dolin, MD;  [facility];  Service: Endoscopy;;  polypectomy at ileocecal valve   Family History  Problem Relation Age of Onset  . Pancreatic cancer Mother   . Lung cancer Father   . Colon cancer Neg Hx    Social History  Substance Use Topics  . Smoking status: Former Smoker -- 1.50 packs/day for 47 years    Types: Cigarettes    Start date: 12/03/1966    Quit date: 12/08/2013  . Smokeless tobacco: Never Used  . Alcohol Use: No     Comment: Quit 2006: previously drank heavily. Had 1 drink 2-3 years ago (2013-2014)    Review of Systems  Constitutional: Positive for fever and appetite change.  Respiratory: Positive for cough. Negative for shortness of breath.   Cardiovascular: Positive for chest pain.  Skin:  Negative for rash.  All other systems reviewed and are negative.    Allergies  Demerol  Home Medications   Prior to Admission medications   Medication Sig Start Date End Date Taking? Authorizing Provider  acetaminophen (TYLENOL) 500 MG tablet Take 500 mg by mouth every 6 (six) hours as needed for fever.   Yes Historical Provider, MD  albuterol (PROVENTIL HFA;VENTOLIN HFA) 108 (90 BASE) MCG/ACT inhaler Inhale 1 puff into the lungs every 6 (six) hours as needed for wheezing or shortness of breath.   Yes Historical Provider, MD  ALPRAZolam Duanne Moron) 1 MG tablet Take 1 tablet (1 mg total) by mouth every 8 (eight) hours as needed for anxiety. 05/21/15  Yes Patrici Ranks, MD  docusate sodium (COLACE) 100 MG capsule Take 100 mg by mouth 2 (two) times daily. 03/10/15  Yes Historical Provider, MD  FLUoxetine (PROZAC) 20 MG capsule Take 1 capsule (20 mg total) by mouth daily. 06/11/15  Yes Thomas S Kefalas, PA-C  HUMULIN R 100 UNIT/ML injection Inject 2-12 Units into the skin 2 (two) times daily as needed for high blood sugar. 151-200 = 2 units, 201-250 = 4 units, 251-300 = 6 units, 301-350 = 8 units, 351-400 = 10 units, 401-450 = 12 units. 03/13/15  Yes Historical Provider, MD  ipratropium-albuterol (DUONEB) 0.5-2.5 (3) MG/3ML SOLN INHALE CONTENTS OF 1 VIAL IN NEBULIZER EVERY 4 HOURS AS NEEDED FOR SHORTNESS OF BREATH. 03/10/15  Yes Historical Provider, MD  ketoconazole (NIZORAL) 2 % cream Apply 1 application topically daily as needed for irritation.  10/24/14  Yes Historical Provider, MD  Ledipasvir-Sofosbuvir (HARVONI) 90-400 MG TABS Take 1 tablet by mouth daily.    Yes Historical Provider, MD  levothyroxine (SYNTHROID, LEVOTHROID) 25 MCG tablet Take 25 mcg by mouth daily. 06/27/15  Yes Historical Provider, MD  lidocaine (XYLOCAINE) 2 % solution MIX 50/50 WITH WATER AND SWALLOW 10MLS 30 MINUTES BEFORE MEALS AND 30 MINUTES BEFORE BEDTIME 05/22/15  Yes Historical Provider, MD  lidocaine-prilocaine (EMLA) cream Apply a quarter size amount to port site 1 hour prior to chemo. Do not rub in. Cover with plastic wrap. 03/30/15  Yes Patrici Ranks, MD  meloxicam (MOBIC) 7.5 MG tablet TAKE ONE TABLET BY MOUTH ONCE DAILY 05/01/15  Yes Historical Provider, MD  metFORMIN (GLUCOPHAGE) 500 MG tablet Take 500 mg by mouth 2 (two) times daily. 03/10/15  Yes Historical Provider, MD  mometasone (NASONEX) 50 MCG/ACT nasal spray Place 2 sprays into the nose daily as needed (congestion).    Yes Historical Provider, MD  nitroGLYCERIN (NITROSTAT) 0.4 MG SL tablet Place 1 tablet (0.4 mg total) under the tongue every 5 (five) minutes as needed for chest pain. 09/21/14  Yes Herminio Commons, MD   ondansetron (ZOFRAN) 8 MG tablet TAKE 1 TABLET (8 MG TOTAL) BY MOUTH EVERY 8 (EIGHT) HOURS AS NEEDED FOR NAUSEA OR VOMITING. 07/02/15  Yes Patrici Ranks, MD  oxyCODONE (OXYCONTIN) 20 mg 12 hr tablet Take 1 tablet (20 mg total) by mouth every 8 (eight) hours. 06/11/15  Yes Baird Cancer, PA-C  oxyCODONE-acetaminophen (ROXICET) 5-325 MG tablet Take 1-2 tablets by mouth every 6 (six) hours as needed for moderate pain or severe pain. 06/11/15  Yes Manon Hilding Kefalas, PA-C  prochlorperazine (COMPAZINE) 10 MG tablet TAKE 1 TABLET (10 MG TOTAL) BY MOUTH EVERY 6 (SIX) HOURS AS NEEDED FOR NAUSEA OR VOMITING. 07/02/15  Yes Patrici Ranks, MD  SPIRIVA HANDIHALER 18 MCG inhalation capsule Place 1 puff into inhaler and  inhale daily. 12/04/14  Yes Historical Provider, MD  sucralfate (CARAFATE) 1 g tablet Take 1 tablet by mouth 2 (two) times daily. 05/10/15  Yes Historical Provider, MD  ACCU-CHEK AVIVA PLUS test strip 2 (two) times daily. use for testing 05/02/15   Historical Provider, MD   BP 126/74 mmHg  Pulse 118  Temp(Src) 101.8 F (38.8 C) (Oral)  Resp 20  Ht 6\' 1"  (1.854 m)  Wt 168 lb (76.204 kg)  BMI 22.17 kg/m2  SpO2 95% Physical Exam  Constitutional: He is oriented to person, place, and time. He appears well-developed and well-nourished.  Tired appearing.   HENT:  Head: Normocephalic and atraumatic.  Eyes: EOM are normal.  Neck: Normal range of motion.  Cardiovascular: Normal rate, regular rhythm, normal heart sounds and intact distal pulses.   Pulmonary/Chest: Effort normal and breath sounds normal. No respiratory distress.  Abdominal: Soft. He exhibits no distension. There is no tenderness.  Musculoskeletal: Normal range of motion.  Neurological: He is alert and oriented to person, place, and time.  Skin: Skin is warm and dry.  Psychiatric: He has a normal mood and affect. Judgment normal.  Nursing note and vitals reviewed.   ED Course  Procedures (including critical care  time) DIAGNOSTIC STUDIES: Oxygen Saturation is 95% on RA, adequate by my interpretation.    COORDINATION OF CARE: 9:02 PM-Discussed treatment plan which includes CXRm urinalysis, CBC with differential, CMP, and I-stat troponin with pt at bedside and pt agreed to plan.   Labs Review Labs Reviewed  CBC WITH DIFFERENTIAL/PLATELET - Abnormal; Notable for the following:    RBC 3.84 (*)    Hemoglobin 11.8 (*)    HCT 34.8 (*)    RDW 15.7 (*)    Monocytes Absolute 1.3 (*)    All other components within normal limits  COMPREHENSIVE METABOLIC PANEL - Abnormal; Notable for the following:    Glucose, Bld 135 (*)    Calcium 8.7 (*)    Albumin 3.2 (*)    All other components within normal limits  LACTIC ACID, PLASMA - Abnormal; Notable for the following:    Lactic Acid, Venous 2.1 (*)    All other components within normal limits  CBG MONITORING, ED - Abnormal; Notable for the following:    Glucose-Capillary 134 (*)    All other components within normal limits  I-STAT CG4 LACTIC ACID, ED - Abnormal; Notable for the following:    Lactic Acid, Venous 2.25 (*)    All other components within normal limits  CULTURE, BLOOD (ROUTINE X 2)  CULTURE, BLOOD (ROUTINE X 2)  URINALYSIS, ROUTINE W REFLEX MICROSCOPIC (NOT AT [facility])  LACTIC ACID, PLASMA  I-STAT TROPOININ, ED  CBG MONITORING, ED    Imaging Review Dg Chest 2 View  07/07/2015  CLINICAL DATA:  Chest pain. EXAM: CHEST  2 VIEW COMPARISON:  04/17/2015 FINDINGS: Right-sided porta catheter with tip at the upper cavoatrial junction. Airspace opacity seen previously is resolved. There is no edema, consolidation, effusion, or pneumothorax. Chronic hyperinflation. Normal heart size and mediastinal contours. IMPRESSION: No evidence of active disease. Electronically Signed   By: Monte Fantasia M.D.   On: 07/07/2015 21:01   I have personally reviewed and evaluated these images and lab results as part of my medical decision-making.   EKG  Interpretation   Date/Time:  Saturday July 07 2015 20:17:12 EDT Ventricular Rate:  115 PR Interval:  124 QRS Duration: 82 QT Interval:  333 QTC Calculation: 461 R Axis:   88 Text Interpretation:  Sinus tachycardia Borderline right axis deviation  Confirmed by Wilson Singer  MD, Afra Tricarico ([id number]) on 07/07/2015 11:22:38 PM      MDM   Final diagnoses:  SIRS (systemic inflammatory response syndrome) (HCC)  Chest pain, unspecified chest pain type   64yM with fever/chills, generalized weakness and l lateral CP. Source unclear. Mild elevation in lactic acid and BP soft. Will admit. Empiric abx. IVF.   I personally preformed the services scribed in my presence. The recorded information has been reviewed is accurate. Virgel Manifold, MD.      Virgel Manifold, MD 07/11/15 [phone]

## 2015-07-07 NOTE — ED Notes (Addendum)
Pt reports left sided chest pain that is sharp and shooting that started about 6pm. Pt started with a cough today and pt has a fever. Pt reports pain level is a 1 due to him having an oxycontin? 1 hr PTA. Pt was diagnosed with esophageal cancer in January and just finished chemo and radiation x 2 weeks.

## 2015-07-07 NOTE — ED Notes (Signed)
Dr Wilson Singer notified of lactic acid of 2.25

## 2015-07-08 ENCOUNTER — Emergency Department (HOSPITAL_COMMUNITY): Payer: Medicaid Other

## 2015-07-08 ENCOUNTER — Encounter (HOSPITAL_COMMUNITY): Payer: Self-pay | Admitting: Internal Medicine

## 2015-07-08 DIAGNOSIS — Z915 Personal history of self-harm: Secondary | ICD-10-CM | POA: Diagnosis not present

## 2015-07-08 DIAGNOSIS — A419 Sepsis, unspecified organism: Secondary | ICD-10-CM | POA: Diagnosis not present

## 2015-07-08 DIAGNOSIS — C155 Malignant neoplasm of lower third of esophagus: Secondary | ICD-10-CM | POA: Diagnosis present

## 2015-07-08 DIAGNOSIS — Z87891 Personal history of nicotine dependence: Secondary | ICD-10-CM | POA: Diagnosis not present

## 2015-07-08 DIAGNOSIS — F329 Major depressive disorder, single episode, unspecified: Secondary | ICD-10-CM | POA: Diagnosis present

## 2015-07-08 DIAGNOSIS — E119 Type 2 diabetes mellitus without complications: Secondary | ICD-10-CM | POA: Diagnosis present

## 2015-07-08 DIAGNOSIS — Y95 Nosocomial condition: Secondary | ICD-10-CM | POA: Diagnosis present

## 2015-07-08 DIAGNOSIS — Z801 Family history of malignant neoplasm of trachea, bronchus and lung: Secondary | ICD-10-CM | POA: Diagnosis not present

## 2015-07-08 DIAGNOSIS — Z7951 Long term (current) use of inhaled steroids: Secondary | ICD-10-CM | POA: Diagnosis not present

## 2015-07-08 DIAGNOSIS — J440 Chronic obstructive pulmonary disease with acute lower respiratory infection: Secondary | ICD-10-CM | POA: Diagnosis present

## 2015-07-08 DIAGNOSIS — B1710 Acute hepatitis C without hepatic coma: Secondary | ICD-10-CM

## 2015-07-08 DIAGNOSIS — R0789 Other chest pain: Secondary | ICD-10-CM | POA: Diagnosis present

## 2015-07-08 DIAGNOSIS — J45909 Unspecified asthma, uncomplicated: Secondary | ICD-10-CM | POA: Diagnosis present

## 2015-07-08 DIAGNOSIS — E039 Hypothyroidism, unspecified: Secondary | ICD-10-CM | POA: Diagnosis present

## 2015-07-08 DIAGNOSIS — B1920 Unspecified viral hepatitis C without hepatic coma: Secondary | ICD-10-CM | POA: Diagnosis present

## 2015-07-08 DIAGNOSIS — Z794 Long term (current) use of insulin: Secondary | ICD-10-CM | POA: Diagnosis not present

## 2015-07-08 DIAGNOSIS — Z9221 Personal history of antineoplastic chemotherapy: Secondary | ICD-10-CM | POA: Diagnosis not present

## 2015-07-08 DIAGNOSIS — R079 Chest pain, unspecified: Secondary | ICD-10-CM | POA: Diagnosis not present

## 2015-07-08 DIAGNOSIS — R6510 Systemic inflammatory response syndrome (SIRS) of non-infectious origin without acute organ dysfunction: Secondary | ICD-10-CM | POA: Insufficient documentation

## 2015-07-08 DIAGNOSIS — Z79891 Long term (current) use of opiate analgesic: Secondary | ICD-10-CM | POA: Diagnosis not present

## 2015-07-08 DIAGNOSIS — C787 Secondary malignant neoplasm of liver and intrahepatic bile duct: Secondary | ICD-10-CM | POA: Diagnosis present

## 2015-07-08 DIAGNOSIS — J189 Pneumonia, unspecified organism: Secondary | ICD-10-CM | POA: Diagnosis present

## 2015-07-08 DIAGNOSIS — Z800 Family history of malignant neoplasm of digestive organs: Secondary | ICD-10-CM | POA: Diagnosis not present

## 2015-07-08 DIAGNOSIS — Z923 Personal history of irradiation: Secondary | ICD-10-CM | POA: Diagnosis not present

## 2015-07-08 DIAGNOSIS — Z79899 Other long term (current) drug therapy: Secondary | ICD-10-CM | POA: Diagnosis not present

## 2015-07-08 LAB — CBC
HCT: 34.8 % — ABNORMAL LOW (ref 39.0–52.0)
Hemoglobin: 11.8 g/dL — ABNORMAL LOW (ref 13.0–17.0)
MCH: 31.1 pg (ref 26.0–34.0)
MCHC: 33.9 g/dL (ref 30.0–36.0)
MCV: 91.6 fL (ref 78.0–100.0)
PLATELETS: 189 10*3/uL (ref 150–400)
RBC: 3.8 MIL/uL — ABNORMAL LOW (ref 4.22–5.81)
RDW: 15.8 % — AB (ref 11.5–15.5)
WBC: 6.5 10*3/uL (ref 4.0–10.5)

## 2015-07-08 LAB — COMPREHENSIVE METABOLIC PANEL
ALT: 16 U/L — AB (ref 17–63)
AST: 20 U/L (ref 15–41)
Albumin: 2.9 g/dL — ABNORMAL LOW (ref 3.5–5.0)
Alkaline Phosphatase: 75 U/L (ref 38–126)
Anion gap: 8 (ref 5–15)
BUN: 7 mg/dL (ref 6–20)
CHLORIDE: 107 mmol/L (ref 101–111)
CO2: 25 mmol/L (ref 22–32)
CREATININE: 0.64 mg/dL (ref 0.61–1.24)
Calcium: 8.4 mg/dL — ABNORMAL LOW (ref 8.9–10.3)
GFR calc Af Amer: >60 mL/min (ref >60)
Glucose, Bld: 104 mg/dL — ABNORMAL HIGH (ref 65–99)
Potassium: 4.2 mmol/L (ref 3.5–5.1)
Sodium: 140 mmol/L (ref 135–145)
Total Bilirubin: 0.5 mg/dL (ref 0.3–1.2)
Total Protein: 6.7 g/dL (ref 6.5–8.1)

## 2015-07-08 LAB — TROPONIN I
Troponin I: <0.03 ng/mL (ref <0.031)
Troponin I: <0.03 ng/mL (ref <0.031)

## 2015-07-08 LAB — GLUCOSE, CAPILLARY
Glucose-Capillary: 131 mg/dL — ABNORMAL HIGH (ref 65–99)
Glucose-Capillary: 161 mg/dL — ABNORMAL HIGH (ref 65–99)
Glucose-Capillary: 171 mg/dL — ABNORMAL HIGH (ref 65–99)
Glucose-Capillary: 108 mg/dL — ABNORMAL HIGH (ref 65–99)

## 2015-07-08 LAB — TSH: TSH: 1.711 u[IU]/mL (ref 0.350–4.500)

## 2015-07-08 LAB — LACTIC ACID, PLASMA: Lactic Acid, Venous: 1.8 mmol/L (ref 0.5–2.0)

## 2015-07-08 MED ORDER — LEDIPASVIR-SOFOSBUVIR 90-400 MG PO TABS
1.0000 | ORAL_TABLET | Freq: Every day | ORAL | Status: DC
  Administered 2015-07-08 – 2015-07-09 (×2): 1 via ORAL

## 2015-07-08 MED ORDER — ALBUTEROL SULFATE (2.5 MG/3ML) 0.083% IN NEBU: 2.5000 mg | INHALATION_SOLUTION | Freq: Four times a day (QID) | RESPIRATORY_TRACT | Status: DC | PRN

## 2015-07-08 MED ORDER — ONDANSETRON HCL 4 MG PO TABS: 4.0000 mg | ORAL_TABLET | Freq: Four times a day (QID) | ORAL | Status: DC | PRN

## 2015-07-08 MED ORDER — INSULIN ASPART 100 UNIT/ML ~~LOC~~ SOLN
0.0000 [IU] | Freq: Three times a day (TID) | SUBCUTANEOUS | Status: DC
  Administered 2015-07-08: 2 [IU] via SUBCUTANEOUS
  Administered 2015-07-08 (×2): 3 [IU] via SUBCUTANEOUS
  Administered 2015-07-09 (×2): 2 [IU] via SUBCUTANEOUS

## 2015-07-08 MED ORDER — SODIUM CHLORIDE 0.9% FLUSH: 3.0000 mL | Freq: Two times a day (BID) | INTRAVENOUS | Status: DC

## 2015-07-08 MED ORDER — VANCOMYCIN HCL IN DEXTROSE 1-5 GM/200ML-% IV SOLN
1000.0000 mg | Freq: Three times a day (TID) | INTRAVENOUS | Status: DC
  Administered 2015-07-08 – 2015-07-09 (×4): 1000 mg via INTRAVENOUS
  Filled 2015-07-08 (×4): qty 200

## 2015-07-08 MED ORDER — MUSCLE RUB 10-15 % EX CREA
TOPICAL_CREAM | CUTANEOUS | Status: DC | PRN
  Administered 2015-07-09: 12:00:00 via TOPICAL
  Filled 2015-07-08: qty 85

## 2015-07-08 MED ORDER — TIOTROPIUM BROMIDE MONOHYDRATE 18 MCG IN CAPS
18.0000 ug | ORAL_CAPSULE | Freq: Every day | RESPIRATORY_TRACT | Status: DC
  Administered 2015-07-08 – 2015-07-09 (×2): 18 ug via RESPIRATORY_TRACT
  Filled 2015-07-08: qty 5

## 2015-07-08 MED ORDER — ONDANSETRON HCL 4 MG/2ML IJ SOLN: 4.0000 mg | Freq: Four times a day (QID) | INTRAMUSCULAR | Status: DC | PRN

## 2015-07-08 MED ORDER — IOPAMIDOL (ISOVUE-370) INJECTION 76%: 100.0000 mL | Freq: Once | INTRAVENOUS | Status: DC | PRN

## 2015-07-08 MED ORDER — ALPRAZOLAM 1 MG PO TABS: 1.0000 mg | ORAL_TABLET | Freq: Three times a day (TID) | ORAL | Status: DC | PRN

## 2015-07-08 MED ORDER — INSULIN ASPART 100 UNIT/ML ~~LOC~~ SOLN: 0.0000 [IU] | Freq: Every day | SUBCUTANEOUS | Status: DC

## 2015-07-08 MED ORDER — DOCUSATE SODIUM 100 MG PO CAPS
100.0000 mg | ORAL_CAPSULE | Freq: Two times a day (BID) | ORAL | Status: DC
  Administered 2015-07-08 – 2015-07-09 (×4): 100 mg via ORAL
  Filled 2015-07-08 (×4): qty 1

## 2015-07-08 MED ORDER — ENOXAPARIN SODIUM 40 MG/0.4ML ~~LOC~~ SOLN
40.0000 mg | SUBCUTANEOUS | Status: DC
  Administered 2015-07-08 – 2015-07-09 (×2): 40 mg via SUBCUTANEOUS
  Filled 2015-07-08 (×3): qty 0.4

## 2015-07-08 MED ORDER — FLUOXETINE HCL 20 MG PO CAPS
20.0000 mg | ORAL_CAPSULE | Freq: Every day | ORAL | Status: DC
  Administered 2015-07-08 – 2015-07-09 (×2): 20 mg via ORAL
  Filled 2015-07-08 (×2): qty 1

## 2015-07-08 MED ORDER — SODIUM CHLORIDE 0.9 % IV SOLN
INTRAVENOUS | Status: DC
Start: 2015-07-08 — End: 2015-07-08

## 2015-07-08 MED ORDER — SODIUM CHLORIDE 0.9 % IV SOLN
INTRAVENOUS | Status: DC
  Administered 2015-07-08 (×2): via INTRAVENOUS

## 2015-07-08 MED ORDER — PIPERACILLIN-TAZOBACTAM 3.375 G IVPB
3.3750 g | Freq: Three times a day (TID) | INTRAVENOUS | Status: DC
  Administered 2015-07-08 – 2015-07-09 (×4): 3.375 g via INTRAVENOUS
  Filled 2015-07-08 (×4): qty 50

## 2015-07-08 MED ORDER — LEVOTHYROXINE SODIUM 25 MCG PO TABS
25.0000 ug | ORAL_TABLET | Freq: Every day | ORAL | Status: DC
  Administered 2015-07-08 – 2015-07-09 (×2): 25 ug via ORAL
  Filled 2015-07-08 (×2): qty 1

## 2015-07-08 MED ORDER — SODIUM CHLORIDE 0.9 % IV BOLUS (SEPSIS): 1000.0000 mL | INTRAVENOUS | Status: DC | PRN

## 2015-07-08 NOTE — Progress Notes (Signed)
Pharmacy Antibiotic Note  Brent Franco is a 65 y.o. male admitted on 07/07/2015 with sepsis.  Pharmacy has been consulted for vancomycin and Zosyn dosing.  Pt received vancomycin 1500 mg IV x 1 at 2348 and Zosyn 3.375g IV x 1 over 30 min at 2305 in the ED  Plan: Vancomycin 1000 mg IV every 8 hours.  Goal trough 15-20 mcg/mL.  Zosyn 3.375g IV every 8 hours infused over 4 hours  Height: 6\' 1"  (185.4 cm) Weight: 168 lb (76.204 kg) IBW/kg (Calculated) : 79.9  Temp (24hrs), Avg:99.4 F (37.4 C), Min:98.2 F (36.8 C), Max:101.8 F (38.8 C)   Recent Labs Lab 07/07/15 2021 07/07/15 2034 07/07/15 2357  WBC 9.0  --   --   CREATININE 0.68  --   --   LATICACIDVEN 2.1* 2.25* 1.8    Estimated Creatinine Clearance: 101.9 mL/min (by C-G formula based on Cr of 0.68).    Allergies  Allergen Reactions  . Demerol [Meperidine] Rash    Antimicrobials this admission: vancomycin 4/29 >>  Zosyn 4/29 >>   Microbiology results: 4/29 BCx x 2: NGTD  Thank you for allowing pharmacy to be a part of this patient's care.  Brent Franco 07/08/2015 1:25 AM

## 2015-07-08 NOTE — Progress Notes (Signed)
Pt refusing telemetry. Dr Candiss Norse ok for tele to be removed

## 2015-07-08 NOTE — Progress Notes (Addendum)
PROGRESS NOTE                                                                                                                                                                                                             Patient Demographics:    Brent Franco, is a 65 y.o. male, DOB - 10-30-1950, [id number]  Admit date - 07/07/2015   Admitting Physician Orvan Falconer, MD  Outpatient Primary MD for the patient is [name], NP  LOS - 0  Outpatient Specialists: Dr Whitney Muse  Chief Complaint  Patient presents with  . Chest Pain       Brief Narrative   65 y.o. male with medical history significant of COPD, Hep C on Harvoni, depression, esophageal CA recently finished chemotherapy under the care of Dr. Whitney Muse, right subclavian Portacath in situ, and DM type 2 on Glucophage, who presented with complaints of sudden onset, intermittent, stabbing, left-sided chest pain onset at [facility] on 4/29. Underwent CT angiogram of the chest which showed evidence of pneumonia, he was also found to have slightly low blood pressure and diffuse pulmonary nodules. Admitted for sepsis due to pneumonia.   Subjective:    Brent Franco today has, No headache, No chest pain, No abdominal pain - No Nausea, No new weakness tingling or numbness, much improved Cough - SOB.     Assessment  & Plan :     1. Atypical chest pain due to pneumonia, sepsis caused by pneumonia. Clinically much better after receiving IV fluids, continue empiric antibiotics which are vancomycin and Zosyn, blood and sputum cultures ordered, appears nontoxic at this time. Troponin negative 2. He is now symptom free this morning and feels much better. Continue supportive care with gentle hydration and antibiotics along with oxygen and nebulizer treatments if needed. Sepsis physiology has resolved, lactate normal.  2. History of poorly differentiated esophageal carcinoma with possible liver  metastasis. Under the care of Dr. Whitney Muse, apparently he has finished chemoradiation per family, postdischarge follow with oncology team.  3. DM type II. Currently on sliding scale. Monitor CBGs. Hold Glucophage.  4. Questionable lung lesions on CT scan. Kindly review report in detail, he will require repeat CT scan chest abdomen pelvis in 2-3 weeks.  5. Hypothyroidism. On Synthroid, TSH stable at 1.7.  6. History of hepatitis C. On Harwani treatments.  7. Atypical  chest pain. Brought on by pneumonia, troponin negative 2, pain-free now after pneumonia treatment and supportive care, EKG nonacute.    Code Status : Full  Family Communication  : wife bedside  Disposition Plan  : Home 1-2 days  Barriers For Discharge : PNA  Consults  : None  Procedures  :   CTA chest - PNA, nodlues  DVT Prophylaxis  :  Lovenox    Lab Results  Component Value Date   PLT 189 07/08/2015    Antibiotics  :    Anti-infectives    Start     Dose/Rate Route Frequency Ordered Stop   07/08/15 1000  Ledipasvir-Sofosbuvir 90-400 MG TABS 1 tablet     1 tablet Oral Daily 07/08/15 0113     07/08/15 0800  vancomycin (VANCOCIN) IVPB 1000 mg/200 mL premix     1,000 mg 200 mL/hr over 60 Minutes Intravenous Every 8 hours 07/08/15 0123     07/08/15 0700  piperacillin-tazobactam (ZOSYN) IVPB 3.375 g     3.375 g 12.5 mL/hr over 240 Minutes Intravenous Every 8 hours 07/08/15 0120     07/07/15 2300  piperacillin-tazobactam (ZOSYN) IVPB 3.375 g     3.375 g 100 mL/hr over 30 Minutes Intravenous  Once 07/07/15 2252 07/07/15 2332   07/07/15 2300  vancomycin (VANCOCIN) 1,500 mg in sodium chloride 0.9 % 500 mL IVPB     1,500 mg 250 mL/hr over 120 Minutes Intravenous  Once 07/07/15 2252 07/08/15 0148        Objective:   Filed Vitals:   07/07/15 2330 07/08/15 0000 07/08/15 0058 07/08/15 0656  BP: 92/60 88/60 103/62 105/60  Pulse: 97 95 90 96  Temp:   98.2 F (36.8 C) 98 F (36.7 C)  TempSrc:   Oral     Resp: 20 18 18    Height:      Weight:      SpO2: 94% 94% 97% 100%    Wt Readings from Last 3 Encounters:  07/07/15 76.204 kg (168 lb)  07/04/15 76.703 kg (169 lb 1.6 oz)  06/13/15 76.295 kg (168 lb 3.2 oz)     Intake/Output Summary (Last 24 hours) at 07/08/15 0729 Last data filed at 07/07/15 2359  Gross per 24 hour  Intake   2000 ml  Output      0 ml  Net   2000 ml     Physical Exam  Awake Alert, Oriented X 3, No new F.N deficits, Normal affect [place].AT,PERRAL Supple Neck,No JVD, No cervical lymphadenopathy appriciated.  Symmetrical Chest wall movement, Good air movement bilaterally, few crackles, R. Port.Cath RRR,No Gallops,Rubs or new Murmurs, No Parasternal Heave +ve B.Sounds, Abd Soft, No tenderness, No organomegaly appriciated, No rebound - guarding or rigidity. No Cyanosis, Clubbing or edema, No new Rash or bruise       Data Review:    CBC  Recent Labs Lab 07/07/15 2021 07/08/15 0645  WBC 9.0 6.5  HGB 11.8* 11.8*  HCT 34.8* 34.8*  PLT 196 189  MCV 90.6 91.6  MCH 30.7 31.1  MCHC 33.9 33.9  RDW 15.7* 15.8*  LYMPHSABS 1.1  --   MONOABS 1.3*  --   EOSABS 0.3  --   BASOSABS 0.0  --     Chemistries   Recent Labs Lab 07/07/15 2021 07/08/15 0645  NA 136 140  K 4.1 4.2  CL 103 107  CO2 24 25  GLUCOSE 135* 104*  BUN 10 7  CREATININE 0.68 0.64  CALCIUM 8.7* 8.4*  AST 27 20  ALT 20 16*  ALKPHOS 85 75  BILITOT 0.4 0.5   ------------------------------------------------------------------------------------------------------------------ No results for input(s): CHOL, HDL, LDLCALC, TRIG, CHOLHDL, LDLDIRECT in the last 72 hours.  Lab Results  Component Value Date   HGBA1C 8.3* 04/15/2015   ------------------------------------------------------------------------------------------------------------------  Recent Labs  07/08/15 0222  TSH 1.711    ------------------------------------------------------------------------------------------------------------------ No results for input(s): VITAMINB12, FOLATE, FERRITIN, TIBC, IRON, RETICCTPCT in the last 72 hours.  Coagulation profile No results for input(s): INR, PROTIME in the last 168 hours.  No results for input(s): DDIMER in the last 72 hours.  Cardiac Enzymes  Recent Labs Lab 07/08/15 0222 07/08/15 0645  TROPONINI <0.03 <0.03   ------------------------------------------------------------------------------------------------------------------ No results found for: BNP  Inpatient Medications  Scheduled Meds: . docusate sodium  100 mg Oral BID  . enoxaparin (LOVENOX) injection  40 mg Subcutaneous Q24H  . FLUoxetine  20 mg Oral Daily  . insulin aspart  0-15 Units Subcutaneous TID WC  . insulin aspart  0-5 Units Subcutaneous QHS  . Ledipasvir-Sofosbuvir  1 tablet Oral Daily  . levothyroxine  25 mcg Oral QAC breakfast  . piperacillin-tazobactam (ZOSYN)  IV  3.375 g Intravenous Q8H  . sodium chloride flush  3 mL Intravenous Q12H  . tiotropium  18 mcg Inhalation Daily  . vancomycin  1,000 mg Intravenous Q8H   Continuous Infusions: . sodium chloride     PRN Meds:.albuterol, ALPRAZolam, iopamidol, [DISCONTINUED] ondansetron **OR** ondansetron (ZOFRAN) IV, sodium chloride  Micro Results Recent Results (from the past 240 hour(s))  Blood culture (routine x 2)     Status: None (Preliminary result)   Collection Time: 07/07/15  8:21 PM  Result Value Ref Range Status   Specimen Description BLOOD RIGHT HAND  Final   Special Requests BOTTLES DRAWN AEROBIC AND ANAEROBIC 8CC EACH  Final   Culture NO GROWTH < 12 HOURS  Final   Report Status PENDING  Incomplete  Blood culture (routine x 2)     Status: None (Preliminary result)   Collection Time: 07/07/15 11:57 PM  Result Value Ref Range Status   Specimen Description RIGHT ANTECUBITAL  Final   Special Requests BOTTLES DRAWN  AEROBIC AND ANAEROBIC 8CC EACH  Final   Culture NO GROWTH < 12 HOURS  Final   Report Status PENDING  Incomplete    Radiology Reports Dg Chest 2 View  07/07/2015  CLINICAL DATA:  Chest pain. EXAM: CHEST  2 VIEW COMPARISON:  04/17/2015 FINDINGS: Right-sided porta catheter with tip at the upper cavoatrial junction. Airspace opacity seen previously is resolved. There is no edema, consolidation, effusion, or pneumothorax. Chronic hyperinflation. Normal heart size and mediastinal contours. IMPRESSION: No evidence of active disease. Electronically Signed   By: Monte Fantasia M.D.   On: 07/07/2015 21:01   Ct Angio Chest Pe W/cm &/or Wo Cm  07/08/2015  CLINICAL DATA:  Left chest pain. Esophageal cancer. Cough and fever. EXAM: CT ANGIOGRAPHY CHEST WITH CONTRAST TECHNIQUE: Multidetector CT imaging of the chest was performed using the standard protocol during bolus administration of intravenous contrast. Multiplanar CT image reconstructions and MIPs were obtained to evaluate the vascular anatomy. CONTRAST:  Dose currently not available, reference EMR. COMPARISON:  03/08/2015 FINDINGS: THORACIC INLET/BODY WALL: Right-sided porta catheter with tip at the upper right atrium. MEDIASTINUM: Normal heart size. No pericardial effusion. No acute vascular abnormality, including pulmonary embolism. Aortic atherosclerosis without acute finding. Lower esophageal thickening correlating with known mass. No definitive malignant adenopathy. Evaluation of mediastinal lymph nodes is limited in the setting  of pulmonary infection. LUNG WINDOWS: Airspace disease in the lower lobes and less extensive in the lingula and right middle lobe. There is patchy ground-glass opacity in the apical lungs. Scattered small nodules, most suspicious is a rounded 1 cm nodule in the right upper lobe 6:30. UPPER ABDOMEN: Subtle low-density in the posterior right liver which is not in the same location as suspected metastasis on 04/30/2015 MRI. OSSEOUS: No  acute fracture.  No suspicious lytic or blastic lesions. Review of the MIP images confirms the above findings. IMPRESSION: 1. Patchy bilateral pneumonia. 2. Negative for pulmonary embolism. 3. Scattered pulmonary nodules, up to 1 cm in the right upper lobe, could be infectious but needs chest CT after treatment to exclude new pulmonary metastases. At the same time, recommend updated abdominal imaging as there is suggestion of a new liver lesion. Electronically Signed   By: Monte Fantasia M.D.   On: 07/08/2015 01:02    Time Spent in minutes  30   Ameliana Brashear K M.D on 07/08/2015 at 7:29 AM  Between 7am to 7pm - Pager - [phone]  After 7pm go to [email or web address] - password [facility]  [facility] -  Office  [phone]

## 2015-07-09 ENCOUNTER — Encounter (HOSPITAL_COMMUNITY): Payer: Self-pay | Admitting: Internal Medicine

## 2015-07-09 ENCOUNTER — Inpatient Hospital Stay (HOSPITAL_COMMUNITY): Payer: Medicaid Other

## 2015-07-09 DIAGNOSIS — J189 Pneumonia, unspecified organism: Principal | ICD-10-CM

## 2015-07-09 DIAGNOSIS — A419 Sepsis, unspecified organism: Secondary | ICD-10-CM

## 2015-07-09 DIAGNOSIS — R918 Other nonspecific abnormal finding of lung field: Secondary | ICD-10-CM

## 2015-07-09 DIAGNOSIS — R079 Chest pain, unspecified: Secondary | ICD-10-CM | POA: Diagnosis present

## 2015-07-09 DIAGNOSIS — E119 Type 2 diabetes mellitus without complications: Secondary | ICD-10-CM

## 2015-07-09 DIAGNOSIS — Z794 Long term (current) use of insulin: Secondary | ICD-10-CM

## 2015-07-09 HISTORY — DX: Other nonspecific abnormal finding of lung field: R91.8

## 2015-07-09 LAB — BASIC METABOLIC PANEL
Anion gap: 9 (ref 5–15)
CALCIUM: 8.8 mg/dL — AB (ref 8.9–10.3)
CO2: 27 mmol/L (ref 22–32)
CREATININE: 0.69 mg/dL (ref 0.61–1.24)
Chloride: 104 mmol/L (ref 101–111)
GFR calc non Af Amer: >60 mL/min (ref >60)
Glucose, Bld: 117 mg/dL — ABNORMAL HIGH (ref 65–99)
Potassium: 4 mmol/L (ref 3.5–5.1)
SODIUM: 140 mmol/L (ref 135–145)

## 2015-07-09 LAB — HEMOGLOBIN A1C
Hgb A1c MFr Bld: 7.4 % — ABNORMAL HIGH (ref 4.8–5.6)
MEAN PLASMA GLUCOSE: 166 mg/dL

## 2015-07-09 LAB — CBC
HCT: 34.8 % — ABNORMAL LOW (ref 39.0–52.0)
Hemoglobin: 11.8 g/dL — ABNORMAL LOW (ref 13.0–17.0)
MCH: 30.9 pg (ref 26.0–34.0)
MCHC: 33.9 g/dL (ref 30.0–36.0)
MCV: 91.1 fL (ref 78.0–100.0)
PLATELETS: 207 10*3/uL (ref 150–400)
RBC: 3.82 MIL/uL — ABNORMAL LOW (ref 4.22–5.81)
RDW: 15.7 % — AB (ref 11.5–15.5)
WBC: 6.5 10*3/uL (ref 4.0–10.5)

## 2015-07-09 LAB — GLUCOSE, CAPILLARY
GLUCOSE-CAPILLARY: 125 mg/dL — AB (ref 65–99)
Glucose-Capillary: 136 mg/dL — ABNORMAL HIGH (ref 65–99)

## 2015-07-09 LAB — EXPECTORATED SPUTUM ASSESSMENT W REFEX TO RESP CULTURE

## 2015-07-09 MED ORDER — HEPARIN SOD (PORK) LOCK FLUSH 100 UNIT/ML IV SOLN
500.0000 [IU] | INTRAVENOUS | Status: AC | PRN
  Administered 2015-07-09: 500 [IU]

## 2015-07-09 MED ORDER — LEVOFLOXACIN 750 MG PO TABS: 750.0000 mg | ORAL_TABLET | Freq: Every day | ORAL | Status: DC

## 2015-07-09 MED ORDER — LEDIPASVIR-SOFOSBUVIR 90-400 MG PO TABS
1.0000 | ORAL_TABLET | Freq: Every day | ORAL | Status: DC
  Administered 2015-07-09: 1 via ORAL

## 2015-07-09 NOTE — Discharge Summary (Signed)
Physician Discharge Summary  Brent Franco [id number] DOB: June 30, 1950 DOA: 07/07/2015  PCP: [name], NP  Admit date: 07/07/2015 Discharge date: 07/09/2015  Time spent: Greater than 30 minutes  Recommendations for Outpatient Follow-up:  1. Recommend follow-up CT of the chest in 3-6 months to evaluate for interval changes or clearing of the pulmonary nodules in the right upper lobe of the lung.   Discharge Diagnoses:  1. Atypical chest pain, secondary to healthcare associated pneumonia. 2. Healthcare associated pneumonia. 3. Sepsis secondary to HCAP. 4. Scattered pulmonary nodules in the right upper lobe of the lung. 5. Poorly differentiated esophageal carcinoma with possible liver metastasis. -Status post chemotherapy and XRT. 7. Hepatitis C on Harvoni. 8. 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 9. Hypothyroidism. TSH was within normal limits at 1.7.   Discharge Condition: Improved  Diet recommendation: Carbohydrate modified  Filed Weights   07/07/15 2000  Weight: 76.204 kg (168 lb)    History of present illness:  Brent Franco is a 65 y.o. male with medical history significant of COPD, Hep C on Harvoni, depression, esophageal CA recently finished chemotherapy under the care of Dr. Whitney Muse, right subclavian Portacath in situ,and DM type 2 on Glucophage and sliding scale Humulin R, who presented with complaints of sudden onset, intermittent, stabbing, left-sided chest pain. CT angiogram of the chest was ordered and revealed no PE, but with evidence of pneumonia and diffuse right lung pulmonary nodules.Marland Kitchen He was also found to be febrile with a temperature of 101.8 and hypotensive with a blood pressure of 88/60. He was admitted for further evaluation and management.  Hospital Course:  Patient was started on vigorous IV fluids, vancomycin, and Zosyn. Blood cultures were ordered. Metformin was withheld and his diabetes was treated with sliding-scale NovoLog. He was  continued on Synthroid for hypothyroidism. His TSH was assessed and was within normal limits. Although his chest pain was thought to be atypical, cardiac enzymes were ordered for further evaluation. History pontine I was negative 3. The patient improved clinically and symptomatically. Sepsis parameters resolved. He became afebrile. His blood pressure improved. His lactate level normalized. He was oxygenating in the mid to upper 90s on room air on the day of discharge. Blood cultures were negative to date. Follow-up chest x-ray revealed some improvement. He received 2-1/2 days of IV vancomycin and Zosyn. He was discharged on 5 more days of Levaquin. Of note, there were pulmonary nodules noted on the CT angiogram of the chest. Given his history of esophageal cancer, repeat imaging is recommended. The imaging modality will be deferred to his oncologist or radiation oncologist.  Procedures:  None  Consultations:  None  Discharge Exam: Filed Vitals:   07/08/15 2015 07/09/15 0650  BP: 125/67 115/65  Pulse: 97 85  Temp: 97.3 F (36.3 C) 98.1 F (36.7 C)  Resp: 20 15  Oxygen saturation 93% on room air.  General: Pleasant alert 65 year old man in no acute distress. Cardiovascular: S1, S2, no murmurs rubs or gallops. Respiratory: Occasional bibasilar crackles; breathing nonlabored.  Discharge Instructions   Discharge Instructions    Diet Carb Modified    Complete by:  As directed      Increase activity slowly    Complete by:  As directed           Current Discharge Medication List    START taking these medications   Details  levofloxacin (LEVAQUIN) 750 MG tablet Take 1 tablet (750 mg total) by mouth daily. Antibiotic to be taken for 5 more  days. Otho Darner: 5 tablet, Refills: 0      CONTINUE these medications which have NOT CHANGED   Details  acetaminophen (TYLENOL) 500 MG tablet Take 500 mg by mouth every 6 (six) hours as needed for fever.    albuterol (PROVENTIL HFA;VENTOLIN  HFA) 108 (90 BASE) MCG/ACT inhaler Inhale 1 puff into the lungs every 6 (six) hours as needed for wheezing or shortness of breath.    ALPRAZolam (XANAX) 1 MG tablet Take 1 tablet (1 mg total) by mouth every 8 (eight) hours as needed for anxiety. Qty: 90 tablet, Refills: 1   Associated Diagnoses: Depression    docusate sodium (COLACE) 100 MG capsule Take 100 mg by mouth 2 (two) times daily. Refills: 4    FLUoxetine (PROZAC) 20 MG capsule Take 1 capsule (20 mg total) by mouth daily. Qty: 30 capsule, Refills: 3   Associated Diagnoses: Depression    HUMULIN R 100 UNIT/ML injection Inject 2-12 Units into the skin 2 (two) times daily as needed for high blood sugar. 151-200 = 2 units, 201-250 = 4 units, 251-300 = 6 units, 301-350 = 8 units, 351-400 = 10 units, 401-450 = 12 units. Refills: 2    ipratropium-albuterol (DUONEB) 0.5-2.5 (3) MG/3ML SOLN INHALE CONTENTS OF 1 VIAL IN NEBULIZER EVERY 4 HOURS AS NEEDED FOR SHORTNESS OF BREATH. Refills: 3    ketoconazole (NIZORAL) 2 % cream Apply 1 application topically daily as needed for irritation.  Refills: 6    Ledipasvir-Sofosbuvir (HARVONI) 90-400 MG TABS Take 1 tablet by mouth daily.     levothyroxine (SYNTHROID, LEVOTHROID) 25 MCG tablet Take 25 mcg by mouth daily. Refills: 5    lidocaine (XYLOCAINE) 2 % solution MIX 50/50 WITH WATER AND SWALLOW 10MLS 30 MINUTES BEFORE MEALS AND 30 MINUTES BEFORE BEDTIME Refills: 3    lidocaine-prilocaine (EMLA) cream Apply a quarter size amount to port site 1 hour prior to chemo. Do not rub in. Cover with plastic wrap. Qty: 30 g, Refills: 3   Associated Diagnoses: Mucosal abnormality of esophagus; Malignant neoplasm of lower third of esophagus (HCC)    meloxicam (MOBIC) 7.5 MG tablet TAKE ONE TABLET BY MOUTH ONCE DAILY Refills: 6    metFORMIN (GLUCOPHAGE) 500 MG tablet Take 500 mg by mouth 2 (two) times daily. Refills: 3    mometasone (NASONEX) 50 MCG/ACT nasal spray Place 2 sprays into the nose daily  as needed (congestion).     nitroGLYCERIN (NITROSTAT) 0.4 MG SL tablet Place 1 tablet (0.4 mg total) under the tongue every 5 (five) minutes as needed for chest pain. Qty: 25 tablet, Refills: 3    ondansetron (ZOFRAN) 8 MG tablet TAKE 1 TABLET (8 MG TOTAL) BY MOUTH EVERY 8 (EIGHT) HOURS AS NEEDED FOR NAUSEA OR VOMITING. Qty: 30 tablet, Refills: 2    oxyCODONE (OXYCONTIN) 20 mg 12 hr tablet Take 1 tablet (20 mg total) by mouth every 8 (eight) hours. Qty: 90 tablet, Refills: 0   Associated Diagnoses: Malignant neoplasm of lower third of esophagus (HCC)    oxyCODONE-acetaminophen (ROXICET) 5-325 MG tablet Take 1-2 tablets by mouth every 6 (six) hours as needed for moderate pain or severe pain. Qty: 60 tablet, Refills: 0   Associated Diagnoses: Esophageal pain; Malignant neoplasm of lower third of esophagus ([place]); Elevated transaminase level; Liver lesion    prochlorperazine (COMPAZINE) 10 MG tablet TAKE 1 TABLET (10 MG TOTAL) BY MOUTH EVERY 6 (SIX) HOURS AS NEEDED FOR NAUSEA OR VOMITING. Qty: 30 tablet, Refills: 2    SPIRIVA HANDIHALER 18  MCG inhalation capsule Place 1 puff into inhaler and inhale daily. Refills: 5    sucralfate (CARAFATE) 1 g tablet Take 1 tablet by mouth 2 (two) times daily. Refills: 5   Associated Diagnoses: Malignant neoplasm of lower third of esophagus (HCC)    ACCU-CHEK AVIVA PLUS test strip 2 (two) times daily. use for testing Refills: 2       Allergies  Allergen Reactions  . Demerol [Meperidine] Rash   Follow-up Information    Follow up with [name], NP. Schedule an appointment as soon as possible for a visit in 1 week.   Specialty:  Nurse Practitioner   Contact information:   [address] [place] [address] [phone]        The results of significant diagnostics from this hospitalization (including imaging, microbiology, ancillary and laboratory) are listed below for reference.    Significant Diagnostic Studies: Dg Chest 2  View  07/09/2015  CLINICAL DATA:  Chest pain and sepsis EXAM: CHEST  2 VIEW COMPARISON:  4/20 9/7 knee, 07/07/1968 FINDINGS: Cardiac shadow is stable. Lungs are mildly hyperinflated. Persistent bilateral infiltrative changes are seen worse in the lower lobes bilaterally. There has been some improvement when compared with the prior CT. No other focal abnormality is noted. IMPRESSION: Persistent but mildly improved infiltrates bilaterally. Electronically Signed   By: Inez Catalina M.D.   On: 07/09/2015 12:40   Dg Chest 2 View  07/07/2015  CLINICAL DATA:  Chest pain. EXAM: CHEST  2 VIEW COMPARISON:  04/17/2015 FINDINGS: Right-sided porta catheter with tip at the upper cavoatrial junction. Airspace opacity seen previously is resolved. There is no edema, consolidation, effusion, or pneumothorax. Chronic hyperinflation. Normal heart size and mediastinal contours. IMPRESSION: No evidence of active disease. Electronically Signed   By: Monte Fantasia M.D.   On: 07/07/2015 21:01   Ct Angio Chest Pe W/cm &/or Wo Cm  07/08/2015  CLINICAL DATA:  Left chest pain. Esophageal cancer. Cough and fever. EXAM: CT ANGIOGRAPHY CHEST WITH CONTRAST TECHNIQUE: Multidetector CT imaging of the chest was performed using the standard protocol during bolus administration of intravenous contrast. Multiplanar CT image reconstructions and MIPs were obtained to evaluate the vascular anatomy. CONTRAST:  Dose currently not available, reference EMR. COMPARISON:  03/08/2015 FINDINGS: THORACIC INLET/BODY WALL: Right-sided porta catheter with tip at the upper right atrium. MEDIASTINUM: Normal heart size. No pericardial effusion. No acute vascular abnormality, including pulmonary embolism. Aortic atherosclerosis without acute finding. Lower esophageal thickening correlating with known mass. No definitive malignant adenopathy. Evaluation of mediastinal lymph nodes is limited in the setting of pulmonary infection. LUNG WINDOWS: Airspace disease in the  lower lobes and less extensive in the lingula and right middle lobe. There is patchy ground-glass opacity in the apical lungs. Scattered small nodules, most suspicious is a rounded 1 cm nodule in the right upper lobe 6:30. UPPER ABDOMEN: Subtle low-density in the posterior right liver which is not in the same location as suspected metastasis on 04/30/2015 MRI. OSSEOUS: No acute fracture.  No suspicious lytic or blastic lesions. Review of the MIP images confirms the above findings. IMPRESSION: 1. Patchy bilateral pneumonia. 2. Negative for pulmonary embolism. 3. Scattered pulmonary nodules, up to 1 cm in the right upper lobe, could be infectious but needs chest CT after treatment to exclude new pulmonary metastases. At the same time, recommend updated abdominal imaging as there is suggestion of a new liver lesion. Electronically Signed   By: Monte Fantasia M.D.   On: 07/08/2015 01:02  Microbiology: Recent Results (from the past 240 hour(s))  Blood culture (routine x 2)     Status: None (Preliminary result)   Collection Time: 07/07/15  8:21 PM  Result Value Ref Range Status   Specimen Description BLOOD RIGHT HAND  Final   Special Requests BOTTLES DRAWN AEROBIC AND ANAEROBIC 8CC EACH  Final   Culture NO GROWTH 2 DAYS  Final   Report Status PENDING  Incomplete  Blood culture (routine x 2)     Status: None (Preliminary result)   Collection Time: 07/07/15 11:57 PM  Result Value Ref Range Status   Specimen Description RIGHT ANTECUBITAL  Final   Special Requests BOTTLES DRAWN AEROBIC AND ANAEROBIC 8CC EACH  Final   Culture NO GROWTH 1 DAY  Final   Report Status PENDING  Incomplete     Labs: Basic Metabolic Panel:  Recent Labs Lab 07/07/15 2021 07/08/15 0645 07/09/15 0625  NA 136 140 140  K 4.1 4.2 4.0  CL 103 107 104  CO2 24 25 27   GLUCOSE 135* 104* 117*  BUN 10 7 <5*  CREATININE 0.68 0.64 0.69  CALCIUM 8.7* 8.4* 8.8*   Liver Function Tests:  Recent Labs Lab 07/07/15 2021  07/08/15 0645  AST 27 20  ALT 20 16*  ALKPHOS 85 75  BILITOT 0.4 0.5  PROT 7.4 6.7  ALBUMIN 3.2* 2.9*   No results for input(s): LIPASE, AMYLASE in the last 168 hours. No results for input(s): AMMONIA in the last 168 hours. CBC:  Recent Labs Lab 07/07/15 2021 07/08/15 0645 07/09/15 0625  WBC 9.0 6.5 6.5  NEUTROABS 6.3  --   --   HGB 11.8* 11.8* 11.8*  HCT 34.8* 34.8* 34.8*  MCV 90.6 91.6 91.1  PLT 196 189 207   Cardiac Enzymes:  Recent Labs Lab 07/08/15 0222 07/08/15 0645 07/08/15 1300  TROPONINI <0.03 <0.03 <0.03   BNP: BNP (last 3 results) No results for input(s): BNP in the last 8760 hours.  ProBNP (last 3 results) No results for input(s): PROBNP in the last 8760 hours.  CBG:  Recent Labs Lab 07/08/15 1134 07/08/15 1630 07/08/15 2057 07/09/15 0747 07/09/15 1135  GLUCAP 161* 171* 108* 125* 136*       Signed:  Adella Manolis MD.  [facility] 07/09/2015, 3:21 PM

## 2015-07-09 NOTE — Progress Notes (Signed)
Patient discharged home.  Port flushed with heparin and deaccessed - WNL.  Reviewed DC instructions and meds.  Verbalizes understanding. No questions at this time.  Assisted off unit via [place] by RN

## 2015-07-12 LAB — CULTURE, BLOOD (ROUTINE X 2): Culture: NO GROWTH

## 2015-07-14 LAB — CULTURE, BLOOD (ROUTINE X 2): Culture: NO GROWTH

## 2015-07-16 ENCOUNTER — Encounter (HOSPITAL_COMMUNITY): Payer: Self-pay | Admitting: Emergency Medicine

## 2015-07-16 ENCOUNTER — Emergency Department (HOSPITAL_COMMUNITY): Payer: Medicaid Other

## 2015-07-16 ENCOUNTER — Inpatient Hospital Stay (HOSPITAL_COMMUNITY)
Admission: EM | Admit: 2015-07-16 | Discharge: 2015-07-21 | DRG: 190 | Disposition: A | Discharge status: Home / Self Care | Payer: Medicaid Other | Attending: Family Medicine | Admitting: Family Medicine

## 2015-07-16 DIAGNOSIS — J189 Pneumonia, unspecified organism: Secondary | ICD-10-CM | POA: Diagnosis present

## 2015-07-16 DIAGNOSIS — Z6822 Body mass index (BMI) 22.0-22.9, adult: Secondary | ICD-10-CM

## 2015-07-16 DIAGNOSIS — Z79891 Long term (current) use of opiate analgesic: Secondary | ICD-10-CM | POA: Diagnosis not present

## 2015-07-16 DIAGNOSIS — C159 Malignant neoplasm of esophagus, unspecified: Secondary | ICD-10-CM | POA: Diagnosis present

## 2015-07-16 DIAGNOSIS — E440 Moderate protein-calorie malnutrition: Secondary | ICD-10-CM | POA: Insufficient documentation

## 2015-07-16 DIAGNOSIS — Z7951 Long term (current) use of inhaled steroids: Secondary | ICD-10-CM

## 2015-07-16 DIAGNOSIS — R0602 Shortness of breath: Secondary | ICD-10-CM | POA: Diagnosis present

## 2015-07-16 DIAGNOSIS — Z87891 Personal history of nicotine dependence: Secondary | ICD-10-CM | POA: Diagnosis not present

## 2015-07-16 DIAGNOSIS — B1920 Unspecified viral hepatitis C without hepatic coma: Secondary | ICD-10-CM | POA: Diagnosis present

## 2015-07-16 DIAGNOSIS — J9601 Acute respiratory failure with hypoxia: Secondary | ICD-10-CM | POA: Diagnosis not present

## 2015-07-16 DIAGNOSIS — Z800 Family history of malignant neoplasm of digestive organs: Secondary | ICD-10-CM

## 2015-07-16 DIAGNOSIS — J440 Chronic obstructive pulmonary disease with acute lower respiratory infection: Secondary | ICD-10-CM | POA: Diagnosis not present

## 2015-07-16 DIAGNOSIS — F329 Major depressive disorder, single episode, unspecified: Secondary | ICD-10-CM | POA: Diagnosis present

## 2015-07-16 DIAGNOSIS — Z923 Personal history of irradiation: Secondary | ICD-10-CM | POA: Diagnosis not present

## 2015-07-16 DIAGNOSIS — Z801 Family history of malignant neoplasm of trachea, bronchus and lung: Secondary | ICD-10-CM | POA: Diagnosis not present

## 2015-07-16 DIAGNOSIS — J45909 Unspecified asthma, uncomplicated: Secondary | ICD-10-CM | POA: Diagnosis present

## 2015-07-16 DIAGNOSIS — J181 Lobar pneumonia, unspecified organism: Secondary | ICD-10-CM | POA: Diagnosis present

## 2015-07-16 DIAGNOSIS — Z915 Personal history of self-harm: Secondary | ICD-10-CM | POA: Diagnosis not present

## 2015-07-16 DIAGNOSIS — R0902 Hypoxemia: Secondary | ICD-10-CM | POA: Diagnosis not present

## 2015-07-16 DIAGNOSIS — Z794 Long term (current) use of insulin: Secondary | ICD-10-CM | POA: Diagnosis not present

## 2015-07-16 DIAGNOSIS — Z9221 Personal history of antineoplastic chemotherapy: Secondary | ICD-10-CM

## 2015-07-16 DIAGNOSIS — G894 Chronic pain syndrome: Secondary | ICD-10-CM | POA: Diagnosis present

## 2015-07-16 DIAGNOSIS — E039 Hypothyroidism, unspecified: Secondary | ICD-10-CM | POA: Diagnosis present

## 2015-07-16 DIAGNOSIS — Y95 Nosocomial condition: Secondary | ICD-10-CM | POA: Diagnosis present

## 2015-07-16 DIAGNOSIS — E089 Diabetes mellitus due to underlying condition without complications: Secondary | ICD-10-CM

## 2015-07-16 DIAGNOSIS — E119 Type 2 diabetes mellitus without complications: Secondary | ICD-10-CM

## 2015-07-16 DIAGNOSIS — Z8619 Personal history of other infectious and parasitic diseases: Secondary | ICD-10-CM | POA: Diagnosis present

## 2015-07-16 DIAGNOSIS — C155 Malignant neoplasm of lower third of esophagus: Secondary | ICD-10-CM | POA: Diagnosis not present

## 2015-07-16 DIAGNOSIS — J449 Chronic obstructive pulmonary disease, unspecified: Secondary | ICD-10-CM | POA: Diagnosis not present

## 2015-07-16 LAB — URINALYSIS, ROUTINE W REFLEX MICROSCOPIC
BILIRUBIN URINE: NEGATIVE
Glucose, UA: NEGATIVE mg/dL
Hgb urine dipstick: NEGATIVE
Ketones, ur: NEGATIVE mg/dL
LEUKOCYTES UA: NEGATIVE
NITRITE: NEGATIVE
PH: 5.5 (ref 5.0–8.0)
Protein, ur: NEGATIVE mg/dL
SPECIFIC GRAVITY, URINE: 1.015 (ref 1.005–1.030)

## 2015-07-16 LAB — GLUCOSE, CAPILLARY: Glucose-Capillary: 131 mg/dL — ABNORMAL HIGH (ref 65–99)

## 2015-07-16 LAB — BLOOD GAS, VENOUS
Acid-base deficit: 0.3 mmol/L (ref 0.0–2.0)
Bicarbonate: 23 mEq/L (ref 20.0–24.0)
FIO2: 21
O2 Saturation: 67.1 %
PCO2 VEN: 49.6 mmHg (ref 45.0–50.0)
PH VEN: 7.323 — AB (ref 7.250–7.300)
pO2, Ven: 42.1 mmHg (ref 31.0–45.0)

## 2015-07-16 LAB — COMPREHENSIVE METABOLIC PANEL
ALT: 17 U/L (ref 17–63)
AST: 26 U/L (ref 15–41)
Albumin: 3.1 g/dL — ABNORMAL LOW (ref 3.5–5.0)
Alkaline Phosphatase: 94 U/L (ref 38–126)
Anion gap: 9 (ref 5–15)
BILIRUBIN TOTAL: 0.6 mg/dL (ref 0.3–1.2)
BUN: 10 mg/dL (ref 6–20)
CO2: 29 mmol/L (ref 22–32)
Calcium: 9 mg/dL (ref 8.9–10.3)
Chloride: 100 mmol/L — ABNORMAL LOW (ref 101–111)
Creatinine, Ser: 0.73 mg/dL (ref 0.61–1.24)
Glucose, Bld: 98 mg/dL (ref 65–99)
Potassium: 4.2 mmol/L (ref 3.5–5.1)
Sodium: 138 mmol/L (ref 135–145)
TOTAL PROTEIN: 7.9 g/dL (ref 6.5–8.1)

## 2015-07-16 LAB — CBC WITH DIFFERENTIAL/PLATELET
BASOS ABS: 0 10*3/uL (ref 0.0–0.1)
Basophils Relative: 0 %
Eosinophils Absolute: 0.7 10*3/uL (ref 0.0–0.7)
Eosinophils Relative: 9 %
HEMATOCRIT: 37.5 % — AB (ref 39.0–52.0)
Hemoglobin: 12.6 g/dL — ABNORMAL LOW (ref 13.0–17.0)
LYMPHS ABS: 1 10*3/uL (ref 0.7–4.0)
LYMPHS PCT: 14 %
MCH: 31 pg (ref 26.0–34.0)
MCHC: 33.6 g/dL (ref 30.0–36.0)
MCV: 92.4 fL (ref 78.0–100.0)
MONO ABS: 1.3 10*3/uL — AB (ref 0.1–1.0)
Monocytes Relative: 18 %
NEUTROS ABS: 4.5 10*3/uL (ref 1.7–7.7)
Neutrophils Relative %: 60 %
Platelets: 245 10*3/uL (ref 150–400)
RBC: 4.06 MIL/uL — AB (ref 4.22–5.81)
RDW: 15 % (ref 11.5–15.5)
WBC: 7.5 10*3/uL (ref 4.0–10.5)

## 2015-07-16 LAB — I-STAT TROPONIN, ED: Troponin i, poc: 0 ng/mL (ref 0.00–0.08)

## 2015-07-16 LAB — STREP PNEUMONIAE URINARY ANTIGEN: Strep Pneumo Urinary Antigen: NEGATIVE

## 2015-07-16 MED ORDER — LEVOTHYROXINE SODIUM 25 MCG PO TABS
25.0000 ug | ORAL_TABLET | Freq: Every day | ORAL | Status: DC
  Administered 2015-07-17 – 2015-07-21 (×5): 25 ug via ORAL
  Filled 2015-07-16 (×5): qty 1

## 2015-07-16 MED ORDER — PIPERACILLIN-TAZOBACTAM 3.375 G IVPB 30 MIN
3.3750 g | Freq: Once | INTRAVENOUS | Status: AC
  Administered 2015-07-16: 3.375 g via INTRAVENOUS
  Filled 2015-07-16: qty 50

## 2015-07-16 MED ORDER — ALPRAZOLAM 1 MG PO TABS
1.0000 mg | ORAL_TABLET | Freq: Three times a day (TID) | ORAL | Status: DC | PRN
  Administered 2015-07-16 – 2015-07-20 (×6): 1 mg via ORAL
  Filled 2015-07-16 (×6): qty 1

## 2015-07-16 MED ORDER — LEDIPASVIR-SOFOSBUVIR 90-400 MG PO TABS
1.0000 | ORAL_TABLET | Freq: Every day | ORAL | Status: DC
  Administered 2015-07-17 – 2015-07-21 (×5): 1 via ORAL
  Filled 2015-07-16: qty 1

## 2015-07-16 MED ORDER — FLUOXETINE HCL 20 MG PO CAPS
20.0000 mg | ORAL_CAPSULE | Freq: Every day | ORAL | Status: DC
  Administered 2015-07-17 – 2015-07-21 (×5): 20 mg via ORAL
  Filled 2015-07-16 (×5): qty 1

## 2015-07-16 MED ORDER — SODIUM CHLORIDE 0.9 % IV BOLUS (SEPSIS)
1000.0000 mL | Freq: Once | INTRAVENOUS | Status: AC
  Administered 2015-07-16: 1000 mL via INTRAVENOUS

## 2015-07-16 MED ORDER — SODIUM CHLORIDE 0.9 % IV SOLN
INTRAVENOUS | Status: DC
  Administered 2015-07-16 – 2015-07-18 (×5): via INTRAVENOUS

## 2015-07-16 MED ORDER — TIOTROPIUM BROMIDE MONOHYDRATE 18 MCG IN CAPS
18.0000 ug | ORAL_CAPSULE | Freq: Every day | RESPIRATORY_TRACT | Status: DC
  Administered 2015-07-17 – 2015-07-21 (×4): 18 ug via RESPIRATORY_TRACT
  Filled 2015-07-16: qty 5

## 2015-07-16 MED ORDER — ENSURE ENLIVE PO LIQD
237.0000 mL | Freq: Two times a day (BID) | ORAL | Status: DC
  Administered 2015-07-17 – 2015-07-20 (×5): 237 mL via ORAL

## 2015-07-16 MED ORDER — INSULIN ASPART 100 UNIT/ML ~~LOC~~ SOLN: 0.0000 [IU] | Freq: Every day | SUBCUTANEOUS | Status: DC

## 2015-07-16 MED ORDER — VANCOMYCIN HCL IN DEXTROSE 1-5 GM/200ML-% IV SOLN
1000.0000 mg | Freq: Three times a day (TID) | INTRAVENOUS | Status: DC
  Administered 2015-07-16 – 2015-07-21 (×14): 1000 mg via INTRAVENOUS
  Filled 2015-07-16 (×14): qty 200

## 2015-07-16 MED ORDER — THIAMINE HCL 100 MG/ML IJ SOLN
Freq: Once | INTRAVENOUS | Status: DC
  Filled 2015-07-16: qty 1000

## 2015-07-16 MED ORDER — IPRATROPIUM-ALBUTEROL 0.5-2.5 (3) MG/3ML IN SOLN
3.0000 mL | Freq: Four times a day (QID) | RESPIRATORY_TRACT | Status: DC
  Administered 2015-07-16 – 2015-07-17 (×5): 3 mL via RESPIRATORY_TRACT
  Filled 2015-07-16 (×5): qty 3

## 2015-07-16 MED ORDER — PIPERACILLIN-TAZOBACTAM 3.375 G IVPB
3.3750 g | Freq: Three times a day (TID) | INTRAVENOUS | Status: DC
  Administered 2015-07-16 – 2015-07-21 (×14): 3.375 g via INTRAVENOUS
  Filled 2015-07-16 (×14): qty 50

## 2015-07-16 MED ORDER — OXYCODONE HCL ER 20 MG PO T12A
20.0000 mg | EXTENDED_RELEASE_TABLET | Freq: Three times a day (TID) | ORAL | Status: DC
  Administered 2015-07-16 – 2015-07-21 (×14): 20 mg via ORAL
  Filled 2015-07-16 (×14): qty 1

## 2015-07-16 MED ORDER — OXYCODONE HCL ER 20 MG PO T12A
20.0000 mg | EXTENDED_RELEASE_TABLET | Freq: Two times a day (BID) | ORAL | Status: DC
  Administered 2015-07-16: 20 mg via ORAL
  Filled 2015-07-16: qty 2

## 2015-07-16 MED ORDER — ALBUTEROL SULFATE (2.5 MG/3ML) 0.083% IN NEBU: 2.5000 mg | INHALATION_SOLUTION | RESPIRATORY_TRACT | Status: DC | PRN

## 2015-07-16 MED ORDER — VANCOMYCIN HCL IN DEXTROSE 1-5 GM/200ML-% IV SOLN
1000.0000 mg | Freq: Once | INTRAVENOUS | Status: AC
Start: 2015-07-16 — End: 2015-07-16
  Administered 2015-07-16: 1000 mg via INTRAVENOUS
  Filled 2015-07-16: qty 200

## 2015-07-16 MED ORDER — OXYCODONE-ACETAMINOPHEN 5-325 MG PO TABS
1.0000 | ORAL_TABLET | Freq: Four times a day (QID) | ORAL | Status: DC | PRN
  Administered 2015-07-17: 1 via ORAL
  Administered 2015-07-18 – 2015-07-20 (×7): 2 via ORAL
  Filled 2015-07-16 (×8): qty 2

## 2015-07-16 MED ORDER — INSULIN ASPART 100 UNIT/ML ~~LOC~~ SOLN
0.0000 [IU] | Freq: Three times a day (TID) | SUBCUTANEOUS | Status: DC
  Administered 2015-07-17: 1 [IU] via SUBCUTANEOUS
  Administered 2015-07-17: 2 [IU] via SUBCUTANEOUS
  Administered 2015-07-17 – 2015-07-19 (×5): 1 [IU] via SUBCUTANEOUS
  Administered 2015-07-20: 2 [IU] via SUBCUTANEOUS

## 2015-07-16 MED ORDER — DOCUSATE SODIUM 100 MG PO CAPS
100.0000 mg | ORAL_CAPSULE | Freq: Two times a day (BID) | ORAL | Status: DC
  Administered 2015-07-16 – 2015-07-21 (×10): 100 mg via ORAL
  Filled 2015-07-16 (×10): qty 1

## 2015-07-16 MED ORDER — ENOXAPARIN SODIUM 40 MG/0.4ML ~~LOC~~ SOLN
40.0000 mg | SUBCUTANEOUS | Status: DC
  Administered 2015-07-16 – 2015-07-20 (×5): 40 mg via SUBCUTANEOUS
  Filled 2015-07-16 (×5): qty 0.4

## 2015-07-16 MED ORDER — LIDOCAINE VISCOUS 2 % MT SOLN: 15.0000 mL | OROMUCOSAL | Status: DC | PRN

## 2015-07-16 NOTE — H&P (Deleted)
[facility] History and Physical  Brent Franco [id number] DOB: 1950-11-08 DOA: 07/16/2015  Referring physician: Dr Oleta Mouse PCP: [name], NP   Chief Complaint: SOB  HPI: Brent Franco is a 65 y.o. 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  Home SaO2 monitor showed mid 80's per wife. +subjective fevers/ chills at home, no prod cough only phlegm. Occ chest pain and flank pain.  In ED CXR shows LLL hazy infiltrate, prob PNA.  92% saO2 here on room air.  Asked to see for PNA admission.   Patient was admitted here 1 wk ago for PNA and got about 3 days of IV abx as inpatient then 4 days of po levaquin at home and finished 3-4 days ago.  Has "not gotten better" since having this PNA and remains sluggish, very tired , very sleepy.    Diagnosed per pt's wife in Jan this year with esoph cancer w dysphagia initial symptom. He has a distal lesion per wife , 1 lymph node + and one liver lesion theyr'e not sure about. Took 28 radiation and 5 chemo Rx's per wife.  Plan is now to wait for radiation effects to cool down then do PET scan in early July.  After chemoRx he started on hep C medication (Harvoni), has had hep C for 40 yrs per family but never needed Rx.  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  Past heavy smoker, quit 1 yr ago.  Has DM takes SSI and metformin only at home.  Eating soft foods only now.    ROS  denies CP  no joint pain   no HA  no blurry vision  no rash  off and on diarrhea  no nausea/ vomiting  no dysuria  no difficulty voiding  no change in urine color    Where does patient live home Can patient participate in ADLs? yes  Past Medical History  Past Medical History  Diagnosis Date  . COPD (chronic obstructive pulmonary disease) ([place])   . ETOH abuse     stopped 10 years ago  . Depression     suicide attempt 20 years  ago  . Arthritis   . Asthma   . Viral hepatitis C   . Diabetes mellitus without complication ([place])   . Hypothyroidism   . Cancer ([place])     esophageal  . Pneumonia 02/2015  . Itching     both feet, known has athlet's feet now  . Esophageal cancer ([place]) 04/03/2015  . Pulmonary nodules 07/09/2015   Past Surgical History  Past Surgical History  Procedure Laterality Date  . Other surgical history Left 1995    fatty tissue tumor right upper thigh  . Dental surgery N/A January 2016    "dentures and spurs"   . Tumor excision Left 1990    inner thigh  . Colonoscopy with propofol N/A 03/26/2015    Procedure: COLONOSCOPY WITH PROPOFOL;  Surgeon: Daneil Dolin, MD;  [facility];  Service: Endoscopy;  Laterality: N/A;  1100  . Esophagogastroduodenoscopy (egd) with propofol N/A 03/26/2015    Procedure: ESOPHAGOGASTRODUODENOSCOPY (EGD) WITH PROPOFOL;  Surgeon: Daneil Dolin, MD;  [facility];  Service: Endoscopy;  Laterality: N/A;  . Biopsy  03/26/2015    Procedure: BIOPSY;  Surgeon: Daneil Dolin, MD;  [facility];  Service:  Endoscopy;;  esophageal mass  . Polypectomy  03/26/2015    Procedure: POLYPECTOMY;  Surgeon: Daneil Dolin, MD;  [facility];  Service: Endoscopy;;  polypectomy at ileocecal valve   Family History  Family History  Problem Relation Age of Onset  . Pancreatic cancer Mother   . Lung cancer Father   . Colon cancer Neg Hx    Social History  reports that he quit smoking about 19 months ago. His smoking use included Cigarettes. He started smoking about 48 years ago. He has a 70.5 pack-year smoking history. He has never used smokeless tobacco. He reports that he does not drink alcohol or use illicit drugs. Allergies  Allergies  Allergen Reactions  . Demerol [Meperidine] Rash   Home medications Prior to Admission medications   Medication Sig Start Date End Date Taking? Authorizing Provider  ACCU-CHEK AVIVA PLUS test strip 2  (two) times daily. use for testing 05/02/15  Yes Historical Provider, MD  acetaminophen (TYLENOL) 500 MG tablet Take 500 mg by mouth every 6 (six) hours as needed for fever.   Yes Historical Provider, MD  albuterol (PROVENTIL HFA;VENTOLIN HFA) 108 (90 BASE) MCG/ACT inhaler Inhale 1-2 puffs into the lungs every 6 (six) hours as needed for wheezing or shortness of breath.    Yes Historical Provider, MD  ALPRAZolam Duanne Moron) 1 MG tablet Take 1 tablet (1 mg total) by mouth every 8 (eight) hours as needed for anxiety. 05/21/15  Yes Patrici Ranks, MD  docusate sodium (COLACE) 100 MG capsule Take 100 mg by mouth 2 (two) times daily. 03/10/15  Yes Historical Provider, MD  FLUoxetine (PROZAC) 20 MG capsule Take 1 capsule (20 mg total) by mouth daily. 06/11/15  Yes Thomas S Kefalas, PA-C  HUMULIN R 100 UNIT/ML injection Inject 2-12 Units into the skin 2 (two) times daily as needed for high blood sugar. 151-200 = 2 units, 201-250 = 4 units, 251-300 = 6 units, 301-350 = 8 units, 351-400 = 10 units, 401-450 = 12 units. 03/13/15  Yes Historical Provider, MD  ipratropium-albuterol (DUONEB) 0.5-2.5 (3) MG/3ML SOLN INHALE CONTENTS OF 1 VIAL IN NEBULIZER EVERY 4 HOURS AS NEEDED FOR SHORTNESS OF BREATH. 03/10/15  Yes Historical Provider, MD  Ledipasvir-Sofosbuvir (HARVONI) 90-400 MG TABS Take 1 tablet by mouth daily.    Yes Historical Provider, MD  levothyroxine (SYNTHROID, LEVOTHROID) 25 MCG tablet Take 25 mcg by mouth daily. 06/27/15  Yes Historical Provider, MD  lidocaine (XYLOCAINE) 2 % solution MIX 50/50 WITH WATER AND SWALLOW 10MLS 30 MINUTES BEFORE MEALS AND 30 MINUTES BEFORE BEDTIME 05/22/15  Yes Historical Provider, MD  lidocaine-prilocaine (EMLA) cream Apply a quarter size amount to port site 1 hour prior to chemo. Do not rub in. Cover with plastic wrap. 03/30/15  Yes Patrici Ranks, MD  meloxicam (MOBIC) 7.5 MG tablet TAKE ONE TABLET BY MOUTH ONCE DAILY 05/01/15  Yes Historical Provider, MD  metFORMIN (GLUCOPHAGE)  500 MG tablet Take 500 mg by mouth 2 (two) times daily. 03/10/15  Yes Historical Provider, MD  nitroGLYCERIN (NITROSTAT) 0.4 MG SL tablet Place 1 tablet (0.4 mg total) under the tongue every 5 (five) minutes as needed for chest pain. 09/21/14  Yes Herminio Commons, MD  ondansetron (ZOFRAN) 8 MG tablet TAKE 1 TABLET (8 MG TOTAL) BY MOUTH EVERY 8 (EIGHT) HOURS AS NEEDED FOR NAUSEA OR VOMITING. 07/02/15  Yes Patrici Ranks, MD  oxyCODONE (OXYCONTIN) 20 mg 12 hr tablet Take 1 tablet (20 mg total) by mouth every 8 (  eight) hours. 06/11/15  Yes Baird Cancer, PA-C  oxyCODONE-acetaminophen (ROXICET) 5-325 MG tablet Take 1-2 tablets by mouth every 6 (six) hours as needed for moderate pain or severe pain. 06/11/15  Yes Manon Hilding Kefalas, PA-C  prochlorperazine (COMPAZINE) 10 MG tablet TAKE 1 TABLET (10 MG TOTAL) BY MOUTH EVERY 6 (SIX) HOURS AS NEEDED FOR NAUSEA OR VOMITING. 07/02/15  Yes Patrici Ranks, MD  SPIRIVA HANDIHALER 18 MCG inhalation capsule Place 1 puff into inhaler and inhale daily. 12/04/14  Yes Historical Provider, MD  sucralfate (CARAFATE) 1 g tablet Take 1 tablet by mouth 2 (two) times daily as needed (throat pain).  05/10/15  Yes Historical Provider, MD  tetrahydrozoline 0.05 % ophthalmic solution Place 1 drop into both eyes daily as needed (dry eyes).   Yes Historical Provider, MD  levofloxacin (LEVAQUIN) 750 MG tablet Take 1 tablet (750 mg total) by mouth daily. Antibiotic to be taken for 5 more days. Patient not taking: Reported on 07/16/2015 07/09/15   Rexene Alberts, MD   Liver Function Tests  Recent Labs Lab 07/16/15 1237  AST 26  ALT 17  ALKPHOS 94  BILITOT 0.6  PROT 7.9  ALBUMIN 3.1*   No results for input(s): LIPASE, AMYLASE in the last 168 hours. CBC  Recent Labs Lab 07/16/15 1237  WBC 7.5  NEUTROABS 4.5  HGB 12.6*  HCT 37.5*  MCV 92.4  PLT 99991111   Basic Metabolic Panel  Recent Labs Lab 07/16/15 1237  NA 138  K 4.2  CL 100*  CO2 29  GLUCOSE 98  BUN 10   CREATININE 0.73  CALCIUM 9.0     Filed Vitals:   07/16/15 1430 07/16/15 1500 07/16/15 1552 07/16/15 1600  BP: 137/87 159/72 136/93 118/83  Pulse: 91 91 97 95  Temp:      TempSrc:      Resp: 16 13 13 18   Height:      Weight:      SpO2: 94% 90% 88% 93%   Exam: VSS 92% on RA Gen looks tired, stiff, slow to speak , not in any distress No rash, cyanosis or gangrene Sclera anicteric, throat clear no thrush  No jvd or bruits flat neck veins Chest clear bilat no rales/ rhonchi or wheezing RRR no MRG Abd soft ntnd no mass or ascites +bs GU normal male MS no joint effusions or deformity Ext no LE edema / no wounds or ulcers Neuro is alert, Ox 3 , nf, gen weak mild   EKG (independently reviewed) > nsr no acute changes CXR (independently reviewed) > hazy LLL infiltrate, o/w no acute   Home medications: albuterol, colace, prozac, humalog SSI, duoneb, Harvoni, synthroid, Mobic, metformin, SL NTG, oxycontin, roxicet, compazine, spiriva, carafate, levaquin  7.32/49/42 venous gas Na 138  K 4.2  Creat 0.73  Alb 3.1  LFT's ok WBC 7k  Hb 12.6 UA negative   Assessment: 1. HCAP - recently treated for same. Admit IV abx, IVF"s, nebs and O2 support.  2. Hypoxemia - prob underlying COPD , past heavy smoker quit 1 yr ago.  CXR not impressive but is abnormal in LLL  3. Esoph cancer - sp chemoradiation course, awaiting f/u PET in July, f/b Dr Whitney Muse. Soft diet.  4. DM on oral meds and SSI 5. Prior hx etoh abuse 6. Hepatitic C - on Harvoni 7. Depression  Plan - as above   DVT Prophylaxis lovenox  Code Status: full  Family Communication: wife here  Disposition Plan: nto sure yet  Sol Blazing [facility] Pager [phone]  Cell [phone]  If 7PM-7AM, please contact night-coverage [email or web address] Password [email or web address] 07/16/2015, 4:14 PM

## 2015-07-16 NOTE — ED Provider Notes (Signed)
CSN: [id number]     Arrival date & time 07/16/15  1208 History   First MD Initiated Contact with Patient 07/16/15 1251     Chief Complaint  Patient presents with  . Shortness of Breath     (Consider location/radiation/quality/duration/timing/severity/associated sxs/prior Treatment) HPI 65 year old male who presents with cough and shortness of breath. He has a history of COPD, hepatitis C on hard bony, diabetes, and esophageal cancer status post chemoradiation therapy and followed by Dr. Whitney Muse. I was recently discharged from the hospital one week ago for treatment of hospital-acquired pneumonia. His wife states that he was doing well and finished his Levaquin on Friday. Since finishing his antibiotics, over the past 2 days, he has had progressively worsening shortness of breath with hypoxia on room air to 82%. Symptoms intermittently improved with breathing treatments. Patient has been seeming more tired and lethargic according to his wife. Touch base with Dr. Donald Pore office this morning and they had recommended him coming to the ED for evaluation. He denies any lower extremity edema, chest pain, nausea or vomiting, fevers or chills, abdominal pain, dysuria or urinary frequency. Past Medical History  Diagnosis Date  . COPD (chronic obstructive pulmonary disease) ([place])   . ETOH abuse     stopped 10 years ago  . Depression     suicide attempt 20 years ago  . Arthritis   . Asthma   . Viral hepatitis C   . Diabetes mellitus without complication ([place])   . Hypothyroidism   . Cancer ([place])     esophageal  . Pneumonia 02/2015  . Itching     both feet, known has athlet's feet now  . Esophageal cancer ([place]) 04/03/2015  . Pulmonary nodules 07/09/2015   Past Surgical History  Procedure Laterality Date  . Other surgical history Left 1995    fatty tissue tumor right upper thigh  . Dental surgery N/A January 2016    "dentures and spurs"   . Tumor excision Left 1990    inner thigh  .  Colonoscopy with propofol N/A 03/26/2015    Procedure: COLONOSCOPY WITH PROPOFOL;  Surgeon: Daneil Dolin, MD;  [facility];  Service: Endoscopy;  Laterality: N/A;  1100  . Esophagogastroduodenoscopy (egd) with propofol N/A 03/26/2015    Procedure: ESOPHAGOGASTRODUODENOSCOPY (EGD) WITH PROPOFOL;  Surgeon: Daneil Dolin, MD;  [facility];  Service: Endoscopy;  Laterality: N/A;  . Biopsy  03/26/2015    Procedure: BIOPSY;  Surgeon: Daneil Dolin, MD;  [facility];  Service: Endoscopy;;  esophageal mass  . Polypectomy  03/26/2015    Procedure: POLYPECTOMY;  Surgeon: Daneil Dolin, MD;  [facility];  Service: Endoscopy;;  polypectomy at ileocecal valve   Family History  Problem Relation Age of Onset  . Pancreatic cancer Mother   . Lung cancer Father   . Colon cancer Neg Hx    Social History  Substance Use Topics  . Smoking status: Former Smoker -- 1.50 packs/day for 47 years    Types: Cigarettes    Start date: 12/03/1966    Quit date: 12/08/2013  . Smokeless tobacco: Never Used  . Alcohol Use: No     Comment: Quit 2006: previously drank heavily. Had 1 drink 2-3 years ago (2013-2014)    Review of Systems 10/14 systems reviewed and are negative other than those stated in the HPI    Allergies  Demerol  Home Medications   Prior to Admission medications   Medication Sig  Start Date End Date Taking? Authorizing Provider  ACCU-CHEK AVIVA PLUS test strip 2 (two) times daily. use for testing 05/02/15  Yes Historical Provider, MD  acetaminophen (TYLENOL) 500 MG tablet Take 500 mg by mouth every 6 (six) hours as needed for fever.   Yes Historical Provider, MD  albuterol (PROVENTIL HFA;VENTOLIN HFA) 108 (90 BASE) MCG/ACT inhaler Inhale 1-2 puffs into the lungs every 6 (six) hours as needed for wheezing or shortness of breath.    Yes Historical Provider, MD  ALPRAZolam Duanne Moron) 1 MG tablet Take 1 tablet (1 mg total) by mouth every 8 (eight) hours as  needed for anxiety. 05/21/15  Yes Patrici Ranks, MD  docusate sodium (COLACE) 100 MG capsule Take 100 mg by mouth 2 (two) times daily. 03/10/15  Yes Historical Provider, MD  FLUoxetine (PROZAC) 20 MG capsule Take 1 capsule (20 mg total) by mouth daily. 06/11/15  Yes Thomas S Kefalas, PA-C  HUMULIN R 100 UNIT/ML injection Inject 2-12 Units into the skin 2 (two) times daily as needed for high blood sugar. 151-200 = 2 units, 201-250 = 4 units, 251-300 = 6 units, 301-350 = 8 units, 351-400 = 10 units, 401-450 = 12 units. 03/13/15  Yes Historical Provider, MD  ipratropium-albuterol (DUONEB) 0.5-2.5 (3) MG/3ML SOLN INHALE CONTENTS OF 1 VIAL IN NEBULIZER EVERY 4 HOURS AS NEEDED FOR SHORTNESS OF BREATH. 03/10/15  Yes Historical Provider, MD  Ledipasvir-Sofosbuvir (HARVONI) 90-400 MG TABS Take 1 tablet by mouth daily.    Yes Historical Provider, MD  levothyroxine (SYNTHROID, LEVOTHROID) 25 MCG tablet Take 25 mcg by mouth daily. 06/27/15  Yes Historical Provider, MD  lidocaine (XYLOCAINE) 2 % solution MIX 50/50 WITH WATER AND SWALLOW 10MLS 30 MINUTES BEFORE MEALS AND 30 MINUTES BEFORE BEDTIME 05/22/15  Yes Historical Provider, MD  lidocaine-prilocaine (EMLA) cream Apply a quarter size amount to port site 1 hour prior to chemo. Do not rub in. Cover with plastic wrap. 03/30/15  Yes Patrici Ranks, MD  meloxicam (MOBIC) 7.5 MG tablet TAKE ONE TABLET BY MOUTH ONCE DAILY 05/01/15  Yes Historical Provider, MD  metFORMIN (GLUCOPHAGE) 500 MG tablet Take 500 mg by mouth 2 (two) times daily. 03/10/15  Yes Historical Provider, MD  nitroGLYCERIN (NITROSTAT) 0.4 MG SL tablet Place 1 tablet (0.4 mg total) under the tongue every 5 (five) minutes as needed for chest pain. 09/21/14  Yes Herminio Commons, MD  ondansetron (ZOFRAN) 8 MG tablet TAKE 1 TABLET (8 MG TOTAL) BY MOUTH EVERY 8 (EIGHT) HOURS AS NEEDED FOR NAUSEA OR VOMITING. 07/02/15  Yes Patrici Ranks, MD  oxyCODONE (OXYCONTIN) 20 mg 12 hr tablet Take 1 tablet (20 mg  total) by mouth every 8 (eight) hours. 06/11/15  Yes Baird Cancer, PA-C  oxyCODONE-acetaminophen (ROXICET) 5-325 MG tablet Take 1-2 tablets by mouth every 6 (six) hours as needed for moderate pain or severe pain. 06/11/15  Yes Manon Hilding Kefalas, PA-C  prochlorperazine (COMPAZINE) 10 MG tablet TAKE 1 TABLET (10 MG TOTAL) BY MOUTH EVERY 6 (SIX) HOURS AS NEEDED FOR NAUSEA OR VOMITING. 07/02/15  Yes Patrici Ranks, MD  SPIRIVA HANDIHALER 18 MCG inhalation capsule Place 1 puff into inhaler and inhale daily. 12/04/14  Yes Historical Provider, MD  sucralfate (CARAFATE) 1 g tablet Take 1 tablet by mouth 2 (two) times daily as needed (throat pain).  05/10/15  Yes Historical Provider, MD  tetrahydrozoline 0.05 % ophthalmic solution Place 1 drop into both eyes daily as needed (dry eyes).   Yes Historical Provider,  MD  levofloxacin (LEVAQUIN) 750 MG tablet Take 1 tablet (750 mg total) by mouth daily. Antibiotic to be taken for 5 more days. Patient not taking: Reported on 07/16/2015 07/09/15   Rexene Alberts, MD   BP 137/87 mmHg  Pulse 91  Temp(Src) 97.6 F (36.4 C) (Oral)  Resp 16  Ht 6\' 1"  (1.854 m)  Wt 168 lb (76.204 kg)  BMI 22.17 kg/m2  SpO2 94% Physical Exam Physical Exam  Nursing note and vitals reviewed. Constitutional: Chronically ill appearing, non-toxic, and in no acute distress Head: Normocephalic and atraumatic.  Mouth/Throat: Oropharynx is clear and dry.  Neck: Normal range of motion. Neck supple.  Cardiovascular: Normal rate and regular rhythm.  No edema Pulmonary/Chest: Effort normal. No wheezes. Crackles at the left lung base.  Abdominal: Soft. Mild distension. There is no tenderness. There is no rebound and no guarding.  Musculoskeletal: Normal range of motion.  Neurological: Alert, no facial droop, fluent speech, moves all extremities symmetrically Skin: Skin is warm and dry.  Psychiatric: Cooperative  ED Course  Procedures (including critical care time) Labs Review Labs Reviewed   CBC WITH DIFFERENTIAL/PLATELET - Abnormal; Notable for the following:    RBC 4.06 (*)    Hemoglobin 12.6 (*)    HCT 37.5 (*)    Monocytes Absolute 1.3 (*)    All other components within normal limits  COMPREHENSIVE METABOLIC PANEL - Abnormal; Notable for the following:    Chloride 100 (*)    Albumin 3.1 (*)    All other components within normal limits  BLOOD GAS, VENOUS - Abnormal; Notable for the following:    pH, Ven 7.323 (*)    All other components within normal limits  URINALYSIS, ROUTINE W REFLEX MICROSCOPIC (NOT AT [facility])  Randolm Idol, ED    Imaging Review Dg Chest 2 View  07/16/2015  CLINICAL DATA:  Shortness breath with cough and congestion since yesterday. History of COPD and esophageal cancer. EXAM: CHEST  2 VIEW COMPARISON:  07/08/2013, 07/08/2015 and 07/07/2015 FINDINGS: The right jugular Port-A-Cath is stable and positioned at the superior cavoatrial junction. There are increased densities at the left lung base concerning for airspace disease. Again noted are densities along the medial right lung base but there are increased densities along the right costophrenic angle. Few patchy densities along the periphery of the right upper lung may also be new or enlarged. Heart size is stable. The trachea is midline. No large pleural effusions. No suspicious bone findings. IMPRESSION: Increased densities at both lung bases, left side greater than right. Findings are concerning for pneumonia. Slightly increased disease in the right upper lung. Electronically Signed   By: Markus Daft M.D.   On: 07/16/2015 12:56   I have personally reviewed and evaluated these images and lab results as part of my medical decision-making.   EKG Interpretation   Date/Time:  Monday Jul 16 2015 13:10:33 EDT Ventricular Rate:  88 PR Interval:  123 QRS Duration: 85 QT Interval:  389 QTC Calculation: 471 R Axis:   72 Text Interpretation:  Sinus rhythm No acute ischemic changes. No changes  since last  EKG  Confirmed by Tywaun Hiltner MD, Vendetta Pittinger [phone]) on 07/16/2015 3:44:58 PM      MDM   Final diagnoses:  Hypoxia  HCAP (healthcare-associated pneumonia)  Lobar pneumonia ([place])    65 year old male who presents with shortness of breath. On presentation appears fatigued, but nontoxic in no acute distress. He appears to be breathing comfortably on room air with normal oxygenation. He  is afebrile and hemodynamically stable. No wheezing noted on exam but he does have crackles at the left lung base with chest x-ray suggestive of progressive left lower lobe infiltrate consistent with that of pneumonia. No leukocytosis, or other major electrolyte or metabolic derangements. Although comfortable at rest, he quickly desats to 82% while walking to a bedside commode. Initiated on vancomycin and Zosyn, and admitted to hospitalist service for treatment of hospital-acquired pneumonia.     Forde Dandy, MD 07/16/15 [phone]

## 2015-07-16 NOTE — Progress Notes (Signed)
Pharmacy Antibiotic Note  Brent Franco is a 65 y.o. male admitted on 07/16/2015 with pneumonia (HCAP).  Pharmacy has been consulted for Vancomycin and Zosyn  dosing.  Plan: Initial doses given in ED. Vancomycin 1gm IV every 8 hours.  Goal trough 15-20 mcg/mL. Zosyn 3.375g IV q8h (4 hour infusion). Monitor labs, micro and vitals.   Height: 6\' 1"  (185.4 cm) Weight: 168 lb (76.204 kg) IBW/kg (Calculated) : 79.9  Temp (24hrs), Avg:97.6 F (36.4 C), Min:97.6 F (36.4 C), Max:97.6 F (36.4 C)   Recent Labs Lab 07/16/15 1237  WBC 7.5  CREATININE 0.73    Estimated Creatinine Clearance: 101.9 mL/min (by C-G formula based on Cr of 0.73).    Allergies  Allergen Reactions  . Demerol [Meperidine] Rash    Antimicrobials this admission: Vanc 5/8 >>  Zosyn 5/8 >>   Dose adjustments this admission: n/a  Microbiology results: 5/8 BCx: pending 5/8 Sputum: pending    Thank you for allowing pharmacy to be a part of this patient's care.  Pricilla Larsson 07/16/2015 5:44 PM

## 2015-07-16 NOTE — H&P (Signed)
[facility] History and Physical  Brent Franco [id number] DOB: [birth year] DOA: 07/16/2015  Referring physician: Dr Oleta Mouse PCP: [name], NP   Chief Complaint: SOB  HPI: Brent Franco is a 65 y.o. 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  Home SaO2 monitor showed mid 80's per wife. +subjective fevers/ chills at home, no prod cough only phlegm. Occ chest pain and flank pain.  In ED CXR shows LLL hazy infiltrate, prob PNA.  92% saO2 here on room air.  Asked to see for PNA admission.   Patient was admitted here 1 wk ago for PNA and got about 3 days of IV abx as inpatient then 4 days of po levaquin at home and finished 3-4 days ago.  Has "not gotten better" since having this PNA and remains sluggish, very tired , very sleepy.    Diagnosed per pt's wife in Jan this year with esoph cancer w dysphagia initial symptom. He has a distal lesion per wife , 1 lymph node + and one liver lesion theyr'e not sure about. Took 28 radiation and 5 chemo Rx's per wife.  Plan is now to wait for radiation effects to cool down then do PET scan in early July.  After chemoRx he started on hep C medication (Harvoni), has had hep C for 40 yrs per family but never needed Rx.  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  Past heavy smoker, quit 1 yr ago.  Has DM takes SSI and metformin only at home.  Eating soft foods only now.    ROS  denies CP  no joint pain   no HA  no blurry vision  no rash  off and on diarrhea  no nausea/ vomiting  no dysuria  no difficulty voiding  no change in urine color    Where does patient live home Can patient participate in ADLs? yes  Past Medical History  Past Medical History  Diagnosis Date  . COPD (chronic obstructive pulmonary disease) ([place])   . ETOH abuse     stopped 10 years ago  . Depression     suicide attempt 20 years  ago  . Arthritis   . Asthma   . Viral hepatitis C   . Diabetes mellitus without complication ([place])   . Hypothyroidism   . Cancer ([place])     esophageal  . Pneumonia 02/2015  . Itching     both feet, known has athlet's feet now  . Esophageal cancer ([place]) 04/03/2015  . Pulmonary nodules 07/09/2015   Past Surgical History  Past Surgical History  Procedure Laterality Date  . Other surgical history Left 1995    fatty tissue tumor right upper thigh  . Dental surgery N/A January 2016    "dentures and spurs"   . Tumor excision Left 1990    inner thigh  . Colonoscopy with propofol N/A 03/26/2015    Procedure: COLONOSCOPY WITH PROPOFOL;  Surgeon: Daneil Dolin, MD;  [facility];  Service: Endoscopy;  Laterality: N/A;  1100  . Esophagogastroduodenoscopy (egd) with propofol N/A 03/26/2015    Procedure: ESOPHAGOGASTRODUODENOSCOPY (EGD) WITH PROPOFOL;  Surgeon: Daneil Dolin, MD;  [facility];  Service: Endoscopy;  Laterality: N/A;  . Biopsy  03/26/2015    Procedure: BIOPSY;  Surgeon: Daneil Dolin, MD;  [facility];  Service:  Endoscopy;;  esophageal mass  . Polypectomy  03/26/2015    Procedure: POLYPECTOMY;  Surgeon: Daneil Dolin, MD;  [facility];  Service: Endoscopy;;  polypectomy at ileocecal valve   Family History  Family History  Problem Relation Age of Onset  . Pancreatic cancer Mother   . Lung cancer Father   . Colon cancer Neg Hx    Social History  reports that he quit smoking about 19 months ago. His smoking use included Cigarettes. He started smoking about 48 years ago. He has a 70.5 pack-year smoking history. He has never used smokeless tobacco. He reports that he does not drink alcohol or use illicit drugs. Allergies  Allergies  Allergen Reactions  . Demerol [Meperidine] Rash   Home medications Prior to Admission medications   Medication Sig Start Date End Date Taking? Authorizing Provider  ACCU-CHEK AVIVA PLUS test strip 2  (two) times daily. use for testing 05/02/15  Yes Historical Provider, MD  acetaminophen (TYLENOL) 500 MG tablet Take 500 mg by mouth every 6 (six) hours as needed for fever.   Yes Historical Provider, MD  albuterol (PROVENTIL HFA;VENTOLIN HFA) 108 (90 BASE) MCG/ACT inhaler Inhale 1-2 puffs into the lungs every 6 (six) hours as needed for wheezing or shortness of breath.    Yes Historical Provider, MD  ALPRAZolam Duanne Moron) 1 MG tablet Take 1 tablet (1 mg total) by mouth every 8 (eight) hours as needed for anxiety. 05/21/15  Yes Patrici Ranks, MD  docusate sodium (COLACE) 100 MG capsule Take 100 mg by mouth 2 (two) times daily. 03/10/15  Yes Historical Provider, MD  FLUoxetine (PROZAC) 20 MG capsule Take 1 capsule (20 mg total) by mouth daily. 06/11/15  Yes Thomas S Kefalas, PA-C  HUMULIN R 100 UNIT/ML injection Inject 2-12 Units into the skin 2 (two) times daily as needed for high blood sugar. 151-200 = 2 units, 201-250 = 4 units, 251-300 = 6 units, 301-350 = 8 units, 351-400 = 10 units, 401-450 = 12 units. 03/13/15  Yes Historical Provider, MD  ipratropium-albuterol (DUONEB) 0.5-2.5 (3) MG/3ML SOLN INHALE CONTENTS OF 1 VIAL IN NEBULIZER EVERY 4 HOURS AS NEEDED FOR SHORTNESS OF BREATH. 03/10/15  Yes Historical Provider, MD  Ledipasvir-Sofosbuvir (HARVONI) 90-400 MG TABS Take 1 tablet by mouth daily.    Yes Historical Provider, MD  levothyroxine (SYNTHROID, LEVOTHROID) 25 MCG tablet Take 25 mcg by mouth daily. 06/27/15  Yes Historical Provider, MD  lidocaine (XYLOCAINE) 2 % solution MIX 50/50 WITH WATER AND SWALLOW 10MLS 30 MINUTES BEFORE MEALS AND 30 MINUTES BEFORE BEDTIME 05/22/15  Yes Historical Provider, MD  lidocaine-prilocaine (EMLA) cream Apply a quarter size amount to port site 1 hour prior to chemo. Do not rub in. Cover with plastic wrap. 03/30/15  Yes Patrici Ranks, MD  meloxicam (MOBIC) 7.5 MG tablet TAKE ONE TABLET BY MOUTH ONCE DAILY 05/01/15  Yes Historical Provider, MD  metFORMIN (GLUCOPHAGE)  500 MG tablet Take 500 mg by mouth 2 (two) times daily. 03/10/15  Yes Historical Provider, MD  nitroGLYCERIN (NITROSTAT) 0.4 MG SL tablet Place 1 tablet (0.4 mg total) under the tongue every 5 (five) minutes as needed for chest pain. 09/21/14  Yes Herminio Commons, MD  ondansetron (ZOFRAN) 8 MG tablet TAKE 1 TABLET (8 MG TOTAL) BY MOUTH EVERY 8 (EIGHT) HOURS AS NEEDED FOR NAUSEA OR VOMITING. 07/02/15  Yes Patrici Ranks, MD  oxyCODONE (OXYCONTIN) 20 mg 12 hr tablet Take 1 tablet (20 mg total) by mouth every 8 (  eight) hours. 06/11/15  Yes Baird Cancer, PA-C  oxyCODONE-acetaminophen (ROXICET) 5-325 MG tablet Take 1-2 tablets by mouth every 6 (six) hours as needed for moderate pain or severe pain. 06/11/15  Yes Manon Hilding Kefalas, PA-C  prochlorperazine (COMPAZINE) 10 MG tablet TAKE 1 TABLET (10 MG TOTAL) BY MOUTH EVERY 6 (SIX) HOURS AS NEEDED FOR NAUSEA OR VOMITING. 07/02/15  Yes Patrici Ranks, MD  SPIRIVA HANDIHALER 18 MCG inhalation capsule Place 1 puff into inhaler and inhale daily. 12/04/14  Yes Historical Provider, MD  sucralfate (CARAFATE) 1 g tablet Take 1 tablet by mouth 2 (two) times daily as needed (throat pain).  05/10/15  Yes Historical Provider, MD  tetrahydrozoline 0.05 % ophthalmic solution Place 1 drop into both eyes daily as needed (dry eyes).   Yes Historical Provider, MD  levofloxacin (LEVAQUIN) 750 MG tablet Take 1 tablet (750 mg total) by mouth daily. Antibiotic to be taken for 5 more days. Patient not taking: Reported on 07/16/2015 07/09/15   Rexene Alberts, MD   Liver Function Tests  Recent Labs Lab 07/16/15 1237  AST 26  ALT 17  ALKPHOS 94  BILITOT 0.6  PROT 7.9  ALBUMIN 3.1*   No results for input(s): LIPASE, AMYLASE in the last 168 hours. CBC  Recent Labs Lab 07/16/15 1237  WBC 7.5  NEUTROABS 4.5  HGB 12.6*  HCT 37.5*  MCV 92.4  PLT 99991111   Basic Metabolic Panel  Recent Labs Lab 07/16/15 1237  NA 138  K 4.2  CL 100*  CO2 29  GLUCOSE 98  BUN 10   CREATININE 0.73  CALCIUM 9.0     Filed Vitals:   07/16/15 1552 07/16/15 1600 07/16/15 1659 07/16/15 1757  BP: 136/93 118/83 130/90 121/100  Pulse: 97 95 98 100  Temp:    97.5 F (36.4 C)  TempSrc:    Oral  Resp: 13 18 17 20   Height:    6\' 1"  (1.854 m)  Weight:      SpO2: 88% 93% 89% 86%   Exam: VSS 92% on RA Gen looks tired, stiff, slow to speak , not in any distress No rash, cyanosis or gangrene Sclera anicteric, throat clear no thrush  No jvd or bruits flat neck veins Chest clear bilat no rales/ rhonchi or wheezing RRR no MRG Abd soft ntnd no mass or ascites +bs GU normal male MS no joint effusions or deformity Ext no LE edema / no wounds or ulcers Neuro is alert, Ox 3 , nf, gen weak mild   EKG (independently reviewed) > nsr no acute changes CXR (independently reviewed) > hazy LLL infiltrate, o/w no acute   Home medications: albuterol, colace, prozac, humalog SSI, duoneb, Harvoni, synthroid, Mobic, metformin, SL NTG, oxycontin, roxicet, compazine, spiriva, carafate, levaquin  7.32/49/42 venous gas Na 138  K 4.2  Creat 0.73  Alb 3.1  LFT's ok WBC 7k  Hb 12.6 UA negative   Assessment: 1. HCAP - recently treated for same. Admit IV abx, IVF"s, nebs and O2 support.  2. Hypoxemia - prob underlying COPD , past heavy smoker quit 1 yr ago.  CXR not impressive but is abnormal in LLL  3. Esoph cancer - sp chemoradiation course, awaiting f/u PET in July, f/b Dr Whitney Muse. Soft diet.  4. DM on oral meds and SSI 5. Prior hx etoh abuse 6. Hepatitic C - on Harvoni 7. Depression  Plan - as above   DVT Prophylaxis lovenox  Code Status: full  Family Communication: wife here  Disposition Plan: nto sure yet    Sol Blazing [facility] Pager [phone]  Cell [phone]  If 7PM-7AM, please contact night-coverage [email or web address] Password [email or web address] 07/16/2015, 6:00 PM

## 2015-07-16 NOTE — Progress Notes (Signed)
Brent Franco arrived to unit rm 305 in stable condition.  Report received from Verdene Lennert. ED, RN.

## 2015-07-16 NOTE — ED Notes (Addendum)
PT c/o SOB on exertion with generalized weakness and dizziness x3 days. PT states he was told to come to ED for evaluation by Dr. Donald Pore staff and is getting treatments for esophageal cancer. PT was hospitalized on 07/07/15 for SIRS. PT states home O2 reading in the 80's this weekend and has been sleeping a lot and hard to concentrate.

## 2015-07-17 DIAGNOSIS — J440 Chronic obstructive pulmonary disease with acute lower respiratory infection: Principal | ICD-10-CM

## 2015-07-17 DIAGNOSIS — C155 Malignant neoplasm of lower third of esophagus: Secondary | ICD-10-CM

## 2015-07-17 LAB — BASIC METABOLIC PANEL
Anion gap: 9 (ref 5–15)
BUN: 6 mg/dL (ref 6–20)
CHLORIDE: 101 mmol/L (ref 101–111)
CO2: 28 mmol/L (ref 22–32)
CREATININE: 0.61 mg/dL (ref 0.61–1.24)
Calcium: 8.7 mg/dL — ABNORMAL LOW (ref 8.9–10.3)
GFR calc Af Amer: >60 mL/min (ref >60)
GFR calc non Af Amer: >60 mL/min (ref >60)
GLUCOSE: 120 mg/dL — AB (ref 65–99)
POTASSIUM: 3.9 mmol/L (ref 3.5–5.1)
Sodium: 138 mmol/L (ref 135–145)

## 2015-07-17 LAB — GLUCOSE, CAPILLARY
GLUCOSE-CAPILLARY: 135 mg/dL — AB (ref 65–99)
GLUCOSE-CAPILLARY: 137 mg/dL — AB (ref 65–99)
Glucose-Capillary: 151 mg/dL — ABNORMAL HIGH (ref 65–99)
Glucose-Capillary: 172 mg/dL — ABNORMAL HIGH (ref 65–99)

## 2015-07-17 LAB — CBC
HEMATOCRIT: 34.4 % — AB (ref 39.0–52.0)
Hemoglobin: 11.4 g/dL — ABNORMAL LOW (ref 13.0–17.0)
MCH: 30.4 pg (ref 26.0–34.0)
MCHC: 33.1 g/dL (ref 30.0–36.0)
MCV: 91.7 fL (ref 78.0–100.0)
PLATELETS: 215 10*3/uL (ref 150–400)
RBC: 3.75 MIL/uL — ABNORMAL LOW (ref 4.22–5.81)
RDW: 14.8 % (ref 11.5–15.5)
WBC: 5.8 10*3/uL (ref 4.0–10.5)

## 2015-07-17 LAB — HIV ANTIBODY (ROUTINE TESTING W REFLEX): HIV Screen 4th Generation wRfx: NONREACTIVE

## 2015-07-17 MED ORDER — IPRATROPIUM-ALBUTEROL 0.5-2.5 (3) MG/3ML IN SOLN
3.0000 mL | Freq: Three times a day (TID) | RESPIRATORY_TRACT | Status: DC
  Administered 2015-07-18 – 2015-07-20 (×5): 3 mL via RESPIRATORY_TRACT
  Filled 2015-07-17 (×7): qty 3

## 2015-07-17 MED ORDER — GUAIFENESIN ER 600 MG PO TB12
1200.0000 mg | ORAL_TABLET | Freq: Two times a day (BID) | ORAL | Status: DC
  Administered 2015-07-17 – 2015-07-21 (×9): 1200 mg via ORAL
  Filled 2015-07-17 (×9): qty 2

## 2015-07-17 NOTE — Care Management Note (Signed)
Case Management Note  Patient Details  Name: Brent Franco MRN: [id number] Date of Birth: 23-Nov-1950  Subjective/Objective:                  Pt is from home, lives with his spouse and is ind with ADL's. Pt has neb machine but no home O2. Pt feels he needs it at home. Pt has oncologist but receives PCP care through the [facility]. Pt is concerned about clinic closing in June. Pt requesting assistance finding new PCP. Pt given list of PCP providers in the community accepting new pt's. Pt has stated he has no preference just needs a PCP and "find who ever you can". [facility] called and is willing and able to accept pt. F/U appointment made and placed in pt's chart. Pt made aware.    Action/Plan: Pt will need home O2 assessment prior to discharge.   Expected Discharge Date:    07/23/2015              Expected Discharge Plan:  Home/Self Care  In-House Referral:  NA  Discharge planning Services  CM Consult, Follow-up appt scheduled  Post Acute Care Choice:  NA Choice offered to:  NA  DME Arranged:    DME Agency:     HH Arranged:    HH Agency:     Status of Service:  In process, will continue to follow  Medicare Important Message Given:    Date Medicare IM Given:    Medicare IM give by:    Date Additional Medicare IM Given:    Additional Medicare Important Message give by:     If discussed at [place] of Stay Meetings, dates discussed:    Additional Comments:  Sherald Barge, RN 07/17/2015, 2:48 PM

## 2015-07-17 NOTE — Progress Notes (Signed)
PROGRESS NOTE    Brent Franco  [id number] DOB: 1951-02-10 DOA: 07/16/2015 PCP: [name], NP Outpatient Specialists:    Brief Narrative:  54 yom with a hx of esophageal CA s/p chemoradiation therapy course this year, presented with SOB, fatigue, and malaise. He was admitted here 1 week ago for PNA and got 3 days of IV abx as an inpatient and then 4 days of PO levaquin that he finished 3-4 days ago. He showed no improvement in symptom. Per wife, home O2 sats were in the 80's and he remained sluggish and tired. While in the ED, CXR showed progressive infiltrates. His sats were 92% on room air. He was referred for PNA admission.    Assessment & Plan:   Principal Problem:   Healthcare-associated pneumonia Active Problems:   COPD (chronic obstructive pulmonary disease) (HCC)   History of hepatitis C   Esophageal cancer ([facility])   SOB (shortness of breath)   Diabetes mellitus without complication, with long-term current use of insulin ([facility])   Lobar pneumonia ([facility])   Hypoxemia   HCAP (healthcare-associated pneumonia)  1. HCAP. Pt was recently treated for HCAP and completed a course of PO Levaquin 3-4 days ago. He has been restarted on IV Vanc and Zosyn. Will continue current tx for now and monitor. Continue pulmonary hygiene. 2. Acute respiratory failure wth hypoxia. Possibly related to #1. Wean off O2 as tolerated.  3. Esophageal cancer. S/p chemoradiation course, awaiting f/u PET in July, f/b Dr Whitney Muse. Soft diet.  4. DM. Metformin currently on hold. Continue SSI. 5. Hx of alcohol abuse. No evidence of alcohol withdrawal at this time. 6. COPD. No evidence of wheezing at this time. Continue bronchodilators. 7. Chronic pain syndrome. Continue oxycontin. 8. Hepatitic C - on Harvoni 9. Depression.    DVT prophylaxis: Lovenox Code Status: Full Family Communication: Discussed with patient and family at bedside. Disposition Plan: discharge home once improved   Consultants:    none  Procedures:   none  Antimicrobials:   Vanc 5/08 >>    Subjective: Pt still gets short of breath when he tries to ambulate. He has been having a cough.   Objective: Filed Vitals:   07/17/15 0113 07/17/15 0454 07/17/15 0810 07/17/15 1415  BP:  94/53  114/76  Pulse:  88  94  Temp:  97.7 F (36.5 C)  98.6 F (37 C)  TempSrc:  Oral  Oral  Resp:  20  18  Height:      Weight:      SpO2: 96% 96% 86% 94%    Intake/Output Summary (Last 24 hours) at 07/17/15 1438 Last data filed at 07/17/15 1300  Gross per 24 hour  Intake 2231.67 ml  Output      0 ml  Net 2231.67 ml   Filed Weights   07/16/15 1213  Weight: 76.204 kg (168 lb)    Examination:  General exam: Appears calm and comfortable. Appears chronically ill, cachetic  Respiratory system: Coarse breath sounds at bases. Respiratory effort normal. Cardiovascular system: S1 & S2 heard, RRR. No JVD, murmurs, rubs, gallops or clicks. No pedal edema. Gastrointestinal system: Abdomen is nondistended, soft and nontender. No organomegaly or masses felt. Normal bowel sounds heard. Central nervous system: Alert and oriented. No focal neurological deficits. Extremities: Symmetric 5 x 5 power. Skin: No rashes, lesions or ulcers Psychiatry: Judgement and insight appear normal. Mood & affect appropriate.     Data Reviewed: I have personally reviewed following labs and imaging studies  CBC:  Recent Labs Lab 07/16/15 1237 07/17/15 0609  WBC 7.5 5.8  NEUTROABS 4.5  --   HGB 12.6* 11.4*  HCT 37.5* 34.4*  MCV 92.4 91.7  PLT 245 123456   Basic Metabolic Panel:  Recent Labs Lab 07/16/15 1237 07/17/15 0609  NA 138 138  K 4.2 3.9  CL 100* 101  CO2 29 28  GLUCOSE 98 120*  BUN 10 6  CREATININE 0.73 0.61  CALCIUM 9.0 8.7*   GFR: Estimated Creatinine Clearance: 101.9 mL/min (by C-G formula based on Cr of 0.61). Liver Function Tests:  Recent Labs Lab 07/16/15 1237  AST 26  ALT 17  ALKPHOS 94  BILITOT 0.6   PROT 7.9  ALBUMIN 3.1*   No results for input(s): LIPASE, AMYLASE in the last 168 hours. No results for input(s): AMMONIA in the last 168 hours. Coagulation Profile: No results for input(s): INR, PROTIME in the last 168 hours. Cardiac Enzymes: No results for input(s): CKTOTAL, CKMB, CKMBINDEX, TROPONINI in the last 168 hours. BNP (last 3 results) No results for input(s): PROBNP in the last 8760 hours. HbA1C: No results for input(s): HGBA1C in the last 72 hours. CBG:  Recent Labs Lab 07/16/15 2047 07/17/15 0710 07/17/15 1145  GLUCAP 131* 135* 137*   Lipid Profile: No results for input(s): CHOL, HDL, LDLCALC, TRIG, CHOLHDL, LDLDIRECT in the last 72 hours. Thyroid Function Tests: No results for input(s): TSH, T4TOTAL, FREET4, T3FREE, THYROIDAB in the last 72 hours. Anemia Panel: No results for input(s): VITAMINB12, FOLATE, FERRITIN, TIBC, IRON, RETICCTPCT in the last 72 hours. Urine analysis:    Component Value Date/Time   COLORURINE YELLOW 07/16/2015 [place] 07/16/2015 1358   LABSPEC 1.015 07/16/2015 1358   PHURINE 5.5 07/16/2015 1358   GLUCOSEU NEGATIVE 07/16/2015 1358   HGBUR NEGATIVE 07/16/2015 [place] 07/16/2015 1358   KETONESUR NEGATIVE 07/16/2015 1358   PROTEINUR NEGATIVE 07/16/2015 1358   UROBILINOGEN 0.2 08/23/2014 1321   NITRITE NEGATIVE 07/16/2015 1358   LEUKOCYTESUR NEGATIVE 07/16/2015 1358   Sepsis Labs: @LABRCNTIP (procalcitonin:4,lacticidven:4)  ) Recent Results (from the past 240 hour(s))  Blood culture (routine x 2)     Status: None   Collection Time: 07/07/15  8:21 PM  Result Value Ref Range Status   Specimen Description BLOOD RIGHT HAND DRAWN BY RN  Final   Special Requests BOTTLES DRAWN AEROBIC AND ANAEROBIC 8CC EACH  Final   Culture NO GROWTH 5 DAYS  Final   Report Status 07/12/2015 FINAL  Final  Blood culture (routine x 2)     Status: None   Collection Time: 07/07/15 11:57 PM  Result Value Ref Range  Status   Specimen Description BLOOD RIGHT ANTECUBITAL  Final   Special Requests BOTTLES DRAWN AEROBIC AND ANAEROBIC 8CC EACH  Final   Culture NO GROWTH 6 DAYS  Final   Report Status 07/14/2015 FINAL  Final  Culture, expectorated sputum-assessment     Status: None   Collection Time: 07/09/15  8:00 AM  Result Value Ref Range Status   Specimen Description SPUTUM EXPECTORATED  Final   Special Requests NONE  Final   Sputum evaluation   Final    MICROSCOPIC FINDINGS SUGGEST THAT THIS SPECIMEN IS NOT REPRESENTATIVE OF LOWER RESPIRATORY SECRETIONS. PLEASE RECOLLECT. Results Called to: EVERETTE,R. RN AT [id number] ON 07/09/2015 BY Elza Rafter. Performed at [facility]    Report Status 07/09/2015 FINAL  Final  Culture, blood (routine x 2) Call MD if unable to obtain prior to antibiotics being given  Status: None (Preliminary result)   Collection Time: 07/16/15  5:58 PM  Result Value Ref Range Status   Specimen Description BLOOD  Final   Special Requests NONE  Final   Culture NO GROWTH < 24 HOURS  Final   Report Status PENDING  Incomplete  Culture, blood (routine x 2) Call MD if unable to obtain prior to antibiotics being given     Status: None (Preliminary result)   Collection Time: 07/16/15  6:02 PM  Result Value Ref Range Status   Specimen Description BLOOD  Final   Special Requests NONE  Final   Culture NO GROWTH < 24 HOURS  Final   Report Status PENDING  Incomplete     Radiology Studies: Dg Chest 2 View  07/16/2015  CLINICAL DATA:  Shortness breath with cough and congestion since yesterday. History of COPD and esophageal cancer. EXAM: CHEST  2 VIEW COMPARISON:  07/08/2013, 07/08/2015 and 07/07/2015 FINDINGS: The right jugular Port-A-Cath is stable and positioned at the superior cavoatrial junction. There are increased densities at the left lung base concerning for airspace disease. Again noted are densities along the medial right lung base but there are increased densities along the  right costophrenic angle. Few patchy densities along the periphery of the right upper lung may also be new or enlarged. Heart size is stable. The trachea is midline. No large pleural effusions. No suspicious bone findings. IMPRESSION: Increased densities at both lung bases, left side greater than right. Findings are concerning for pneumonia. Slightly increased disease in the right upper lung. Electronically Signed   By: Markus Daft M.D.   On: 07/16/2015 12:56     Scheduled Meds: . docusate sodium  100 mg Oral BID  . enoxaparin (LOVENOX) injection  40 mg Subcutaneous Q24H  . feeding supplement (ENSURE ENLIVE)  237 mL Oral BID BM  . FLUoxetine  20 mg Oral Daily  . guaiFENesin  1,200 mg Oral BID  . insulin aspart  0-5 Units Subcutaneous QHS  . insulin aspart  0-9 Units Subcutaneous TID WC  . ipratropium-albuterol  3 mL Nebulization Q6H  . Ledipasvir-Sofosbuvir  1 tablet Oral Daily  . levothyroxine  25 mcg Oral QAC breakfast  . oxyCODONE  20 mg Oral Q8H  . piperacillin-tazobactam (ZOSYN)  IV  3.375 g Intravenous Q8H  . banana bag IV 1000 mL   Intravenous Once  . tiotropium  18 mcg Inhalation Daily  . vancomycin  1,000 mg Intravenous Q8H   Continuous Infusions: . sodium chloride 100 mL/hr at 07/16/15 1745     LOS: 1 day    Time spent: 25 minutes    Kathie Dike MD [facility] Pager [phone]  If 7PM-7AM, please contact night-coverage [email or web address] Password [email or web address] 07/17/2015, 2:38 PM    By signing my name below, I, Delene Ruffini, attest that this documentation has been prepared under the direction and in the presence of Kathie Dike, MD. Electronically Signed: Delene Ruffini 07/17/2015 2:25pm  I, Dr. Kathie Dike, personally performed the services described in this documentaiton. All medical record entries made by the scribe were at my direction and in my presence. I have reviewed the chart and agree that the record reflects my personal performance and is  accurate and complete  Kathie Dike, MD, 07/17/2015 2:39 PM

## 2015-07-18 DIAGNOSIS — J189 Pneumonia, unspecified organism: Secondary | ICD-10-CM

## 2015-07-18 DIAGNOSIS — J9601 Acute respiratory failure with hypoxia: Secondary | ICD-10-CM

## 2015-07-18 LAB — BASIC METABOLIC PANEL
Anion gap: 7 (ref 5–15)
BUN: 5 mg/dL — AB (ref 6–20)
CALCIUM: 8.5 mg/dL — AB (ref 8.9–10.3)
CO2: 29 mmol/L (ref 22–32)
CREATININE: 0.66 mg/dL (ref 0.61–1.24)
Chloride: 102 mmol/L (ref 101–111)
GFR calc non Af Amer: >60 mL/min (ref >60)
Glucose, Bld: 109 mg/dL — ABNORMAL HIGH (ref 65–99)
Potassium: 4.3 mmol/L (ref 3.5–5.1)
SODIUM: 138 mmol/L (ref 135–145)

## 2015-07-18 LAB — GLUCOSE, CAPILLARY
GLUCOSE-CAPILLARY: 134 mg/dL — AB (ref 65–99)
GLUCOSE-CAPILLARY: 136 mg/dL — AB (ref 65–99)
Glucose-Capillary: 107 mg/dL — ABNORMAL HIGH (ref 65–99)
Glucose-Capillary: 127 mg/dL — ABNORMAL HIGH (ref 65–99)

## 2015-07-18 LAB — CBC
HCT: 33.6 % — ABNORMAL LOW (ref 39.0–52.0)
Hemoglobin: 11.2 g/dL — ABNORMAL LOW (ref 13.0–17.0)
MCH: 30.8 pg (ref 26.0–34.0)
MCHC: 33.3 g/dL (ref 30.0–36.0)
MCV: 92.3 fL (ref 78.0–100.0)
PLATELETS: 218 10*3/uL (ref 150–400)
RBC: 3.64 MIL/uL — AB (ref 4.22–5.81)
RDW: 14.6 % (ref 11.5–15.5)
WBC: 5.7 10*3/uL (ref 4.0–10.5)

## 2015-07-18 LAB — VANCOMYCIN, TROUGH: VANCOMYCIN TR: 18 ug/mL (ref 10.0–20.0)

## 2015-07-18 NOTE — Progress Notes (Signed)
Pharmacy Antibiotic Note  Brent Franco is a 65 y.o. male admitted on 07/16/2015 with pneumonia (HCAP).  Pharmacy has been consulted for Vancomycin and Zosyn  Dosing.  Trough level is on target.  Renal fxn is stable.  Blood cx's pending.  Plan: Continue Vancomycin 1gm IV every 8 hours.  Goal trough 15-20 mcg/mL. Continue Zosyn 3.375g IV q8h (4 hour infusion). Monitor labs, micro and vitals.  Height: 6\' 1"  (185.4 cm) Weight: 168 lb (76.204 kg) IBW/kg (Calculated) : 79.9  Temp (24hrs), Avg:98.2 F (36.8 C), Min:97.9 F (36.6 C), Max:98.6 F (37 C)   Recent Labs Lab 07/16/15 1237 07/17/15 0609 07/18/15 0615  WBC 7.5 5.8 5.7  CREATININE 0.73 0.61 0.66  VANCOTROUGH  --   --  18    Estimated Creatinine Clearance: 101.9 mL/min (by C-G formula based on Cr of 0.66).    Allergies  Allergen Reactions  . Demerol [Meperidine] Rash   Antimicrobials this admission: Vanc 5/8 >>  Zosyn 5/8 >>   Dose adjustments this admission: None needed  Microbiology results: 5/8 BCx: pending 5/8 Sputum: pending   Thank you for allowing pharmacy to be a part of this patient's care.  Hart Robinsons A 07/18/2015 10:55 AM

## 2015-07-18 NOTE — Progress Notes (Signed)
Initial Nutrition Assessment  DOCUMENTATION CODES:  Non-severe (moderate) malnutrition in context of acute illness/injury   Pt meets criteria for MODERATE MALNUTRITION in the context of ACUTE ILLNESS as evidenced by an intake of <75% of estimated energy needs for >7 days and mild fat/muscle wasting.  INTERVENTION:  Magic cup TID with meals, each supplement provides 290 kcal and 9 grams of protein  Gave detailed education on weight maintenance, high kacl/pro foods, food safety and TF (if needed in future).   Handout "Soft and Moist  High Protein Menu Ideas"   Recommend SLP swallow eval (potentially op), due to reported dysphagia w/ food "getting stuck" in throat  NUTRITION DIAGNOSIS:  Increased nutrient needs related to acute illness and underlying chronic disease states as evidenced by the combination of estimated nutritional requirements of all conditions  GOAL:  Patient will meet greater than or equal to 90% of their needs  MONITOR:  PO intake, Supplement acceptance, Labs, I & O's  REASON FOR ASSESSMENT:  Malnutrition Screening Tool    ASSESSMENT:  65 y/o male PMHx COPD, ETOH abuse, depression, Hep C, DM and esophageal cancer s/p chemoradiation. Presents with SOB, fatigue, malaise. Worked up for PNA which he was just admitted for ~ 1 week ago.   Pt is a [facility] patient who had been contacted a couple times by this RD via telephone. Following up s/p esophageal cancer treatment and current acute illness  RD Had very extensive, pleasant conversation with family members.  Patient was asleep on arrival, but woke up ~ 15 minutes into conversation. Patient is ~1.5 months out from finishing chemo and radiation. Nutritionally, did remarkably well. He lost only 7 lbs. Wife and daughter state they worked extremely hard to keep his weight stable. Daughter in particular was instrumental in his success and is likely the reason he was able to avoid TF placement. She gave examples of very creative  ways to increase pt's protein + calorie intake with the use of a "Food ninja". Pt also frequently consumed oral nutritional beverages. They say they kept on him and he hated them for it. However, efforts paid off.   Family reports that recurrent PNA has been the the main problem. Pt was noted as saying he felt worse with the PNA than he did during chemo or radiation. He would eat just bites of meals during these episodes. No n/v but patient struggles with both constipation and diarrhea. They noted using laxatives, but this gave patient severe diarrhea. They have not found optimal ratio of pain/bowel medication. Encouraged to continue to talk with oncology MD at appointment.   Complicating  things further is patients recent treatment with HARVONI. Pt refers to it as "that awful pill". This apparently makes him extremely fatigued which he already has at baseline. After waking up, pt stated that he thought he was able to maintain his weight prior to starting this medication and largely believes this is impacting his ability to eat. Unsure how long he will be taking this.   RD asked about how well he could tolerate soft diet. Family states that pt does have some trouble swallowing and sometimes says foods get stuck in his throat. Patient also agrees with some degree of dysphagia to solids. They had not heard anything about potentially receiving ST and after hearing what ST helped with, they thought it could be helpful. MD at [facility] may already have plan for this or felt he wouldn't be appropriate. RD to discuss with MD  RD gave extensive  education on weight maintenance. Went over high kcal/protein foods as well as ways to increase the caloric density of meals and beverages through use of modulars. Suggested ways to enhance low calorie foods, by adding condiments topping etc. Discussed foods to avoid vs promote. Promoted frequent addition of cheese, nuts, PB, ice cream, condiments to all foods. RD also briefly  touched on food safety given immunocompromised status.   While patient was sleeping, daughter/wife revealed Fear of a feeding tube had the effect of motivating patient to eat well. However, recently the daughter and wife thought amongst themselves that it may be important to help to prevent dehydration due to  acute illnesses that come about due to compromised immune function. They asked RD to talk a little with pt about FT and benefits of them. RD had this discussion with pt regarding this when he woke up. He said that, if necessary, he would be open to it in the future if it could help prolong life.   Pt is tired of Ensure and Boost. Was agreeable to magic cup for variety.   Pt is scheduled for PET scan in June and a follow up at [facility] next week.    Pt has lost ~ 24 lbs in the last 6 months. His Ubw used to be 190-195 lb. He lost a large amount prior to even starting his cancer treatment due to impaired PO intake. He has lost ~7 lbs since starting treatment.   Nfpe: Mild muscle wasting. Per family report, pt is thin in general and his weight of 190 from last year was abnormal for him. Do not believe he looks much different except being "slightly more bony in shoulders"  Labs reviewed: CBGS typically 110-150, h/h: 11.2/33.6, albumin 3.1  Recent Labs Lab 07/16/15 1237 07/17/15 0609 07/18/15 0615  NA 138 138 138  K 4.2 3.9 4.3  CL 100* 101 102  CO2 29 28 29   BUN 10 6 5*  CREATININE 0.73 0.61 0.66  CALCIUM 9.0 8.7* 8.5*  GLUCOSE 98 120* 109*    Diet Order:  DIET SOFT Room service appropriate?: Yes; Fluid consistency:: Thin  Skin:  Reviewed, no issues  Last BM:  5/9  Height:  Ht Readings from Last 1 Encounters:  07/16/15 6\' 1"  (1.854 m)   Weight:  Wt Readings from Last 1 Encounters:  07/16/15 168 lb (76.204 kg)   Wt Readings from Last 10 Encounters:  07/16/15 168 lb (76.204 kg)  07/07/15 168 lb (76.204 kg)  07/04/15 169 lb 1.6 oz (76.703 kg)  06/13/15 168 lb 3.2 oz (76.295  kg)  06/11/15 170 lb 3.2 oz (77.202 kg)  06/04/15 168 lb 3.2 oz (76.295 kg)  05/28/15 166 lb (75.297 kg)  05/21/15 168 lb (76.204 kg)  05/14/15 176 lb (79.833 kg)  05/07/15 177 lb 11.2 oz (80.604 kg)  12/20-192 lbs  Ideal Body Weight:  83 kg  BMI:  Body mass index is 22.17 kg/(m^2).  Estimated Nutritional Needs:  Kcal:  2050-2300 (27-30 kcal/kg bw) Protein:  91-107 g (1.2-1.4 g/kg bw) Fluid:  2.3 liters  EDUCATION NEEDS:  Education needs addressed  Burtis Junes RD, LDN Clinical Nutrition Pager: [phone] 07/18/2015 4:42 PM

## 2015-07-18 NOTE — Progress Notes (Signed)
PROGRESS NOTE  Brent Franco [id number] DOB: 01/06/1951 DOA: 07/16/2015 PCP: [name], NP  Brief Narrative: 67 yom with a hx of esophageal CA s/p chemoradiation resented with complaints of SOB, fatigue, and malaise, He was recently treated at [facility] one week prior for pneumonia. He became ill and on presentation wife reported oxygen saturation in the 80s. PWhile in the ED, a CXR showed progressive infiltrates and his sats were 92% on RA. He was referred for PNA.  Assessment/Plan: 1. HCAP, recurrent. Recently treated for HCAP and completed course of PO levaquin 3-4 days ago. CXR showed progressive infiltrates. 2. Acute respiratory failure with hypoxia. Secondary to HCAP.  3. Esophageal CA. S/p chemoradiation course. He is on a soft diet. 4. DM type 2, stable. Metformin currently being held. 5. Hx of alcohol abuse. No evidence of withdrawal. 6. COPD. stable. 7. Chronic pain syndrome.  8. Hepatitis C. Currently on Harvoni 9. Depression.   Appears ill, weak but nontoxic. Continue IV Vanc and Zosyn. Continue pulmonary hygiene.  Continue O2, wean as tolerated.   Recommend follow-up CT of the chest in 3-6 months to evaluate for interval changes or clearing of the pulmonary nodules in the right upper lobe of the lung.  DVT prophylaxis: Lovenox Code Status: Full Family Communication: Discussed with patient and wife at bedside. Disposition Plan: discharge home once improved in 2-3 days  Murray Hodgkins, MD  [facility] Direct contact:  --[place]ia [place]  --[email or web address]; password [email or web address] and click  [phone] contact night coverage as above 07/18/2015, 4:24 PM  LOS: 2 days   Consultants:  none  Procedures:  none  Antimicrobials:  Vanc 5/08 >>  HPI/Subjective: Feels improved today. Breathing is doing well with O2. He reports some productive cough. Does not have a good appetite. He has not had any vomiting.   Objective: Filed Vitals:   07/18/15 0747 07/18/15  0754 07/18/15 1200 07/18/15 1415  BP:   158/95   Pulse:   99   Temp:   97.7 F (36.5 C)   TempSrc:      Resp:      Height:      Weight:      SpO2: 92% 92% 92% 95%    Intake/Output Summary (Last 24 hours) at 07/18/15 1624 Last data filed at 07/18/15 1200  Gross per 24 hour  Intake    720 ml  Output      0 ml  Net    720 ml     Filed Weights   07/16/15 1213  Weight: 76.204 kg (168 lb)    Exam:    Constitutional:  . Appears calm and comfortable .  Respiratory:  . CTA bilaterally, no w/r/r.  . Respiratory effort normal. No retractions or accessory muscle use Cardiovascular:  . RRR, no m/r/g . No LE extremity edema   . Telemetry SR  I have personally reviewed following labs and imaging studies:  BMP unremarkable  CBC unremarkable  Scheduled Meds: . docusate sodium  100 mg Oral BID  . enoxaparin (LOVENOX) injection  40 mg Subcutaneous Q24H  . feeding supplement (ENSURE ENLIVE)  237 mL Oral BID BM  . FLUoxetine  20 mg Oral Daily  . guaiFENesin  1,200 mg Oral BID  . insulin aspart  0-5 Units Subcutaneous QHS  . insulin aspart  0-9 Units Subcutaneous TID WC  . ipratropium-albuterol  3 mL Nebulization TID  . Ledipasvir-Sofosbuvir  1 tablet Oral Daily  . levothyroxine  25 mcg Oral QAC breakfast  .  oxyCODONE  20 mg Oral Q8H  . piperacillin-tazobactam (ZOSYN)  IV  3.375 g Intravenous Q8H  . banana bag IV 1000 mL   Intravenous Once  . tiotropium  18 mcg Inhalation Daily  . vancomycin  1,000 mg Intravenous Q8H   Continuous Infusions: . sodium chloride 100 mL/hr at 07/17/15 1708    Principal Problem:   Healthcare-associated pneumonia Active Problems:   COPD (chronic obstructive pulmonary disease) (HCC)   Esophageal cancer (HCC)   Hepatitis C   Diabetes mellitus without complication, with long-term current use of insulin (HCC)   Lobar pneumonia ([place])   Hypoxemia   LOS: 2 days   Time spent 25 minutes  By signing my name below, I, Delene Ruffini, attest  that this documentation has been prepared under the direction and in the presence of [place]. Sarajane Jews, MD. Electronically Signed: Delene Ruffini, Scribe.  07/18/2015 12:30pm  I personally performed the services described in this documentation. All medical record entries made by the scribe were at my direction. I have reviewed the chart and agree that the record reflects my personal performance and is accurate and complete. Murray Hodgkins, MD

## 2015-07-19 DIAGNOSIS — J449 Chronic obstructive pulmonary disease, unspecified: Secondary | ICD-10-CM

## 2015-07-19 LAB — GLUCOSE, CAPILLARY
GLUCOSE-CAPILLARY: 88 mg/dL (ref 65–99)
GLUCOSE-CAPILLARY: 109 mg/dL — AB (ref 65–99)
Glucose-Capillary: 132 mg/dL — ABNORMAL HIGH (ref 65–99)
Glucose-Capillary: 129 mg/dL — ABNORMAL HIGH (ref 65–99)

## 2015-07-19 NOTE — Care Management Note (Signed)
Case Management Note  Patient Details  Name: Brent Franco MRN: [id number] Date of Birth: [birth year]   Expected Discharge Date:    07/19/2015              Expected Discharge Plan:  Home/Self Care  In-House Referral:  NA  Discharge planning Services  CM Consult, Follow-up appt scheduled  Post Acute Care Choice:  Durable Medical Equipment Choice offered to:  Patient  DME Arranged:  Oxygen DME Agency:  [facility]  HH Arranged:    [place] Agency:     Status of Service:  Completed, signed off  Medicare Important Message Given:    Date Medicare IM Given:    Medicare IM give by:    Date Additional Medicare IM Given:    Additional Medicare Important Message give by:     If discussed at [place] of Stay Meetings, dates discussed:    Additional Comments: Anticipate DC home today. Spouse at bedside. Pt aware of new PCP and f/u appointment. Pt has met qualifications for home O2 and pt has chosen [employer] as DME agency. Pt's information has been faxed to [place], call placed to verify receipt of info. Pt understands port O2 tank must be delivered to room prior to DC. No other CM needs anticipated.  Sherald Barge, RN 07/19/2015, 12:50 PM

## 2015-07-19 NOTE — Progress Notes (Signed)
PROGRESS NOTE  Brent Franco [id number] DOB: 04/04/1950 DOA: 07/16/2015 PCP: [name], NP  Brief Narrative: 39 yom with a hx of esophageal CA s/p chemoradiation resented with complaints of SOB, fatigue, and malaise, He was recently treated at [facility] one week prior for pneumonia. He became ill and on presentation wife reported oxygen saturation in the 80s. PWhile in the ED, a CXR showed progressive infiltrates and his sats were 92% on RA. He was referred for PNA.  Assessment/Plan: 1. HCAP, slowly improving. 2. Acute hypoxic respiratory failure. Secondary to pneumonia, underlying COPD. Slowly improving. Will need home oxygen. 3. Esophageal CA. S/p chemoradiation course. Continue on soft diet. 4. DM type 2, remained stable 5. Hx of alcohol abuse. Remains stable with evidence of withdrawal. 6. COPD. remains stable 7. Chronic pain syndrome.  8. Hepatitis C. Currently on Harvoni 9. Depression.   Continues to slowly improve. Continue abx.   Will need home oxygen  Recommend follow-up CT of the chest in 3-6 months to evaluate for interval changes or clearing of the pulmonary nodules in the right upper lobe of the lung.  DVT prophylaxis: Lovenox Code Status: Full Family Communication: Discussed with patient. daughter  wife at bedside. Disposition Plan: discharge home once improved in 2-3 days  Murray Hodgkins, MD  [facility] Direct contact:  --Via [place]  --[email or web address]; password [email or web address] and click  [phone] contact night coverage as above 07/19/2015, 7:40 AM  LOS: 3 days   Consultants:  none  Procedures:  none  Antimicrobials:  Vanc 5/08 >>  Zosyn 5/08>>  HPI/Subjective: Feels better. Breathing is the same as yesterday. Still has mild productive cough. Denies any nausea and vomiting. Mild left sided back and side pain. Pain has been chronic. Still does not have an appetite.  Objective: Filed Vitals:   07/18/15 1415 07/18/15 2045 07/18/15 2138  07/19/15 0658  BP:   127/80 122/82  Pulse:   103 87  Temp:   98.4 F (36.9 C) 97.7 F (36.5 C)  TempSrc:   Oral Oral  Resp:   20 20  Height:      Weight:      SpO2: 95% 95% 87% 94%    Intake/Output Summary (Last 24 hours) at 07/19/15 0740 Last data filed at 07/18/15 1800  Gross per 24 hour  Intake    720 ml  Output      0 ml  Net    720 ml     Filed Weights   07/16/15 1213  Weight: 76.204 kg (168 lb)   Exam:    Constitutional:  Appears calm and comfortable, Appears weak, chronically ill Respiratory:  CTA bilaterally, no w/r/r.  Respiratory effort normal. No retractions or accessory muscle use Cardiovascular:  RRR, no m/r/g No LE extremity edema   Psychiatric:  judgement and insight appear normal Mental status Mood, affect appropriate  I have personally reviewed following labs and imaging studies:  CBG stable.   Scheduled Meds: . docusate sodium  100 mg Oral BID  . enoxaparin (LOVENOX) injection  40 mg Subcutaneous Q24H  . feeding supplement (ENSURE ENLIVE)  237 mL Oral BID BM  . FLUoxetine  20 mg Oral Daily  . guaiFENesin  1,200 mg Oral BID  . insulin aspart  0-5 Units Subcutaneous QHS  . insulin aspart  0-9 Units Subcutaneous TID WC  . ipratropium-albuterol  3 mL Nebulization TID  . Ledipasvir-Sofosbuvir  1 tablet Oral Daily  . levothyroxine  25 mcg Oral QAC breakfast  .  oxyCODONE  20 mg Oral Q8H  . piperacillin-tazobactam (ZOSYN)  IV  3.375 g Intravenous Q8H  . banana bag IV 1000 mL   Intravenous Once  . tiotropium  18 mcg Inhalation Daily  . vancomycin  1,000 mg Intravenous Q8H   Continuous Infusions:    Principal Problem:   Healthcare-associated pneumonia Active Problems:   COPD (chronic obstructive pulmonary disease) (HCC)   Esophageal cancer (HCC)   Hepatitis C   Diabetes mellitus without complication, with long-term current use of insulin (HCC)   Lobar pneumonia ([place])   Hypoxemia   LOS: 3 days   Time spent 15 minutes   By signing  my name below, I, Rennis Harding attest that this documentation has been prepared under the direction and in the presence of Murray Hodgkins, MD Electronically signed: Rennis Harding 07/19/2015 2:50pm   I personally performed the services described in this documentation. All medical record entries made by the scribe were at my direction. I have reviewed the chart and agree that the record reflects my personal performance and is accurate and complete. Murray Hodgkins, MD

## 2015-07-19 NOTE — Progress Notes (Signed)
Patients O2 sat on 4 liters by [place] 98% resting. Resting RA O2 Sat 90%. Ambulating on room air, O2 Sat 84%. O2 reapplied at 2 liters by [place] O2 sat 94%.

## 2015-07-19 NOTE — Discharge Summary (Signed)
Physician Discharge Summary  Brent Franco [id number] DOB: Jan 07, 1951 DOA: 07/16/2015  PCP: [name], NP  Admit date: 07/16/2015 Discharge date: 07/21/2015  Recommendations for Outpatient Follow-up:   Follow up resolution of pneumonia   Newly started on oxygen for pneumonia and underlying COPD  Follow-up CT of the chest in 3-6 months to evaluate for interval changes or clearing of the pulmonary nodules in the right upper lobe of the lung.   Follow-up Information    Follow up with Jani Gravel, MD On 08/08/2015.   Specialty:  Internal Medicine   Why:  2pm bring meds and insurance card   Contact information:   [address] [place] [address] [phone]      Discharge Diagnoses:  1. HCAP 2. Acute respiratory failure with hypoxia 3. DM type 2 4. COPD 5. Hepatitis C 6. Depression  Discharge Condition: improved Disposition: discharge home  Diet recommendation: carb modified.  Filed Weights   07/16/15 1213  Weight: 76.204 kg (168 lb)    History of present illness:  61 yom with a hx of esophageal CA s/p chemoradiation resented with complaints of SOB, fatigue, and malaise, He was recently treated at [facility] one week prior for pneumonia. He became ill and on presentation wife reported oxygen saturation in the 80s. While in the ED, a CXR showed progressive infiltrates.  Hospital Course:  Treated with empiric vancomycin and Zosyn with gradual improvement. Remains hypoxic on room air and will go home on oxygen. Hospitalization was uncomplicated, individual issues as below.  Individual issues as below:  1. HCAP. Much improved with Zosyn and vancomycin. 2. Acute hypoxic respiratory failure. Secondary to pneumonia, underlying COPD. Discharge with home oxygen.  3. DM type 2, stable. 4. COPD. Stable. No evidence of wheezing.  5. Chronic pain syndrome. Stable. 6. Hepatitis C. Continue Harvoni 7. Depression. Stable. Continue home medications.   8. Non-severe malnutrition in the context of acute illness/injury.   Consultants:  none  Procedures:  none  Antimicrobials:  Vanc 5/08 >>5/13  Zosyn 5/08>>5/13  Augmentin 5/13>>5/16  Doxycycline 5/13>>5/16  Discharge Instructions  Discharge Instructions    Diet - low sodium heart healthy    Complete by:  As directed      Diet Carb Modified    Complete by:  As directed      Discharge instructions    Complete by:  As directed   Call your physician or seek immediate medical attention for fever, shortness of breath or worsening of condition.     Increase activity slowly    Complete by:  As directed           Discharge Medication List as of 07/21/2015 12:33 PM    START taking these medications   Details  amoxicillin-clavulanate (AUGMENTIN) 875-125 MG tablet Take 1 tablet by mouth every 12 (twelve) hours., Starting 07/21/2015, Until Discontinued, Normal    doxycycline (VIBRA-TABS) 100 MG tablet Take 1 tablet (100 mg total) by mouth every 12 (twelve) hours., Starting 07/21/2015, Until Discontinued, Normal      CONTINUE these medications which have NOT CHANGED   Details  ACCU-CHEK AVIVA PLUS test strip 2 (two) times daily. use for testing, Starting 05/02/2015, Until Discontinued, Historical Med    acetaminophen (TYLENOL) 500 MG tablet Take 500 mg by mouth every 6 (six) hours as needed for fever., Until Discontinued, Historical Med    albuterol (PROVENTIL HFA;VENTOLIN HFA) 108 (90 BASE) MCG/ACT inhaler Inhale 1-2 puffs into the lungs every 6 (six) hours as  needed for wheezing or shortness of breath. , Until Discontinued, Historical Med    ALPRAZolam (XANAX) 1 MG tablet Take 1 tablet (1 mg total) by mouth every 8 (eight) hours as needed for anxiety., Starting 05/21/2015, Until Discontinued, Print    docusate sodium (COLACE) 100 MG capsule Take 100 mg by mouth 2 (two) times daily., Starting 03/10/2015, Until Discontinued, Historical Med    FLUoxetine (PROZAC) 20 MG capsule  Take 1 capsule (20 mg total) by mouth daily., Starting 06/11/2015, Until Discontinued, Normal    HUMULIN R 100 UNIT/ML injection Inject 2-12 Units into the skin 2 (two) times daily as needed for high blood sugar. 151-200 = 2 units, 201-250 = 4 units, 251-300 = 6 units, 301-350 = 8 units, 351-400 = 10 units, 401-450 = 12 units., Starting 03/13/2015, Until Discontinued, Historical Med    ipratropium-albuterol (DUONEB) 0.5-2.5 (3) MG/3ML SOLN INHALE CONTENTS OF 1 VIAL IN NEBULIZER EVERY 4 HOURS AS NEEDED FOR SHORTNESS OF BREATH., Historical Med    Ledipasvir-Sofosbuvir (HARVONI) 90-400 MG TABS Take 1 tablet by mouth daily. , Until Discontinued, Historical Med    levothyroxine (SYNTHROID, LEVOTHROID) 25 MCG tablet Take 25 mcg by mouth daily., Starting 06/27/2015, Until Discontinued, Historical Med    lidocaine (XYLOCAINE) 2 % solution MIX 50/50 WITH WATER AND SWALLOW 10MLS 30 MINUTES BEFORE MEALS AND 30 MINUTES BEFORE BEDTIME, Historical Med    lidocaine-prilocaine (EMLA) cream Apply a quarter size amount to port site 1 hour prior to chemo. Do not rub in. Cover with plastic wrap., Normal    meloxicam (MOBIC) 7.5 MG tablet TAKE ONE TABLET BY MOUTH ONCE DAILY, Historical Med    metFORMIN (GLUCOPHAGE) 500 MG tablet Take 500 mg by mouth 2 (two) times daily., Starting 03/10/2015, Until Discontinued, Historical Med    nitroGLYCERIN (NITROSTAT) 0.4 MG SL tablet Place 1 tablet (0.4 mg total) under the tongue every 5 (five) minutes as needed for chest pain., Starting 09/21/2014, Until Discontinued, Normal    ondansetron (ZOFRAN) 8 MG tablet TAKE 1 TABLET (8 MG TOTAL) BY MOUTH EVERY 8 (EIGHT) HOURS AS NEEDED FOR NAUSEA OR VOMITING., Normal    oxyCODONE (OXYCONTIN) 20 mg 12 hr tablet Take 1 tablet (20 mg total) by mouth every 8 (eight) hours., Starting 06/11/2015, Until Discontinued, Print    oxyCODONE-acetaminophen (ROXICET) 5-325 MG tablet Take 1-2 tablets by mouth every 6 (six) hours as needed for moderate pain  or severe pain., Starting 06/11/2015, Until Discontinued, Print    prochlorperazine (COMPAZINE) 10 MG tablet TAKE 1 TABLET (10 MG TOTAL) BY MOUTH EVERY 6 (SIX) HOURS AS NEEDED FOR NAUSEA OR VOMITING., Normal    SPIRIVA HANDIHALER 18 MCG inhalation capsule Place 1 puff into inhaler and inhale daily., Starting 12/04/2014, Until Discontinued, Historical Med    sucralfate (CARAFATE) 1 g tablet Take 1 tablet by mouth 2 (two) times daily as needed (throat pain). , Starting 05/10/2015, Until Discontinued, Historical Med    tetrahydrozoline 0.05 % ophthalmic solution Place 1 drop into both eyes daily as needed (dry eyes)., Until Discontinued, Historical Med      STOP taking these medications     levofloxacin (LEVAQUIN) 750 MG tablet        Allergies  Allergen Reactions  . Demerol [Meperidine] Rash    The results of significant diagnostics from this hospitalization (including imaging, microbiology, ancillary and laboratory) are listed below for reference.    Significant Diagnostic Studies: Dg Chest 2 View  07/16/2015  CLINICAL DATA:  Shortness breath with cough and congestion since  yesterday. History of COPD and esophageal cancer. EXAM: CHEST  2 VIEW COMPARISON:  07/08/2013, 07/08/2015 and 07/07/2015 FINDINGS: The right jugular Port-A-Cath is stable and positioned at the superior cavoatrial junction. There are increased densities at the left lung base concerning for airspace disease. Again noted are densities along the medial right lung base but there are increased densities along the right costophrenic angle. Few patchy densities along the periphery of the right upper lung may also be new or enlarged. Heart size is stable. The trachea is midline. No large pleural effusions. No suspicious bone findings. IMPRESSION: Increased densities at both lung bases, left side greater than right. Findings are concerning for pneumonia. Slightly increased disease in the right upper lung. Electronically Signed   By: Markus Daft M.D.   On: 07/16/2015 12:56   Microbiology: Recent Results (from the past 240 hour(s))  Culture, blood (routine x 2) Call MD if unable to obtain prior to antibiotics being given     Status: None   Collection Time: 07/16/15  5:58 PM  Result Value Ref Range Status   Specimen Description BLOOD LEFT ARM  Final   Special Requests BOTTLES DRAWN AEROBIC AND ANAEROBIC [place]  Final   Culture NO GROWTH 5 DAYS  Final   Report Status 07/21/2015 FINAL  Final  Culture, blood (routine x 2) Call MD if unable to obtain prior to antibiotics being given     Status: None   Collection Time: 07/16/15  6:02 PM  Result Value Ref Range Status   Specimen Description BLOOD LEFT HAND  Final   Special Requests BOTTLES DRAWN AEROBIC AND ANAEROBIC [facility]  Final   Culture NO GROWTH 5 DAYS  Final   Report Status 07/21/2015 FINAL  Final     Labs: Basic Metabolic Panel:  Recent Labs Lab 07/16/15 1237 07/17/15 0609 07/18/15 0615 07/20/15 0523  NA 138 138 138 137  K 4.2 3.9 4.3 3.9  CL 100* 101 102 101  CO2 29 28 29 28   GLUCOSE 98 120* 109* 93  BUN 10 6 5* 6  CREATININE 0.73 0.61 0.66 0.63  CALCIUM 9.0 8.7* 8.5* 8.6*   Liver Function Tests:  Recent Labs Lab 07/16/15 1237  AST 26  ALT 17  ALKPHOS 94  BILITOT 0.6  PROT 7.9  ALBUMIN 3.1*   CBC:  Recent Labs Lab 07/16/15 1237 07/17/15 0609 07/18/15 0615  WBC 7.5 5.8 5.7  NEUTROABS 4.5  --   --   HGB 12.6* 11.4* 11.2*  HCT 37.5* 34.4* 33.6*  MCV 92.4 91.7 92.3  PLT 245 215 218    CBG:  Recent Labs Lab 07/20/15 1152 07/20/15 1636 07/20/15 2125 07/21/15 0750 07/21/15 1131  GLUCAP 186* 134* 105* 110* 112*    Principal Problem:   Healthcare-associated pneumonia Active Problems:   COPD (chronic obstructive pulmonary disease) (HCC)   Esophageal cancer (HCC)   Hepatitis C   Diabetes mellitus without complication, with long-term current use of insulin (HCC)   Lobar pneumonia ([place])   Hypoxemia   Malnutrition of moderate  degree   Acute respiratory failure with hypoxia ([place])   Time coordinating discharge: 35 minutes  Signed:  Murray Hodgkins, MD [facility] 07/21/2015, 6:58 PM   By signing my name below, I, Rennis Harding attest that this documentation has been prepared under the direction and in the presence of Murray Hodgkins, MD Electronically signed: Rennis Harding  07/21/2015 11:20am

## 2015-07-20 DIAGNOSIS — E440 Moderate protein-calorie malnutrition: Secondary | ICD-10-CM | POA: Insufficient documentation

## 2015-07-20 DIAGNOSIS — J9601 Acute respiratory failure with hypoxia: Secondary | ICD-10-CM

## 2015-07-20 LAB — BASIC METABOLIC PANEL
Anion gap: 8 (ref 5–15)
BUN: 6 mg/dL (ref 6–20)
CALCIUM: 8.6 mg/dL — AB (ref 8.9–10.3)
CO2: 28 mmol/L (ref 22–32)
CREATININE: 0.63 mg/dL (ref 0.61–1.24)
Chloride: 101 mmol/L (ref 101–111)
GFR calc Af Amer: >60 mL/min (ref >60)
GLUCOSE: 93 mg/dL (ref 65–99)
POTASSIUM: 3.9 mmol/L (ref 3.5–5.1)
Sodium: 137 mmol/L (ref 135–145)

## 2015-07-20 LAB — GLUCOSE, CAPILLARY
GLUCOSE-CAPILLARY: 105 mg/dL — AB (ref 65–99)
Glucose-Capillary: 94 mg/dL (ref 65–99)
Glucose-Capillary: 186 mg/dL — ABNORMAL HIGH (ref 65–99)
Glucose-Capillary: 134 mg/dL — ABNORMAL HIGH (ref 65–99)

## 2015-07-20 MED ORDER — IPRATROPIUM-ALBUTEROL 0.5-2.5 (3) MG/3ML IN SOLN
3.0000 mL | Freq: Two times a day (BID) | RESPIRATORY_TRACT | Status: DC
  Administered 2015-07-20 – 2015-07-21 (×2): 3 mL via RESPIRATORY_TRACT
  Filled 2015-07-20 (×2): qty 3

## 2015-07-20 NOTE — Progress Notes (Signed)
PROGRESS NOTE  Brent Franco [id number] DOB: May 16, 1950 DOA: 07/16/2015 PCP: [name], NP  Brief Narrative: 24 yom with a hx of esophageal CA s/p chemoradiation resented with complaints of SOB, fatigue, and malaise, He was recently treated at [facility] one week prior for pneumonia. He became ill and on presentation wife reported oxygen saturation in the 80s. PWhile in the ED, a CXR showed progressive infiltrates and his sats were 92% on RA. He was referred for PNA.  Assessment/Plan: 1. HCAP, continues to slowly improve.  2. Acute hypoxic respiratory failure. Secondary to pneumonia, underlying COPD. Slowly improving.  3. Esophageal CA. S/p chemoradiation course. Continue on soft diet. 4. DM type 2, remains stable 5. Hx of alcohol abuse. Remains stable. 6. COPD. remains stable. No evidence of wheezing.  7. Chronic pain syndrome. Stable. 8. Hepatitis C. Currently on Harvoni 9. Depression. 10. Non-severe malnutrition in the context of acute illness/injury. Nutrition following.    Gradually improving.   Continue IV abx and plan to transition to oral in the am.  Anticipate discharge in 24 hours on home O2.  Recommend follow-up CT of the chest in 3-6 months to evaluate for interval changes or clearing of the pulmonary nodules in the right upper lobe of the lung.  DVT prophylaxis: Lovenox Code Status: Full Family Communication: Discussed with patient. daughter  wife at bedside. Disposition Plan: Anticipate discharge in 24 hours.  Murray Hodgkins, MD  [facility] Direct contact:  --Via amion app OR  --[email or web address]; password [email or web address] and click  [phone] contact night coverage as above 07/20/2015, 8:17 AM  LOS: 4 days   Consultants:  none  Procedures:  none  Antimicrobials:  Vanc 5/08 >>  Zosyn 5/08>>  HPI/Subjective: Unable to sleep due to hospital activity. Breathing has improved and he is otherwise feeling fine. Has been able to eat without difficulty. Poor  appetite.  Objective: Filed Vitals:   07/19/15 1334 07/19/15 1533 07/19/15 2124 07/20/15 0558  BP: 108/64  99/55 107/64  Pulse: 100  80 83  Temp: 98.2 F (36.8 C)  97.7 F (36.5 C) 97.8 F (36.6 C)  TempSrc: Oral  Oral Oral  Resp: 20  20 20   Height:      Weight:      SpO2: 99% 87% 97% 96%    Intake/Output Summary (Last 24 hours) at 07/20/15 0817 Last data filed at 07/19/15 1730  Gross per 24 hour  Intake    420 ml  Output      0 ml  Net    420 ml     Filed Weights   07/16/15 1213  Weight: 76.204 kg (168 lb)   Exam:    Constitutional:  Appears weak, chronically ill, nontoxic. Somewhat better today. Respiratory:  CTA bilaterally, no w/r/r.  Respiratory effort normal. No retractions or accessory muscle use.  Speaks in full sentences.  Cardiovascular:  RRR, no m/r/g No LE extremity edema   Psychiatric:  judgement and insight appear normal Mental status Mood, affect appropriate   I have personally reviewed following labs and imaging studies:  CBG stable.   BMP unremarkable.   Scheduled Meds: . docusate sodium  100 mg Oral BID  . enoxaparin (LOVENOX) injection  40 mg Subcutaneous Q24H  . feeding supplement (ENSURE ENLIVE)  237 mL Oral BID BM  . FLUoxetine  20 mg Oral Daily  . guaiFENesin  1,200 mg Oral BID  . insulin aspart  0-5 Units Subcutaneous QHS  . insulin aspart  0-9 Units Subcutaneous TID  WC  . ipratropium-albuterol  3 mL Nebulization TID  . Ledipasvir-Sofosbuvir  1 tablet Oral Daily  . levothyroxine  25 mcg Oral QAC breakfast  . oxyCODONE  20 mg Oral Q8H  . piperacillin-tazobactam (ZOSYN)  IV  3.375 g Intravenous Q8H  . banana bag IV 1000 mL   Intravenous Once  . tiotropium  18 mcg Inhalation Daily  . vancomycin  1,000 mg Intravenous Q8H   Continuous Infusions:    Principal Problem:   Healthcare-associated pneumonia Active Problems:   COPD (chronic obstructive pulmonary disease) (HCC)   Esophageal cancer (HCC)   Hepatitis C   Diabetes  mellitus without complication, with long-term current use of insulin (HCC)   Lobar pneumonia (HCC)   Hypoxemia   Malnutrition of moderate degree   LOS: 4 days   Time spent 15 minutes   By signing my name below, I, Rennis Harding attest that this documentation has been prepared under the direction and in the presence of Murray Hodgkins, MD Electronically signed: Rennis Harding 07/20/2015 12:03pm  I personally performed the services described in this documentation. All medical record entries made by the scribe were at my direction. I have reviewed the chart and agree that the record reflects my personal performance and is accurate and complete. Murray Hodgkins, MD

## 2015-07-20 NOTE — Care Management Note (Signed)
Case Management Note  Patient Details  Name: Brent Franco MRN: [id number] Date of Birth: 25-Sep-1950   Expected Discharge Date:     07/25/2015             Expected Discharge Plan:  Home/Self Care  In-House Referral:  NA  Discharge planning Services  CM Consult, Follow-up appt scheduled  Post Acute Care Choice:  Durable Medical Equipment Choice offered to:  Patient  DME Arranged:  Oxygen DME Agency:  [facility]  HH Arranged:    [place] Agency:     Status of Service:  Completed, signed off  Medicare Important Message Given:    Date Medicare IM Given:    Medicare IM give by:    Date Additional Medicare IM Given:    Additional Medicare Important Message give by:     If discussed at [place] of Stay Meetings, dates discussed:    Additional Comments: [place] home over weekend. Pt's O2 has been delivered to pt's room. Pt will need requalifing sats if not discharged on Saturday.  No further needs identified.   Sherald Barge, RN 07/20/2015, 12:08 PM

## 2015-07-21 DIAGNOSIS — E440 Moderate protein-calorie malnutrition: Secondary | ICD-10-CM

## 2015-07-21 LAB — CULTURE, BLOOD (ROUTINE X 2)
CULTURE: NO GROWTH
Culture: NO GROWTH

## 2015-07-21 LAB — GLUCOSE, CAPILLARY
GLUCOSE-CAPILLARY: 112 mg/dL — AB (ref 65–99)
Glucose-Capillary: 110 mg/dL — ABNORMAL HIGH (ref 65–99)

## 2015-07-21 MED ORDER — HEPARIN SOD (PORK) LOCK FLUSH 100 UNIT/ML IV SOLN
500.0000 [IU] | Freq: Once | INTRAVENOUS | Status: AC
Start: 2015-07-21 — End: 2015-07-21
  Administered 2015-07-21: 500 [IU] via INTRAVENOUS
  Filled 2015-07-21: qty 5

## 2015-07-21 MED ORDER — DOXYCYCLINE HYCLATE 100 MG PO TABS: 100.0000 mg | ORAL_TABLET | Freq: Two times a day (BID) | ORAL | Status: DC

## 2015-07-21 MED ORDER — AMOXICILLIN-POT CLAVULANATE 875-125 MG PO TABS: 1.0000 | ORAL_TABLET | Freq: Two times a day (BID) | ORAL | Status: DC

## 2015-07-21 NOTE — Progress Notes (Signed)
CM contacted [facility] and oxygen delivery arranged.

## 2015-07-21 NOTE — Progress Notes (Signed)
Pharmacy Antibiotic Note  Brent Franco is a 65 y.o. male admitted on 07/16/2015 with pneumonia (HCAP).  Pharmacy has been consulted for Vancomycin and Zosyn  Dosing.   Renal fxn is stable.  Blood cx's are negative to date. Day #6 of therapy. Patient slowly improving. Afebrile. Plan to transition to oral antibiotics today per MD's note.  Plan: Vancomycin 1gm IV every 8 hours.  Goal trough 15-20 mcg/mL. Zosyn 3.375g IV q8h (4 hour infusion). Transition to po abx as indicated Monitor labs, micro and vitals.  Height: 6\' 1"  (185.4 cm) Weight: 168 lb (76.204 kg) IBW/kg (Calculated) : 79.9  Temp (24hrs), Avg:97.8 F (36.6 C), Min:97.4 F (36.3 C), Max:98.2 F (36.8 C)   Recent Labs Lab 07/16/15 1237 07/17/15 0609 07/18/15 0615 07/20/15 0523  WBC 7.5 5.8 5.7  --   CREATININE 0.73 0.61 0.66 0.63  VANCOTROUGH  --   --  18  --     Estimated Creatinine Clearance: 101.9 mL/min (by C-G formula based on Cr of 0.63).    Allergies  Allergen Reactions  . Demerol [Meperidine] Rash   Antimicrobials this admission: Vanc 5/8 >>  Zosyn 5/8 >>   Dose adjustments this admission: None needed  Microbiology results: 5/8 BCx: pending 5/8 Sputum: pending   Thank you for allowing pharmacy to be a part of this patient's care. Isac Sarna, BS Pharm D, [place] Clinical Pharmacist Pager [phone]  07/21/2015 10:55 AM

## 2015-07-21 NOTE — Progress Notes (Signed)
PROGRESS NOTE  Brent Franco [id number] DOB: 1950/09/13 DOA: 07/16/2015 PCP: [name], NP  Brief Narrative: 18 yom with a hx of esophageal CA s/p chemoradiation resented with complaints of SOB, fatigue, and malaise, He was recently treated at [facility] one week prior for pneumonia. He became ill and on presentation wife reported oxygen saturation in the 80s. PWhile in the ED, a CXR showed progressive infiltrates and his sats were 92% on RA. He was referred for PNA.  Assessment/Plan: 1. HCAP. Much improved. 2. Acute hypoxic respiratory failure. Secondary to pneumonia, underlying COPD. Discharge with home oxygen.  3. DM type 2, stable. 4. COPD. Stable. No evidence of wheezing.  5. Chronic pain syndrome. Stable. 6. Hepatitis C. Continue Harvoni 7. Depression. Stable. Continue home medications.  8. Non-severe malnutrition in the context of acute illness/injury.    Rapidly improving.  Discharge on oral abx, complete Augmentin and Doxycycline 5/16.     Murray Hodgkins, MD  [facility] Direct contact:  --Via amion app OR  --[email or web address]; password [email or web address] and click  [email or web address] contact night coverage as above 07/21/2015, 11:39 AM  LOS: 5 days   Consultants:  none  Procedures:  none  Antimicrobials:  Vanc 5/08 >>5/13  Zosyn 5/08>>5/13  Augmentin 5/13>>5/16  Doxycycline 5/13>>5/16  HPI/Subjective: Feeling better. Cough has greatly improved. Breathing better.  Objective: Filed Vitals:   07/20/15 2122 07/21/15 0653 07/21/15 0812 07/21/15 0817  BP: 106/76 143/54    Pulse: 93 78    Temp: 97.7 F (36.5 C) 98.2 F (36.8 C)    TempSrc: Oral Oral    Resp: 20 20    Height:      Weight:      SpO2: 95% 100% 91% 96%    Intake/Output Summary (Last 24 hours) at 07/21/15 1139 Last data filed at 07/20/15 1322  Gross per 24 hour  Intake     60 ml  Output      0 ml  Net     60 ml     Filed Weights   07/16/15 1213  Weight: 76.204 kg (168 lb)   Exam:      Constitutional:  Appears better today. Calm and comfortable lying in bed.  Respiratory:  CTA bilaterally, no w/r/r.  Respiratory effort normal. No retractions or accessory muscle use.  Speaks in full sentences.  Cardiovascular:  RRR, no m/r/g  I have personally reviewed following labs and imaging studies:  CBG stable.     Scheduled Meds: . docusate sodium  100 mg Oral BID  . enoxaparin (LOVENOX) injection  40 mg Subcutaneous Q24H  . feeding supplement (ENSURE ENLIVE)  237 mL Oral BID BM  . FLUoxetine  20 mg Oral Daily  . guaiFENesin  1,200 mg Oral BID  . insulin aspart  0-5 Units Subcutaneous QHS  . insulin aspart  0-9 Units Subcutaneous TID WC  . ipratropium-albuterol  3 mL Nebulization BID  . Ledipasvir-Sofosbuvir  1 tablet Oral Daily  . levothyroxine  25 mcg Oral QAC breakfast  . oxyCODONE  20 mg Oral Q8H  . piperacillin-tazobactam (ZOSYN)  IV  3.375 g Intravenous Q8H  . banana bag IV 1000 mL   Intravenous Once  . tiotropium  18 mcg Inhalation Daily  . vancomycin  1,000 mg Intravenous Q8H   Continuous Infusions:    Principal Problem:   Healthcare-associated pneumonia Active Problems:   COPD (chronic obstructive pulmonary disease) (HCC)   Esophageal cancer (HCC)   Hepatitis C   Diabetes mellitus without complication, with  long-term current use of insulin (HCC)   Lobar pneumonia ([place])   Hypoxemia   Malnutrition of moderate degree   Acute respiratory failure with hypoxia ([place])   LOS: 5 days   By signing my name below, I, Rennis Harding attest that this documentation has been prepared under the direction and in the presence of Murray Hodgkins, MD Electronically signed: Rennis Harding 07/21/2015 11:40am   I personally performed the services described in this documentation. All medical record entries made by the scribe were at my direction. I have reviewed the chart and agree that the record reflects my personal performance and is accurate and complete. Murray Hodgkins, MD

## 2015-07-24 ENCOUNTER — Encounter (HOSPITAL_COMMUNITY): Payer: Self-pay | Admitting: Lab

## 2015-07-24 ENCOUNTER — Encounter (HOSPITAL_COMMUNITY): Payer: Medicaid Other

## 2015-07-24 ENCOUNTER — Encounter (HOSPITAL_COMMUNITY): Payer: Medicaid Other | Attending: Hematology & Oncology | Admitting: Hematology & Oncology

## 2015-07-24 ENCOUNTER — Encounter (HOSPITAL_COMMUNITY): Payer: Self-pay | Admitting: Hematology & Oncology

## 2015-07-24 DIAGNOSIS — R740 Nonspecific elevation of levels of transaminase and lactic acid dehydrogenase [LDH]: Secondary | ICD-10-CM | POA: Insufficient documentation

## 2015-07-24 DIAGNOSIS — K7689 Other specified diseases of liver: Secondary | ICD-10-CM | POA: Diagnosis not present

## 2015-07-24 DIAGNOSIS — C155 Malignant neoplasm of lower third of esophagus: Secondary | ICD-10-CM | POA: Diagnosis not present

## 2015-07-24 DIAGNOSIS — F329 Major depressive disorder, single episode, unspecified: Secondary | ICD-10-CM

## 2015-07-24 DIAGNOSIS — K229 Disease of esophagus, unspecified: Secondary | ICD-10-CM | POA: Insufficient documentation

## 2015-07-24 DIAGNOSIS — J189 Pneumonia, unspecified organism: Secondary | ICD-10-CM

## 2015-07-24 DIAGNOSIS — C159 Malignant neoplasm of esophagus, unspecified: Secondary | ICD-10-CM

## 2015-07-24 DIAGNOSIS — K769 Liver disease, unspecified: Secondary | ICD-10-CM

## 2015-07-24 DIAGNOSIS — F32A Depression, unspecified: Secondary | ICD-10-CM

## 2015-07-24 MED ORDER — HEPARIN SOD (PORK) LOCK FLUSH 100 UNIT/ML IV SOLN
INTRAVENOUS | Status: AC
  Filled 2015-07-24: qty 5

## 2015-07-24 MED ORDER — OXYCODONE-ACETAMINOPHEN 10-325 MG PO TABS: 1.0000 | ORAL_TABLET | ORAL | Status: DC | PRN

## 2015-07-24 MED ORDER — ALPRAZOLAM 1 MG PO TABS: 1.0000 mg | ORAL_TABLET | Freq: Three times a day (TID) | ORAL | Status: AC | PRN

## 2015-07-24 MED ORDER — OXYCODONE HCL ER 20 MG PO T12A: 20.0000 mg | EXTENDED_RELEASE_TABLET | Freq: Three times a day (TID) | ORAL | Status: DC

## 2015-07-24 MED ORDER — HEPARIN SOD (PORK) LOCK FLUSH 100 UNIT/ML IV SOLN: 500.0000 [IU] | Freq: Once | INTRAVENOUS | Status: DC

## 2015-07-24 MED ORDER — SODIUM CHLORIDE 0.9% FLUSH: 20.0000 mL | INTRAVENOUS | Status: DC | PRN

## 2015-07-24 NOTE — Patient Instructions (Addendum)
[place] at [facility] Discharge Instructions  RECOMMENDATIONS MADE BY THE CONSULTANT AND ANY TEST RESULTS WILL BE SENT TO YOUR REFERRING PHYSICIAN.  You need a Speech Language Pathology referral for questionable aspiration. Amy to send the information over to them.   Return in 1 week with Tom  Refills given on Oxycodone 20mg , Percocet, and Xanax  Thank you for choosing [place] at [facility] to provide your oncology and hematology care.  To afford each patient quality time with our provider, please arrive at least 15 minutes before your scheduled appointment time.   Beginning January 23rd 2017 lab work for the [facility] will be done in the  Main lab at [facility] on 1st floor. If you have a lab appointment with the [facility] please come in thru the  Main Entrance and check in at the main information desk  You need to re-schedule your appointment should you arrive 10 or more minutes late.  We strive to give you quality time with our providers, and arriving late affects you and other patients whose appointments are after yours.  Also, if you no show three or more times for appointments you may be dismissed from the clinic at the providers discretion.     Again, thank you for choosing [facility].  Our hope is that these requests will decrease the amount of time that you wait before being seen by our physicians.       _____________________________________________________________  Should you have questions after your visit to [facility], please contact our office at [phone] between the hours of 8:30 a.m. and 4:30 p.m.  Voicemails left after 4:30 p.m. will not be returned until the following business day.  For prescription refill requests, have your pharmacy contact our office.         Resources For Cancer Patients and their Caregivers ? American Cancer Society: Can assist with transportation,  wigs, general needs, runs Look Good Feel Better.        607-536-7117 ? Cancer Care: Provides financial assistance, online support groups, medication/co-pay assistance.  1-800-813-HOPE (203)035-9022) ? [facility] Assists [place] cancer patients and their families through emotional , educational and financial support.  2294190871 ? [place] DSS Where to apply for food stamps, Medicaid and utility assistance. 864-475-6670 ? [facility]: Transportation to medical appointments. 563 467 2467 ? Social Security Administration: May apply for disability if have a Stage IV cancer. 857-242-7642 347-330-5247 ? [employer], Disability and Transit Services: Assists with nutrition, care and transit needs. [place] Support Programs: @10RELATIVEDAYS @ > Cancer Support Group  2nd Tuesday of the month 1pm-2pm, Journey Room  > Creative Journey  3rd Tuesday of the month 1130am-1pm, Journey Room  > Look Good Feel Better  1st Wednesday of the month 10am-12 noon, Journey Room (Call [place] to register [phone])

## 2015-07-24 NOTE — Progress Notes (Signed)
Brent Franco at [facility] NOTE  Patient Care Team: [name], NP as PCP - General (Nurse Practitioner) Daneil Dolin, MD as Consulting Physician (Gastroenterology)  CHIEF COMPLAINTS:  EGD 03/26/2015 with bulky esophageal tumor involving the distal esophagus encroaching significantly upon the lumen. Lesion begins at 35cm from the incisors and extends down to 39 cm approximately 1 cm above the GE junction. No varices were noted, no portal gastropathy noted.  Pathology with invasive poorly differentiated carcinoma, extensive ulceration with fungal organisms (c/w candida)    Esophageal cancer ([place])   03/08/2015 Imaging CT chest at [facility]- abnormal adenopathy (hilar and mediastinal)   03/26/2015 Procedure EGD by Dr. Gala Romney with abnormal findings and biopsy   03/26/2015 Pathology Results Esophagus, biopsy - INVASIVE POORLY DIFFERENTIATED CARCINOMA, SEE COMMENT. - EXTENSIVE ULCERATION WITH FUNGAL ORGANISMS.   04/05/2015 PET scan Elongated distal esophageal mass is markedly hypermetabolic. Small mediastinal lymph nodes are not hypermetabolic. Single 10 mm gastrohepatic ligament lymph node which is hypermetabolic. Suspect a right hepatic lobe metastatic lesion.   04/15/2015 - 04/21/2015 Hospital Admission [facility]   04/24/2015 - 05/31/2015 Radiation Therapy Dr. Isidore Moos.  45Gy in 25 fractions to distal esophagus and lymph nodes and 5.4 Gy in 3 fractions to esophagus and lymph node boost   04/30/2015 Imaging MRI abd- 1.5 cm hepatic lesion in segment 7, corresponding to the hypermetabolic lesion on CT, suspicious for metastasis. Two upper abdominal lymph nodes measuring up to 1.3 cm short axis, suspicious for nodal metastases.   04/30/2015 - 05/28/2015 Chemotherapy Weekly Carboplatin/Paclitaxel with XRT   07/02/2015 Miscellaneous Harvoni for hepatitis C   07/08/2015 Imaging Patchy bilateral pneumonia, negative pulm embolism. scattered pulm nodules 1 cm in the RUL, could be infectious but needs  chest CT after treatment to exlude new pulmonary metastases. Suggestion of new liver lesion   07/16/2015 - 07/21/2015 Hospital Admission [facility], Acute respiratory failure with hypoxia, COPD     HISTORY OF PRESENTING ILLNESS:  Brent Franco 65 y.o. male is here for follow-up of esophageal carcinoma. He has completed concurrent therapy.   Brent Franco returns to the [facility] today accompanied by his wife. He is asleep on the exam table at the start of his appointment.  His wife says she isn't sure how he recently came down with pneumonia since "we haven't been anywhere; we've online shopped for our groceries." She worries about potential aspiration. When asked if she can hear him choking or coughing at night, she says she's never heard him choking at night, and that he's hardly ever coughing. The only time he ever coughs anything up is when "they are trying to get him to cough up phlegm before he eats," but that's all she can think of. He notes that he prefers to eat small amounts.  His wife notes that they are starting week 6 of his Harvoni treatment tomorrow, but that they've been wiped out since he's had pneumonia. She adds that they were also in the hospital during the week before, for a day and a half to two days..She also adds that this wiped his appetite out and he couldn't eat, and he's lost weight as a result. She says "he tried."  He has not had a pneumonia vaccine.  She adds that they need to find another PCP come June 15, and received a recommendation to work with Dr. Maudie Mercury.  She notes that they already have a concentrator at home for oxygen, in case he needs to remain on it.  His wife notes that sometimes she gives him oxycontin every 7 hours.   Major complaints today include weight loss, poor appetite and fatigue. He has a PET/CT ordered for June.    MEDICAL HISTORY:  Past Medical History  Diagnosis Date  . COPD (chronic obstructive pulmonary disease) ([place])   . ETOH abuse      stopped 10 years ago  . Depression     suicide attempt 20 years ago  . Arthritis   . Asthma   . Viral hepatitis C   . Diabetes mellitus without complication ([place])   . Hypothyroidism   . Cancer ([place])     esophageal  . Pneumonia 02/2015  . Itching     both feet, known has athlet's feet now  . Esophageal cancer ([place]) 04/03/2015  . Pulmonary nodules 07/09/2015    SURGICAL HISTORY: Past Surgical History  Procedure Laterality Date  . Other surgical history Left 1995    fatty tissue tumor right upper thigh  . Dental surgery N/A January 2016    "dentures and spurs"   . Tumor excision Left 1990    inner thigh  . Colonoscopy with propofol N/A 03/26/2015    RMR: colonic diverticulosis redundatn t colon. single colonic polyp removed as described above.   . Esophagogastroduodenoscopy (egd) with propofol N/A 03/26/2015    RMR: Bulky esophageal tumo status post biopsy 2cm hiatal hernia  . Biopsy  03/26/2015    Procedure: BIOPSY;  Surgeon: Daneil Dolin, MD;  Location: [facility];  Service: Endoscopy;;  esophageal mass  . Polypectomy  03/26/2015    Procedure: POLYPECTOMY;  Surgeon: Daneil Dolin, MD;  Location: [facility];  Service: Endoscopy;;  polypectomy at ileocecal valve    SOCIAL HISTORY: Social History   Social History  . Marital Status: Married    Spouse Name: N/A  . Number of Children: N/A  . Years of Education: N/A   Occupational History  . Not on file.   Social History Main Topics  . Smoking status: Former Smoker -- 1.50 packs/day for 47 years    Types: Cigarettes    Start date: 12/03/1966    Quit date: 12/08/2013  . Smokeless tobacco: Never Used  . Alcohol Use: No     Comment: Quit 2006: previously drank heavily. Had 1 drink 2-3 years ago (2013-2014)  . Drug Use: No     Comment: Previous IV drug use as teenager, none in 40 years. Last marijuanna use 02/22/15.  Marland Kitchen Sexual Activity: Yes   Other Topics Concern  . Not on file   Social History Narrative    Married 33 years, 2 children, 15 and 67. No grandchildren. He's quit smoking for about almost 16 months. History of IVDA Has had problems in the past with alcohol, but quit many years ago. Occupationally he says he's done just about everything  Concrete work, cooking, Warehouse manager, roofing, cleaned pools; "a lot of everything." From [place], [place]  FAMILY HISTORY: Family History  Problem Relation Age of Onset  . Pancreatic cancer Mother   . Lung cancer Father   . Colon cancer Neg Hx    indicated that his mother is deceased. He indicated that his father is deceased.    Mother and father deceased Mother was around 57, father around 74 Father died of lung cancer; his father was a smoker Mother died of pancreatic cancer 1 brother dead from being shot in the head Older brother hasn't been seen or heard from in 10-12 years  ALLERGIES:  is allergic to demerol.  MEDICATIONS:  Current Outpatient Prescriptions  Medication Sig Dispense Refill  . ACCU-CHEK AVIVA PLUS test strip 2 (two) times daily. use for testing  2  . acetaminophen (TYLENOL) 500 MG tablet Take 500 mg by mouth every 6 (six) hours as needed for fever.    Marland Kitchen albuterol (PROVENTIL HFA;VENTOLIN HFA) 108 (90 BASE) MCG/ACT inhaler Inhale 1-2 puffs into the lungs every 6 (six) hours as needed for wheezing or shortness of breath.     . ALPRAZolam (XANAX) 1 MG tablet Take 1 tablet (1 mg total) by mouth every 8 (eight) hours as needed for anxiety. 90 tablet 1  . amoxicillin-clavulanate (AUGMENTIN) 875-125 MG tablet Take 1 tablet by mouth every 12 (twelve) hours. 7 tablet 0  . docusate sodium (COLACE) 100 MG capsule Take 100 mg by mouth 2 (two) times daily.  4  . doxycycline (VIBRA-TABS) 100 MG tablet Take 1 tablet (100 mg total) by mouth every 12 (twelve) hours. 7 tablet 0  . FLUoxetine (PROZAC) 20 MG capsule Take 1 capsule (20 mg total) by mouth daily. 30 capsule 3  . HUMULIN R 100 UNIT/ML injection Inject 2-12 Units into the skin  2 (two) times daily as needed for high blood sugar. 151-200 = 2 units, 201-250 = 4 units, 251-300 = 6 units, 301-350 = 8 units, 351-400 = 10 units, 401-450 = 12 units.  2  . ipratropium-albuterol (DUONEB) 0.5-2.5 (3) MG/3ML SOLN INHALE CONTENTS OF 1 VIAL IN NEBULIZER EVERY 4 HOURS AS NEEDED FOR SHORTNESS OF BREATH.  3  . Ledipasvir-Sofosbuvir (HARVONI) 90-400 MG TABS Take 1 tablet by mouth daily.     Marland Kitchen levothyroxine (SYNTHROID, LEVOTHROID) 25 MCG tablet Take 25 mcg by mouth daily.  5  . lidocaine-prilocaine (EMLA) cream Apply a quarter size amount to port site 1 hour prior to chemo. Do not rub in. Cover with plastic wrap. 30 g 3  . meloxicam (MOBIC) 7.5 MG tablet TAKE ONE TABLET BY MOUTH ONCE DAILY  6  . metFORMIN (GLUCOPHAGE) 500 MG tablet Take 500 mg by mouth 2 (two) times daily.  3  . ondansetron (ZOFRAN) 8 MG tablet TAKE 1 TABLET (8 MG TOTAL) BY MOUTH EVERY 8 (EIGHT) HOURS AS NEEDED FOR NAUSEA OR VOMITING. 30 tablet 2  . oxyCODONE (OXYCONTIN) 20 mg 12 hr tablet Take 1 tablet (20 mg total) by mouth every 8 (eight) hours. 90 tablet 0  . oxyCODONE-acetaminophen (ROXICET) 5-325 MG tablet Take 1-2 tablets by mouth every 6 (six) hours as needed for moderate pain or severe pain. 60 tablet 0  . prochlorperazine (COMPAZINE) 10 MG tablet TAKE 1 TABLET (10 MG TOTAL) BY MOUTH EVERY 6 (SIX) HOURS AS NEEDED FOR NAUSEA OR VOMITING. 30 tablet 2  . SPIRIVA HANDIHALER 18 MCG inhalation capsule Place 1 puff into inhaler and inhale daily.  5  . sucralfate (CARAFATE) 1 g tablet Take 1 tablet by mouth 2 (two) times daily as needed (throat pain).   5  . tetrahydrozoline 0.05 % ophthalmic solution Place 1 drop into both eyes daily as needed (dry eyes).    Marland Kitchen lidocaine (XYLOCAINE) 2 % solution Reported on 07/24/2015  3  . nitroGLYCERIN (NITROSTAT) 0.4 MG SL tablet Place 1 tablet (0.4 mg total) under the tongue every 5 (five) minutes as needed for chest pain. (Patient not taking: Reported on 07/24/2015) 25 tablet 3   No  current facility-administered medications for this visit.   Facility-Administered Medications Ordered in Other Visits  Medication Dose Route  Frequency Provider Last Rate Last Dose  . heparin lock flush 100 unit/mL  500 Units Intravenous Once Baird Cancer, PA-C      . sodium chloride flush (NS) 0.9 % injection 20 mL  20 mL Intravenous PRN Baird Cancer, PA-C        Review of Systems  Constitutional: Positive for malaise/fatigue. Negative for fever, chills and weight loss.  HENT: Negative for congestion, hearing loss, nosebleeds and tinnitus.   Eyes: Negative.  Negative for blurred vision, double vision, pain and discharge.  Respiratory: Negative.  Negative for cough, hemoptysis, sputum production, shortness of breath and wheezing.   Cardiovascular: Negative for palpitations, claudication, leg swelling and PND.  Gastrointestinal: Negative for heartburn, nausea, vomiting, diarrhea, constipation, blood in stool and melena.  Genitourinary: Negative.  Negative for dysuria, urgency, frequency and hematuria.  Musculoskeletal: Positive for joint pain. Negative for myalgias and falls.  Skin: Negative.  Negative for itching and rash.  Neurological: Negative for dizziness, tingling, tremors, sensory change, speech change, focal weakness, seizures, loss of consciousness and headaches.  Endo/Heme/Allergies: Negative.  Does not bruise/bleed easily.  Psychiatric/Behavioral: Positive for depression. Negative for suicidal ideas, memory loss and substance abuse. The patient is nervous/anxious.   All other systems reviewed and are negative.  14 point ROS was done and is otherwise as detailed above or in HPI   PHYSICAL EXAMINATION: ECOG PERFORMANCE STATUS: 1 - Symptomatic but completely ambulatory  Vitals - 1 value per visit 7/42/5956  SYSTOLIC 387  DIASTOLIC 83  Pulse 97  Temperature 97.5  Respirations 16  Weight (lb) 159.2  Height   BMI 21.01  VISIT REPORT    Physical Exam    Constitutional: He is oriented to person, place, and time and well-developed, well-nourished, and in no distress.  Thin. Wears glasses. Has nasal cannula in place for oxygen.  HENT:  Head: Normocephalic and atraumatic.  Nose: Nose normal.  Mouth/Throat: Oropharynx is clear and moist. He has dentures. No oropharyngeal exudate.  Dentures on top and bottom.  Eyes: Conjunctivae and EOM are normal. Pupils are equal, round, and reactive to light. Right eye exhibits no discharge. Left eye exhibits no discharge. No scleral icterus.  Neck: Normal range of motion. Neck supple. No tracheal deviation present. No thyromegaly present.  Cardiovascular: Normal rate, regular rhythm and normal heart sounds.  Exam reveals no gallop and no friction rub.   No murmur heard. Pulmonary/Chest: Effort normal and breath sounds normal. He has no wheezes. He has no rales.  Abdominal: Soft. Bowel sounds are normal. He exhibits no distension and no mass. There is no tenderness. There is no rebound and no guarding.  Musculoskeletal: Normal range of motion. He exhibits no edema.  Lymphadenopathy:    He has no cervical adenopathy.  Neurological: He is alert and oriented to person, place, and time. He has normal reflexes. No cranial nerve deficit. Gait normal. Coordination normal.  Skin: Skin is warm and dry. No rash noted.  Psychiatric: Mood, memory, affect and judgment normal.  Nursing note and vitals reviewed.   LABORATORY DATA:  I have reviewed the data as listed Results for ALBERT, HERSCH (MRN [id number]) as of 07/24/2015 16:35  Ref. Range 07/18/2015 06:15 07/20/2015 05:23  Sodium Latest Ref Range: 135-145 mmol/L 138 137  Potassium Latest Ref Range: 3.5-5.1 mmol/L 4.3 3.9  Chloride Latest Ref Range: 101-111 mmol/L 102 101  CO2 Latest Ref Range: 22-32 mmol/L 29 28  BUN Latest Ref Range: 6-20 mg/dL 5 (L) 6  Creatinine Latest  Ref Range: 0.61-1.24 mg/dL 0.66 0.63  Calcium Latest Ref Range: 8.9-10.3 mg/dL 8.5 (L) 8.6 (L)   EGFR (Non-African Amer.) Latest Ref Range: >60 mL/min >60 >60  EGFR (African American) Latest Ref Range: >60 mL/min >60 >60  Glucose Latest Ref Range: 65-99 mg/dL 109 (H) 93  Anion gap Latest Ref Range: 5-15  7 8   WBC Latest Ref Range: 4.0-10.5 K/uL 5.7   RBC Latest Ref Range: 4.22-5.81 MIL/uL 3.64 (L)   Hemoglobin Latest Ref Range: 13.0-17.0 g/dL 11.2 (L)   HCT Latest Ref Range: 39.0-52.0 % 33.6 (L)   MCV Latest Ref Range: 78.0-100.0 fL 92.3   MCH Latest Ref Range: 26.0-34.0 pg 30.8   MCHC Latest Ref Range: 30.0-36.0 g/dL 33.3   RDW Latest Ref Range: 11.5-15.5 % 14.6   Platelets Latest Ref Range: 150-400 K/uL 218   Vancomycin Tr Latest Ref Range: 10.0-20.0 ug/mL 18       PATHOLOGY:       RADIOGRAPHIC STUDIES: I have personally reviewed the radiological images as listed and agreed with the findings in the report. Study Result     CLINICAL DATA: Esophageal cancer. Possible liver lesion on PET.  EXAM: MRI ABDOMEN WITHOUT AND WITH CONTRAST  TECHNIQUE: Multiplanar multisequence MR imaging of the abdomen was performed both before and after the administration of intravenous contrast.  CONTRAST: 67m MULTIHANCE GADOBENATE DIMEGLUMINE 529 MG/ML IV SOLN  COMPARISON: PET-CT dated 04/05/2015.  FINDINGS: Lower chest: 2.0 cm patchy/nodular opacity in the posterior right lower lobe (series 6/image 2), new, likely reflecting atelectasis or possibly infection.  Lower esophageal wall thickening, likely corresponding to known primary esophageal neoplasm.  Hepatobiliary: 1.5 cm T2 hyperintense lesion in segment 7 (series 27/image 13), with vague enhancement, and corresponding to the hypermetabolic lesion on PET, suspicious for metastasis.  Two additional T2 hyperintense lesions measuring up to 5 mm may reflect cysts (series 6/ images 9 and 20).  Layering gallstones (series 6/ image 22), without associated inflammatory changes. No intrahepatic or extrahepatic  ductal dilatation.  Pancreas: Within normal limits.  Spleen: Within normal limits.  Adrenals/Urinary Tract: Adrenal glands are within normal limits.  Kidneys are within normal limits. No hydronephrosis.  Stomach/Bowel: Stomach is within normal limits.  Visualized bowel is unremarkable.  Vascular/Lymphatic: No evidence of abdominal aortic aneurysm.  Small upper abdominal lymph nodes, including:  --13 mm short axis gastrohepatic node (series 6/image 17), suspicious  --11 mm short axis node in the porta hepatis (series 6/ image 21), indeterminate  Other: No abdominal ascites.  Musculoskeletal: No focal osseous lesions.  IMPRESSION: Lower esophageal wall thickening, likely corresponding to known primary esophageal neoplasm.  1.5 cm hepatic lesion in segment 7, corresponding to the hypermetabolic lesion on CT, suspicious for metastasis.  Two upper abdominal lymph nodes measuring up to 1.3 cm short axis, suspicious for nodal metastases.  Cholelithiasis.   Electronically Signed  By: SJulian HyM.D.  On: 04/30/2015 17:58    ASSESSMENT & PLAN:  Poorly differentiated esophageal carcinoma, distal esophagus HX IVDA, Alcohol use Hepatitis C Dysphagia COPD Anemia  Liver lesion, questionable metastases Pneumonia, recurrent.  I am concerned about recent imaging studies. He has PET/CT imaging ordered for June 5th.   He has had frequent pneumonia and I have recommended a speech pathology referral for evaluation. ? Aspiration.   He continues to follow with GI for hepatitis C, he is currently on harvoni.   We will look into helping him find a new PCP after June 15th.  I still want to see him  back before we obtain imaging, to check and make sure how he's doing. He will return in one week.   I have refilled requested medications and have advised his wife to keep track of how many short acting pain pills he is using daily. I explained that this  will let us adjust the dose of his long acting pain medication.  All questions were answered. The patient knows to call the clinic with any problems, questions or concerns.  This document serves as a record of services personally performed by Ancil Linsey, MD. It was created on her behalf by Toni Amend, a trained medical scribe. The creation of this record is based on the scribe's personal observations and the provider's statements to them. This document has been checked and approved by the attending provider.  I have reviewed the above documentation for accuracy and completeness, and I agree with the above.  This note was electronically signed.  Toni Amend  07/24/2015 11:07 AM

## 2015-07-24 NOTE — Progress Notes (Signed)
Referral sent to Speech paper referral.  Faxed on 07/24/15

## 2015-07-24 NOTE — Progress Notes (Signed)
Patient didn't need port flush today with labs because he was inpatient and had the labs drawn as well as having his port flushed, per Dr.  Muse.  Message sent to Amy to schedule next port flush in 8 weeks per protocol.  Patient and wife aware.

## 2015-07-25 ENCOUNTER — Telehealth (HOSPITAL_COMMUNITY): Payer: Self-pay | Admitting: *Deleted

## 2015-07-25 ENCOUNTER — Ambulatory Visit (INDEPENDENT_AMBULATORY_CARE_PROVIDER_SITE_OTHER): Payer: Medicaid Other | Admitting: Nurse Practitioner

## 2015-07-25 ENCOUNTER — Encounter: Payer: Self-pay | Admitting: Nurse Practitioner

## 2015-07-25 VITALS — BP 123/82 | HR 94 | Temp 98.4°F | Ht 68.0 in | Wt 158.2 lb

## 2015-07-25 DIAGNOSIS — B1710 Acute hepatitis C without hepatic coma: Secondary | ICD-10-CM | POA: Diagnosis not present

## 2015-07-25 LAB — CBC WITH DIFFERENTIAL/PLATELET
BASOS ABS: 0 {cells}/uL (ref 0–200)
Basophils Relative: 0 %
EOS ABS: 402 {cells}/uL (ref 15–500)
EOS PCT: 6 %
HEMATOCRIT: 39.7 % (ref 38.5–50.0)
HEMOGLOBIN: 13.3 g/dL (ref 13.2–17.1)
LYMPHS ABS: 938 {cells}/uL (ref 850–3900)
Lymphocytes Relative: 14 %
MCH: 30.5 pg (ref 27.0–33.0)
MCHC: 33.5 g/dL (ref 32.0–36.0)
MCV: 91.1 fL (ref 80.0–100.0)
MPV: 11.9 fL (ref 7.5–12.5)
Monocytes Absolute: 1005 cells/uL — ABNORMAL HIGH (ref 200–950)
Monocytes Relative: 15 %
NEUTROS ABS: 4355 {cells}/uL (ref 1500–7800)
NEUTROS PCT: 65 %
Platelets: 403 10*3/uL — ABNORMAL HIGH (ref 140–400)
RBC: 4.36 MIL/uL (ref 4.20–5.80)
RDW: 14.9 % (ref 11.0–15.0)
WBC: 6.7 10*3/uL (ref 3.8–10.8)

## 2015-07-25 LAB — COMPREHENSIVE METABOLIC PANEL
ALBUMIN: 3.6 g/dL (ref 3.6–5.1)
ALT: 13 U/L (ref 9–46)
AST: 32 U/L (ref 10–35)
Alkaline Phosphatase: 109 U/L (ref 40–115)
BUN: 10 mg/dL (ref 7–25)
CALCIUM: 9.5 mg/dL (ref 8.6–10.3)
CHLORIDE: 100 mmol/L (ref 98–110)
CO2: 29 mmol/L (ref 20–31)
CREATININE: 1.17 mg/dL (ref 0.70–1.25)
Glucose, Bld: 105 mg/dL — ABNORMAL HIGH (ref 65–99)
POTASSIUM: 4.2 mmol/L (ref 3.5–5.3)
Sodium: 140 mmol/L (ref 135–146)
TOTAL PROTEIN: 7.7 g/dL (ref 6.1–8.1)
Total Bilirubin: 0.4 mg/dL (ref 0.2–1.2)

## 2015-07-25 NOTE — Patient Instructions (Signed)
1. Have your labs drawn when you're able to. 2. Return for follow-up in 3 months.

## 2015-07-25 NOTE — Progress Notes (Signed)
Referring Provider: [name], NP Primary Care Physician:  [name], NP Primary GI:  Dr. Gala Romney  Chief Complaint  Patient presents with  . Follow-up    HPI:   Brent Franco is a 65 y.o. male who presents For follow-up on chronic hepatitis C and liver lesion. He was last seen in our office for 07/28/2015 at which point he stated he was doing okay, currently on a break from chemotherapy and is exceeding their expectations for chemotherapy tolerance. He finished radiation as well. No liver symptoms noted. No alcohol in the 2 prior years, no recreational drugs. It were completed for consideration of hepatitis C treatment with Harvoni. Liver lesion questionable metastasis versus HCC which stated that finding supported metastasis over [place]. Approval was obtained for hepatitis C treatment. Saw hematology/oncology on 07/04/2015 which noted he has completed therapy and realistically doing quite well, fairly stable, noted well-rounded diet and increased activity.   He was admitted to the hospital 07/07/2015 and discharged 07/09/2015 for healthcare associated pneumonia, sepsis, atypical chest pain, and others. He was readmitted on 07/16/2015 and discharged 07/21/2015 for healthcare associated pneumonia and acute respiratory failure with hypoxia. Follow-up visit with oncology 07/24/2015 for frequent pneumonia, ordered PET/CTs imaging for June 5, continues on harvoni. Recommended 1 week follow-up visit with oncology.  Today he states he's doing ok. Is taking it one day at at time. He is now on oxygen which he thinks is short-term while he recovers from pneumonia. Oncology expressed concern to him about recurrent pneumonia. He is taking Harvoni, has been on it for about 4-5 weeks. Taking it every day not on PPI as per instructions given. Denies hematochezia, melena, yellowing of skin/eyes. Has had darkened urine, dark yellow, improved with hospital hydration (likely dehydration effect). Is short of breath  related to pneumonia recurrence. Denies chest pain, dizziness, lightheadedness, syncope, near syncope. Denies any other upper or lower GI symptoms.  Past Medical History  Diagnosis Date  . COPD (chronic obstructive pulmonary disease) ([place])   . ETOH abuse     stopped 10 years ago  . Depression     suicide attempt 20 years ago  . Arthritis   . Asthma   . Viral hepatitis C   . Diabetes mellitus without complication ([place])   . Hypothyroidism   . Cancer ([place])     esophageal  . Pneumonia 02/2015  . Itching     both feet, known has athlet's feet now  . Esophageal cancer ([place]) 04/03/2015  . Pulmonary nodules 07/09/2015    Past Surgical History  Procedure Laterality Date  . Other surgical history Left 1995    fatty tissue tumor right upper thigh  . Dental surgery N/A January 2016    "dentures and spurs"   . Tumor excision Left 1990    inner thigh  . Colonoscopy with propofol N/A 03/26/2015    RMR: colonic diverticulosis redundatn t colon. single colonic polyp removed as described above.   . Esophagogastroduodenoscopy (egd) with propofol N/A 03/26/2015    RMR: Bulky esophageal tumo status post biopsy 2cm hiatal hernia  . Biopsy  03/26/2015    Procedure: BIOPSY;  Surgeon: Daneil Dolin, MD;  [facility];  Service: Endoscopy;;  esophageal mass  . Polypectomy  03/26/2015    Procedure: POLYPECTOMY;  Surgeon: Daneil Dolin, MD;  [facility];  Service: Endoscopy;;  polypectomy at ileocecal valve    Current Outpatient Prescriptions  Medication Sig Dispense Refill  . ACCU-CHEK AVIVA  PLUS test strip 2 (two) times daily. use for testing  2  . acetaminophen (TYLENOL) 500 MG tablet Take 500 mg by mouth every 6 (six) hours as needed for fever.    Marland Kitchen albuterol (PROVENTIL HFA;VENTOLIN HFA) 108 (90 BASE) MCG/ACT inhaler Inhale 1-2 puffs into the lungs every 6 (six) hours as needed for wheezing or shortness of breath.     . ALPRAZolam (XANAX) 1 MG tablet Take 1 tablet (1 mg total)  by mouth every 8 (eight) hours as needed for anxiety. 90 tablet 1  . docusate sodium (COLACE) 100 MG capsule Take 100 mg by mouth 2 (two) times daily.  4  . FLUoxetine (PROZAC) 20 MG capsule Take 1 capsule (20 mg total) by mouth daily. 30 capsule 3  . HUMULIN R 100 UNIT/ML injection Inject 2-12 Units into the skin 2 (two) times daily as needed for high blood sugar. 151-200 = 2 units, 201-250 = 4 units, 251-300 = 6 units, 301-350 = 8 units, 351-400 = 10 units, 401-450 = 12 units.  2  . ipratropium-albuterol (DUONEB) 0.5-2.5 (3) MG/3ML SOLN INHALE CONTENTS OF 1 VIAL IN NEBULIZER EVERY 4 HOURS AS NEEDED FOR SHORTNESS OF BREATH.  3  . Ledipasvir-Sofosbuvir (HARVONI) 90-400 MG TABS Take 1 tablet by mouth daily.     Marland Kitchen levothyroxine (SYNTHROID, LEVOTHROID) 25 MCG tablet Take 25 mcg by mouth daily.  5  . lidocaine (XYLOCAINE) 2 % solution Reported on 07/24/2015  3  . lidocaine-prilocaine (EMLA) cream Apply a quarter size amount to port site 1 hour prior to chemo. Do not rub in. Cover with plastic wrap. 30 g 3  . meloxicam (MOBIC) 7.5 MG tablet TAKE ONE TABLET BY MOUTH ONCE DAILY  6  . metFORMIN (GLUCOPHAGE) 500 MG tablet Take 500 mg by mouth 2 (two) times daily.  3  . ondansetron (ZOFRAN) 8 MG tablet TAKE 1 TABLET (8 MG TOTAL) BY MOUTH EVERY 8 (EIGHT) HOURS AS NEEDED FOR NAUSEA OR VOMITING. 30 tablet 2  . oxyCODONE (OXYCONTIN) 20 mg 12 hr tablet Take 1 tablet (20 mg total) by mouth every 8 (eight) hours. 90 tablet 0  . oxyCODONE-acetaminophen (PERCOCET) 10-325 MG tablet Take 1 tablet by mouth every 4 (four) hours as needed for pain. 90 tablet 0  . prochlorperazine (COMPAZINE) 10 MG tablet TAKE 1 TABLET (10 MG TOTAL) BY MOUTH EVERY 6 (SIX) HOURS AS NEEDED FOR NAUSEA OR VOMITING. 30 tablet 2  . SPIRIVA HANDIHALER 18 MCG inhalation capsule Place 1 puff into inhaler and inhale daily.  5  . sucralfate (CARAFATE) 1 g tablet Take 1 tablet by mouth 2 (two) times daily as needed (throat pain).   5  . tetrahydrozoline  0.05 % ophthalmic solution Place 1 drop into both eyes daily as needed (dry eyes).    . nitroGLYCERIN (NITROSTAT) 0.4 MG SL tablet Place 1 tablet (0.4 mg total) under the tongue every 5 (five) minutes as needed for chest pain. (Patient not taking: Reported on 07/24/2015) 25 tablet 3   No current facility-administered medications for this visit.    Allergies as of 07/25/2015 - Review Complete 07/25/2015  Allergen Reaction Noted  . Demerol [meperidine] Rash 09/12/2014    Family History  Problem Relation Age of Onset  . Pancreatic cancer Mother   . Lung cancer Father   . Colon cancer Neg Hx     Social History   Social History  . Marital Status: Married    Spouse Name: N/A  . Number of Children: N/A  .  Years of Education: N/A   Social History Main Topics  . Smoking status: Former Smoker -- 1.50 packs/day for 47 years    Types: Cigarettes    Start date: 12/03/1966    Quit date: 12/08/2013  . Smokeless tobacco: Never Used  . Alcohol Use: No     Comment: Quit 2006: previously drank heavily. Had 1 drink 2-3 years ago (2013-2014)  . Drug Use: No     Comment: Previous IV drug use as teenager, none in 40 years. Last marijuanna use 02/22/15.  Marland Kitchen Sexual Activity: Yes   Other Topics Concern  . None   Social History Narrative    Review of Systems: General: Negative for fever, chills. ENT: Negative for hoarseness, difficulty swallowing CV: Negative for chest pain, angina, palpitations, peripheral edema.  Respiratory: Persistent dyspnea, on O2. Conitinued cough, sputum. Feels like he's getting better. GI: See history of present illness. Endo: Admits weight loss due to hospitalization, is improving hsi diet at home.  Heme: Negative for bruising or bleeding.   Physical Exam: BP 123/82 mmHg  Pulse 94  Temp(Src) 98.4 F (36.9 C) (Oral)  Ht 5\' 8"  ([id number] m)  Wt [id number] lb 3.2 oz (71.759 kg)  BMI 24.06 kg/m2 General:   Alert and oriented. Pleasant and cooperative. Appears thin and  fatigued.  Eyes:  Without icterus, sclera clear and conjunctiva pink.  Ears:  Normal auditory acuity. Cardiovascular:  S1, S2 present without murmurs appreciated. Extremities without clubbing or edema. Respiratory:  Clear to auscultation bilaterally. No wheezes, rales, or rhonchi. No distress.  Gastrointestinal:  +BS, soft, non-tender and non-distended. No HSM noted. No guarding or rebound. Rectal:  Deferred  Neurologic:  Alert and oriented x4;  grossly normal neurologically. Psych:  Alert and cooperative. Normal mood and affect. Heme/Lymph/Immune: No excessive bruising noted.    07/25/2015 10:33 AM   Disclaimer: This note was dictated with voice recognition software. Similar sounding words can inadvertently be transcribed and may not be corrected upon review.

## 2015-07-25 NOTE — Progress Notes (Signed)
CC'ED TO PCP 

## 2015-07-25 NOTE — Addendum Note (Signed)
Addended by: Claudina Lick on: 07/25/2015 10:36 AM   Modules accepted: Orders

## 2015-07-25 NOTE — Assessment & Plan Note (Signed)
Hepatitis C positive, currently 4-5 weeks through treatment with Harvoni. He is taking his medication as prescribed. Doing well from a treatment perspective. Side effects all likely due to chemotherapy, radiation treatment, and recurrent pneumonia with hospitalization. Medicaid requires hepatitis C viral RNA to be drawn in order to improve the final 4 weeks of treatment. This point we'll check CBC, CMP, HCV RNA. Return for follow-up in 3 months. Notify us of any changes.

## 2015-07-26 LAB — HEPATITIS C RNA QUANTITATIVE: HCV Quantitative: NOT DETECTED IU/mL (ref <15)

## 2015-07-27 NOTE — Telephone Encounter (Signed)
Done.  In basket  TK

## 2015-07-31 NOTE — Progress Notes (Signed)
Brent Spry, NP [address] [place] [address]  Malignant neoplasm of lower third of esophagus (B[facility]) - Plan: CBC with Differential, Comprehensive metabolic panel, CBC with Differential, Comprehensive metabolic panel  Poor fluid intake - Plan: 0.9 %  sodium chloride infusion, DISCONTINUED: 0.9 %  sodium chloride infusion  CURRENT THERAPY: Observation with upcoming imaging in 1-2 weeks  INTERVAL HISTORY: Brent Franco 65 y.o. male returns for followup of poorly differentiated esophageal carcinoma (unable to identify squamous cell versus adenocarcinoma) with radiographic evidence of possible metastatic disease to liver and lymph nodes, with esophageal lesion beginning at 35 cm from incisors extending down to 39 cm; approximately 1 cm superior to GE junction.    Esophageal cancer ([facility])   03/08/2015 Imaging CT chest at [facility]- abnormal adenopathy (hilar and mediastinal)   03/26/2015 Procedure EGD by Dr. Gala Franco with abnormal findings and biopsy   03/26/2015 Pathology Results Esophagus, biopsy - INVASIVE POORLY DIFFERENTIATED CARCINOMA, SEE COMMENT. - EXTENSIVE ULCERATION WITH FUNGAL ORGANISMS.   04/05/2015 PET scan Elongated distal esophageal mass is markedly hypermetabolic. Small mediastinal lymph nodes are not hypermetabolic. Single 10 mm gastrohepatic ligament lymph node which is hypermetabolic. Suspect a right hepatic lobe metastatic lesion.   04/15/2015 - 04/21/2015 Hospital Admission [facility]   04/24/2015 - 05/31/2015 Radiation Therapy Dr. Isidore Moos.  45Gy in 25 fractions to distal esophagus and lymph nodes and 5.4 Gy in 3 fractions to esophagus and lymph node boost   04/30/2015 Imaging MRI abd- 1.5 cm hepatic lesion in segment 7, corresponding to the hypermetabolic lesion on CT, suspicious for metastasis. Two upper abdominal lymph nodes measuring up to 1.3 cm short axis, suspicious for nodal metastases.   04/30/2015 - 05/28/2015 Chemotherapy Weekly Carboplatin/Paclitaxel with XRT   07/02/2015  Miscellaneous Harvoni for hepatitis C   07/08/2015 Imaging Patchy bilateral pneumonia, negative pulm embolism. scattered pulm nodules 1 cm in the RUL, could be infectious but needs chest CT after treatment to exlude new pulmonary metastases. Suggestion of new liver lesion   07/16/2015 - 07/21/2015 Hospital Admission [facility], Acute respiratory failure with hypoxia, COPD    I personally reviewed and went over laboratory results with the patient.  The results are noted within this dictation.  Labs will be updated today based upon patient's complaints and clinical condition.  Chart reviewed.  Patient reports fatigue and tiredness.  He note some RUQ discomfort on palpation.  His wife interjects that Brent Franco has a poor oral intake of fluids at this time.  "I was going to call yesterday, but I knew he had an appointment today."  She notes that his urination has declined and he is only drinking 16 oz of H2O daily.    Review of Systems  Constitutional: Positive for malaise/fatigue. Negative for fever, chills and weight loss.  HENT: Negative.   Eyes: Negative.   Respiratory: Negative.  Negative for cough and sputum production.   Cardiovascular: Negative.   Gastrointestinal: Positive for abdominal pain.  Genitourinary: Negative.   Musculoskeletal: Negative.   Skin: Negative.   Neurological: Positive for weakness.  Endo/Heme/Allergies: Negative.   Psychiatric/Behavioral: Negative.     Past Medical History  Diagnosis Date  . COPD (chronic obstructive pulmonary disease) ([facility])   . ETOH abuse     stopped 10 years ago  . Depression     suicide attempt 20 years ago  . Arthritis   . Asthma   . Viral hepatitis C   . Diabetes mellitus without complication (Brent Franco)   .  Hypothyroidism   . Cancer (B[facility])     esophageal  . Pneumonia 02/2015  . Itching     both feet, known has athlet's feet now  . Esophageal cancer (Brent Franco) 04/03/2015  . Pulmonary nodules 07/09/2015    Past Surgical History  Procedure Laterality  Date  . Other surgical history Left 1995    fatty tissue tumor right upper thigh  . Dental surgery N/A January 2016    "dentures and spurs"   . Tumor excision Left 1990    inner thigh  . Colonoscopy with propofol N/A 03/26/2015    RMR: colonic diverticulosis redundatn t colon. single colonic polyp removed as described above.   . Esophagogastroduodenoscopy (egd) with propofol N/A 03/26/2015    RMR: Bulky esophageal tumo status post biopsy 2cm hiatal hernia  . Biopsy  03/26/2015    Procedure: BIOPSY;  Surgeon: [place] Dolin, MD;  Location: [facility];  Service: Endoscopy;;  esophageal mass  . Polypectomy  03/26/2015    Procedure: POLYPECTOMY;  Surgeon: Brent Dolin, MD;  Location: [facility];  Service: Endoscopy;;  polypectomy at ileocecal valve    Family History  Problem Relation Age of Onset  . Pancreatic cancer Mother   . Lung cancer Father   . Colon cancer Neg Hx     Social History   Social History  . Marital Status: Married    Spouse Name: N/A  . Number of Children: N/A  . Years of Education: N/A   Social History Main Topics  . Smoking status: Former Smoker -- 1.50 packs/day for 47 years    Types: Cigarettes    Start date: 12/03/1966    Quit date: 12/08/2013  . Smokeless tobacco: Never Used  . Alcohol Use: No     Comment: Quit 2006: previously drank heavily. Had 1 drink 2-3 years ago (2013-2014)  . Drug Use: No     Comment: Previous IV drug use as teenager, none in 40 years. Last marijuanna use 02/22/15.  Brent Franco Sexual Activity: Yes   Other Topics Concern  . None   Social History Narrative     PHYSICAL EXAMINATION  ECOG PERFORMANCE STATUS: 2 - Symptomatic, <50% confined to bed  Filed Vitals:   08/01/15 0918  BP: 100/67  Pulse: 100  Temp: 97.8 F (36.6 C)  Resp: 20    GENERAL:alert, no distress, cooperative and in wheelchair, [place] in place for O2 delivery, looking ill but not toxic, accompanied by his wife. SKIN: skin color, texture, turgor are normal,  no rashes or significant lesions HEAD: Normocephalic, No masses, lesions, tenderness or abnormalities EYES: normal, Conjunctiva are pink and non-injected EARS: External ears normal OROPHARYNX:lips, buccal mucosa, and tongue normal and mucous membranes are moist  NECK: supple, trachea midline LYMPH:  not examined BREAST:not examined LUNGS: clear to auscultation  HEART: regular rate & rhythm ABDOMEN:abdomen soft, normal bowel sounds and RUQ tenderness, minimal. BACK: Back symmetric, no curvature. EXTREMITIES:less then 2 second capillary refill, no joint deformities, effusion, or inflammation, no skin discoloration  NEURO: alert & oriented x 3 with fluent speech, no focal motor/sensory deficits, in wheelchair  LABORATORY DATA: CBC    Component Value Date/Time   WBC 6.9 08/01/2015 1015   RBC 3.98* 08/01/2015 1015   HGB 12.2* 08/01/2015 1015   HCT 37.9* 08/01/2015 1015   PLT 225 08/01/2015 1015   MCV 95.2 08/01/2015 1015   MCH 30.7 08/01/2015 1015   MCHC 32.2 08/01/2015 1015   RDW 14.0 08/01/2015 1015   LYMPHSABS  0.9 08/01/2015 1015   MONOABS 0.8 08/01/2015 1015   EOSABS 0.4 08/01/2015 1015   BASOSABS 0.0 08/01/2015 1015      Chemistry      Component Value Date/Time   NA 137 08/01/2015 1015   K 4.5 08/01/2015 1015   CL 97* 08/01/2015 1015   CO2 33* 08/01/2015 1015   BUN 12 08/01/2015 1015   CREATININE 0.99 08/01/2015 1015   CREATININE 1.17 07/25/2015 1114      Component Value Date/Time   CALCIUM 8.8* 08/01/2015 1015   ALKPHOS 124 08/01/2015 1015   AST 29 08/01/2015 1015   ALT 15* 08/01/2015 1015   BILITOT 0.5 08/01/2015 1015        PENDING LABS:   RADIOGRAPHIC STUDIES:  Dg Chest 2 View  07/16/2015  CLINICAL DATA:  Shortness breath with cough and congestion since yesterday. History of COPD and esophageal cancer. EXAM: CHEST  2 VIEW COMPARISON:  07/08/2013, 07/08/2015 and 07/07/2015 FINDINGS: The right jugular Port-A-Cath is stable and positioned at the superior  cavoatrial junction. There are increased densities at the left lung base concerning for airspace disease. Again noted are densities along the medial right lung base but there are increased densities along the right costophrenic angle. Few patchy densities along the periphery of the right upper lung may also be new or enlarged. Heart size is stable. The trachea is midline. No large pleural effusions. No suspicious bone findings. IMPRESSION: Increased densities at both lung bases, left side greater than right. Findings are concerning for pneumonia. Slightly increased disease in the right upper lung. Electronically Signed   By: Markus Daft M.D.   On: 07/16/2015 12:56   Dg Chest 2 View  07/09/2015  CLINICAL DATA:  Chest pain and sepsis EXAM: CHEST  2 VIEW COMPARISON:  4/20 9/7 knee, 07/07/1968 FINDINGS: Cardiac shadow is stable. Lungs are mildly hyperinflated. Persistent bilateral infiltrative changes are seen worse in the lower lobes bilaterally. There has been some improvement when compared with the prior CT. No other focal abnormality is noted. IMPRESSION: Persistent but mildly improved infiltrates bilaterally. Electronically Signed   By: Inez Catalina M.D.   On: 07/09/2015 12:40   Dg Chest 2 View  07/07/2015  CLINICAL DATA:  Chest pain. EXAM: CHEST  2 VIEW COMPARISON:  04/17/2015 FINDINGS: Right-sided porta catheter with tip at the upper cavoatrial junction. Airspace opacity seen previously is resolved. There is no edema, consolidation, effusion, or pneumothorax. Chronic hyperinflation. Normal heart size and mediastinal contours. IMPRESSION: No evidence of active disease. Electronically Signed   By: Monte Fantasia M.D.   On: 07/07/2015 21:01   Ct Angio Chest Pe W/cm &/or Wo Cm  07/08/2015  CLINICAL DATA:  Left chest pain. Esophageal cancer. Cough and fever. EXAM: CT ANGIOGRAPHY CHEST WITH CONTRAST TECHNIQUE: Multidetector CT imaging of the chest was performed using the standard protocol during bolus  administration of intravenous contrast. Multiplanar CT image reconstructions and MIPs were obtained to evaluate the vascular anatomy. CONTRAST:  Dose currently not available, reference EMR. COMPARISON:  03/08/2015 FINDINGS: THORACIC INLET/BODY WALL: Right-sided porta catheter with tip at the upper right atrium. MEDIASTINUM: Normal heart size. No pericardial effusion. No acute vascular abnormality, including pulmonary embolism. Aortic atherosclerosis without acute finding. Lower esophageal thickening correlating with known mass. No definitive malignant adenopathy. Evaluation of mediastinal lymph nodes is limited in the setting of pulmonary infection. LUNG WINDOWS: Airspace disease in the lower lobes and less extensive in the lingula and right middle lobe. There is patchy ground-glass opacity in  the apical lungs. Scattered small nodules, most suspicious is a rounded 1 cm nodule in the right upper lobe 6:30. UPPER ABDOMEN: Subtle low-density in the posterior right liver which is not in the same location as suspected metastasis on 04/30/2015 MRI. OSSEOUS: No acute fracture.  No suspicious lytic or blastic lesions. Review of the MIP images confirms the above findings. IMPRESSION: 1. Patchy bilateral pneumonia. 2. Negative for pulmonary embolism. 3. Scattered pulmonary nodules, up to 1 cm in the right upper lobe, could be infectious but needs chest CT after treatment to exclude new pulmonary metastases. At the same time, recommend updated abdominal imaging as there is suggestion of a new liver lesion. Electronically Signed   By: Monte Fantasia M.D.   On: 07/08/2015 01:02     PATHOLOGY:    ASSESSMENT AND PLAN:  Esophageal cancer ([place]) Poorly differentiated esophageal carcinoma (unable to identify squamous cell versus adenocarcinoma) with radiographic evidence of possible metastatic disease to liver and lymph nodes, with esophageal lesion beginning at 35 cm from incisors extending down to 39 cm; approximately 1 cm  superior to GE junction.  Complicated by Hepatitis C without treatment.currently ~ 5-6 weeks into Harvoni treatment.  Oncology history is updated.  PET scan is scheduled for 08/13/2015.  There is concern for liver involvement from his esophageal cancer and hopefully this upcoming PET scan can help in determining this.   Labs today: CBC diff, CMET.  He does not feel well.  He does not look well.  He looked much better following completion of chemotherapy.  He is in a wheelchair with O2 being delivered via Nasal cannula.  Vitals are stable, BUT systolic BP is a little below baseline.  Patient's wife reports decreased oral fluid intake, reporting only one 16 oz bottle of water per day.  Due to poor oral intake of fluids, I have recommended IV NS today and next week.  Orders are placed for 1 L of NS today and next week at 333 cc/hr.  I am concerned about his QOL and possible hepatic involvement of his malignancy.  He notes RUQ abdominal discomfort intermittently.  He is currently on Harvoni therapy but I do not think this treatment can explain his level of decline.  His biggest complaint is fatigue/tiredness.  Harvoni has an incidence of 10-18% fatigue.  Weight is down about 10 lbs over the past 6 weeks.  He has been referred to Speech Pathology on 07/24/2015.  He has consultation appointment with Genene Churn on 08/08/2015.  Return following PET imaging for follow-up.  He will return next week for IV fluids.  ADDENDUM: Nursing reports clinical improvement in patient at time of discharge following 1 L of NS.      ORDERS PLACED FOR THIS ENCOUNTER: Orders Placed This Encounter  Procedures  . CBC with Differential  . Comprehensive metabolic panel    MEDICATIONS PRESCRIBED THIS ENCOUNTER: No orders of the defined types were placed in this encounter.    THERAPY PLAN:  PET scan is upcoming for further evaluation/restaging of esophageal cancer.  All questions were answered. The patient knows to  call the clinic with any problems, questions or concerns. We can certainly see the patient much sooner if necessary.  Patient and plan discussed with Dr. Ancil Linsey and she is in agreement with the aforementioned.   This note is electronically signed by: Doy Mince 08/01/2015 5:16 PM

## 2015-07-31 NOTE — Assessment & Plan Note (Addendum)
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  Complicated by Hepatitis C without treatment.currently ~ 5-6 weeks into Harvoni treatment.  Oncology history is updated.  PET scan is scheduled for 08/13/2015.  There is concern for liver involvement from his esophageal cancer and hopefully this upcoming PET scan can help in determining this.   Labs today: CBC diff, CMET.  He does not feel well.  He does not look well.  He looked much better following completion of chemotherapy.  He is in a wheelchair with O2 being delivered via Nasal cannula.  Vitals are stable, BUT systolic BP is a little below baseline.  Patient's wife reports decreased oral fluid intake, reporting only one 16 oz bottle of water per day.  Due to poor oral intake of fluids, I have recommended IV NS today and next week.  Orders are placed for 1 L of NS today and next week at 333 cc/hr.  I am concerned about his QOL and possible hepatic involvement of his malignancy.  He notes RUQ abdominal discomfort intermittently.  He is currently on Harvoni therapy but I do not think this treatment can explain his level of decline.  His biggest complaint is fatigue/tiredness.  Harvoni has an incidence of 10-18% fatigue.  Weight is down about 10 lbs over the past 6 weeks.  He has been referred to Speech Pathology on 07/24/2015.  He has consultation appointment with Genene Churn on 08/08/2015.  Return following PET imaging for follow-up.  He will return next week for IV fluids.  ADDENDUM: Nursing reports clinical improvement in patient at time of discharge following 1 L of NS.

## 2015-08-01 ENCOUNTER — Encounter: Payer: Self-pay | Admitting: Nurse Practitioner

## 2015-08-01 ENCOUNTER — Encounter (HOSPITAL_COMMUNITY): Payer: Self-pay | Admitting: Oncology

## 2015-08-01 ENCOUNTER — Encounter (HOSPITAL_BASED_OUTPATIENT_CLINIC_OR_DEPARTMENT_OTHER): Payer: Medicaid Other

## 2015-08-01 ENCOUNTER — Encounter (HOSPITAL_BASED_OUTPATIENT_CLINIC_OR_DEPARTMENT_OTHER): Payer: Medicaid Other | Admitting: Oncology

## 2015-08-01 VITALS — BP 100/67 | HR 100 | Temp 97.8°F | Resp 20 | Wt 160.2 lb

## 2015-08-01 VITALS — BP 107/65 | HR 90 | Temp 97.6°F | Resp 16

## 2015-08-01 DIAGNOSIS — K7689 Other specified diseases of liver: Secondary | ICD-10-CM | POA: Diagnosis not present

## 2015-08-01 DIAGNOSIS — R638 Other symptoms and signs concerning food and fluid intake: Secondary | ICD-10-CM | POA: Diagnosis present

## 2015-08-01 DIAGNOSIS — R109 Unspecified abdominal pain: Secondary | ICD-10-CM | POA: Diagnosis not present

## 2015-08-01 DIAGNOSIS — C155 Malignant neoplasm of lower third of esophagus: Secondary | ICD-10-CM

## 2015-08-01 DIAGNOSIS — R740 Nonspecific elevation of levels of transaminase and lactic acid dehydrogenase [LDH]: Secondary | ICD-10-CM | POA: Diagnosis not present

## 2015-08-01 DIAGNOSIS — R531 Weakness: Secondary | ICD-10-CM | POA: Diagnosis not present

## 2015-08-01 DIAGNOSIS — R5383 Other fatigue: Secondary | ICD-10-CM | POA: Diagnosis not present

## 2015-08-01 DIAGNOSIS — Z95828 Presence of other vascular implants and grafts: Secondary | ICD-10-CM

## 2015-08-01 DIAGNOSIS — K229 Disease of esophagus, unspecified: Secondary | ICD-10-CM | POA: Diagnosis not present

## 2015-08-01 LAB — CBC WITH DIFFERENTIAL/PLATELET
BASOS PCT: 0 %
Basophils Absolute: 0 10*3/uL (ref 0.0–0.1)
EOS ABS: 0.4 10*3/uL (ref 0.0–0.7)
Eosinophils Relative: 5 %
HCT: 37.9 % — ABNORMAL LOW (ref 39.0–52.0)
Hemoglobin: 12.2 g/dL — ABNORMAL LOW (ref 13.0–17.0)
Lymphocytes Relative: 14 %
Lymphs Abs: 0.9 10*3/uL (ref 0.7–4.0)
MCH: 30.7 pg (ref 26.0–34.0)
MCHC: 32.2 g/dL (ref 30.0–36.0)
MCV: 95.2 fL (ref 78.0–100.0)
MONO ABS: 0.8 10*3/uL (ref 0.1–1.0)
MONOS PCT: 11 %
Neutro Abs: 4.8 10*3/uL (ref 1.7–7.7)
Neutrophils Relative %: 70 %
Platelets: 225 10*3/uL (ref 150–400)
RBC: 3.98 MIL/uL — ABNORMAL LOW (ref 4.22–5.81)
RDW: 14 % (ref 11.5–15.5)
WBC: 6.9 10*3/uL (ref 4.0–10.5)

## 2015-08-01 LAB — COMPREHENSIVE METABOLIC PANEL
ALBUMIN: 3.3 g/dL — AB (ref 3.5–5.0)
ALT: 15 U/L — ABNORMAL LOW (ref 17–63)
ANION GAP: 7 (ref 5–15)
AST: 29 U/L (ref 15–41)
Alkaline Phosphatase: 124 U/L (ref 38–126)
BILIRUBIN TOTAL: 0.5 mg/dL (ref 0.3–1.2)
BUN: 12 mg/dL (ref 6–20)
CO2: 33 mmol/L — AB (ref 22–32)
Calcium: 8.8 mg/dL — ABNORMAL LOW (ref 8.9–10.3)
Chloride: 97 mmol/L — ABNORMAL LOW (ref 101–111)
Creatinine, Ser: 0.99 mg/dL (ref 0.61–1.24)
GFR calc Af Amer: >60 mL/min (ref >60)
GFR calc non Af Amer: >60 mL/min (ref >60)
GLUCOSE: 108 mg/dL — AB (ref 65–99)
POTASSIUM: 4.5 mmol/L (ref 3.5–5.1)
SODIUM: 137 mmol/L (ref 135–145)
TOTAL PROTEIN: 7.5 g/dL (ref 6.5–8.1)

## 2015-08-01 MED ORDER — SODIUM CHLORIDE 0.9 % IV SOLN
INTRAVENOUS | Status: DC
  Administered 2015-08-01: 10:00:00 via INTRAVENOUS

## 2015-08-01 MED ORDER — SODIUM CHLORIDE 0.9% FLUSH
10.0000 mL | INTRAVENOUS | Status: DC | PRN
  Administered 2015-08-01: 10 mL via INTRAVENOUS
  Filled 2015-08-01: qty 10

## 2015-08-01 MED ORDER — HEPARIN SOD (PORK) LOCK FLUSH 100 UNIT/ML IV SOLN
500.0000 [IU] | Freq: Once | INTRAVENOUS | Status: AC
  Administered 2015-08-01: 500 [IU] via INTRAVENOUS

## 2015-08-01 NOTE — Progress Notes (Signed)
Quick Note:  Please notify patient his virus level is undetectable. While this is reassuring it is not definitive. The true measure of cure is undetectable virus 12 months after treatment. Please notify insurance so they can ship the remaining Harvoni for complete treatment. ______

## 2015-08-01 NOTE — Patient Instructions (Signed)
[place] at [facility] Discharge Instructions  RECOMMENDATIONS MADE BY THE CONSULTANT AND ANY TEST RESULTS WILL BE SENT TO YOUR REFERRING PHYSICIAN.  IV fluids today. Return as scheduled for fluids. Return as scheduled for port flushes and office visit.   Thank you for choosing [place] at [facility] to provide your oncology and hematology care.  To afford each patient quality time with our provider, please arrive at least 15 minutes before your scheduled appointment time.   Beginning January 23rd 2017 lab work for the [facility] will be done in the  Main lab at [facility] on 1st floor. If you have a lab appointment with the [facility] please come in thru the  Main Entrance and check in at the main information desk  You need to re-schedule your appointment should you arrive 10 or more minutes late.  We strive to give you quality time with our providers, and arriving late affects you and other patients whose appointments are after yours.  Also, if you no show three or more times for appointments you may be dismissed from the clinic at the providers discretion.     Again, thank you for choosing [facility].  Our hope is that these requests will decrease the amount of time that you wait before being seen by our physicians.       _____________________________________________________________  Should you have questions after your visit to [facility], please contact our office at [phone] between the hours of 8:30 a.m. and 4:30 p.m.  Voicemails left after 4:30 p.m. will not be returned until the following business day.  For prescription refill requests, have your pharmacy contact our office.         Resources For Cancer Patients and their Caregivers ? American Cancer Society: Can assist with transportation, wigs, general needs, runs Look Good Feel Better.        2701701169 ? Cancer Care: Provides  financial assistance, online support groups, medication/co-pay assistance.  1-800-813-HOPE 508-706-9519) ? [place] Assists [place] cancer patients and their families through emotional , educational and financial support.  202-039-3408 ? [place] DSS Where to apply for food stamps, Medicaid and utility assistance. (754) 266-9385 ? RCATS: Transportation to medical appointments. (270)110-7086 ? Social Security Administration: May apply for disability if have a Stage IV cancer. 812-859-9560 3864261225 ? [employer], Disability and Transit Services: Assists with nutrition, care and transit needs. [place] Support Programs: @10RELATIVEDAYS @ > Cancer Support Group  2nd Tuesday of the month 1pm-2pm, Journey Room  > Creative Journey  3rd Tuesday of the month 1130am-1pm, Journey Room  > Look Good Feel Better  1st Wednesday of the month 10am-12 noon, Journey Room (Call [place] to register [phone])

## 2015-08-01 NOTE — Patient Instructions (Addendum)
[place] at [facility] Discharge Instructions  RECOMMENDATIONS MADE BY THE CONSULTANT AND ANY TEST RESULTS WILL BE SENT TO YOUR REFERRING PHYSICIAN.  Labs today.  1 Liter NS @ 355ml//hr today  1 Liter NS @ 315ml/hr next week  PET on 6/5 - scheduled  Speech Path evaluation on 5/31 - scheduled  Return after PET for results  Call in the meantime with any issues.    Thank you for choosing [place] at [facility] to provide your oncology and hematology care.  To afford each patient quality time with our provider, please arrive at least 15 minutes before your scheduled appointment time.   Beginning January 23rd 2017 lab work for the [facility] will be done in the  Main lab at [facility] on 1st floor. If you have a lab appointment with the [facility] please come in thru the  Main Entrance and check in at the main information desk  You need to re-schedule your appointment should you arrive 10 or more minutes late.  We strive to give you quality time with our providers, and arriving late affects you and other patients whose appointments are after yours.  Also, if you no show three or more times for appointments you may be dismissed from the clinic at the providers discretion.     Again, thank you for choosing [facility].  Our hope is that these requests will decrease the amount of time that you wait before being seen by our physicians.       _____________________________________________________________  Should you have questions after your visit to [facility], please contact our office at [phone] between the hours of 8:30 a.m. and 4:30 p.m.  Voicemails left after 4:30 p.m. will not be returned until the following business day.  For prescription refill requests, have your pharmacy contact our office.         Resources For Cancer Patients and their Caregivers ? American Cancer Society: Can assist  with transportation, wigs, general needs, runs Look Good Feel Better.        8146480327 ? Cancer Care: Provides financial assistance, online support groups, medication/co-pay assistance.  1-800-813-HOPE 306-205-0855) ? [place] Assists [place] cancer patients and their families through emotional , educational and financial support.  (873) 025-7237 ? [place] DSS Where to apply for food stamps, Medicaid and utility assistance. 8678350142 ? RCATS: Transportation to medical appointments. 706-553-3822 ? Social Security Administration: May apply for disability if have a Stage IV cancer. (774) 810-8068 5620452944 ? [employer], Disability and Transit Services: Assists with nutrition, care and transit needs. [place] Support Programs: @10RELATIVEDAYS @ > Cancer Support Group  2nd Tuesday of the month 1pm-2pm, Journey Room  > Creative Journey  3rd Tuesday of the month 1130am-1pm, Journey Room  > Look Good Feel Better  1st Wednesday of the month 10am-12 noon, Journey Room (Call [place] to register [phone])

## 2015-08-02 ENCOUNTER — Ambulatory Visit (HOSPITAL_COMMUNITY): Payer: Medicaid Other | Admitting: Hematology & Oncology

## 2015-08-02 ENCOUNTER — Other Ambulatory Visit (HOSPITAL_COMMUNITY): Payer: Medicaid Other

## 2015-08-08 ENCOUNTER — Ambulatory Visit (HOSPITAL_COMMUNITY): Payer: Medicaid Other | Attending: Hematology & Oncology | Admitting: Speech Pathology

## 2015-08-08 ENCOUNTER — Other Ambulatory Visit (HOSPITAL_COMMUNITY): Payer: Self-pay | Admitting: Oncology

## 2015-08-08 DIAGNOSIS — C155 Malignant neoplasm of lower third of esophagus: Secondary | ICD-10-CM

## 2015-08-08 DIAGNOSIS — R1312 Dysphagia, oropharyngeal phase: Secondary | ICD-10-CM | POA: Diagnosis not present

## 2015-08-08 NOTE — Therapy (Signed)
[place], [place], [address] Phone: [phone]   Fax:  [phone]  Speech Language Pathology Evaluation/Dysphagia  Patient Details  Name: Brent Franco MRN: [id number] Date of Birth: Jan 22, 1951 No Data Recorded  Encounter Date: 08/08/2015      End of Session - 08/08/15 1340    Visit Number 1   Number of Visits 4   Authorization Type Medicaid   SLP Start Time 0900   SLP Stop Time  0945   SLP Time Calculation (min) 45 min   Activity Tolerance Patient tolerated treatment well      Past Medical History  Diagnosis Date  . COPD (chronic obstructive pulmonary disease) ([place])   . ETOH abuse     stopped 10 years ago  . Depression     suicide attempt 20 years ago  . Arthritis   . Asthma   . Viral hepatitis C   . Diabetes mellitus without complication ([place])   . Hypothyroidism   . Cancer ([place])     esophageal  . Pneumonia 02/2015  . Itching     both feet, known has athlet's feet now  . Esophageal cancer ([place]) 04/03/2015  . Pulmonary nodules 07/09/2015    Past Surgical History  Procedure Laterality Date  . Other surgical history Left 1995    fatty tissue tumor right upper thigh  . Dental surgery N/A January 2016    "dentures and spurs"   . Tumor excision Left 1990    inner thigh  . Colonoscopy with propofol N/A 03/26/2015    RMR: colonic diverticulosis redundatn t colon. single colonic polyp removed as described above.   . Esophagogastroduodenoscopy (egd) with propofol N/A 03/26/2015    RMR: Bulky esophageal tumo status post biopsy 2cm hiatal hernia  . Biopsy  03/26/2015    Procedure: BIOPSY;  Surgeon: Daneil Dolin, MD;  Location: [facility];  Service: Endoscopy;;  esophageal mass  . Polypectomy  03/26/2015    Procedure: POLYPECTOMY;  Surgeon: Daneil Dolin, MD;  Location: [facility];  Service: Endoscopy;;  polypectomy at ileocecal valve    There were no vitals filed for this visit.       Subjective Assessment - 08/08/15 1337    Subjective "I feel pretty bad."   Patient is accompained by: Family member   Currently in Pain? No/denies           Prior Functional Status - 08/08/15 1337    Prior Functional Status   Cognitive/Linguistic Baseline Within functional limits   Type of Home House   Available Help at Discharge Family   Vocation On disability         General - 08/08/15 1337    General Information Onset: January 2017 HPI: Brent Franco 65 y.o. male returns for followup of poorly differentiated esophageal carcinoma (unable to identify squamous cell versus adenocarcinoma) with radiographic evidence of possible metastatic disease to liver and lymph nodes, with esophageal lesion beginning at 35 cm from incisors extending down to 39 cm; approximately 1 cm superior to GE junction. Complicated by Hepatitis C without treatment. Now currently ~ 5-6 weeks into Harvoni treatment. PET scan is scheduled for 08/13/2015.  There is concern for liver involvement from his esophageal cancer and hopefully this upcoming PET scan can help in determining this. Patient's wife reports decreased oral fluid intake, reporting only one 16 oz bottle of water per day.  Due to poor oral intake of fluids, pt receiving IV  NS. He was admitted to hospital for PNA 4 times since December per pt's wife [name] and [place]) and this is his first referral to SLP. Pt referred for clinical swallow evaluation by Dr. Whitney Muse and Kirby Crigler. He is followed by Dr. Isidore Moos for radiation oncology. Wife reports weight loss of ~9 pounds since diagnosis.    Type of Study Bedside Swallow Evaluation   Diet Prior to this Study Dysphagia 3 (soft);Thin liquids   Temperature Spikes Noted No   Respiratory Status Nasal cannula  3L   History of Recent Intubation No   Behavior/Cognition Alert;Cooperative;Pleasant mood   Oral Cavity Assessment Dry  tongue dry with some secretions   Oral Care Completed by SLP Yes   Oral  Cavity - Dentition Dentures, top;Dentures, bottom   Vision Functional for self-feeding   Self-Feeding Abilities Able to feed self   Patient Positioning Upright in chair   Baseline Vocal Quality Normal;Low vocal intensity   Volitional Cough Strong   Volitional Swallow Able to elicit          Oral Motor/Sensory Function - 08/08/15 1338    Oral Motor/Sensory Function   Overall Oral Motor/Sensory Function Moderate impairment   Facial ROM Within Functional Limits   Facial Symmetry Within Functional Limits   Facial Strength Within Functional Limits   Facial Sensation Within Functional Limits   Lingual ROM Within Functional Limits   Lingual Symmetry Within Functional Limits   Lingual Strength Reduced   Lingual Sensation Within Functional Limits   Velum Within Functional Limits   Mandible Within Functional Limits         Ice Chips - 08/08/15 1339    Ice Chips   Ice chips Not tested         Thin Liquid - 08/08/15 1339    Thin Liquid   Thin Liquid Impaired   Presentation Spoon;Cup;Self Fed   Pharyngeal  Phase Impairments Suspected delayed Swallow;Multiple swallows;Cough - Immediate         Nectar thick liquid - 08/08/15 1339    Nectar Thick Liquid   Nectar Thick Liquid Impaired   Presentation Self Fed;Cup   Pharyngeal Phase Impairments Suspected delayed Swallow;Multiple swallows         Honey Thick Liquid - 08/08/15 1339    Honey Thick Liquid   Honey Thick Liquid Not tested         Puree - 08/08/15 1340    Puree   Puree Within functional limits   Presentation Spoon;Self Fed         Solid - 08/08/15 1340    Solid   Solid Not tested           SLP Short Term Goals - 08/08/15 1342    SLP SHORT TERM GOAL #1   Title Pt will complete MBSS to assist with further treatment planning.   Period Weeks   Status New   SLP SHORT TERM GOAL #2   Title Pending; will request visits from [facility] after MBSS   Period Weeks   Status New            Plan -  08/08/15 1341    Clinical Impression Statement Pt presents with suspected pharyngeal phase dysphagia in setting of treatment for esophageal cancer (s/p radiation and chemo). Pt accompanied to today's appointment by his wife who assisted in providing background information. Pt very weak and with low energy. His wife reported some mild confusion from patient early this AM which she attributes to medication effect. Pt has  been consuming soft foods and unrestricted liquids at home, but has had difficulty meeting hydration needs so is going in for IV NS. Mrs. Kallmeyer reports adequate tolerance of applesauce, ground meats, oatmeal, ice cream, pudding, scrambled eggs, and strawberry Boost. Oral motor examination reveals dry oral cavity with white, thin, ropy secretions. Pt with reduced lingual strength, soft palate rises bilaterally, but reduced. Pt assessed with thin water, puree, and nectar-thickened water. Audible swallow elicited (? Decreased velopharyngeal closure) and pt swallows several times to clear. Pt with one episode of immediate cough when drinking from water bottle. Pt cued to take small cup sips with head in neutral position. Recommend objective assessment via MBSS to provided further insight into oropharyngeal phase with esophageal sweep, identify compensatory strategies, and recommend safest diet. Continue with soft diet and thin liquids for now with a focus on oral care before and after meals, small sips with repeat swallows to clear. Pt/family in agreement with plan of care. Pt has PET scheduled for this Monday at [facility], so will try to arrange MBSS for patient at that time if it can be accommodated.    Speech Therapy Frequency 1x /week   Duration 4 weeks   Treatment/Interventions Aspiration precaution training;Pharyngeal strengthening exercises;Diet toleration management by SLP;Compensatory techniques;SLP instruction and feedback;Compensatory strategies;Patient/family education  Recommend MBSS    Potential to Achieve Goals Fair   Potential Considerations Severity of impairments   SLP Home Exercise Plan Pt will be complete HEP as assigned to faciliate carryover of treatment strategies in home environment with use of written cue as needed.   Consulted and Agree with Plan of Care Patient;Family member/caregiver   Family Member Consulted Wife      Patient will benefit from skilled therapeutic intervention in order to improve the following deficits and impairments:   Dysphagia, oropharyngeal phase    Problem List Patient Active Problem List   Diagnosis Date Noted  . Malnutrition of moderate degree 07/20/2015  . Acute respiratory failure with hypoxia ([place]) 07/20/2015  . Lobar pneumonia ([place]) 07/16/2015  . Hypoxemia 07/16/2015  . Diabetes mellitus without complication, with long-term current use of insulin ([place]) 07/09/2015  . Pulmonary nodules 07/09/2015  . Pain in the chest   . Hepatitis C 06/13/2015  . Healthcare-associated pneumonia   . Pneumonia 04/15/2015  . Liver lesion 04/06/2015  . Esophageal cancer ([place]) 04/03/2015  . Mucosal abnormality of esophagus   . History of colonic polyps   . Diverticulosis of colon without hemorrhage   . Dysphagia 02/27/2015  . Encounter for screening colonoscopy 02/27/2015  . Elevated transaminase level 08/24/2014  . COPD (chronic obstructive pulmonary disease) ([place])   . Depression    Thank you,  Genene Churn, [place]  [facility] 08/08/2015, 1:43 PM  [place] [address] [place], [place], [address] Phone: [phone]   Fax:  [phone]  Name: Brent Franco MRN: [id number] Date of Birth: 10-03-1950

## 2015-08-09 ENCOUNTER — Telehealth (HOSPITAL_COMMUNITY): Payer: Self-pay | Admitting: Speech Pathology

## 2015-08-09 ENCOUNTER — Other Ambulatory Visit: Payer: Self-pay

## 2015-08-09 ENCOUNTER — Emergency Department (HOSPITAL_COMMUNITY): Payer: Medicaid Other

## 2015-08-09 ENCOUNTER — Encounter (HOSPITAL_COMMUNITY): Payer: Self-pay | Admitting: Emergency Medicine

## 2015-08-09 ENCOUNTER — Inpatient Hospital Stay (HOSPITAL_COMMUNITY)
Admission: EM | Admit: 2015-08-09 | Discharge: 2015-08-13 | DRG: 177 | Disposition: A | Discharge status: Home Health | Payer: Medicaid Other | Attending: Internal Medicine | Admitting: Internal Medicine

## 2015-08-09 ENCOUNTER — Encounter (HOSPITAL_COMMUNITY): Payer: Medicaid Other | Attending: Hematology & Oncology

## 2015-08-09 DIAGNOSIS — J449 Chronic obstructive pulmonary disease, unspecified: Secondary | ICD-10-CM | POA: Diagnosis present

## 2015-08-09 DIAGNOSIS — E872 Acidosis: Secondary | ICD-10-CM | POA: Diagnosis present

## 2015-08-09 DIAGNOSIS — R4182 Altered mental status, unspecified: Secondary | ICD-10-CM | POA: Diagnosis present

## 2015-08-09 DIAGNOSIS — Z800 Family history of malignant neoplasm of digestive organs: Secondary | ICD-10-CM

## 2015-08-09 DIAGNOSIS — C159 Malignant neoplasm of esophagus, unspecified: Secondary | ICD-10-CM | POA: Diagnosis present

## 2015-08-09 DIAGNOSIS — E119 Type 2 diabetes mellitus without complications: Secondary | ICD-10-CM | POA: Diagnosis present

## 2015-08-09 DIAGNOSIS — E440 Moderate protein-calorie malnutrition: Secondary | ICD-10-CM | POA: Diagnosis present

## 2015-08-09 DIAGNOSIS — R404 Transient alteration of awareness: Secondary | ICD-10-CM

## 2015-08-09 DIAGNOSIS — R638 Other symptoms and signs concerning food and fluid intake: Secondary | ICD-10-CM

## 2015-08-09 DIAGNOSIS — J441 Chronic obstructive pulmonary disease with (acute) exacerbation: Secondary | ICD-10-CM | POA: Diagnosis present

## 2015-08-09 DIAGNOSIS — J9621 Acute and chronic respiratory failure with hypoxia: Secondary | ICD-10-CM | POA: Diagnosis not present

## 2015-08-09 DIAGNOSIS — J45909 Unspecified asthma, uncomplicated: Secondary | ICD-10-CM | POA: Diagnosis present

## 2015-08-09 DIAGNOSIS — E039 Hypothyroidism, unspecified: Secondary | ICD-10-CM | POA: Diagnosis present

## 2015-08-09 DIAGNOSIS — F329 Major depressive disorder, single episode, unspecified: Secondary | ICD-10-CM | POA: Diagnosis present

## 2015-08-09 DIAGNOSIS — J189 Pneumonia, unspecified organism: Secondary | ICD-10-CM | POA: Diagnosis not present

## 2015-08-09 DIAGNOSIS — B182 Chronic viral hepatitis C: Secondary | ICD-10-CM | POA: Diagnosis present

## 2015-08-09 DIAGNOSIS — Z794 Long term (current) use of insulin: Secondary | ICD-10-CM

## 2015-08-09 DIAGNOSIS — R1310 Dysphagia, unspecified: Secondary | ICD-10-CM

## 2015-08-09 DIAGNOSIS — R0902 Hypoxemia: Secondary | ICD-10-CM

## 2015-08-09 DIAGNOSIS — G8929 Other chronic pain: Secondary | ICD-10-CM | POA: Diagnosis present

## 2015-08-09 DIAGNOSIS — J440 Chronic obstructive pulmonary disease with acute lower respiratory infection: Secondary | ICD-10-CM | POA: Diagnosis present

## 2015-08-09 DIAGNOSIS — F419 Anxiety disorder, unspecified: Secondary | ICD-10-CM | POA: Diagnosis present

## 2015-08-09 DIAGNOSIS — Z7951 Long term (current) use of inhaled steroids: Secondary | ICD-10-CM

## 2015-08-09 DIAGNOSIS — Z8701 Personal history of pneumonia (recurrent): Secondary | ICD-10-CM

## 2015-08-09 DIAGNOSIS — J690 Pneumonitis due to inhalation of food and vomit: Principal | ICD-10-CM | POA: Diagnosis present

## 2015-08-09 DIAGNOSIS — Z95828 Presence of other vascular implants and grafts: Secondary | ICD-10-CM

## 2015-08-09 DIAGNOSIS — I2510 Atherosclerotic heart disease of native coronary artery without angina pectoris: Secondary | ICD-10-CM | POA: Diagnosis present

## 2015-08-09 DIAGNOSIS — J9622 Acute and chronic respiratory failure with hypercapnia: Secondary | ICD-10-CM | POA: Diagnosis present

## 2015-08-09 DIAGNOSIS — K7689 Other specified diseases of liver: Secondary | ICD-10-CM | POA: Insufficient documentation

## 2015-08-09 DIAGNOSIS — D649 Anemia, unspecified: Secondary | ICD-10-CM | POA: Diagnosis present

## 2015-08-09 DIAGNOSIS — F32A Depression, unspecified: Secondary | ICD-10-CM | POA: Diagnosis present

## 2015-08-09 DIAGNOSIS — C155 Malignant neoplasm of lower third of esophagus: Secondary | ICD-10-CM | POA: Insufficient documentation

## 2015-08-09 DIAGNOSIS — Z801 Family history of malignant neoplasm of trachea, bronchus and lung: Secondary | ICD-10-CM

## 2015-08-09 DIAGNOSIS — Z9981 Dependence on supplemental oxygen: Secondary | ICD-10-CM

## 2015-08-09 DIAGNOSIS — Z6821 Body mass index (BMI) 21.0-21.9, adult: Secondary | ICD-10-CM

## 2015-08-09 DIAGNOSIS — Z87891 Personal history of nicotine dependence: Secondary | ICD-10-CM

## 2015-08-09 DIAGNOSIS — Z79899 Other long term (current) drug therapy: Secondary | ICD-10-CM

## 2015-08-09 DIAGNOSIS — R0689 Other abnormalities of breathing: Secondary | ICD-10-CM

## 2015-08-09 DIAGNOSIS — G9340 Encephalopathy, unspecified: Secondary | ICD-10-CM | POA: Diagnosis present

## 2015-08-09 DIAGNOSIS — K229 Disease of esophagus, unspecified: Secondary | ICD-10-CM | POA: Insufficient documentation

## 2015-08-09 DIAGNOSIS — Z915 Personal history of self-harm: Secondary | ICD-10-CM

## 2015-08-09 DIAGNOSIS — R740 Nonspecific elevation of levels of transaminase and lactic acid dehydrogenase [LDH]: Secondary | ICD-10-CM | POA: Insufficient documentation

## 2015-08-09 HISTORY — DX: Dependence on supplemental oxygen: Z99.81

## 2015-08-09 LAB — CBC WITH DIFFERENTIAL/PLATELET
BASOS ABS: 0 10*3/uL (ref 0.0–0.1)
BASOS PCT: 0 %
EOS ABS: 0 10*3/uL (ref 0.0–0.7)
EOS PCT: 0 %
HCT: 36.3 % — ABNORMAL LOW (ref 39.0–52.0)
HEMOGLOBIN: 11.4 g/dL — AB (ref 13.0–17.0)
Lymphocytes Relative: 7 %
Lymphs Abs: 0.8 10*3/uL (ref 0.7–4.0)
MCH: 30.6 pg (ref 26.0–34.0)
MCHC: 31.4 g/dL (ref 30.0–36.0)
MCV: 97.3 fL (ref 78.0–100.0)
Monocytes Absolute: 1.7 10*3/uL — ABNORMAL HIGH (ref 0.1–1.0)
Monocytes Relative: 13 %
NEUTROS PCT: 80 %
Neutro Abs: 9.9 10*3/uL — ABNORMAL HIGH (ref 1.7–7.7)
PLATELETS: 159 10*3/uL (ref 150–400)
RBC: 3.73 MIL/uL — AB (ref 4.22–5.81)
RDW: 13.9 % (ref 11.5–15.5)
WBC: 12.4 10*3/uL — AB (ref 4.0–10.5)

## 2015-08-09 LAB — RAPID URINE DRUG SCREEN, HOSP PERFORMED
Amphetamines: NOT DETECTED
BARBITURATES: NOT DETECTED
BENZODIAZEPINES: POSITIVE — AB
COCAINE: NOT DETECTED
Opiates: POSITIVE — AB
Tetrahydrocannabinol: NOT DETECTED

## 2015-08-09 LAB — COMPREHENSIVE METABOLIC PANEL
ALBUMIN: 3.1 g/dL — AB (ref 3.5–5.0)
ALT: 30 U/L (ref 17–63)
AST: 50 U/L — AB (ref 15–41)
Alkaline Phosphatase: 162 U/L — ABNORMAL HIGH (ref 38–126)
Anion gap: 7 (ref 5–15)
BUN: 17 mg/dL (ref 6–20)
CHLORIDE: 98 mmol/L — AB (ref 101–111)
CO2: 31 mmol/L (ref 22–32)
CREATININE: 1.16 mg/dL (ref 0.61–1.24)
Calcium: 8.5 mg/dL — ABNORMAL LOW (ref 8.9–10.3)
GFR calc Af Amer: >60 mL/min (ref >60)
GLUCOSE: 192 mg/dL — AB (ref 65–99)
POTASSIUM: 4.2 mmol/L (ref 3.5–5.1)
Sodium: 136 mmol/L (ref 135–145)
Total Bilirubin: 0.7 mg/dL (ref 0.3–1.2)
Total Protein: 7.6 g/dL (ref 6.5–8.1)

## 2015-08-09 LAB — BLOOD GAS, ARTERIAL
Acid-Base Excess: 5.9 mmol/L — ABNORMAL HIGH (ref 0.0–2.0)
Acid-Base Excess: 6.3 mmol/L — ABNORMAL HIGH (ref 0.0–2.0)
Bicarbonate: 28.8 mEq/L — ABNORMAL HIGH (ref 20.0–24.0)
Bicarbonate: 29.5 mEq/L — ABNORMAL HIGH (ref 20.0–24.0)
DRAWN BY: 277331
Delivery systems: POSITIVE
Drawn by: 234301
Expiratory PAP: 6
FIO2: 0.35
INSPIRATORY PAP: 12
O2 CONTENT: 2 L/min
O2 SAT: 92.8 %
O2 Saturation: 97.4 %
PCO2 ART: 58.3 mmHg — AB (ref 35.0–45.0)
PO2 ART: 67.2 mmHg — AB (ref 80.0–100.0)
Patient temperature: 37
RATE: 14 resp/min
pCO2 arterial: 51 mmHg — ABNORMAL HIGH (ref 35.0–45.0)
pH, Arterial: 7.349 — ABNORMAL LOW (ref 7.350–7.450)
pH, Arterial: 7.401 (ref 7.350–7.450)
pO2, Arterial: 90.4 mmHg (ref 80.0–100.0)

## 2015-08-09 LAB — ETHANOL

## 2015-08-09 LAB — SALICYLATE LEVEL

## 2015-08-09 LAB — URINALYSIS, ROUTINE W REFLEX MICROSCOPIC
BILIRUBIN URINE: NEGATIVE
Glucose, UA: NEGATIVE mg/dL
Hgb urine dipstick: NEGATIVE
KETONES UR: NEGATIVE mg/dL
LEUKOCYTES UA: NEGATIVE
NITRITE: NEGATIVE
PROTEIN: NEGATIVE mg/dL
Specific Gravity, Urine: 1.02 (ref 1.005–1.030)
pH: 5.5 (ref 5.0–8.0)

## 2015-08-09 LAB — LACTIC ACID, PLASMA
LACTIC ACID, VENOUS: 1.8 mmol/L (ref 0.5–2.0)
LACTIC ACID, VENOUS: 1.1 mmol/L (ref 0.5–2.0)

## 2015-08-09 LAB — TROPONIN I

## 2015-08-09 LAB — GLUCOSE, CAPILLARY
GLUCOSE-CAPILLARY: 141 mg/dL — AB (ref 65–99)
GLUCOSE-CAPILLARY: 105 mg/dL — AB (ref 65–99)

## 2015-08-09 LAB — TSH: TSH: 1.064 u[IU]/mL (ref 0.350–4.500)

## 2015-08-09 LAB — BRAIN NATRIURETIC PEPTIDE: B Natriuretic Peptide: 215 pg/mL — ABNORMAL HIGH (ref 0.0–100.0)

## 2015-08-09 LAB — ACETAMINOPHEN LEVEL: Acetaminophen (Tylenol), Serum: <10 ug/mL — ABNORMAL LOW (ref 10–30)

## 2015-08-09 LAB — AMMONIA: AMMONIA: 29 umol/L (ref 9–35)

## 2015-08-09 LAB — MRSA PCR SCREENING: MRSA BY PCR: NEGATIVE

## 2015-08-09 MED ORDER — SODIUM CHLORIDE 0.9 % IV SOLN
INTRAVENOUS | Status: DC
Start: 2015-08-09 — End: 2015-08-09
  Administered 2015-08-09: 18:00:00 via INTRAVENOUS

## 2015-08-09 MED ORDER — OXYCODONE-ACETAMINOPHEN 10-325 MG PO TABS: 1.0000 | ORAL_TABLET | ORAL | Status: DC | PRN

## 2015-08-09 MED ORDER — FLUOXETINE HCL 20 MG PO CAPS
20.0000 mg | ORAL_CAPSULE | Freq: Every day | ORAL | Status: DC
  Administered 2015-08-10 – 2015-08-12 (×3): 20 mg via ORAL
  Filled 2015-08-09 (×3): qty 1

## 2015-08-09 MED ORDER — SODIUM CHLORIDE 0.9 % IV SOLN
INTRAVENOUS | Status: AC
  Administered 2015-08-09: 17:00:00 via INTRAVENOUS

## 2015-08-09 MED ORDER — ALBUTEROL SULFATE (2.5 MG/3ML) 0.083% IN NEBU
2.5000 mg | INHALATION_SOLUTION | Freq: Once | RESPIRATORY_TRACT | Status: AC
  Administered 2015-08-09: 2.5 mg via RESPIRATORY_TRACT
  Filled 2015-08-09: qty 3

## 2015-08-09 MED ORDER — PROCHLORPERAZINE MALEATE 5 MG PO TABS: 10.0000 mg | ORAL_TABLET | Freq: Four times a day (QID) | ORAL | Status: DC | PRN

## 2015-08-09 MED ORDER — DOCUSATE SODIUM 100 MG PO CAPS
100.0000 mg | ORAL_CAPSULE | Freq: Two times a day (BID) | ORAL | Status: DC
  Administered 2015-08-10 – 2015-08-12 (×6): 100 mg via ORAL
  Filled 2015-08-09 (×6): qty 1

## 2015-08-09 MED ORDER — LORAZEPAM 0.5 MG PO TABS
0.5000 mg | ORAL_TABLET | Freq: Once | ORAL | Status: AC
  Administered 2015-08-09: 0.5 mg via ORAL
  Filled 2015-08-09: qty 1

## 2015-08-09 MED ORDER — SODIUM CHLORIDE 0.9 % IV SOLN: INTRAVENOUS | Status: DC

## 2015-08-09 MED ORDER — DEXTROSE 5 % IV SOLN
1.0000 g | Freq: Three times a day (TID) | INTRAVENOUS | Status: DC
  Administered 2015-08-09: 1 g via INTRAVENOUS

## 2015-08-09 MED ORDER — POLYVINYL ALCOHOL 1.4 % OP SOLN
1.0000 [drp] | Freq: Four times a day (QID) | OPHTHALMIC | Status: DC | PRN
  Filled 2015-08-09: qty 15

## 2015-08-09 MED ORDER — ONDANSETRON HCL 4 MG PO TABS: 8.0000 mg | ORAL_TABLET | Freq: Three times a day (TID) | ORAL | Status: DC | PRN

## 2015-08-09 MED ORDER — PIPERACILLIN-TAZOBACTAM 3.375 G IVPB
3.3750 g | Freq: Three times a day (TID) | INTRAVENOUS | Status: DC
  Administered 2015-08-09 – 2015-08-11 (×3): 3.375 g via INTRAVENOUS
  Filled 2015-08-09 (×3): qty 50

## 2015-08-09 MED ORDER — IPRATROPIUM-ALBUTEROL 0.5-2.5 (3) MG/3ML IN SOLN: 3.0000 mL | RESPIRATORY_TRACT | Status: DC | PRN

## 2015-08-09 MED ORDER — ACETAMINOPHEN 500 MG PO TABS
500.0000 mg | ORAL_TABLET | Freq: Four times a day (QID) | ORAL | Status: DC | PRN
  Administered 2015-08-13: 500 mg via ORAL
  Filled 2015-08-09: qty 1

## 2015-08-09 MED ORDER — ALPRAZOLAM 1 MG PO TABS
1.0000 mg | ORAL_TABLET | Freq: Three times a day (TID) | ORAL | Status: DC | PRN
  Administered 2015-08-11 – 2015-08-13 (×3): 1 mg via ORAL
  Filled 2015-08-09: qty 1
  Filled 2015-08-09: qty 2
  Filled 2015-08-09: qty 1

## 2015-08-09 MED ORDER — OXYCODONE HCL 5 MG PO TABS
5.0000 mg | ORAL_TABLET | ORAL | Status: DC | PRN
  Administered 2015-08-13: 5 mg via ORAL
  Filled 2015-08-09: qty 1

## 2015-08-09 MED ORDER — INSULIN ASPART 100 UNIT/ML ~~LOC~~ SOLN
0.0000 [IU] | Freq: Three times a day (TID) | SUBCUTANEOUS | Status: DC
  Administered 2015-08-09: 2 [IU] via SUBCUTANEOUS
  Administered 2015-08-10: 3 [IU] via SUBCUTANEOUS
  Administered 2015-08-10: 5 [IU] via SUBCUTANEOUS
  Administered 2015-08-11: 3 [IU] via SUBCUTANEOUS
  Administered 2015-08-11 – 2015-08-12 (×3): 5 [IU] via SUBCUTANEOUS
  Administered 2015-08-12: 3 [IU] via SUBCUTANEOUS
  Administered 2015-08-12: 5 [IU] via SUBCUTANEOUS

## 2015-08-09 MED ORDER — TIOTROPIUM BROMIDE MONOHYDRATE 18 MCG IN CAPS
18.0000 ug | ORAL_CAPSULE | Freq: Every day | RESPIRATORY_TRACT | Status: DC
  Filled 2015-08-09: qty 5

## 2015-08-09 MED ORDER — NITROGLYCERIN 0.4 MG SL SUBL: 0.4000 mg | SUBLINGUAL_TABLET | SUBLINGUAL | Status: DC | PRN

## 2015-08-09 MED ORDER — VANCOMYCIN HCL IN DEXTROSE 1-5 GM/200ML-% IV SOLN: 1000.0000 mg | Freq: Once | INTRAVENOUS | Status: DC

## 2015-08-09 MED ORDER — SODIUM CHLORIDE 0.9 % IV BOLUS (SEPSIS)
500.0000 mL | Freq: Once | INTRAVENOUS | Status: AC
  Administered 2015-08-09: 500 mL via INTRAVENOUS

## 2015-08-09 MED ORDER — SODIUM CHLORIDE 0.9% FLUSH: 10.0000 mL | INTRAVENOUS | Status: DC | PRN

## 2015-08-09 MED ORDER — LEDIPASVIR-SOFOSBUVIR 90-400 MG PO TABS
1.0000 | ORAL_TABLET | Freq: Every day | ORAL | Status: DC
  Filled 2015-08-09 (×4): qty 1

## 2015-08-09 MED ORDER — IPRATROPIUM-ALBUTEROL 0.5-2.5 (3) MG/3ML IN SOLN
3.0000 mL | Freq: Once | RESPIRATORY_TRACT | Status: AC
  Administered 2015-08-09: 3 mL via RESPIRATORY_TRACT
  Filled 2015-08-09: qty 3

## 2015-08-09 MED ORDER — ENOXAPARIN SODIUM 40 MG/0.4ML ~~LOC~~ SOLN
40.0000 mg | SUBCUTANEOUS | Status: DC
  Administered 2015-08-09 – 2015-08-12 (×4): 40 mg via SUBCUTANEOUS
  Filled 2015-08-09 (×4): qty 0.4

## 2015-08-09 MED ORDER — DEXTROSE 5 % IV SOLN
1.0000 g | Freq: Once | INTRAVENOUS | Status: DC
  Filled 2015-08-09: qty 1

## 2015-08-09 MED ORDER — VANCOMYCIN HCL IN DEXTROSE 1-5 GM/200ML-% IV SOLN
1000.0000 mg | Freq: Three times a day (TID) | INTRAVENOUS | Status: DC
  Administered 2015-08-09 – 2015-08-11 (×5): 1000 mg via INTRAVENOUS
  Filled 2015-08-09 (×5): qty 200

## 2015-08-09 MED ORDER — HEPARIN SOD (PORK) LOCK FLUSH 100 UNIT/ML IV SOLN: 500.0000 [IU] | Freq: Once | INTRAVENOUS | Status: DC

## 2015-08-09 MED ORDER — OXYCODONE HCL ER 20 MG PO T12A
20.0000 mg | EXTENDED_RELEASE_TABLET | Freq: Three times a day (TID) | ORAL | Status: DC
  Administered 2015-08-09 – 2015-08-12 (×10): 20 mg via ORAL
  Filled 2015-08-09 (×12): qty 1

## 2015-08-09 MED ORDER — OXYCODONE-ACETAMINOPHEN 5-325 MG PO TABS: 1.0000 | ORAL_TABLET | ORAL | Status: DC | PRN

## 2015-08-09 MED ORDER — LEVOTHYROXINE SODIUM 25 MCG PO TABS
25.0000 ug | ORAL_TABLET | Freq: Every day | ORAL | Status: DC
  Administered 2015-08-10 – 2015-08-12 (×3): 25 ug via ORAL
  Filled 2015-08-09 (×3): qty 1

## 2015-08-09 NOTE — Progress Notes (Addendum)
Pharmacy Antibiotic Note  Brent Franco is a 65 y.o. male admitted on 08/09/2015 with pneumonia.  Pharmacy has been consulted for [name] dosing.  Plan:  Vancomycin 1000mg  IV q8h (goal trough 15-20) Check trough at steady state Zosyn 3.375gm IV q8h, EID Monitor labs, renal fxn, progress and c/s [place] ABX when improved / appropriate.    Height: 6\' 1"  (185.4 cm) Weight: 160 lb (72.576 kg) IBW/kg (Calculated) : 79.9  Temp (24hrs), Avg:98 F (36.7 C), Min:98 F (36.7 C), Max:98 F (36.7 C)   Recent Labs Lab 08/09/15 1437 08/09/15 1442  WBC  --  12.4*  CREATININE  --  1.16  LATICACIDVEN 1.8  --     Estimated Creatinine Clearance: 66.9 mL/min (by C-G formula based on Cr of 1.16).    Allergies  Allergen Reactions  . Demerol [Meperidine] Rash   Antimicrobials this admission: Vancomycin 6/1 >>  Zosyn 6/1 >>   Dose adjustments this admission: n/a  Microbiology results: 6/1 BCx:  6/1 UCx:   6/1 Sputum:   6/1 MRSA PCR:   Thank you for allowing pharmacy to be a part of this patient's care.  Hart Robinsons A 08/09/2015 4:47 PM

## 2015-08-09 NOTE — H&P (Signed)
History and Physical    Brent Franco [id number] DOB: 08/27/1950 DOA: 08/09/2015  PCP: [name], NP   Patient coming from: Home, via cancer center  Chief Complaint: Altered mental status, cough, hypoxia  HPI: Brent Franco is a 65 y.o. 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 Patient has had multiple recent hospital admissions, including for HCAP 3 weeks ago, and has had significant decline in his overall health. He was seen today at the cancer center for regularly scheduled appointment, was found to be confused and saturating 86%, and was directed to the emergency department. Per report, his portable oxygen tank was empty on his 30-minute ride to the [facility], but his home concentrator has been working appropriately. Patient improved on vancomycin and Zosyn during the recent hospital admission for pneumonia and his cultures remain negative. He initially did okay from a respiratory standpoint upon returning home, but has had increased cough since last night, as well as confusion despite using this oxygen concentrator at home. He denies any chest pain, palpitations, headaches, focal numbness or weakness, or loss of coordination. He also denies abdominal pain, nausea, vomiting, or diarrhea. He denies any recent falls or trauma.  ED Course: Upon arrival to the ED, patient is found to be afebrile, saturating low 90s on 2.5 L/m supplemental oxygen, and with vitals otherwise stable. EKG demonstrates a sinus rhythm and chest x-ray is notable for persistent airspace consolidation in the left base, and to a lesser degree in the right base, consistent with persistent pneumonia. Chemistry panel features serum glucose of 192, albumin of 3.1, and mild elevation in AST to 50. CBC is  notable for a leukocytosis to 12,400 and a stable normocytic anemia with hemoglobin of 11.4. ABG demonstrates a mild respiratory acidosis with hypoxemia and the patient was placed on BiPAP in the emergency department. Patient's mental status improved significantly while still in the ED. He was treated with nebulizers, Ativan, and empiric cefepime and vancomycin. A 500 mL normal saline bolus was administered. Patient remained hemodynamically stable in the emergency department and will be admitted to the stepdown unit for ongoing evaluation and management of acute encephalopathy suspected secondary to hypercarbia in the setting of HCAP with underlying COPD and chronic hypoxic respiratory failure.  Review of Systems:  All other systems reviewed and apart from HPI, are negative.  Past Medical History  Diagnosis Date  . COPD (chronic obstructive pulmonary disease) ([place])   . ETOH abuse     stopped 10 years ago  . Depression     suicide attempt 20 years ago  . Arthritis   . Asthma   . Viral hepatitis C   . Diabetes mellitus without complication ([place])   . Hypothyroidism   . Cancer ([place])     esophageal  . Pneumonia 02/2015  . Itching     both feet, known has athlet's feet now  . Esophageal cancer ([place]) 04/03/2015  . Pulmonary nodules 07/09/2015  . On home O2     Past Surgical History  Procedure Laterality Date  . Other surgical history Left 1995    fatty tissue tumor right upper thigh  . Dental surgery N/A January 2016    "dentures and spurs"   . Tumor excision Left 1990    inner thigh  . Colonoscopy with propofol N/A 03/26/2015  RMR: colonic diverticulosis redundatn t colon. single colonic polyp removed as described above.   . Esophagogastroduodenoscopy (egd) with propofol N/A 03/26/2015    RMR: Bulky esophageal tumo status post biopsy 2cm hiatal hernia  . Biopsy  03/26/2015    Procedure: BIOPSY;  Surgeon: Daneil Dolin, MD;  Location: [facility];  Service: Endoscopy;;  esophageal  mass  . Polypectomy  03/26/2015    Procedure: POLYPECTOMY;  Surgeon: Daneil Dolin, MD;  Location: [facility];  Service: Endoscopy;;  polypectomy at ileocecal valve     reports that he quit smoking about 20 months ago. His smoking use included Cigarettes. He started smoking about 48 years ago. He has a 70.5 pack-year smoking history. He has never used smokeless tobacco. He reports that he does not drink alcohol or use illicit drugs.  Allergies  Allergen Reactions  . Demerol [Meperidine] Rash    Family History  Problem Relation Age of Onset  . Pancreatic cancer Mother   . Lung cancer Father   . Colon cancer Neg Hx      Prior to Admission medications   Medication Sig Start Date End Date Taking? Authorizing Provider  ACCU-CHEK AVIVA PLUS test strip 2 (two) times daily. use for testing 05/02/15  Yes Historical Provider, MD  acetaminophen (TYLENOL) 500 MG tablet Take 500 mg by mouth every 6 (six) hours as needed for fever.   Yes Historical Provider, MD  albuterol (PROVENTIL HFA;VENTOLIN HFA) 108 (90 BASE) MCG/ACT inhaler Inhale 1-2 puffs into the lungs every 6 (six) hours as needed for wheezing or shortness of breath.    Yes Historical Provider, MD  ALPRAZolam Duanne Moron) 1 MG tablet Take 1 tablet (1 mg total) by mouth every 8 (eight) hours as needed for anxiety. 07/24/15  Yes Patrici Ranks, MD  docusate sodium (COLACE) 100 MG capsule Take 100 mg by mouth 2 (two) times daily. 03/10/15  Yes Historical Provider, MD  FLUoxetine (PROZAC) 20 MG capsule Take 1 capsule (20 mg total) by mouth daily. 06/11/15  Yes Thomas S Kefalas, PA-C  HUMULIN R 100 UNIT/ML injection Inject 2-12 Units into the skin 2 (two) times daily as needed for high blood sugar. 151-200 = 2 units, 201-250 = 4 units, 251-300 = 6 units, 301-350 = 8 units, 351-400 = 10 units, 401-450 = 12 units. 03/13/15  Yes Historical Provider, MD  ipratropium-albuterol (DUONEB) 0.5-2.5 (3) MG/3ML SOLN INHALE CONTENTS OF 1 VIAL IN NEBULIZER EVERY 4  HOURS AS NEEDED FOR SHORTNESS OF BREATH. 03/10/15  Yes Historical Provider, MD  Ledipasvir-Sofosbuvir (HARVONI) 90-400 MG TABS Take 1 tablet by mouth daily.    Yes Historical Provider, MD  levothyroxine (SYNTHROID, LEVOTHROID) 25 MCG tablet Take 25 mcg by mouth daily. 06/27/15  Yes Historical Provider, MD  lidocaine (XYLOCAINE) 2 % solution Reported on 07/24/2015 05/22/15  Yes Historical Provider, MD  lidocaine-prilocaine (EMLA) cream Apply a quarter size amount to port site 1 hour prior to chemo. Do not rub in. Cover with plastic wrap. 03/30/15  Yes Patrici Ranks, MD  meloxicam (MOBIC) 7.5 MG tablet TAKE ONE TABLET BY MOUTH ONCE DAILY 05/01/15  Yes Historical Provider, MD  metFORMIN (GLUCOPHAGE) 500 MG tablet Take 500 mg by mouth 2 (two) times daily. 03/10/15  Yes Historical Provider, MD  nitroGLYCERIN (NITROSTAT) 0.4 MG SL tablet Place 1 tablet (0.4 mg total) under the tongue every 5 (five) minutes as needed for chest pain. 09/21/14  Yes Herminio Commons, MD  ondansetron (ZOFRAN) 8 MG tablet TAKE 1  TABLET (8 MG TOTAL) BY MOUTH EVERY 8 (EIGHT) HOURS AS NEEDED FOR NAUSEA OR VOMITING. 07/02/15  Yes Patrici Ranks, MD  oxyCODONE (OXYCONTIN) 20 mg 12 hr tablet Take 1 tablet (20 mg total) by mouth every 8 (eight) hours. 07/24/15  Yes Patrici Ranks, MD  oxyCODONE-acetaminophen (PERCOCET) 10-325 MG tablet Take 1 tablet by mouth every 4 (four) hours as needed for pain. 07/24/15  Yes Patrici Ranks, MD  prochlorperazine (COMPAZINE) 10 MG tablet TAKE 1 TABLET (10 MG TOTAL) BY MOUTH EVERY 6 (SIX) HOURS AS NEEDED FOR NAUSEA OR VOMITING. 07/02/15  Yes Patrici Ranks, MD  SPIRIVA HANDIHALER 18 MCG inhalation capsule Place 1 puff into inhaler and inhale daily. 12/04/14  Yes Historical Provider, MD  sucralfate (CARAFATE) 1 g tablet Take 1 tablet by mouth 2 (two) times daily as needed (throat pain).  05/10/15  Yes Historical Provider, MD  tetrahydrozoline 0.05 % ophthalmic solution Place 1 drop into both eyes  daily as needed (dry eyes).   Yes Historical Provider, MD    Physical Exam: Filed Vitals:   08/09/15 1343 08/09/15 1445 08/09/15 1629  BP: 110/66    Pulse: 96  98  Temp: 98 F (36.7 C)    TempSrc: Oral    Resp: 13  16  Height: 6\' 1"  (1.854 m)    Weight: 72.576 kg (160 lb)    SpO2: 93% 94% 100%      Constitutional: NAD, calm, comfortable. Appears older than stated age.  Eyes: PERTLA, lids and conjunctivae normal ENMT: Mucous membranes are moist. Posterior pharynx clear of any exudate or lesions.   Neck: normal, supple, no masses, no thyromegaly Respiratory: Mildly diminished bilaterally, no wheezing, no crackles. Normal respiratory effort. No accessory muscle use.  Cardiovascular: S1 & S2 heard, regular rate and rhythm, no extremity edema. No significant JVD. Abdomen: No distension, no tenderness, no masses palpated. Bowel sounds normal.  Musculoskeletal: no clubbing / cyanosis. No joint deformity upper and lower extremities. Normal muscle tone.  Skin: no significant rashes, lesions, ulcers. Warm, dry, well-perfused. Neurologic: CN 2-12 grossly intact. Sensation intact, DTR normal. Strength 5/5 in all 4 limbs.  Psychiatric: Normal judgment and insight. Alert and oriented x 3. Normal mood and affect.     Labs on Admission: I have personally reviewed following labs and imaging studies  CBC:  Recent Labs Lab 08/09/15 1442  WBC 12.4*  NEUTROABS 9.9*  HGB 11.4*  HCT 36.3*  MCV 97.3  PLT [id number]   Basic Metabolic Panel:  Recent Labs Lab 08/09/15 1442  NA 136  K 4.2  CL 98*  CO2 31  GLUCOSE 192*  BUN 17  CREATININE 1.16  CALCIUM 8.5*   GFR: Estimated Creatinine Clearance: 66.9 mL/min (by C-G formula based on Cr of 1.16). Liver Function Tests:  Recent Labs Lab 08/09/15 1442  AST 50*  ALT 30  ALKPHOS 162*  BILITOT 0.7  PROT 7.6  ALBUMIN 3.1*   No results for input(s): LIPASE, AMYLASE in the last 168 hours.  Recent Labs Lab 08/09/15 1441  AMMONIA 29    Coagulation Profile: No results for input(s): INR, PROTIME in the last 168 hours. Cardiac Enzymes:  Recent Labs Lab 08/09/15 1442  TROPONINI <0.03   BNP (last 3 results) No results for input(s): PROBNP in the last 8760 hours. HbA1C: No results for input(s): HGBA1C in the last 72 hours. CBG: No results for input(s): GLUCAP in the last 168 hours. Lipid Profile: No results for input(s): CHOL, HDL, LDLCALC, TRIG,  CHOLHDL, LDLDIRECT in the last 72 hours. Thyroid Function Tests: No results for input(s): TSH, T4TOTAL, FREET4, T3FREE, THYROIDAB in the last 72 hours. Anemia Panel: No results for input(s): VITAMINB12, FOLATE, FERRITIN, TIBC, IRON, RETICCTPCT in the last 72 hours. Urine analysis:    Component Value Date/Time   COLORURINE YELLOW 08/09/2015 1500   APPEARANCEUR CLEAR 08/09/2015 1500   LABSPEC 1.020 08/09/2015 1500   PHURINE 5.5 08/09/2015 1500   GLUCOSEU NEGATIVE 08/09/2015 1500   HGBUR NEGATIVE 08/09/2015 1500   BILIRUBINUR NEGATIVE 08/09/2015 1500   KETONESUR NEGATIVE 08/09/2015 1500   PROTEINUR NEGATIVE 08/09/2015 1500   UROBILINOGEN 0.2 08/23/2014 1321   NITRITE NEGATIVE 08/09/2015 1500   LEUKOCYTESUR NEGATIVE 08/09/2015 1500   Sepsis Labs: @LABRCNTIP (procalcitonin:4,lacticidven:4) )No results found for this or any previous visit (from the past 240 hour(s)).   Radiological Exams on Admission: Dg Chest 2 View  08/09/2015  CLINICAL DATA:  Weakness. History of esophageal cancer. Recently treated for pneumonia. EXAM: CHEST  2 VIEW COMPARISON:  07/16/2015 FINDINGS: Right-sided injectable port is in stable position. Cardiomediastinal silhouette is normal. Mediastinal contours appear intact. There is no evidence of pleural effusion or pneumothorax. There are persistent areas of subtle airspace consolidation in the left lung base and less so in the right lung base. Osseous structures are without acute abnormality. Soft tissues are grossly normal. IMPRESSION: Persistent  airspace consolidation in the left lung base and less so in the right lung base concerning for ongoing pneumonia. Electronically Signed   By: Fidela Salisbury M.D.   On: 08/09/2015 14:26   Ct Head Wo Contrast  08/09/2015  CLINICAL DATA:  Altered mental status.  Esophageal carcinoma. EXAM: CT HEAD WITHOUT CONTRAST TECHNIQUE: Contiguous axial images were obtained from the base of the skull through the vertex without intravenous contrast. COMPARISON:  None. FINDINGS: The ventricles are normal in size and configuration. There is no intracranial mass, hemorrhage, extra-axial fluid collection, or midline shift. The gray-white compartments appear normal. No acute infarct evident. The bony calvarium appears intact. The mastoid air cells are clear. Orbits appear symmetric bilaterally. IMPRESSION: Study within normal limits. Electronically Signed   By: Lowella Grip III M.D.   On: 08/09/2015 15:57    EKG: Independently reviewed. Sinus rhythm.   Assessment/Plan  1. Acute encephalopathy  - Resolved while still in ED, likely secondary to CO2-retention  - Head CT with no acute findings  - Significant hypercarbia on initial ABG, improved with BiPAP    2. HCAP  - Persistent airspace consolidations on CXR and leukocytosis  - Suspect aspiration may be playing a role  - Lactic acid reassuring at 1.1  - Blood cultures incubating, sputum GS and culture requested - Check urine for strep pneumo and legionella antigens  - Treat empirically with vancomycin and Zosyn, tailor according to cultures and clinical progress    3. COPD with chronic respiratory failure  - Dependent on 2.5 Lpm at home  - Mildly diminished on exam with no wheezing  - Continue Spiriva  - DuoNebs q4h prn  - Titrate FiO2 to maintain sats >90%    4. Esophageal cancer with suspected metastases - There is reportedly suspicion for liver metastasis and PET is scheduled for 08/13/15  - Management per oncology team    5. Depression, anxiety   - Appears stable, pt denies SI, HI, or hallucinations  - Continue current-management with Prozac and prn Xanax    6. Chronic pain  - No pain complaints on admission  - Continue current management with  scheduled long-acting oxycodone and short-acting prn  - Bowel regimen ordered   7. Chronic hepatitis C - Currently under treatment with Harvoni, has ~2-wks left per GI notes  - Continue Harvoni    8. Hypothyroidism  - Appears stable; TSH wnl on admission  - Continue current-dose Synthroid    9. Normocytic anemia  - Hgb 11.4 on admission, consistent with priors  - No sign of active blood-loss    DVT prophylaxis: sq Lovenox  Code Status: Full  Family Communication: Wife updated at bedside Disposition Plan: Observe on stepdown  Consults called: None  Admission status: Observation   Vianne Bulls, MD [facility] Pager [phone]  If 7PM-7AM, please contact night-coverage [email or web address] Password [email or web address]  08/09/2015, 4:40 PM

## 2015-08-09 NOTE — Telephone Encounter (Signed)
Speech Pathology  SLP made contact with pt/wife regarding scheduled MBSS appointment. He was seen in my office yesterday for a clinical swallow evaluation and MBSS recommended. He is scheduled to have MBSS at [facility] on Monday, June 5 after his PET scan. PET is scheduled for 7 AM this Monday.   Thank you,  Genene Churn, CCC-SLP [phone]

## 2015-08-09 NOTE — Addendum Note (Signed)
Addended by: Ephraim Hamburger on: 08/09/2015 11:49 AM   Modules accepted: Orders

## 2015-08-09 NOTE — Progress Notes (Signed)
1320:  Arrives to clinic for IVF infusion.  Pt is disoriented and lethargic.  It is noted that the pt, who is oxygen dependent, arrives with an empty tank; SpO2 is presently 86%.  Placed on O2 4L/min per [place].  SpO2 increase to 98%.  MD notified of pt condition; instructed to transport pt to ED; Magda Paganini, RN notified of pt coming. Pt transported on O2 4L and via wheelchair to ED.  Report given to J. Tasia Catchings, Therapist, sports.

## 2015-08-09 NOTE — ED Provider Notes (Signed)
CSN: [id number]     Arrival date & time 08/09/15  1339 History   First MD Initiated Contact with Patient 08/09/15 1345     Chief Complaint  Patient presents with  . Altered Mental Status      Patient is a 65 y.o. male presenting with altered mental status. The history is provided by the patient and a significant other. The history is limited by the condition of the patient (AMS).  Altered Mental Status Pt was seen at 1345. Per pt's wife and pt:  Pt was sent to the ED from the [facility] for hypoxia. Pt apparently rode to the hospital without his oxygen because his "tank was empty." Pt's O2 Sat was "86% R/A." Pt's wife states pt was recently tx for pneumonia and receiving IVF for poor PO intake. Pt's wife also states pt has had AMS and increasing coughing since last night. Denies CP/palpitations, no cough, no abd pain, no N/V/D, no fevers, no focal motor weakness.    Past Medical History  Diagnosis Date  . COPD (chronic obstructive pulmonary disease) ([place])   . ETOH abuse     stopped 10 years ago  . Depression     suicide attempt 20 years ago  . Arthritis   . Asthma   . Viral hepatitis C   . Diabetes mellitus without complication ([place])   . Hypothyroidism   . Cancer ([place])     esophageal  . Pneumonia 02/2015  . Itching     both feet, known has athlet's feet now  . Esophageal cancer ([place]) 04/03/2015  . Pulmonary nodules 07/09/2015  . On home O2    Past Surgical History  Procedure Laterality Date  . Other surgical history Left 1995    fatty tissue tumor right upper thigh  . Dental surgery N/A January 2016    "dentures and spurs"   . Tumor excision Left 1990    inner thigh  . Colonoscopy with propofol N/A 03/26/2015    RMR: colonic diverticulosis redundatn t colon. single colonic polyp removed as described above.   . Esophagogastroduodenoscopy (egd) with propofol N/A 03/26/2015    RMR: Bulky esophageal tumo status post biopsy 2cm hiatal hernia  . Biopsy  03/26/2015    Procedure:  BIOPSY;  Surgeon: Daneil Dolin, MD;  [facility];  Service: Endoscopy;;  esophageal mass  . Polypectomy  03/26/2015    Procedure: POLYPECTOMY;  Surgeon: Daneil Dolin, MD;  Location: [facility];  Service: Endoscopy;;  polypectomy at ileocecal valve   Family History  Problem Relation Age of Onset  . Pancreatic cancer Mother   . Lung cancer Father   . Colon cancer Neg Hx    Social History  Substance Use Topics  . Smoking status: Former Smoker -- 1.50 packs/day for 47 years    Types: Cigarettes    Start date: 12/03/1966    Quit date: 12/08/2013  . Smokeless tobacco: Never Used  . Alcohol Use: No     Comment: Quit 2006: previously drank heavily. Had 1 drink 2-3 years ago (2013-2014)    Review of Systems  Unable to perform ROS: Mental status change     Allergies  Demerol  Home Medications   Prior to Admission medications   Medication Sig Start Date End Date Taking? Authorizing Provider  ACCU-CHEK AVIVA PLUS test strip 2 (two) times daily. use for testing 05/02/15   Historical Provider, MD  acetaminophen (TYLENOL) 500 MG tablet Take 500 mg by mouth every 6 (  six) hours as needed for fever.    Historical Provider, MD  albuterol (PROVENTIL HFA;VENTOLIN HFA) 108 (90 BASE) MCG/ACT inhaler Inhale 1-2 puffs into the lungs every 6 (six) hours as needed for wheezing or shortness of breath.     Historical Provider, MD  ALPRAZolam Duanne Moron) 1 MG tablet Take 1 tablet (1 mg total) by mouth every 8 (eight) hours as needed for anxiety. 07/24/15   Patrici Ranks, MD  docusate sodium (COLACE) 100 MG capsule Take 100 mg by mouth 2 (two) times daily. 03/10/15   Historical Provider, MD  FLUoxetine (PROZAC) 20 MG capsule Take 1 capsule (20 mg total) by mouth daily. 06/11/15   Baird Cancer, PA-C  HUMULIN R 100 UNIT/ML injection Inject 2-12 Units into the skin 2 (two) times daily as needed for high blood sugar. 151-200 = 2 units, 201-250 = 4 units, 251-300 = 6 units, 301-350 = 8 units,  351-400 = 10 units, 401-450 = 12 units. 03/13/15   Historical Provider, MD  ipratropium-albuterol (DUONEB) 0.5-2.5 (3) MG/3ML SOLN INHALE CONTENTS OF 1 VIAL IN NEBULIZER EVERY 4 HOURS AS NEEDED FOR SHORTNESS OF BREATH. 03/10/15   Historical Provider, MD  Ledipasvir-Sofosbuvir (HARVONI) 90-400 MG TABS Take 1 tablet by mouth daily.     Historical Provider, MD  levothyroxine (SYNTHROID, LEVOTHROID) 25 MCG tablet Take 25 mcg by mouth daily. 06/27/15   Historical Provider, MD  lidocaine (XYLOCAINE) 2 % solution Reported on 07/24/2015 05/22/15   Historical Provider, MD  lidocaine-prilocaine (EMLA) cream Apply a quarter size amount to port site 1 hour prior to chemo. Do not rub in. Cover with plastic wrap. 03/30/15   Patrici Ranks, MD  meloxicam (MOBIC) 7.5 MG tablet TAKE ONE TABLET BY MOUTH ONCE DAILY 05/01/15   Historical Provider, MD  metFORMIN (GLUCOPHAGE) 500 MG tablet Take 500 mg by mouth 2 (two) times daily. 03/10/15   Historical Provider, MD  nitroGLYCERIN (NITROSTAT) 0.4 MG SL tablet Place 1 tablet (0.4 mg total) under the tongue every 5 (five) minutes as needed for chest pain. 09/21/14   Herminio Commons, MD  ondansetron (ZOFRAN) 8 MG tablet TAKE 1 TABLET (8 MG TOTAL) BY MOUTH EVERY 8 (EIGHT) HOURS AS NEEDED FOR NAUSEA OR VOMITING. 07/02/15   Patrici Ranks, MD  oxyCODONE (OXYCONTIN) 20 mg 12 hr tablet Take 1 tablet (20 mg total) by mouth every 8 (eight) hours. 07/24/15   Patrici Ranks, MD  oxyCODONE-acetaminophen (PERCOCET) 10-325 MG tablet Take 1 tablet by mouth every 4 (four) hours as needed for pain. 07/24/15   Patrici Ranks, MD  prochlorperazine (COMPAZINE) 10 MG tablet TAKE 1 TABLET (10 MG TOTAL) BY MOUTH EVERY 6 (SIX) HOURS AS NEEDED FOR NAUSEA OR VOMITING. 07/02/15   Patrici Ranks, MD  SPIRIVA HANDIHALER 18 MCG inhalation capsule Place 1 puff into inhaler and inhale daily. 12/04/14   Historical Provider, MD  sucralfate (CARAFATE) 1 g tablet Take 1 tablet by mouth 2 (two) times daily  as needed (throat pain).  05/10/15   Historical Provider, MD  tetrahydrozoline 0.05 % ophthalmic solution Place 1 drop into both eyes daily as needed (dry eyes).    Historical Provider, MD   BP 110/66 mmHg  Pulse 96  Temp(Src) 98 F (36.7 C) (Oral)  Resp 13  Ht 6\' 1"  (1.854 m)  Wt 160 lb (72.576 kg)  BMI 21.11 kg/m2  SpO2 93% Physical Exam 1350: Physical examination:  Nursing notes reviewed; Vital signs and O2 SAT reviewed;  Constitutional:  Thin, frail. In no acute distress; Head:  Normocephalic, atraumatic; Eyes: EOMI, PERRL, No scleral icterus; ENMT: Mouth and pharynx normal, Mucous membranes dry; Neck: Supple, Full range of motion, No lymphadenopathy; Cardiovascular: Regular rate and rhythm, No gallop; Respiratory: Breath sounds diminished & equal bilaterally, No wheezes.  Speaking short sentences, Normal respiratory effort/excursion. Weak cough.; Chest: Nontender, Movement normal; Abdomen: Soft, Nontender, Nondistended, Normal bowel sounds; Genitourinary: No CVA tenderness; Extremities: Pulses normal, No tenderness, No edema, No calf edema or asymmetry.; Neuro: Lethargic, but will open eyes to name. Confused re: time, events. No facial droop. Speech quiet, clear. Grips equal. Strength 5/5 equal bilat UE's and LE's. Moves all extremities spontaneously and to command without apparent gross focal motor deficits.; Skin: Color normal, Warm, Dry.   ED Course  Procedures (including critical care time) Labs Review   Imaging Review  I have personally reviewed and evaluated these images and lab results as part of my medical decision-making.   EKG Interpretation   Date/Time:  Thursday August 09 2015 14:23:05 EDT Ventricular Rate:  93 PR Interval:  119 QRS Duration: 83 QT Interval:  368 QTC Calculation: 458 R Axis:   73 Text Interpretation:  Sinus rhythm Borderline short PR interval When  compared with ECG of 07/16/2015 No significant change was found Confirmed by  [facility]  MD, Nunzio Cory  [phone]) on 08/09/2015 2:27:35 PM      MDM  MDM Reviewed: previous chart, nursing note and vitals Reviewed previous: labs and ECG Interpretation: labs, ECG, x-ray and CT scan      Results for orders placed or performed during the hospital encounter of 08/09/15  Urinalysis, Routine w reflex microscopic  Result Value Ref Range   Color, Urine YELLOW YELLOW   APPearance CLEAR CLEAR   Specific Gravity, Urine 1.020 1.005 - 1.030   pH 5.5 5.0 - 8.0   Glucose, UA NEGATIVE NEGATIVE mg/dL   Hgb urine dipstick NEGATIVE NEGATIVE   Bilirubin Urine NEGATIVE NEGATIVE   Ketones, ur NEGATIVE NEGATIVE mg/dL   Protein, ur NEGATIVE NEGATIVE mg/dL   Nitrite NEGATIVE NEGATIVE   Leukocytes, UA NEGATIVE NEGATIVE  Urine rapid drug screen (hosp performed)  Result Value Ref Range   Opiates POSITIVE (A) NONE DETECTED   Cocaine NONE DETECTED NONE DETECTED   Benzodiazepines POSITIVE (A) NONE DETECTED   Amphetamines NONE DETECTED NONE DETECTED   Tetrahydrocannabinol NONE DETECTED NONE DETECTED   Barbiturates NONE DETECTED NONE DETECTED  Blood gas, arterial  Result Value Ref Range   O2 Content 2.0 L/min   Delivery systems NASAL CANNULA    pH, Arterial 7.349 (L) 7.350 - 7.450   pCO2 arterial 58.3 (HH) 35.0 - 45.0 mmHg   pO2, Arterial 67.2 (L) 80.0 - 100.0 mmHg   Bicarbonate 28.8 (H) 20.0 - 24.0 mEq/L   Acid-Base Excess 5.9 (H) 0.0 - 2.0 mmol/L   O2 Saturation 92.8 %   Collection site RIGHT RADIAL    Drawn by [id number]    Sample type ARTERIAL    Allens test (pass/fail) PASS PASS  Comprehensive metabolic panel  Result Value Ref Range   Sodium 136 135 - 145 mmol/L   Potassium 4.2 3.5 - 5.1 mmol/L   Chloride 98 (L) 101 - 111 mmol/L   CO2 31 22 - 32 mmol/L   Glucose, Bld 192 (H) 65 - 99 mg/dL   BUN 17 6 - 20 mg/dL   Creatinine, Ser 1.16 0.61 - 1.24 mg/dL   Calcium 8.5 (L) 8.9 - 10.3 mg/dL   Total Protein 7.6  6.5 - 8.1 g/dL   Albumin 3.1 (L) 3.5 - 5.0 g/dL   AST 50 (H) 15 - 41 U/L   ALT 30 17 - 63  U/L   Alkaline Phosphatase 162 (H) 38 - 126 U/L   Total Bilirubin 0.7 0.3 - 1.2 mg/dL   GFR calc non Af Amer >60 >60 mL/min   GFR calc Af Amer >60 >60 mL/min   Anion gap 7 5 - 15  Ethanol  Result Value Ref Range   Alcohol, Ethyl (B) <5 <5 mg/dL  Acetaminophen level  Result Value Ref Range   Acetaminophen (Tylenol), Serum <10 (L) 10 - 30 ug/mL  Brain natriuretic peptide  Result Value Ref Range   B Natriuretic Peptide 215.0 (H) 0.0 - 100.0 pg/mL  Troponin I  Result Value Ref Range   Troponin I <0.03 XX123456 ng/mL  Salicylate level  Result Value Ref Range   Salicylate Lvl 123456 2.8 - 30.0 mg/dL  Lactic acid, plasma  Result Value Ref Range   Lactic Acid, Venous 1.8 0.5 - 2.0 mmol/L  CBC with Differential  Result Value Ref Range   WBC 12.4 (H) 4.0 - 10.5 K/uL   RBC 3.73 (L) 4.22 - 5.81 MIL/uL   Hemoglobin 11.4 (L) 13.0 - 17.0 g/dL   HCT 36.3 (L) 39.0 - 52.0 %   MCV 97.3 78.0 - 100.0 fL   MCH 30.6 26.0 - 34.0 pg   MCHC 31.4 30.0 - 36.0 g/dL   RDW 13.9 11.5 - 15.5 %   Platelets 159 150 - 400 K/uL   Neutrophils Relative % 80 %   Neutro Abs 9.9 (H) 1.7 - 7.7 K/uL   Lymphocytes Relative 7 %   Lymphs Abs 0.8 0.7 - 4.0 K/uL   Monocytes Relative 13 %   Monocytes Absolute 1.7 (H) 0.1 - 1.0 K/uL   Eosinophils Relative 0 %   Eosinophils Absolute 0.0 0.0 - 0.7 K/uL   Basophils Relative 0 %   Basophils Absolute 0.0 0.0 - 0.1 K/uL  Ammonia  Result Value Ref Range   Ammonia 29 9 - 35 umol/L   Dg Chest 2 View 08/09/2015  CLINICAL DATA:  Weakness. History of esophageal cancer. Recently treated for pneumonia. EXAM: CHEST  2 VIEW COMPARISON:  07/16/2015 FINDINGS: Right-sided injectable port is in stable position. Cardiomediastinal silhouette is normal. Mediastinal contours appear intact. There is no evidence of pleural effusion or pneumothorax. There are persistent areas of subtle airspace consolidation in the left lung base and less so in the right lung base. Osseous structures are without  acute abnormality. Soft tissues are grossly normal. IMPRESSION: Persistent airspace consolidation in the left lung base and less so in the right lung base concerning for ongoing pneumonia. Electronically Signed   By: Fidela Salisbury M.D.   On: 08/09/2015 14:26    Ct Head Wo Contrast 08/09/2015  CLINICAL DATA:  Altered mental status.  Esophageal carcinoma. EXAM: CT HEAD WITHOUT CONTRAST TECHNIQUE: Contiguous axial images were obtained from the base of the skull through the vertex without intravenous contrast. COMPARISON:  None. FINDINGS: The ventricles are normal in size and configuration. There is no intracranial mass, hemorrhage, extra-axial fluid collection, or midline shift. The gray-white compartments appear normal. No acute infarct evident. The bony calvarium appears intact. The mastoid air cells are clear. Orbits appear symmetric bilaterally. IMPRESSION: Study within normal limits. Electronically Signed   By: Lowella Grip III M.D.   On: 08/09/2015 15:57    1605:  BNP mildly elevated, no old  to compare and CXR without acute CHF; pt appears clinically dehydrated at this time. Will continue judicious IVF. Will tx for HCAP. PCO2 elevated on ABG (PO2 per baseline); will start Bipap. Pt more awake, requesting "some of my xanax" before bipap; will give small dose ativan. Dx and testing d/w pt and family.  Questions answered.  Verb understanding, agreeable to admit. T/C to [facility] Dr. Myna Hidalgo, case discussed, including:  HPI, pertinent PM/SHx, VS/PE, dx testing, ED course and treatment:  Agreeable to admit, requests to write temporary orders, obtain stepdown bed to team [facility].   Francine Graven, DO 08/10/15 2142

## 2015-08-09 NOTE — ED Notes (Signed)
Patient brought over from cancer center. Recently treated for pneumonia. Wears oxygen at home, rode to hospital without O2 due to tank being empty. Patient's oxygen 86% in cancer center. Patients wife also reports patient has been altered since last night.

## 2015-08-10 ENCOUNTER — Observation Stay (HOSPITAL_COMMUNITY): Payer: Medicaid Other

## 2015-08-10 DIAGNOSIS — G9340 Encephalopathy, unspecified: Secondary | ICD-10-CM

## 2015-08-10 DIAGNOSIS — J189 Pneumonia, unspecified organism: Secondary | ICD-10-CM | POA: Diagnosis not present

## 2015-08-10 DIAGNOSIS — F329 Major depressive disorder, single episode, unspecified: Secondary | ICD-10-CM | POA: Diagnosis present

## 2015-08-10 DIAGNOSIS — C155 Malignant neoplasm of lower third of esophagus: Secondary | ICD-10-CM | POA: Diagnosis present

## 2015-08-10 DIAGNOSIS — J9621 Acute and chronic respiratory failure with hypoxia: Secondary | ICD-10-CM | POA: Diagnosis present

## 2015-08-10 DIAGNOSIS — Z800 Family history of malignant neoplasm of digestive organs: Secondary | ICD-10-CM | POA: Diagnosis not present

## 2015-08-10 DIAGNOSIS — Z7951 Long term (current) use of inhaled steroids: Secondary | ICD-10-CM | POA: Diagnosis not present

## 2015-08-10 DIAGNOSIS — I2510 Atherosclerotic heart disease of native coronary artery without angina pectoris: Secondary | ICD-10-CM | POA: Diagnosis present

## 2015-08-10 DIAGNOSIS — J9622 Acute and chronic respiratory failure with hypercapnia: Secondary | ICD-10-CM | POA: Diagnosis present

## 2015-08-10 DIAGNOSIS — B182 Chronic viral hepatitis C: Secondary | ICD-10-CM

## 2015-08-10 DIAGNOSIS — Z915 Personal history of self-harm: Secondary | ICD-10-CM | POA: Diagnosis not present

## 2015-08-10 DIAGNOSIS — E119 Type 2 diabetes mellitus without complications: Secondary | ICD-10-CM | POA: Diagnosis present

## 2015-08-10 DIAGNOSIS — J440 Chronic obstructive pulmonary disease with acute lower respiratory infection: Secondary | ICD-10-CM | POA: Diagnosis present

## 2015-08-10 DIAGNOSIS — Z79899 Other long term (current) drug therapy: Secondary | ICD-10-CM | POA: Diagnosis not present

## 2015-08-10 DIAGNOSIS — E872 Acidosis: Secondary | ICD-10-CM | POA: Diagnosis present

## 2015-08-10 DIAGNOSIS — J438 Other emphysema: Secondary | ICD-10-CM | POA: Diagnosis not present

## 2015-08-10 DIAGNOSIS — F419 Anxiety disorder, unspecified: Secondary | ICD-10-CM | POA: Diagnosis present

## 2015-08-10 DIAGNOSIS — R4182 Altered mental status, unspecified: Secondary | ICD-10-CM | POA: Diagnosis present

## 2015-08-10 DIAGNOSIS — G8929 Other chronic pain: Secondary | ICD-10-CM | POA: Diagnosis present

## 2015-08-10 DIAGNOSIS — Z8701 Personal history of pneumonia (recurrent): Secondary | ICD-10-CM | POA: Diagnosis not present

## 2015-08-10 DIAGNOSIS — Z9981 Dependence on supplemental oxygen: Secondary | ICD-10-CM | POA: Diagnosis not present

## 2015-08-10 DIAGNOSIS — D649 Anemia, unspecified: Secondary | ICD-10-CM | POA: Diagnosis present

## 2015-08-10 DIAGNOSIS — J690 Pneumonitis due to inhalation of food and vomit: Secondary | ICD-10-CM | POA: Diagnosis present

## 2015-08-10 DIAGNOSIS — Z801 Family history of malignant neoplasm of trachea, bronchus and lung: Secondary | ICD-10-CM | POA: Diagnosis not present

## 2015-08-10 DIAGNOSIS — E089 Diabetes mellitus due to underlying condition without complications: Secondary | ICD-10-CM

## 2015-08-10 DIAGNOSIS — E038 Other specified hypothyroidism: Secondary | ICD-10-CM

## 2015-08-10 DIAGNOSIS — Z794 Long term (current) use of insulin: Secondary | ICD-10-CM

## 2015-08-10 DIAGNOSIS — E440 Moderate protein-calorie malnutrition: Secondary | ICD-10-CM | POA: Diagnosis present

## 2015-08-10 DIAGNOSIS — Z6821 Body mass index (BMI) 21.0-21.9, adult: Secondary | ICD-10-CM | POA: Diagnosis not present

## 2015-08-10 DIAGNOSIS — J441 Chronic obstructive pulmonary disease with (acute) exacerbation: Secondary | ICD-10-CM | POA: Diagnosis present

## 2015-08-10 DIAGNOSIS — Z87891 Personal history of nicotine dependence: Secondary | ICD-10-CM | POA: Diagnosis not present

## 2015-08-10 DIAGNOSIS — E039 Hypothyroidism, unspecified: Secondary | ICD-10-CM | POA: Diagnosis present

## 2015-08-10 DIAGNOSIS — J45909 Unspecified asthma, uncomplicated: Secondary | ICD-10-CM | POA: Diagnosis present

## 2015-08-10 LAB — CBC WITH DIFFERENTIAL/PLATELET
BASOS ABS: 0 10*3/uL (ref 0.0–0.1)
Basophils Relative: 0 %
EOS PCT: 2 %
Eosinophils Absolute: 0.2 10*3/uL (ref 0.0–0.7)
HCT: 34.8 % — ABNORMAL LOW (ref 39.0–52.0)
HEMOGLOBIN: 10.8 g/dL — AB (ref 13.0–17.0)
LYMPHS ABS: 0.7 10*3/uL (ref 0.7–4.0)
LYMPHS PCT: 9 %
MCH: 30.5 pg (ref 26.0–34.0)
MCHC: 31 g/dL (ref 30.0–36.0)
MCV: 98.3 fL (ref 78.0–100.0)
Monocytes Absolute: 1.3 10*3/uL — ABNORMAL HIGH (ref 0.1–1.0)
Monocytes Relative: 17 %
NEUTROS ABS: 5.7 10*3/uL (ref 1.7–7.7)
NEUTROS PCT: 72 %
PLATELETS: 153 10*3/uL (ref 150–400)
RBC: 3.54 MIL/uL — AB (ref 4.22–5.81)
RDW: 13.8 % (ref 11.5–15.5)
WBC: 7.9 10*3/uL (ref 4.0–10.5)

## 2015-08-10 LAB — COMPREHENSIVE METABOLIC PANEL
ALT: 25 U/L (ref 17–63)
ANION GAP: 5 (ref 5–15)
AST: 41 U/L (ref 15–41)
Albumin: 2.8 g/dL — ABNORMAL LOW (ref 3.5–5.0)
Alkaline Phosphatase: 143 U/L — ABNORMAL HIGH (ref 38–126)
BILIRUBIN TOTAL: 0.7 mg/dL (ref 0.3–1.2)
BUN: 12 mg/dL (ref 6–20)
CHLORIDE: 100 mmol/L — AB (ref 101–111)
CO2: 33 mmol/L — ABNORMAL HIGH (ref 22–32)
Calcium: 8.5 mg/dL — ABNORMAL LOW (ref 8.9–10.3)
Creatinine, Ser: 0.96 mg/dL (ref 0.61–1.24)
Glucose, Bld: 101 mg/dL — ABNORMAL HIGH (ref 65–99)
POTASSIUM: 4 mmol/L (ref 3.5–5.1)
Sodium: 138 mmol/L (ref 135–145)
TOTAL PROTEIN: 7.2 g/dL (ref 6.5–8.1)

## 2015-08-10 LAB — BLOOD GAS, ARTERIAL
ACID-BASE EXCESS: 6.8 mmol/L — AB (ref 0.0–2.0)
BICARBONATE: 29.4 meq/L — AB (ref 20.0–24.0)
DRAWN BY: 277331
O2 CONTENT: 3 L/min
O2 SAT: 98.1 %
PCO2 ART: 55 mmHg — AB (ref 35.0–45.0)
PH ART: 7.378 (ref 7.350–7.450)
Patient temperature: 37
pO2, Arterial: 104 mmHg — ABNORMAL HIGH (ref 80.0–100.0)

## 2015-08-10 LAB — GLUCOSE, CAPILLARY
GLUCOSE-CAPILLARY: 108 mg/dL — AB (ref 65–99)
GLUCOSE-CAPILLARY: 206 mg/dL — AB (ref 65–99)
GLUCOSE-CAPILLARY: 258 mg/dL — AB (ref 65–99)
GLUCOSE-CAPILLARY: 204 mg/dL — AB (ref 65–99)
Glucose-Capillary: 164 mg/dL — ABNORMAL HIGH (ref 65–99)

## 2015-08-10 LAB — HIV ANTIBODY (ROUTINE TESTING W REFLEX): HIV SCREEN 4TH GENERATION: NONREACTIVE

## 2015-08-10 LAB — STREP PNEUMONIAE URINARY ANTIGEN: STREP PNEUMO URINARY ANTIGEN: NEGATIVE

## 2015-08-10 LAB — AMMONIA: Ammonia: 30 umol/L (ref 9–35)

## 2015-08-10 MED ORDER — IPRATROPIUM-ALBUTEROL 0.5-2.5 (3) MG/3ML IN SOLN
3.0000 mL | Freq: Four times a day (QID) | RESPIRATORY_TRACT | Status: DC
Start: 2015-08-10 — End: 2015-08-11
  Administered 2015-08-10 – 2015-08-11 (×4): 3 mL via RESPIRATORY_TRACT
  Filled 2015-08-10 (×5): qty 3

## 2015-08-10 MED ORDER — ALBUTEROL SULFATE (2.5 MG/3ML) 0.083% IN NEBU: 2.5000 mg | INHALATION_SOLUTION | RESPIRATORY_TRACT | Status: DC | PRN

## 2015-08-10 MED ORDER — ADULT MULTIVITAMIN W/MINERALS CH
1.0000 | ORAL_TABLET | Freq: Every day | ORAL | Status: DC
  Administered 2015-08-10 – 2015-08-12 (×3): 1 via ORAL
  Filled 2015-08-10 (×3): qty 1

## 2015-08-10 MED ORDER — BOOST / RESOURCE BREEZE PO LIQD
1.0000 | Freq: Three times a day (TID) | ORAL | Status: DC
  Administered 2015-08-10 – 2015-08-13 (×9): 1 via ORAL

## 2015-08-10 MED ORDER — GUAIFENESIN ER 600 MG PO TB12
1200.0000 mg | ORAL_TABLET | Freq: Two times a day (BID) | ORAL | Status: DC
  Administered 2015-08-10 – 2015-08-12 (×6): 1200 mg via ORAL
  Filled 2015-08-10 (×6): qty 2

## 2015-08-10 MED ORDER — IOPAMIDOL (ISOVUE-300) INJECTION 61%
75.0000 mL | Freq: Once | INTRAVENOUS | Status: AC | PRN
Start: 2015-08-10 — End: 2015-08-10
  Administered 2015-08-10: 75 mL via INTRAVENOUS

## 2015-08-10 MED ORDER — ZIPRASIDONE MESYLATE 20 MG IM SOLR
20.0000 mg | Freq: Once | INTRAMUSCULAR | Status: DC
  Filled 2015-08-10 (×2): qty 20

## 2015-08-10 MED ORDER — LEDIPASVIR-SOFOSBUVIR 90-400 MG PO TABS
1.0000 | ORAL_TABLET | Freq: Every day | ORAL | Status: DC
  Administered 2015-08-10 – 2015-08-12 (×3): 1 via ORAL

## 2015-08-10 MED ORDER — METHYLPREDNISOLONE SODIUM SUCC 40 MG IJ SOLR
40.0000 mg | Freq: Two times a day (BID) | INTRAMUSCULAR | Status: DC
  Administered 2015-08-10 – 2015-08-12 (×6): 40 mg via INTRAVENOUS
  Filled 2015-08-10 (×6): qty 1

## 2015-08-10 NOTE — Progress Notes (Signed)
Was called to see patient for agitation.  Patient was seen this morning and was calm, cooperative and appropriate in speech. Since my visit this morning, patient has become increasingly agitated and confused. He is requesting to speak to Dr. Whitney Muse.  On exam, he does not appear to have any focal deficits, and mental status has now improved without any medication intervention. His wife said that he did have similar confusion in the past when he was in the ICU.  I discussed the case with Dr. Whitney Muse and requested that she visit the patient. After speaking with Dr. Whitney Muse, patient appears to be calm and cooperative. He is apologetic about his behavior. Will check ammonia and abg. No focal deficits at this time. CT head done yesterday did not show any evidence of metastatic disease. Will continue to monitor closely.  I am grateful for Dr. Donald Pore assistance.  MEMON,JEHANZEB

## 2015-08-10 NOTE — Evaluation (Signed)
Physical Therapy Evaluation Patient Details Name: Brent Franco MRN: [id number] DOB: 1950-06-13 Today's Date: 08/10/2015   History of Present Illness  65 yo M admitted with altered mental status, cough, hypoxia. CXR is notable for persistent airspace consolidation in the left base, and to a lesser degree in the right base, consistent with persistent pneumonia. Acute encephalopathy suspected secondary to hypercarbia in the setting of HCAP with underlying COPD and chronic hypoxic respiratory failure.  PMH: COPD, ETOH abuse - stopped 10 yrs ago, Depression - suicide attempt 20 yrs ago, arthritis, asthma, viral Hep C, DM, hypothyroidis, CA - esophageal with suspected liver mets, PNA, pulmonary nodules, home O2, fatty tissue tumor removal of the right thigh.  Clinical Impression  Pt received in bed, wife present, and pt was agreeable to PT evaluation.  Pt reports he was using the RW occasionally just prior to admission.  Wife was assisting with ADL's.  Today, pt required Min guard for transfers with RW, and ambulated 62ft with RW and Min guard.  Pt wears O2 at home at 2-3L/min.  Vitals during PT today:  Resting: 139/83, HR: 93bpm, and SpO2: 98% on 3L At end of tx: 132/79, HR: 92bpm, and SpO2: 96% on 3L. Recommend that pt return home with HHPT, and use RW to improve gait and balance at home.     Follow Up Recommendations Home health PT    Equipment Recommendations  None recommended by PT    Recommendations for Other Services       Precautions / Restrictions Precautions Precautions: Fall Restrictions Weight Bearing Restrictions: No      Mobility  Bed Mobility Overal bed mobility: Modified Independent                Transfers Overall transfer level: Needs assistance Equipment used: Rolling walker (2 wheeled) Transfers: Sit to/from Stand Sit to Stand: Min guard         General transfer comment: Pt assisted with bed<>BSC transfer with no AD and CGA.  Pt was independent with  hygiene.  Ambulation/Gait Ambulation/Gait assistance: Min guard Ambulation Distance (Feet): 10 Feet Assistive device: Rolling walker (2 wheeled) Gait Pattern/deviations: Step-through pattern;Trunk flexed     General Gait Details: slowed cadence  Stairs            Wheelchair Mobility    Modified Rankin (Stroke Patients Only)       Balance Overall balance assessment: Needs assistance         Standing balance support: Bilateral upper extremity supported Standing balance-Leahy Scale: Fair                               Pertinent Vitals/Pain Pain Assessment: No/denies pain    Home Living   Living Arrangements: Spouse/significant other Available Help at Discharge: Family Type of Home: House Home Access: Level entry     Home Layout: One level Home Equipment: Shower seat - built in;Walker - 2 wheels;Bedside commode;Hand held shower head      Prior Function Level of Independence: Needs assistance   Gait / Transfers Assistance Needed: Pt reports use of a 2 wheeled walker during functional mobility and community mobility tasks  ADL's / Homemaking Assistance Needed: Pt reports wife assists with all ADL tasks including dressing, bathing, grooming, toileting as well as IADL tasks including meal preparation, medication management, and driving        Hand Dominance   Dominant Hand: Right    Extremity/Trunk Assessment  Upper Extremity Assessment: Generalized weakness           Lower Extremity Assessment: Generalized weakness         Communication   Communication: No difficulties  Cognition Arousal/Alertness: Awake/alert Behavior During Therapy: WFL for tasks assessed/performed Overall Cognitive Status: Impaired/Different from baseline (Wife expressed that he is still getting confused at times. Re: states he has a cane when he doesn't. )                      General Comments      Exercises        Assessment/Plan    PT  Assessment Patient needs continued PT services  PT Diagnosis Difficulty walking;Abnormality of gait;Generalized weakness   PT Problem List Decreased strength;Decreased activity tolerance;Decreased balance;Decreased mobility;Decreased cognition;Decreased knowledge of use of DME;Decreased safety awareness;Decreased knowledge of precautions;Cardiopulmonary status limiting activity  PT Treatment Interventions DME instruction;Gait training;Functional mobility training;Therapeutic activities;Therapeutic exercise;Balance training;Patient/family education   PT Goals (Current goals can be found in the Care Plan section) Acute Rehab PT Goals Patient Stated Goal: To go home PT Goal Formulation: With patient/family Time For Goal Achievement: 08/17/15 Potential to Achieve Goals: Good    Frequency Min 3X/week   Barriers to discharge        Co-evaluation               End of Session Equipment Utilized During Treatment: Gait belt Activity Tolerance: Patient tolerated treatment well Patient left: in chair;with call bell/phone within reach;with family/visitor present Nurse Communication: Mobility status    Functional Assessment Tool Used: [employer] "6-clicks" Functional Limitation: Mobility: Walking and moving around Mobility: Walking and Moving Around Current Status [phone]): At least 20 percent but less than 40 percent impaired, limited or restricted Mobility: Walking and Moving Around Goal Status [phone]): At least 1 percent but less than 20 percent impaired, limited or restricted    Time: 1350-1413 PT Time Calculation (min) (ACUTE ONLY): 23 min   Charges:   PT Evaluation $PT Eval Moderate Complexity: 1 Procedure PT Treatments $Therapeutic Activity: 8-22 mins   PT G Codes:   PT G-Codes **NOT FOR INPATIENT CLASS** Functional Assessment Tool Used: [employer] "6-clicks" Functional Limitation: Mobility: Walking and moving around Mobility: Walking and Moving  Around Current Status [phone]): At least 20 percent but less than 40 percent impaired, limited or restricted Mobility: Walking and Moving Around Goal Status [phone]): At least 1 percent but less than 20 percent impaired, limited or restricted   Tacy Learn, PT, DPT X: [id number]   08/10/2015, 3:17 PM

## 2015-08-10 NOTE — Clinical Social Work Note (Signed)
Clinical Social Work Assessment  Patient Details  Name: Brent Franco MRN: [id number] Date of Birth: 09/03/1950  Date of referral:  08/10/15               Reason for consult:  Other (Comment Required) (COPD Gold)                Permission sought to share information with:    Permission granted to share information::     Name::        Agency::     Relationship::     Contact Information:     Housing/Transportation Living arrangements for the past 2 months:  Single Family Home Source of Information:  Patient, Spouse Patient Interpreter Needed:  None Criminal Activity/Legal Involvement Pertinent to Current Situation/Hospitalization:  No - Comment as needed Significant Relationships:  Adult Children, Spouse Lives with:  Adult Children, Spouse Do you feel safe going back to the place where you live?  Yes Need for family participation in patient care:  Yes (Comment)  Care giving concerns: None identified.    Social Worker assessment / plan:  CSW discussed COPD gold protocol with patient and spouse, who was at bedside.  CSW completed PHQ-9 with patient (score 10) and GAD 7 (score 21) these scores appear to be exacerbated due to patient's cancer diagnosis.  Mrs. Brundrett advised that she and patient are residing with one of their daughters currently. She stated that at baseline, patient ambulates unassisted although he has a walker.  She stated that patient is on Medication for both anxiety and depression and that the medications were recently increased. Mrs. Snide advised that patient has been overwhelmed with his cancer diagnosis as well as being put on chronic oxygen (two weeks ago). CSW discussed counseling options, Mrs. Sharaf stated that she feels that more appointments would be further overwhelming to patient. She stated that the family did not have any CSW needs. CSW provided supportive counseling.  CSW signing off.   Employment status:  Disabled (Comment on whether or not currently  receiving Disability) Insurance information:  Medicaid In [place] PT Recommendations:  Not assessed at this time Information / Referral to community resources:     Patient/Family's Response to care:  Family feels that they have all services that they need currently.   Patient/Family's Understanding of and Emotional Response to Diagnosis, Current Treatment, and Prognosis: Patient and family understand patient's diagnosis, treatment and prognosis and are overwhelmed at this time.   Emotional Assessment Appearance:  Appears older than stated age Attitude/Demeanor/Rapport:   (Cooperative) Affect (typically observed):  Accepting Orientation:  Oriented to Self, Oriented to Place, Oriented to Situation, Oriented to  Time Alcohol / Substance use:  Not Applicable Psych involvement (Current and /or in the community):  No (Comment)  Discharge Needs  Concerns to be addressed:  Discharge Planning Concerns Readmission within the last 30 days:  Yes Current discharge risk:  Chronically ill Barriers to Discharge:  No Barriers Identified   Ihor Gully, LCSW 08/10/2015, 2:24 PM

## 2015-08-10 NOTE — Progress Notes (Signed)
PROGRESS NOTE    Brent Franco  [id number] DOB: March 09, 1951 DOA: 08/09/2015 PCP: [name], NP Outpatient Specialists:  Gastroenterology; Daneil Dolin, MD   Brief Narrative:  46 yom with a hx of COPD with supplemental O2 at home, depression, anxiety, Hep C, and esophageal cancer, presented with complaints of confusion, increased cough, and hypoxia. He has had multiple recent hospitalizations, including HCAP 3 weeks ago. Patient improved with Vanc and Zosyn, and did well from a respiration standpoint. He was seen at the cancer center on 6/01 for a regularly scheduled appointment and was found to be confused with O2 sats at 86%. He also reported a cough onset 5/31. CXR done in ED was notable for persistent PNA. Pt was placed on BiPAP in the ED, treated with nebs, cefepime and Vanc. Pt was admitted to stepdown for management of acute encephalopathy suspected secondary to hypercarbia in the setting of HCAP and chronic respiratory failure.   Assessment & Plan:   Principal Problem:   Altered mental status Active Problems:   COPD (chronic obstructive pulmonary disease) (HCC)   Depression   Dysphagia   Esophageal cancer (HCC)   Diabetes mellitus without complication, with long-term current use of insulin (HCC)   HCAP (healthcare-associated pneumonia)   Acute on chronic respiratory failure with hypoxia and hypercapnia (HCC)   Anxiety   Chronic pain   Hypothyroidism   Acute encephalopathy   Hepatitis C, chronic (HCC)   Hypercarbia  1. Acute encephalopathy. Suspected secondary to hypercarbia/hypoxia, improved with BiPAP. BiPAP therapy can likely be discontinued later today. Hypercarbia is trending down. Head CT showed no acute findings. Mental status appears to be improving.  2. HCAP. With the recurrent nature of his illness, aspiration may be playing a major role. CXR showed persistent airspace consolidations. Leukocytosis has resolved. Urine strep and legionella cultures in process.  Will continue Vanc and Zosyn. Follow blood cultures. He has been scheduled for outpatient MBSS, since this cannot be done as an inpatient at this time. Discussed with Dr. Luan Pulling and will obtain CT scan of the chest.   3. COPD with chronic respiratory failure. Patient requires 2.5L PM at home. Continue breathing treatments and O2. No evidence of wheeze at this time. Will likely discontinue BiPAP therapy later today and transition to [place] 3L. 4. Acute on chronic respiratory failure with hypoxia. Patient is chronically on 2.5L home O2. He required BiPAP on admission. Worsening respiratory status likely related to PNA. Wean back to baseline O2 requirement as tolerated.  5. Esophageal cancer with suspected metastases. Reported suspicion for liver metastasis. PET scheduled for 6/5. Continue management per oncology team.  6. Depression, anxiety. Stable. Continue Prozac and PRN Xanax.  7. Hypothyroidism. TSH normal. Continue synthroid.  8. Normocytic anemia. No sign of active blood-loss. Hgb stable.  9. Chronic pain. Continue management PRN.  10. Chronic Hepatitis C. Patient has approximately 2 weeks left of treatment with Harvoni. Will continue.   DVT prophylaxis: Lovenox Code Status: Full Family Communication: discussed with patient. No family present at bedside.  Disposition Plan: discharge home once improved   Consultants:   pulmonology  Procedures:   BiPAP 6/01  Antimicrobials:   Vanc 6/01 >>  Zosyn 6/01 >>  Subjective: Feels okay today.   Objective: Filed Vitals:   08/10/15 0300 08/10/15 0336 08/10/15 0400 08/10/15 0500  BP:  112/72 115/79   Pulse: 82 82 81 82  Temp:   97.7 F (36.5 C)   TempSrc:   Axillary   Resp: 14 16  15 20  Height:      Weight:    74.3 kg (163 lb 12.8 oz)  SpO2: 99% 99% 98% 96%    Intake/Output Summary (Last 24 hours) at 08/10/15 0550 Last data filed at 08/10/15 0500  Gross per 24 hour  Intake   1500 ml  Output    785 ml  Net    715 ml   Filed  Weights   08/09/15 1343 08/10/15 0500  Weight: 72.576 kg (160 lb) 74.3 kg (163 lb 12.8 oz)    Examination:  General exam: Appears calm and comfortable  Respiratory system: Clear to auscultation. Respiratory effort normal. Cardiovascular system: S1 & S2 heard, RRR. No JVD, murmurs, rubs, gallops or clicks. No pedal edema. Gastrointestinal system: Abdomen is nondistended, soft and nontender. No organomegaly or masses felt. Normal bowel sounds heard. Central nervous system: Alert and oriented. No focal neurological deficits. Extremities: Symmetric 5 x 5 power. Skin: No rashes, lesions or ulcers Psychiatry: Judgement and insight appear normal. Mood & affect appropriate.     Data Reviewed: I have personally reviewed following labs and imaging studies  CBC:  Recent Labs Lab 08/09/15 1442  WBC 12.4*  NEUTROABS 9.9*  HGB 11.4*  HCT 36.3*  MCV 97.3  PLT [id number]   Basic Metabolic Panel:  Recent Labs Lab 08/09/15 1442  NA 136  K 4.2  CL 98*  CO2 31  GLUCOSE 192*  BUN 17  CREATININE 1.16  CALCIUM 8.5*   GFR: Estimated Creatinine Clearance: 68.5 mL/min (by C-G formula based on Cr of 1.16). Liver Function Tests:  Recent Labs Lab 08/09/15 1442  AST 50*  ALT 30  ALKPHOS 162*  BILITOT 0.7  PROT 7.6  ALBUMIN 3.1*    Recent Labs Lab 08/09/15 1441  AMMONIA 29   Cardiac Enzymes:  Recent Labs Lab 08/09/15 1442  TROPONINI <0.03    CBG:  Recent Labs Lab 08/09/15 1835 08/09/15 2129  GLUCAP 141* 105*   Lipid Profile: No results for input(s): CHOL, HDL, LDLCALC, TRIG, CHOLHDL, LDLDIRECT in the last 72 hours. Thyroid Function Tests:  Recent Labs  08/09/15 1710  TSH 1.064   Anemia Panel: No results for input(s): VITAMINB12, FOLATE, FERRITIN, TIBC, IRON, RETICCTPCT in the last 72 hours. Urine analysis:    Component Value Date/Time   COLORURINE YELLOW 08/09/2015 1500   APPEARANCEUR CLEAR 08/09/2015 1500   LABSPEC 1.020 08/09/2015 1500   PHURINE 5.5  08/09/2015 1500   GLUCOSEU NEGATIVE 08/09/2015 1500   HGBUR NEGATIVE 08/09/2015 1500   BILIRUBINUR NEGATIVE 08/09/2015 1500   KETONESUR NEGATIVE 08/09/2015 1500   PROTEINUR NEGATIVE 08/09/2015 1500   UROBILINOGEN 0.2 08/23/2014 1321   NITRITE NEGATIVE 08/09/2015 1500   LEUKOCYTESUR NEGATIVE 08/09/2015 1500   Sepsis Labs: @LABRCNTIP (procalcitonin:4,lacticidven:4)  ) Recent Results (from the past 240 hour(s))  MRSA PCR Screening     Status: None   Collection Time: 08/09/15  6:05 PM  Result Value Ref Range Status   MRSA by PCR NEGATIVE NEGATIVE Final    Comment:        The GeneXpert MRSA Assay (FDA approved for NASAL specimens only), is one component of a comprehensive MRSA colonization surveillance program. It is not intended to diagnose MRSA infection nor to guide or monitor treatment for MRSA infections.      Radiology Studies: Dg Chest 2 View  08/09/2015  CLINICAL DATA:  Weakness. History of esophageal cancer. Recently treated for pneumonia. EXAM: CHEST  2 VIEW COMPARISON:  07/16/2015 FINDINGS: Right-sided injectable port is in  stable position. Cardiomediastinal silhouette is normal. Mediastinal contours appear intact. There is no evidence of pleural effusion or pneumothorax. There are persistent areas of subtle airspace consolidation in the left lung base and less so in the right lung base. Osseous structures are without acute abnormality. Soft tissues are grossly normal. IMPRESSION: Persistent airspace consolidation in the left lung base and less so in the right lung base concerning for ongoing pneumonia. Electronically Signed   By: Fidela Salisbury M.D.   On: 08/09/2015 14:26   Ct Head Wo Contrast  08/09/2015  CLINICAL DATA:  Altered mental status.  Esophageal carcinoma. EXAM: CT HEAD WITHOUT CONTRAST TECHNIQUE: Contiguous axial images were obtained from the base of the skull through the vertex without intravenous contrast. COMPARISON:  None. FINDINGS: The ventricles are  normal in size and configuration. There is no intracranial mass, hemorrhage, extra-axial fluid collection, or midline shift. The gray-white compartments appear normal. No acute infarct evident. The bony calvarium appears intact. The mastoid air cells are clear. Orbits appear symmetric bilaterally. IMPRESSION: Study within normal limits. Electronically Signed   By: Lowella Grip III M.D.   On: 08/09/2015 15:57    Scheduled Meds: . docusate sodium  100 mg Oral BID  . enoxaparin (LOVENOX) injection  40 mg Subcutaneous Q24H  . FLUoxetine  20 mg Oral Daily  . insulin aspart  0-15 Units Subcutaneous TID WC  . Ledipasvir-Sofosbuvir  1 tablet Oral Daily  . levothyroxine  25 mcg Oral QAC breakfast  . oxyCODONE  20 mg Oral Q8H  . piperacillin-tazobactam (ZOSYN)  IV  3.375 g Intravenous Q8H  . tiotropium  18 mcg Inhalation Daily  . vancomycin  1,000 mg Intravenous Q8H   Continuous Infusions:       Time spent: 25 minutes  Kathie Dike, MD [facility] Pager [phone]  If 7PM-7AM, please contact night-coverage [email or web address] Password [email or web address] 08/10/2015, 5:50 AM   By signing my name below, I, Delene Ruffini, attest that this documentation has been prepared under the direction and in the presence of Kathie Dike, MD. Electronically Signed: Delene Ruffini 08/10/2015 7:50am  I, Dr. Kathie Dike, personally performed the services described in this documentaiton. All medical record entries made by the scribe were at my direction and in my presence. I have reviewed the chart and agree that the record reflects my personal performance and is accurate and complete  Kathie Dike, MD, 08/10/2015 8:06 AM

## 2015-08-10 NOTE — Clinical Documentation Improvement (Signed)
Internal Medicine  Noted nutritional consult on case, please review and if appropriate provide a diagnosis.  Thank you   Document Severity - Severe(third degree), Moderate (second degree), Mild (first degree)  Form - [place] (rarely seen in the [place], Other Condition, Unable to Determine  Other condition  Unable to clinically determine  Document any associated diagnoses/conditions   Supporting Information: "Non-severe (moderate) malnutrition in context of chronic illness"  NUTRITION DIAGNOSIS:  Increased nutrient needs related to chronic illness, acute illness, Wt gain as evidenced by estimated nutritional requirements  BMI 21.62  Please exercise your independent, professional judgment when responding. A specific answer is not anticipated or expected.   Thank You, [place] [phone]

## 2015-08-10 NOTE — Care Management Note (Addendum)
Case Management Note  Patient Details  Name: Brent Franco MRN: [id number] Date of Birth: [birth year]  Subjective/Objective:                  Pt is from home, lives with wife. Pt ind at baseline. Pt has home O2 PTA. Pt's wife at bedside and pt currently agitated with AMS.  At this point plan to return home with self care.   Action/Plan: Will cont to follow for DC planning.   Expected Discharge Date:     08/10/2015             Expected Discharge Plan:  Home/Self Care  In-House Referral:  NA  Discharge planning Services  CM Consult  Post Acute Care Choice:  NA Choice offered to:  NA  DME Arranged:    DME Agency:     HH Arranged:    HH Agency:     Status of Service:  In process, will continue to follow  Medicare Important Message Given:    Date Medicare IM Given:    Medicare IM give by:    Date Additional Medicare IM Given:    Additional Medicare Important Message give by:     If discussed at [place] of Stay Meetings, dates discussed:    Additional Comments:  Sherald Barge, RN 08/10/2015, 11:44 AM

## 2015-08-10 NOTE — Progress Notes (Addendum)
Speech Language Pathology Evaluation: Dysphagia  Patient Details Name: Brent Franco MRN: [id number] DOB: 03/02/1951 Today's Date: 08/10/2015 Time: [id number] SLP Time Calculation (min) (ACUTE ONLY): 26 min  Assessment / Plan / Recommendation Clinical Impression  Pt was evaluated at bedside observed with thin liquids and nectar thick liquids. SLP notes increased confusion as evidenced by pt reporting emphatically that he knows the therapist and saw her here yesterday (which is untrue). Pt presents with increased generalized oral weakness and with thin liquids presents with a faint wetness/rattle after the swallow cleared with additional swallows and an inconsistent audible swallow.  Pt demonstrated no overt s/sx of aspiration with nectar thick liquids. Pt reports that family "puree's" his food at home (evaluating therapist obtained information from family that this texture is more ground than puree). D/t compromised respiratory status (pt has been on BiPAP), hx of esophageal cancer, increased oral/generalized weakness, increased confusion resulting in pt's inability to consistently follow safe swalllowing strategies and safe postural positioning recommend downgrade to [place] (NTL) and D2/ground textures. SLP will follow while in acute setting for possible diet upgrade as clinically appropriate.   HPI HPI: Brent Franco 65 y.o. male returns for followup of poorly differentiated esophageal carcinoma (unable to identify squamous cell versus adenocarcinoma) with radiographic evidence of possible metastatic disease to liver and lymph nodes, with esophageal lesion beginning at 35 cm from incisors extending down to 39 cm; approximately 1 cm superior to GE junction. Complicated by Hepatitis C without treatment. Now currently ~ 5-6 weeks into Harvoni treatment. PET scan is scheduled for 08/13/2015. There is concern for liver involvement from his esophageal cancer and hopefully this upcoming PET scan can  help in determining this. Patient's wife reports decreased oral fluid intake, reporting only one 16 oz bottle of water per day. Due to poor oral intake of fluids, pt receiving IV NS.He was admitted to hospital for PNA 4 times since December per pt's wife Brent Franco and Brent Franco) and this is his first referral to SLP. Pt referred for clinical swallow evaluation by Dr. Whitney Muse and Kirby Crigler. He is followed by Dr. Isidore Moos for radiation oncology. Wife reports weight loss of ~9 pounds since diagnosis.      SLP Plan  Continue with current plan of care     Recommendations  Diet recommendations: Nectar-thick liquid;Dysphagia 2 (fine chop) Liquids provided via: Cup Medication Administration: Whole meds with liquid Supervision: Patient able to self feed Compensations: Minimize environmental distractions;Slow rate;Small sips/bites;Multiple dry swallows after each bite/sip;Follow solids with liquid;Clear throat intermittently Postural Changes and/or Swallow Maneuvers: Seated upright 90 degrees            Oral Care Recommendations: Oral care BID Plan: Continue with current plan of care     Anacristina Steffek H. Roddie Mc, CCC-SLP Speech Language Pathologist    Wende Bushy 08/10/2015, 4:16 PM
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
## 2015-08-10 NOTE — Evaluation (Signed)
Occupational Therapy Evaluation Patient Details Name: Brent Franco MRN: [id number] DOB: 07-06-1950 Today's Date: 08/10/2015    History of Present Illness Brent Franco is a 65 y.o. 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 Patient has had multiple recent hospital admissions, including for HCAP 3 weeks ago, and has had significant decline in his overall health. He was seen today at the cancer center for regularly scheduled appointment, was found to be confused and saturating 86%, and was directed to the emergency department. Per report, his portable oxygen tank was empty on his 30-minute ride to the [facility], but his home concentrator has been working appropriately. Patient improved on vancomycin and Zosyn during the recent hospital admission for pneumonia and his cultures remain negative. He initially did okay from a respiratory standpoint upon returning home, but has had increased cough since last night, as well as confusion despite using this oxygen concentrator at home. He denies any chest pain, palpitations, headaches, focal numbness or weakness, or loss of coordination. He also denies abdominal pain, nausea, vomiting, or diarrhea. He denies any recent falls or trauma.   Clinical Impression   Pt awake, alert, oriented x3 this am, beginning to eat breakfast when OT arrived. Evaluation completed at bed level, per pt reports wife assists with all ADL tasks to some degree. Pt able to complete oral denture placement at bed-level with set-up. Wife not present to confirm or clarify pt level of participation during daily tasks. Recommend continued acute OT services to further assess participation/functional level during ADL and functional mobility tasks.     Follow Up  Recommendations  Other (comment) (TBD)    Equipment Recommendations  None recommended by OT       Precautions / Restrictions Precautions Precautions: Fall Restrictions Weight Bearing Restrictions: No      Mobility Bed Mobility               General bed mobility comments: not tested  Transfers                 General transfer comment: not tested         ADL Overall ADL's : Needs assistance/impaired Eating/Feeding: Set up;Bed level   Grooming: Oral care;Set up;Bed level (denture placement)                                 General ADL Comments: Per pt reports, wife assists with all ADL tasks at home due to weakness and fatigue     Vision Vision Assessment?: No apparent visual deficits          Pertinent Vitals/Pain Pain Assessment: No/denies pain     Hand Dominance Right   Extremity/Trunk Assessment Upper Extremity Assessment Upper Extremity Assessment: Generalized weakness   Lower Extremity Assessment Lower Extremity Assessment: Defer to PT evaluation       Communication Communication Communication: No difficulties   Cognition Arousal/Alertness: Awake/alert Behavior During Therapy: WFL for tasks assessed/performed Overall Cognitive Status: Difficult to assess                 General Comments: no family present to confirm or correct pt reports              Home Living Family/patient expects to be discharged to::  Private residence Living Arrangements: Spouse/significant other Available Help at Discharge: Family Type of Home: House             Bathroom Shower/Tub: Walk-in Corporate treasurer Toilet: [place]: Environmental consultant - 2 wheels;Bedside commode;Shower seat;Hand held shower head          Prior Functioning/Environment Level of Independence: Needs assistance  Gait / Transfers Assistance Needed: Pt reports use of a 2 wheeled walker during functional mobility and community mobility  tasks ADL's / Homemaking Assistance Needed: Pt reports wife assists with all ADL tasks including dressing, bathing, grooming, toileting as well as IADL tasks including meal preparation, medication management, and driving        OT Diagnosis: Generalized weakness   OT Problem List: Decreased strength;Decreased activity tolerance;Decreased safety awareness   OT Treatment/Interventions: Self-care/ADL training;Therapeutic exercise;Therapeutic activities;Patient/family education    OT Goals(Current goals can be found in the care plan section) Acute Rehab OT Goals Patient Stated Goal: To go home OT Goal Formulation: With patient Time For Goal Achievement: 08/23/2015 Potential to Achieve Goals: Good  OT Frequency: Min 2X/week    End of Session    Activity Tolerance: Patient limited by fatigue Patient left: in bed;with call bell/phone within reach   Time: 0821-0837 OT Time Calculation (min): 16 min Charges:  OT General Charges $OT Visit: 1 Procedure OT Evaluation $OT Eval Low Complexity: 1 Procedure G-Codes: OT G-codes **NOT FOR INPATIENT CLASS** Functional Assessment Tool Used: clinical judgement Functional Limitation: Self care Self Care Current Status [id number]): At least 60 percent but less than 80 percent impaired, limited or restricted Self Care Goal Status [id number]): At least 60 percent but less than 80 percent impaired, limited or restricted  Guadelupe Sabin, OTR/L  [phone]  08/10/2015, 9:48 AM

## 2015-08-10 NOTE — Progress Notes (Signed)
Initial Nutrition Assessment  DOCUMENTATION CODES:  Non-severe (moderate) malnutrition in context of chronic illness  INTERVENTION:  Soft Diet per SLP recommendations  Boost Breeze po TID, each supplement provides 250 kcal and 9 grams of protein  NUTRITION DIAGNOSIS:  Increased nutrient needs related to chronic illness, acute illness, Wt gain as evidenced by estimated nutritional requirements GOAL:  Patient will meet greater than or equal to 90% of their needs  MONITOR:  PO intake, Supplement acceptance, Labs  REASON FOR ASSESSMENT:  Consult Assessment of nutrition requirement/status  ASSESSMENT:  65 y/o male PMHx COPD, ETOH abuse, depression, Hep C, DM and esophageal cancer s/p chemoradiation. Presents with AMS, Cough, Hypoxia . admitted for  evaluation and management of acute encephalopathy suspected 2/2 hypercarbia in the setting of HCAP with underlying COPD and chronic hypoxic respiratory failure.  Pt is [facility] patient and fairly well known to this RD. He was seen at his last hospital admission 3 weeks ago. He completed chemoradiation, doing exceptionally well. He only lost 7 lbs. Today he is down another 5 lbs. He has had ongoing trouble with PNA.   During last admission, pt had reported food "getting stuck" and RD had reccommended swallow eval inpatient or shortly following discharge. Unfortunately, this was not done and he was only was evaluated 2 days ago. Per discussion with SLP, she suspects that is PNA may, in fact, be related to ongoing aspiration  and she reccommended an MBS at [facility] Monday as the one at [facility] is down.  His dysphagia was suspected to be resulting from his esophageal cancer therapy.   Wife was surprised that pt had lost 5 lbs. She states that prior to last week, pt was eating "like he was normal again". RD gave other potential reasons for apparent loss. Regarding pr's PO intake, She gives a history of the past 3 weeks as "up and down". He would eat good one week  and not the next and so on. His Daughter "Force Fed" him. He would eat at least 2 meals a day.   We discussed SLPs evaluation and her recommendations. They now know the importance of expelling sputum vs swallowing and good oral hygiene. At home, pts daughter was using the a "food ninja" to prepare ground foods.  No nausea/vomiting. Intermittent constipation. RD explains importance of fluid and concern that he is becoming dehydrated at home.   They briefly kept a food log in attempt to get pt >2000 kcals daily. Explained that his requirements may actually be slightly higher. Pt states he was thinking of taking a mvi to help his appetite "like they say on TV". 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   Pt still taking Harvoni. On last visit, he had thought this was making him feel awful, but now attributes his malaise to other reasons and he has been able to tolerate the Harvoni well.   Pt has stopped drinking Ensure/Boost recently. Was going to offer Mighty shake, however He has noticed that milk and other dairy products make his excretions worse. Acknowledged that dairy can thicken mucus. When asked how much worse he states "up there with a lot worse". He has not tried the Clear supplements. Will order these.   Encouraged him to drink more fluids and to prioritize high kcal beverages/foods. Asked what he would like for Dinner, but said his daughter would bring him something.  NFPE: Subjectively, Collar bone more pronounced and interosseous muscles more depleted, but he was seen lying down at last encounter. Mild-MOd muscle/fat wasting.   Labs reviewed: Albumin: 2.8, Alk phos: 143   Recent Labs Lab 08/09/15 1442 08/10/15 0436  NA 136 138  K 4.2 4.0  CL 98* 100*  CO2 31 33*  BUN 17 12  CREATININE 1.16 0.96  CALCIUM 8.5* 8.5*  GLUCOSE 192* 101*    Diet Order:  Diet Carb Modified  Fluid consistency:: Thin; Room service appropriate?: Yes; Fluid restriction:: 1500 mL Fluid  Skin: Dry  Last BM:  Unknown  Height:  Ht Readings from Last 1 Encounters:  08/09/15 _0  (1.854 m)   Weight:  Wt Readings from Last 1 Encounters:  08/10/15 163 lb 12.8 oz (74.3 kg)   Wt Readings from Last 10 Encounters:  08/10/15 163 lb 12.8 oz (74.3 kg)  08/01/15 160 lb 3.2 oz (72.666 kg)  07/25/15 158 lb 3.2 oz (71.759 kg)  07/24/15 159 lb 3.2 oz (72.213 kg)  07/16/15 168 lb (76.204 kg)  07/07/15 168 lb (76.204 kg)  07/04/15 169 lb 1.6 oz (76.703 kg)  06/13/15 168 lb 3.2 oz (76.295 kg)  06/11/15 170 lb 3.2 oz (77.202 kg)  06/04/15 168 lb 3.2 oz (76.295 kg)   Ideal Body Weight:  83.6 kg  BMI:  Body mass index is 21.62 kg/(m^2).  Estimated Nutritional Needs:  Kcal:  2150-2350 (29-32 kcal/kg bw) Protein:  89-104g (1.2-1.4 g/kg bw) Fluid:  >2.2 liters  EDUCATION NEEDS:  Education needs addressed  Burtis Junes RD, LDN, CNSC Clinical Nutrition Pager: [phone] 08/10/2015 2:55 PM

## 2015-08-10 NOTE — Consult Note (Signed)
Consult requested by: [facility] hospitalist Consult requested for recurrent pneumonia/respiratory failure  HPI: This is a 65 year old who has multiple medical problems including esophageal cancer COPD with chronic respiratory failure on home oxygen recurrent confusion and recurrent pneumonia. He was hospitalized with healthcare associated pneumonia about 3 weeks ago. Chest x-ray shows persistent pneumonia. He was started on BiPAP in the emergency department started on intravenous antibiotics. He has had multiple similar bouts. There is concern that he is aspirating and he is scheduled for a modified barium swallow at [facility] next week. He has not had overt aspiration. His esophageal cancer is thought to be metastatic. Arterial blood gas showed he was hypoxic and had respiratory acidosis.  Past Medical History  Diagnosis Date  . COPD (chronic obstructive pulmonary disease) ([place])   . ETOH abuse     stopped 10 years ago  . Depression     suicide attempt 20 years ago  . Arthritis   . Asthma   . Viral hepatitis C   . Diabetes mellitus without complication ([place])   . Hypothyroidism   . Cancer ([place])     esophageal  . Pneumonia 02/2015  . Itching     both feet, known has athlet's feet now  . Esophageal cancer ([place]) 04/03/2015  . Pulmonary nodules 07/09/2015  . On home O2      Family History  Problem Relation Age of Onset  . Pancreatic cancer Mother   . Lung cancer Father   . Colon cancer Neg Hx      Social History   Social History  . Marital Status: Married    Spouse Name: N/A  . Number of Children: N/A  . Years of Education: N/A   Social History Main Topics  . Smoking status: Former Smoker -- 1.50 packs/day for 47 years    Types: Cigarettes    Start date: 12/03/1966    Quit date: 12/08/2013  . Smokeless tobacco: Never Used  . Alcohol Use: No     Comment: Quit 2006: previously drank heavily. Had 1 drink 2-3 years ago (2013-2014)  . Drug Use: No     Comment: Previous IV drug  use as teenager, none in 40 years. Last marijuanna use 02/22/15.  Marland Kitchen Sexual Activity: Yes   Other Topics Concern  . None   Social History Narrative     ROS: Not obtainable as he is on BiPAP. Information on history and physical is reviewed    Objective: Vital signs in last 24 hours: Temp:  [97.4 F (36.3 C)-98.3 F (36.8 C)] 97.4 F (36.3 C) (06/02 0800) Pulse Rate:  [73-98] 93 (06/02 0700) Resp:  [13-24] 17 (06/02 0700) BP: (93-120)/(60-86) 114/72 mmHg (06/02 0600) SpO2:  [93 %-100 %] 98 % (06/02 0700) FiO2 (%):  [35 %] 35 % (06/02 0400) Weight:  [72.576 kg (160 lb)-74.3 kg (163 lb 12.8 oz)] 74.3 kg (163 lb 12.8 oz) (06/02 0500) Weight change:  Last BM Date:  (PTA)  Intake/Output from previous day: 06/01 0701 - 06/02 0700 In: 1500 [I.V.:1000; IV Piggyback:500] Out: 785 [Urine:785]  PHYSICAL EXAM He is awake and alert and looks fairly comfortable but on BiPAP. His pupils are reactive nose and throat are clear mucous members are moist he has rhonchi bilaterally. His heart is regular without gallop. His abdomen is soft and he does not have any clubbing cyanosis or edema. Central nervous system exam grossly intact  Lab Results: Basic Metabolic Panel:  Recent Labs  08/09/15 1442 08/10/15 0436  NA 136  138  K 4.2 4.0  CL 98* 100*  CO2 31 33*  GLUCOSE 192* 101*  BUN 17 12  CREATININE 1.16 0.96  CALCIUM 8.5* 8.5*   Liver Function Tests:  Recent Labs  08/09/15 1442 08/10/15 0436  AST 50* 41  ALT 30 25  ALKPHOS 162* 143*  BILITOT 0.7 0.7  PROT 7.6 7.2  ALBUMIN 3.1* 2.8*   No results for input(s): LIPASE, AMYLASE in the last 72 hours.  Recent Labs  08/09/15 1441  AMMONIA 29   CBC:  Recent Labs  08/09/15 1442 08/10/15 0436  WBC 12.4* 7.9  NEUTROABS 9.9* 5.7  HGB 11.4* 10.8*  HCT 36.3* 34.8*  MCV 97.3 98.3  PLT 159 153   Cardiac Enzymes:  Recent Labs  08/09/15 1442  TROPONINI <0.03   BNP: No results for input(s): PROBNP in the last 72  hours. D-Dimer: No results for input(s): DDIMER in the last 72 hours. CBG:  Recent Labs  08/09/15 1835 08/09/15 2129 08/10/15 0723  GLUCAP 141* 105* 108*   Hemoglobin A1C: No results for input(s): HGBA1C in the last 72 hours. Fasting Lipid Panel: No results for input(s): CHOL, HDL, LDLCALC, TRIG, CHOLHDL, LDLDIRECT in the last 72 hours. Thyroid Function Tests:  Recent Labs  08/09/15 1710  TSH 1.064   Anemia Panel: No results for input(s): VITAMINB12, FOLATE, FERRITIN, TIBC, IRON, RETICCTPCT in the last 72 hours. Coagulation: No results for input(s): LABPROT, INR in the last 72 hours. Urine Drug Screen: Drugs of Abuse     Component Value Date/Time   LABOPIA POSITIVE* 08/09/2015 1500   COCAINSCRNUR NONE DETECTED 08/09/2015 1500   LABBENZ POSITIVE* 08/09/2015 1500   AMPHETMU NONE DETECTED 08/09/2015 1500   THCU NONE DETECTED 08/09/2015 1500   LABBARB NONE DETECTED 08/09/2015 1500    Alcohol Level:  Recent Labs  08/09/15 [place] <5   Urinalysis:  Recent Labs  08/09/15 1500  COLORURINE YELLOW  LABSPEC 1.020  PHURINE 5.5  GLUCOSEU NEGATIVE  HGBUR NEGATIVE  BILIRUBINUR NEGATIVE  KETONESUR NEGATIVE  PROTEINUR NEGATIVE  NITRITE NEGATIVE  LEUKOCYTESUR NEGATIVE   Misc. Labs:   ABGS:  Recent Labs  08/09/15 1835  PHART 7.401  PO2ART 90.4  HCO3 29.5*     MICROBIOLOGY: Recent Results (from the past 240 hour(s))  MRSA PCR Screening     Status: None   Collection Time: 08/09/15  6:05 PM  Result Value Ref Range Status   MRSA by PCR NEGATIVE NEGATIVE Final    Comment:        The GeneXpert MRSA Assay (FDA approved for NASAL specimens only), is one component of a comprehensive MRSA colonization surveillance program. It is not intended to diagnose MRSA infection nor to guide or monitor treatment for MRSA infections.     Studies/Results: Dg Chest 2 View  08/09/2015  CLINICAL DATA:  Weakness. History of esophageal cancer. Recently treated for  pneumonia. EXAM: CHEST  2 VIEW COMPARISON:  07/16/2015 FINDINGS: Right-sided injectable port is in stable position. Cardiomediastinal silhouette is normal. Mediastinal contours appear intact. There is no evidence of pleural effusion or pneumothorax. There are persistent areas of subtle airspace consolidation in the left lung base and less so in the right lung base. Osseous structures are without acute abnormality. Soft tissues are grossly normal. IMPRESSION: Persistent airspace consolidation in the left lung base and less so in the right lung base concerning for ongoing pneumonia. Electronically Signed   By: Fidela Salisbury M.D.   On: 08/09/2015 14:26   Ct  Head Wo Contrast  08/09/2015  CLINICAL DATA:  Altered mental status.  Esophageal carcinoma. EXAM: CT HEAD WITHOUT CONTRAST TECHNIQUE: Contiguous axial images were obtained from the base of the skull through the vertex without intravenous contrast. COMPARISON:  None. FINDINGS: The ventricles are normal in size and configuration. There is no intracranial mass, hemorrhage, extra-axial fluid collection, or midline shift. The gray-white compartments appear normal. No acute infarct evident. The bony calvarium appears intact. The mastoid air cells are clear. Orbits appear symmetric bilaterally. IMPRESSION: Study within normal limits. Electronically Signed   By: Lowella Grip III M.D.   On: 08/09/2015 15:57    Medications:  Prior to Admission:  Prescriptions prior to admission  Medication Sig Dispense Refill Last Dose  . ACCU-CHEK AVIVA PLUS test strip 2 (two) times daily. use for testing  2 08/09/2015 at Unknown time  . acetaminophen (TYLENOL) 500 MG tablet Take 500 mg by mouth every 6 (six) hours as needed for fever.   08/08/2015 at Unknown time  . albuterol (PROVENTIL HFA;VENTOLIN HFA) 108 (90 BASE) MCG/ACT inhaler Inhale 1-2 puffs into the lungs every 6 (six) hours as needed for wheezing or shortness of breath.    08/08/2015 at Unknown time  .  ALPRAZolam (XANAX) 1 MG tablet Take 1 tablet (1 mg total) by mouth every 8 (eight) hours as needed for anxiety. 90 tablet 1 08/08/2015 at Unknown time  . docusate sodium (COLACE) 100 MG capsule Take 100 mg by mouth 2 (two) times daily.  4 08/09/2015 at Unknown time  . FLUoxetine (PROZAC) 20 MG capsule Take 1 capsule (20 mg total) by mouth daily. 30 capsule 3 08/09/2015 at Unknown time  . HUMULIN R 100 UNIT/ML injection Inject 2-12 Units into the skin 2 (two) times daily as needed for high blood sugar. 151-200 = 2 units, 201-250 = 4 units, 251-300 = 6 units, 301-350 = 8 units, 351-400 = 10 units, 401-450 = 12 units.  2 Past Week at Unknown time  . ipratropium-albuterol (DUONEB) 0.5-2.5 (3) MG/3ML SOLN INHALE CONTENTS OF 1 VIAL IN NEBULIZER EVERY 4 HOURS AS NEEDED FOR SHORTNESS OF BREATH.  3 08/08/2015 at Unknown time  . Ledipasvir-Sofosbuvir (HARVONI) 90-400 MG TABS Take 1 tablet by mouth daily.    08/09/2015 at Unknown time  . levothyroxine (SYNTHROID, LEVOTHROID) 25 MCG tablet Take 25 mcg by mouth daily.  5 08/09/2015 at Unknown time  . lidocaine (XYLOCAINE) 2 % solution Reported on 07/24/2015  3 Past Month at Unknown time  . lidocaine-prilocaine (EMLA) cream Apply a quarter size amount to port site 1 hour prior to chemo. Do not rub in. Cover with plastic wrap. 30 g 3 08/09/2015 at Unknown time  . meloxicam (MOBIC) 7.5 MG tablet TAKE ONE TABLET BY MOUTH ONCE DAILY  6 08/09/2015 at Unknown time  . metFORMIN (GLUCOPHAGE) 500 MG tablet Take 500 mg by mouth 2 (two) times daily.  3 08/09/2015 at Unknown time  . nitroGLYCERIN (NITROSTAT) 0.4 MG SL tablet Place 1 tablet (0.4 mg total) under the tongue every 5 (five) minutes as needed for chest pain. 25 tablet 3 unknown  . ondansetron (ZOFRAN) 8 MG tablet TAKE 1 TABLET (8 MG TOTAL) BY MOUTH EVERY 8 (EIGHT) HOURS AS NEEDED FOR NAUSEA OR VOMITING. 30 tablet 2 08/09/2015 at Unknown time  . oxyCODONE (OXYCONTIN) 20 mg 12 hr tablet Take 1 tablet (20 mg total) by mouth every 8 (eight)  hours. 90 tablet 0 08/08/2015 at Unknown time  . oxyCODONE-acetaminophen (PERCOCET) 10-325 MG tablet Take  1 tablet by mouth every 4 (four) hours as needed for pain. 90 tablet 0 08/08/2015 at Unknown time  . prochlorperazine (COMPAZINE) 10 MG tablet TAKE 1 TABLET (10 MG TOTAL) BY MOUTH EVERY 6 (SIX) HOURS AS NEEDED FOR NAUSEA OR VOMITING. 30 tablet 2 Past Week at Unknown time  . SPIRIVA HANDIHALER 18 MCG inhalation capsule Place 1 puff into inhaler and inhale daily.  5 08/08/2015 at Unknown time  . sucralfate (CARAFATE) 1 g tablet Take 1 tablet by mouth 2 (two) times daily as needed (throat pain).   5 Past Month at Unknown time  . tetrahydrozoline 0.05 % ophthalmic solution Place 1 drop into both eyes daily as needed (dry eyes).   unknown   Scheduled: . docusate sodium  100 mg Oral BID  . enoxaparin (LOVENOX) injection  40 mg Subcutaneous Q24H  . FLUoxetine  20 mg Oral Daily  . guaiFENesin  1,200 mg Oral BID  . insulin aspart  0-15 Units Subcutaneous TID WC  . ipratropium-albuterol  3 mL Nebulization Q6H  . Ledipasvir-Sofosbuvir  1 tablet Oral Daily  . levothyroxine  25 mcg Oral QAC breakfast  . oxyCODONE  20 mg Oral Q8H  . piperacillin-tazobactam (ZOSYN)  IV  3.375 g Intravenous Q8H  . vancomycin  1,000 mg Intravenous Q8H   Continuous:  [id number], albuterol, ALPRAZolam, nitroGLYCERIN, ondansetron, oxyCODONE-acetaminophen **AND** oxyCODONE, polyvinyl alcohol, prochlorperazine  Assesment: He has altered mental status likely related to his acute illness. I agree he should be treated for healthcare associated pneumonia and I agree that it is most likely that this is related to aspiration. Certainly with a history of esophageal cancer he may have motility issues that would make him more likely to aspirate. He has pretty severe COPD at baseline has chronic hypoxic respiratory failure. He now has acute on chronic hypoxic and hypercapnic respiratory failure and he seems to be responding pretty  well to BiPAP which I would continue and see if he can come off later today. Principal Problem:   Altered mental status Active Problems:   COPD (chronic obstructive pulmonary disease) (HCC)   Depression   Dysphagia   Esophageal cancer (HCC)   Diabetes mellitus without complication, with long-term current use of insulin (HCC)   HCAP (healthcare-associated pneumonia)   Acute on chronic respiratory failure with hypoxia and hypercapnia (HCC)   Anxiety   Chronic pain   Hypothyroidism   Acute encephalopathy   Hepatitis C, chronic (HCC)   Hypercarbia    Plan: As discussed with Dr. Barbaraann Faster continue BiPAP see if he can come off later today and I think it may be helpful to get a CT. If we see debris in his airways then I think we can make a pretty definitive diagnosis of aspiration regardless of not being able to get a modified barium swallow at our facility right now. Continue IV antibiotics. Add steroids. He is high-risk to require intubation and mechanical ventilation      Kerri Kovacik L 08/10/2015, 8:30 AM

## 2015-08-11 DIAGNOSIS — J438 Other emphysema: Secondary | ICD-10-CM

## 2015-08-11 DIAGNOSIS — C155 Malignant neoplasm of lower third of esophagus: Secondary | ICD-10-CM

## 2015-08-11 LAB — URINE CULTURE: Culture: NO GROWTH

## 2015-08-11 LAB — GLUCOSE, CAPILLARY
GLUCOSE-CAPILLARY: 155 mg/dL — AB (ref 65–99)
GLUCOSE-CAPILLARY: 240 mg/dL — AB (ref 65–99)
GLUCOSE-CAPILLARY: 243 mg/dL — AB (ref 65–99)
GLUCOSE-CAPILLARY: 188 mg/dL — AB (ref 65–99)

## 2015-08-11 LAB — CBC
HEMATOCRIT: 34.4 % — AB (ref 39.0–52.0)
HEMOGLOBIN: 11.2 g/dL — AB (ref 13.0–17.0)
MCH: 31.2 pg (ref 26.0–34.0)
MCHC: 32.6 g/dL (ref 30.0–36.0)
MCV: 95.8 fL (ref 78.0–100.0)
Platelets: 157 10*3/uL (ref 150–400)
RBC: 3.59 MIL/uL — AB (ref 4.22–5.81)
RDW: 13.3 % (ref 11.5–15.5)
WBC: 9.3 10*3/uL (ref 4.0–10.5)

## 2015-08-11 LAB — LEGIONELLA PNEUMOPHILA SEROGP 1 UR AG: L. pneumophila Serogp 1 Ur Ag: NEGATIVE

## 2015-08-11 LAB — BASIC METABOLIC PANEL
Anion gap: 7 (ref 5–15)
BUN: 12 mg/dL (ref 6–20)
CHLORIDE: 98 mmol/L — AB (ref 101–111)
CO2: 32 mmol/L (ref 22–32)
Calcium: 8.6 mg/dL — ABNORMAL LOW (ref 8.9–10.3)
Creatinine, Ser: 0.82 mg/dL (ref 0.61–1.24)
GFR calc Af Amer: >60 mL/min (ref >60)
GFR calc non Af Amer: >60 mL/min (ref >60)
GLUCOSE: 124 mg/dL — AB (ref 65–99)
POTASSIUM: 4.2 mmol/L (ref 3.5–5.1)
Sodium: 137 mmol/L (ref 135–145)

## 2015-08-11 MED ORDER — IPRATROPIUM-ALBUTEROL 0.5-2.5 (3) MG/3ML IN SOLN
3.0000 mL | Freq: Three times a day (TID) | RESPIRATORY_TRACT | Status: DC
  Administered 2015-08-11 – 2015-08-13 (×6): 3 mL via RESPIRATORY_TRACT
  Filled 2015-08-11 (×6): qty 3

## 2015-08-11 MED ORDER — POLYETHYLENE GLYCOL 3350 17 G PO PACK
17.0000 g | PACK | Freq: Every day | ORAL | Status: DC
  Administered 2015-08-11 – 2015-08-12 (×2): 17 g via ORAL
  Filled 2015-08-11 (×2): qty 1

## 2015-08-11 MED ORDER — SODIUM CHLORIDE 0.9 % IV SOLN
3.0000 g | Freq: Four times a day (QID) | INTRAVENOUS | Status: DC
  Administered 2015-08-11 – 2015-08-12 (×7): 3 g via INTRAVENOUS
  Filled 2015-08-11 (×14): qty 3

## 2015-08-11 NOTE — Progress Notes (Addendum)
PROGRESS NOTE    Brent Franco  [id number] DOB: [birth year] DOA: 08/09/2015 PCP: [name], NP Outpatient Specialists:  Gastroenterology; Daneil Dolin, MD   Brief Narrative:  29 yom with a hx of COPD with supplemental O2 at home, depression, anxiety, Hep C, and esophageal cancer, presented with complaints of confusion, increased cough, and hypoxia. He has had multiple recent hospitalizations, including HCAP 3 weeks ago. Patient improved with Vanc and Zosyn, and did well from a respiration standpoint. He was seen at the cancer center on 6/01 for a regularly scheduled appointment and was found to be confused with O2 sats at 86%. He also reported a cough onset 5/31. CXR done in ED was notable for persistent PNA. Pt was placed on BiPAP in the ED, treated with nebs, cefepime and Vanc. Pt was admitted to stepdown for management of acute encephalopathy suspected secondary to hypercarbia in the setting of HCAP and chronic respiratory failure. Yesterday, patient became increasingly agitated and confused, and was requesting to speak to Dr. Whitney Muse. After speaking with Dr. Whitney Muse, patient became calm and cooperative. Dr. Donald Pore assistance is greatly appreciated.    Assessment & Plan:   Principal Problem:   Altered mental status Active Problems:   COPD (chronic obstructive pulmonary disease) (HCC)   Depression   Dysphagia   Esophageal cancer (HCC)   Diabetes mellitus without complication, with long-term current use of insulin (HCC)   HCAP (healthcare-associated pneumonia)   Acute on chronic respiratory failure with hypoxia and hypercapnia (HCC)   Anxiety   Chronic pain   Hypothyroidism   Acute encephalopathy   Hepatitis C, chronic (HCC)   Hypercarbia  1. Acute encephalopathy. Suspected secondary to hypercarbia/hypoxia, improved with BiPAP and patient was transitioned to [facility]. Initial head CT showed no acute findings. Patient became increasingly confused and agitated on 6/02. His wife  stated that he has had similar confusion during past stay in the ICU. Repeat ABG did not show any significant change. Ammonia level is within normal limits. Patient has since become calm and cooperative, but will need to continue to monitor closely. Likely had a component of ICU delirium.  2. Aspiration PNA. With the recurrent nature of his illness, aspiration may be playing a major role. CT chest showed improving bilateral PNA since prior study. Urine pneumo strep returned negative. Legionella cultures in process. Will discontinue Vancomycin and change zosyn to unasyn. Follow blood cultures. He has been scheduled for outpatient MBSS, since this cannot be done as an inpatient at this time.  3. COPD with chronic respiratory failure. Patient requires 2.5L PM at home. Continue breathing treatments and O2. No evidence of wheeze at this time. Continue [place] 3L. 4. Acute on chronic respiratory failure with hypoxia. Patient is chronically on 2.5L home O2. He required BiPAP on admission. Worsening respiratory status likely related to PNA. Wean back to baseline O2 requirement as tolerated.  5. Esophageal cancer with suspected metastases. Reported suspicion for liver metastasis. PET scheduled for 6/5. Continue management per oncology team.  6. Depression, anxiety. Stable. Continue Prozac and PRN Xanax.  7. Hypothyroidism. TSH normal. Continue synthroid.  8. Normocytic anemia. No sign of active blood-loss. Hgb stable.  9. Chronic pain. Continue management PRN.  10. Chronic Hepatitis C. Patient has approximately 2 weeks left of treatment with Harvoni. Will continue. 11. CAD. Stable   DVT prophylaxis: Lovenox Code Status: Full Family Communication: discussed with patient and wife at the bedside Disposition Plan: discharge home once improved, transfer telemetry today   Consultants:  pulmonology  Procedures:   BiPAP 6/01  Antimicrobials:   Vanc 6/01 >>  Zosyn 6/01 >>  Subjective: No confusion  overnight. Continues to have productive cough  Objective: Filed Vitals:   08/11/15 0100 08/11/15 0200 08/11/15 0300 08/11/15 0400  BP:  101/60  103/68  Pulse: 80 70 70 66  Temp:    97.4 F (36.3 C)  TempSrc:    Oral  Resp: 17 12 13 14   Height:      Weight:    73.9 kg (162 lb 14.7 oz)  SpO2: 98% 98% 99% 98%    Intake/Output Summary (Last 24 hours) at 08/11/15 0538 Last data filed at 08/11/15 0002  Gross per 24 hour  Intake   1080 ml  Output   1350 ml  Net   -270 ml   Filed Weights   08/09/15 1343 08/10/15 0500 08/11/15 0400  Weight: 72.576 kg (160 lb) 74.3 kg (163 lb 12.8 oz) 73.9 kg (162 lb 14.7 oz)    Examination:  General exam: Appears calm and comfortable  Respiratory system: bilateral rhonchi. Respiratory effort normal. Cardiovascular system: S1 & S2 heard, RRR. No JVD, murmurs, rubs, gallops or clicks. No pedal edema. Gastrointestinal system: Abdomen is nondistended, soft and nontender. No organomegaly or masses felt. Normal bowel sounds heard. Central nervous system: Alert and oriented. No focal neurological deficits. Extremities: Symmetric 5 x 5 power. Skin: No rashes, lesions or ulcers Psychiatry: Judgement and insight appear normal. Mood & affect appropriate.    Data Reviewed: I have personally reviewed following labs and imaging studies  CBC:  Recent Labs Lab 08/09/15 1442 08/10/15 0436  WBC 12.4* 7.9  NEUTROABS 9.9* 5.7  HGB 11.4* 10.8*  HCT 36.3* 34.8*  MCV 97.3 98.3  PLT 159 0000000   Basic Metabolic Panel:  Recent Labs Lab 08/09/15 1442 08/10/15 0436  NA 136 138  K 4.2 4.0  CL 98* 100*  CO2 31 33*  GLUCOSE 192* 101*  BUN 17 12  CREATININE 1.16 0.96  CALCIUM 8.5* 8.5*   GFR: Estimated Creatinine Clearance: 82.3 mL/min (by C-G formula based on Cr of 0.96). Liver Function Tests:  Recent Labs Lab 08/09/15 1442 08/10/15 0436  AST 50* 41  ALT 30 25  ALKPHOS 162* 143*  BILITOT 0.7 0.7  PROT 7.6 7.2  ALBUMIN 3.1* 2.8*    Recent  Labs Lab 08/09/15 1441 08/10/15 1458  AMMONIA 29 30   Cardiac Enzymes:  Recent Labs Lab 08/09/15 1442  TROPONINI <0.03    CBG:  Recent Labs Lab 08/10/15 0723 08/10/15 1142 08/10/15 1548 08/10/15 2031 08/10/15 2232  GLUCAP 108* 206* 164* 258* 204*   Lipid Profile: No results for input(s): CHOL, HDL, LDLCALC, TRIG, CHOLHDL, LDLDIRECT in the last 72 hours. Thyroid Function Tests:  Recent Labs  08/09/15 1710  TSH 1.064   Anemia Panel: No results for input(s): VITAMINB12, FOLATE, FERRITIN, TIBC, IRON, RETICCTPCT in the last 72 hours. Urine analysis:    Component Value Date/Time   COLORURINE YELLOW 08/09/2015 1500   APPEARANCEUR CLEAR 08/09/2015 1500   LABSPEC 1.020 08/09/2015 1500   PHURINE 5.5 08/09/2015 1500   GLUCOSEU NEGATIVE 08/09/2015 1500   HGBUR NEGATIVE 08/09/2015 1500   BILIRUBINUR NEGATIVE 08/09/2015 1500   KETONESUR NEGATIVE 08/09/2015 1500   PROTEINUR NEGATIVE 08/09/2015 1500   UROBILINOGEN 0.2 08/23/2014 1321   NITRITE NEGATIVE 08/09/2015 1500   LEUKOCYTESUR NEGATIVE 08/09/2015 1500   Sepsis Labs: @LABRCNTIP (procalcitonin:4,lacticidven:4)  ) Recent Results (from the past 240 hour(s))  MRSA PCR Screening  Status: None   Collection Time: 08/09/15  6:05 PM  Result Value Ref Range Status   MRSA by PCR NEGATIVE NEGATIVE Final    Comment:        The GeneXpert MRSA Assay (FDA approved for NASAL specimens only), is one component of a comprehensive MRSA colonization surveillance program. It is not intended to diagnose MRSA infection nor to guide or monitor treatment for MRSA infections.      Radiology Studies: Dg Chest 2 View  08/09/2015  CLINICAL DATA:  Weakness. History of esophageal cancer. Recently treated for pneumonia. EXAM: CHEST  2 VIEW COMPARISON:  07/16/2015 FINDINGS: Right-sided injectable port is in stable position. Cardiomediastinal silhouette is normal. Mediastinal contours appear intact. There is no evidence of pleural  effusion or pneumothorax. There are persistent areas of subtle airspace consolidation in the left lung base and less so in the right lung base. Osseous structures are without acute abnormality. Soft tissues are grossly normal. IMPRESSION: Persistent airspace consolidation in the left lung base and less so in the right lung base concerning for ongoing pneumonia. Electronically Signed   By: Fidela Salisbury M.D.   On: 08/09/2015 14:26   Ct Head Wo Contrast  08/09/2015  CLINICAL DATA:  Altered mental status.  Esophageal carcinoma. EXAM: CT HEAD WITHOUT CONTRAST TECHNIQUE: Contiguous axial images were obtained from the base of the skull through the vertex without intravenous contrast. COMPARISON:  None. FINDINGS: The ventricles are normal in size and configuration. There is no intracranial mass, hemorrhage, extra-axial fluid collection, or midline shift. The gray-white compartments appear normal. No acute infarct evident. The bony calvarium appears intact. The mastoid air cells are clear. Orbits appear symmetric bilaterally. IMPRESSION: Study within normal limits. Electronically Signed   By: Lowella Grip III M.D.   On: 08/09/2015 15:57   Ct Chest W Contrast  08/10/2015  CLINICAL DATA:  Pneumonia. History of COPD. Esophageal cancer. Pulmonary nodules. EXAM: CT CHEST WITH CONTRAST TECHNIQUE: Multidetector CT imaging of the chest was performed during intravenous contrast administration. CONTRAST:  50mL ISOVUE-300 IOPAMIDOL (ISOVUE-300) INJECTION 61% COMPARISON:  07/08/2015 FINDINGS: Mediastinum/Nodes: Heart is normal size. Aorta is normal caliber. Scattered calcifications in the left anterior descending and right coronary arteries. No definite mediastinal, hilar or axillary adenopathy. Distal esophageal wall thickening again noted and stable in area of known mass. Lungs/Pleura: Airspace disease again noted in the lower lobes with ground-glass opacities and air bronchograms, slightly improved since prior study.  Right middle lobe and lingular consolidation also partially improved. Ground-glass opacities in the upper lobes also improved but persistent. Small right upper lobe nodule measures 9 mm compared to 10 mm previously, not significantly changed. Other scattered small pulmonary nodules unchanged. No pleural effusions. Upper abdomen: Subtle area of low-density in the posterior right hepatic lobe again noted. This may have enlarged in size but is difficult to visualize on this chest CT. Musculoskeletal: No acute bony abnormality or focal bone lesion. IMPRESSION: Improving bilateral pneumonia since prior study. There continue be patchy bilateral ground-glass and more consolidative airspace opacities in the lower lobes with ground-glass opacities in both upper lobes. Improving but persistent opacities in the lingula and right middle lobe as well. Small scattered pulmonary nodules, the largest 9 mm in the right upper lobe, stable. Vague ill-defined low-density area posteriorly in the right hepatic lobe has slightly enlarged since prior MRI from 04/30/2015, but difficult to visualize well on this chest CT. Recommend attention on follow-up abdominal imaging. Coronary artery disease. Electronically Signed   By: Lennette Bihari  [place] M.D.   On: 08/10/2015 13:50    Scheduled Meds: . docusate sodium  100 mg Oral BID  . enoxaparin (LOVENOX) injection  40 mg Subcutaneous Q24H  . feeding supplement  1 Container Oral TID BM  . FLUoxetine  20 mg Oral Daily  . guaiFENesin  1,200 mg Oral BID  . insulin aspart  0-15 Units Subcutaneous TID WC  . ipratropium-albuterol  3 mL Nebulization Q6H  . Ledipasvir-Sofosbuvir  1 tablet Oral Daily  . levothyroxine  25 mcg Oral QAC breakfast  . methylPREDNISolone (SOLU-MEDROL) injection  40 mg Intravenous Q12H  . multivitamin with minerals  1 tablet Oral Daily  . oxyCODONE  20 mg Oral Q8H  . piperacillin-tazobactam (ZOSYN)  IV  3.375 g Intravenous Q8H  . vancomycin  1,000 mg Intravenous Q8H  .  ziprasidone  20 mg Intramuscular Once   Continuous Infusions:    LOS: 1 day    Time spent: 25 minutes  Kathie Dike, MD [facility] Pager [phone]  If 7PM-7AM, please contact night-coverage [email or web address] Password [facility] 08/11/2015, 5:38 AM

## 2015-08-11 NOTE — Progress Notes (Signed)
Called report to [place], RN On [phone] who will be getting patient in [phone]

## 2015-08-11 NOTE — Progress Notes (Signed)
Subjective: He feels much better. He became very confused last night. His breathing is better  Objective: Vital signs in last 24 hours: Temp:  [97.4 F (36.3 C)-97.8 F (36.6 C)] 97.8 F (36.6 C) (06/03 0847) Pulse Rate:  [66-99] 81 (06/03 0600) Resp:  [12-34] 12 (06/03 0600) BP: (101-131)/(60-75) 124/69 mmHg (06/03 0600) SpO2:  [93 %-99 %] 98 % (06/03 0759) Weight:  [73.9 kg (162 lb 14.7 oz)] 73.9 kg (162 lb 14.7 oz) (06/03 0400) Weight change: 1.325 kg (2 lb 14.7 oz) Last BM Date:  (PTA)  Intake/Output from previous day: 06/02 0701 - 06/03 0700 In: 1130 [P.O.:480; IV Piggyback:650] Out: 1350 [Urine:1350]  PHYSICAL EXAM General appearance: alert, cooperative and no distress Resp: rhonchi bilaterally Cardio: regular rate and rhythm, S1, S2 normal, no murmur, click, rub or gallop GI: soft, non-tender; bowel sounds normal; no masses,  no organomegaly Extremities: extremities normal, atraumatic, no cyanosis or edema  Lab Results:  Results for orders placed or performed during the hospital encounter of 08/09/15 (from the past 48 hour(s))  Lactic acid, plasma     Status: None   Collection Time: 08/09/15  2:37 PM  Result Value Ref Range   Lactic Acid, Venous 1.8 0.5 - 2.0 mmol/L  Ammonia     Status: None   Collection Time: 08/09/15  2:41 PM  Result Value Ref Range   Ammonia 29 9 - 35 umol/L  Comprehensive metabolic panel     Status: Abnormal   Collection Time: 08/09/15  2:42 PM  Result Value Ref Range   Sodium 136 135 - 145 mmol/L   Potassium 4.2 3.5 - 5.1 mmol/L   Chloride 98 (L) 101 - 111 mmol/L   CO2 31 22 - 32 mmol/L   Glucose, Bld 192 (H) 65 - 99 mg/dL   BUN 17 6 - 20 mg/dL   Creatinine, Ser 1.16 0.61 - 1.24 mg/dL   Calcium 8.5 (L) 8.9 - 10.3 mg/dL   Total Protein 7.6 6.5 - 8.1 g/dL   Albumin 3.1 (L) 3.5 - 5.0 g/dL   AST 50 (H) 15 - 41 U/L   ALT 30 17 - 63 U/L   Alkaline Phosphatase 162 (H) 38 - 126 U/L   Total Bilirubin 0.7 0.3 - 1.2 mg/dL   GFR calc non Af  Amer >60 >60 mL/min   GFR calc Af Amer >60 >60 mL/min    Comment: (NOTE) The eGFR has been calculated using the CKD EPI equation. This calculation has not been validated in all clinical situations. eGFR's persistently <60 mL/min signify possible Chronic Kidney Disease.    Anion gap 7 5 - 15  Ethanol     Status: None   Collection Time: 08/09/15  2:42 PM  Result Value Ref Range   Alcohol, Ethyl (B) <5 <5 mg/dL    Comment:        LOWEST DETECTABLE LIMIT FOR SERUM ALCOHOL IS 5 mg/dL FOR MEDICAL PURPOSES ONLY   Acetaminophen level     Status: Abnormal   Collection Time: 08/09/15  2:42 PM  Result Value Ref Range   Acetaminophen (Tylenol), Serum <10 (L) 10 - 30 ug/mL    Comment:        THERAPEUTIC CONCENTRATIONS VARY SIGNIFICANTLY. A RANGE OF 10-30 ug/mL MAY BE AN EFFECTIVE CONCENTRATION FOR MANY PATIENTS. HOWEVER, SOME ARE BEST TREATED AT CONCENTRATIONS OUTSIDE THIS RANGE. ACETAMINOPHEN CONCENTRATIONS >150 ug/mL AT 4 HOURS AFTER INGESTION AND >50 ug/mL AT 12 HOURS AFTER INGESTION ARE OFTEN ASSOCIATED WITH TOXIC REACTIONS.  Troponin I     Status: None   Collection Time: 08/09/15  2:42 PM  Result Value Ref Range   Troponin I <0.03 <0.031 ng/mL    Comment:        NO INDICATION OF MYOCARDIAL INJURY.   Salicylate level     Status: None   Collection Time: 08/09/15  2:42 PM  Result Value Ref Range   Salicylate Lvl <7.8 2.8 - 30.0 mg/dL  CBC with Differential     Status: Abnormal   Collection Time: 08/09/15  2:42 PM  Result Value Ref Range   WBC 12.4 (H) 4.0 - 10.5 K/uL   RBC 3.73 (L) 4.22 - 5.81 MIL/uL   Hemoglobin 11.4 (L) 13.0 - 17.0 g/dL   HCT 36.3 (L) 39.0 - 52.0 %   MCV 97.3 78.0 - 100.0 fL   MCH 30.6 26.0 - 34.0 pg   MCHC 31.4 30.0 - 36.0 g/dL   RDW 13.9 11.5 - 15.5 %   Platelets 159 150 - 400 K/uL   Neutrophils Relative % 80 %   Neutro Abs 9.9 (H) 1.7 - 7.7 K/uL   Lymphocytes Relative 7 %   Lymphs Abs 0.8 0.7 - 4.0 K/uL   Monocytes Relative 13 %    Monocytes Absolute 1.7 (H) 0.1 - 1.0 K/uL   Eosinophils Relative 0 %   Eosinophils Absolute 0.0 0.0 - 0.7 K/uL   Basophils Relative 0 %   Basophils Absolute 0.0 0.0 - 0.1 K/uL  Brain natriuretic peptide     Status: Abnormal   Collection Time: 08/09/15  2:43 PM  Result Value Ref Range   B Natriuretic Peptide 215.0 (H) 0.0 - 100.0 pg/mL  Blood gas, arterial     Status: Abnormal   Collection Time: 08/09/15  2:45 PM  Result Value Ref Range   O2 Content 2.0 L/min   Delivery systems NASAL CANNULA    pH, Arterial 7.349 (L) 7.350 - 7.450   pCO2 arterial 58.3 (HH) 35.0 - 45.0 mmHg    Comment: CRITICAL RESULT CALLED TO, READ BACK BY AND VERIFIED WITH: ARMSTRONG,J.RN AT 1510 08/09/15 BY BROADNAX,L.RRT    pO2, Arterial 67.2 (L) 80.0 - 100.0 mmHg   Bicarbonate 28.8 (H) 20.0 - 24.0 mEq/L   Acid-Base Excess 5.9 (H) 0.0 - 2.0 mmol/L   O2 Saturation 92.8 %   Collection site RIGHT RADIAL    Drawn by [id number]    Sample type ARTERIAL    Allens test (pass/fail) PASS PASS  Urinalysis, Routine w reflex microscopic     Status: None   Collection Time: 08/09/15  3:00 PM  Result Value Ref Range   Color, Urine YELLOW YELLOW   APPearance CLEAR CLEAR   Specific Gravity, Urine 1.020 1.005 - 1.030   pH 5.5 5.0 - 8.0   Glucose, UA NEGATIVE NEGATIVE mg/dL   Hgb urine dipstick NEGATIVE NEGATIVE   Bilirubin Urine NEGATIVE NEGATIVE   Ketones, ur NEGATIVE NEGATIVE mg/dL   Protein, ur NEGATIVE NEGATIVE mg/dL   Nitrite NEGATIVE NEGATIVE   Leukocytes, UA NEGATIVE NEGATIVE    Comment: MICROSCOPIC NOT DONE ON URINES WITH NEGATIVE PROTEIN, BLOOD, LEUKOCYTES, NITRITE, OR GLUCOSE <1000 mg/dL.  Urine culture     Status: None   Collection Time: 08/09/15  3:00 PM  Result Value Ref Range   Specimen Description URINE, CATHETERIZED    Special Requests NONE    Culture NO GROWTH Performed at [facility]     Report Status 08/11/2015 FINAL   Urine rapid drug screen (  hosp performed)     Status: Abnormal   Collection  Time: 08/09/15  3:00 PM  Result Value Ref Range   Opiates POSITIVE (A) NONE DETECTED   Cocaine NONE DETECTED NONE DETECTED   Benzodiazepines POSITIVE (A) NONE DETECTED   Amphetamines NONE DETECTED NONE DETECTED   Tetrahydrocannabinol NONE DETECTED NONE DETECTED   Barbiturates NONE DETECTED NONE DETECTED    Comment:        DRUG SCREEN FOR MEDICAL PURPOSES ONLY.  IF CONFIRMATION IS NEEDED FOR ANY PURPOSE, NOTIFY LAB WITHIN 5 DAYS.        LOWEST DETECTABLE LIMITS FOR URINE DRUG SCREEN Drug Class       Cutoff (ng/mL) Amphetamine      1000 Barbiturate      200 Benzodiazepine   035 Tricyclics       597 Opiates          300 Cocaine          300 THC              50   Lactic acid, plasma     Status: None   Collection Time: 08/09/15  5:10 PM  Result Value Ref Range   Lactic Acid, Venous 1.1 0.5 - 2.0 mmol/L  HIV antibody     Status: None   Collection Time: 08/09/15  5:10 PM  Result Value Ref Range   HIV Screen 4th Generation wRfx Non Reactive Non Reactive    Comment: (NOTE) Performed At: [facility] [place], [place] [id number] Lindon Romp MD IW:[id number]   Culture, blood (routine x 2) Call MD if unable to obtain prior to antibiotics being given     Status: None (Preliminary result)   Collection Time: 08/09/15  5:10 PM  Result Value Ref Range   Specimen Description BLOOD RIGHT HAND    Special Requests      BOTTLES DRAWN AEROBIC AND ANAEROBIC AEB=6CC ANA=5CC   Culture NO GROWTH 2 DAYS    Report Status PENDING   TSH     Status: None   Collection Time: 08/09/15  5:10 PM  Result Value Ref Range   TSH 1.064 0.350 - 4.500 uIU/mL  Culture, blood (routine x 2) Call MD if unable to obtain prior to antibiotics being given     Status: None (Preliminary result)   Collection Time: 08/09/15  5:16 PM  Result Value Ref Range   Specimen Description BLOOD RIGHT HAND    Special Requests BOTTLES DRAWN AEROBIC AND ANAEROBIC 6CC EACH    Culture NO GROWTH 2 DAYS     Report Status PENDING   MRSA PCR Screening     Status: None   Collection Time: 08/09/15  6:05 PM  Result Value Ref Range   MRSA by PCR NEGATIVE NEGATIVE    Comment:        The GeneXpert MRSA Assay (FDA approved for NASAL specimens only), is one component of a comprehensive MRSA colonization surveillance program. It is not intended to diagnose MRSA infection nor to guide or monitor treatment for MRSA infections.   Blood gas, arterial     Status: Abnormal   Collection Time: 08/09/15  6:35 PM  Result Value Ref Range   FIO2 0.35    Delivery systems BILEVEL POSITIVE AIRWAY PRESSURE    LHR 14 resp/min   Inspiratory PAP 12    Expiratory PAP 6    pH, Arterial 7.401 7.350 - 7.450   pCO2 arterial 51.0 (H) 35.0 - 45.0 mmHg  pO2, Arterial 90.4 80.0 - 100.0 mmHg   Bicarbonate 29.5 (H) 20.0 - 24.0 mEq/L   Acid-Base Excess 6.3 (H) 0.0 - 2.0 mmol/L   O2 Saturation 97.4 %   Patient temperature 37.0    Collection site RIGHT RADIAL    Drawn by [phone]    Sample type ARTERIAL DRAW    Allens test (pass/fail) PASS PASS  Glucose, capillary     Status: Abnormal   Collection Time: 08/09/15  6:35 PM  Result Value Ref Range   Glucose-Capillary 141 (H) 65 - 99 mg/dL   Comment 1 Notify RN    Comment 2 Document in Chart   Glucose, capillary     Status: Abnormal   Collection Time: 08/09/15  9:29 PM  Result Value Ref Range   Glucose-Capillary 105 (H) 65 - 99 mg/dL   Comment 1 Notify RN   Comprehensive metabolic panel     Status: Abnormal   Collection Time: 08/10/15  4:36 AM  Result Value Ref Range   Sodium 138 135 - 145 mmol/L   Potassium 4.0 3.5 - 5.1 mmol/L   Chloride 100 (L) 101 - 111 mmol/L   CO2 33 (H) 22 - 32 mmol/L   Glucose, Bld 101 (H) 65 - 99 mg/dL   BUN 12 6 - 20 mg/dL   Creatinine, Ser 0.96 0.61 - 1.24 mg/dL   Calcium 8.5 (L) 8.9 - 10.3 mg/dL   Total Protein 7.2 6.5 - 8.1 g/dL   Albumin 2.8 (L) 3.5 - 5.0 g/dL   AST 41 15 - 41 U/L   ALT 25 17 - 63 U/L   Alkaline Phosphatase  143 (H) 38 - 126 U/L   Total Bilirubin 0.7 0.3 - 1.2 mg/dL   GFR calc non Af Amer >60 >60 mL/min   GFR calc Af Amer >60 >60 mL/min    Comment: (NOTE) The eGFR has been calculated using the CKD EPI equation. This calculation has not been validated in all clinical situations. eGFR's persistently <60 mL/min signify possible Chronic Kidney Disease.    Anion gap 5 5 - 15  CBC WITH DIFFERENTIAL     Status: Abnormal   Collection Time: 08/10/15  4:36 AM  Result Value Ref Range   WBC 7.9 4.0 - 10.5 K/uL   RBC 3.54 (L) 4.22 - 5.81 MIL/uL   Hemoglobin 10.8 (L) 13.0 - 17.0 g/dL   HCT 34.8 (L) 39.0 - 52.0 %   MCV 98.3 78.0 - 100.0 fL   MCH 30.5 26.0 - 34.0 pg   MCHC 31.0 30.0 - 36.0 g/dL   RDW 13.8 11.5 - 15.5 %   Platelets 153 150 - 400 K/uL   Neutrophils Relative % 72 %   Neutro Abs 5.7 1.7 - 7.7 K/uL   Lymphocytes Relative 9 %   Lymphs Abs 0.7 0.7 - 4.0 K/uL   Monocytes Relative 17 %   Monocytes Absolute 1.3 (H) 0.1 - 1.0 K/uL   Eosinophils Relative 2 %   Eosinophils Absolute 0.2 0.0 - 0.7 K/uL   Basophils Relative 0 %   Basophils Absolute 0.0 0.0 - 0.1 K/uL  Strep pneumoniae urinary antigen     Status: None   Collection Time: 08/10/15  5:56 AM  Result Value Ref Range   Strep Pneumo Urinary Antigen NEGATIVE NEGATIVE    Comment:        Infection due to S. pneumoniae cannot be absolutely ruled out since the antigen present may be below the detection limit of the test. Performed  at [facility]   Glucose, capillary     Status: Abnormal   Collection Time: 08/10/15  7:23 AM  Result Value Ref Range   Glucose-Capillary 108 (H) 65 - 99 mg/dL   Comment 1 Notify RN   Glucose, capillary     Status: Abnormal   Collection Time: 08/10/15 11:42 AM  Result Value Ref Range   Glucose-Capillary 206 (H) 65 - 99 mg/dL   Comment 1 Notify RN   Blood gas, arterial     Status: Abnormal   Collection Time: 08/10/15  1:45 PM  Result Value Ref Range   O2 Content 3.0 L/min   Delivery  systems NASAL CANNULA    pH, Arterial 7.378 7.350 - 7.450   pCO2 arterial 55.0 (H) 35.0 - 45.0 mmHg   pO2, Arterial 104 (H) 80.0 - 100.0 mmHg   Bicarbonate 29.4 (H) 20.0 - 24.0 mEq/L   Acid-Base Excess 6.8 (H) 0.0 - 2.0 mmol/L   O2 Saturation 98.1 %   Patient temperature 37.0    Collection site RIGHT RADIAL    Drawn by [id number]    Sample type ARTERIAL DRAW    Allens test (pass/fail) PASS PASS  Ammonia     Status: None   Collection Time: 08/10/15  2:58 PM  Result Value Ref Range   Ammonia 30 9 - 35 umol/L  Glucose, capillary     Status: Abnormal   Collection Time: 08/10/15  3:48 PM  Result Value Ref Range   Glucose-Capillary 164 (H) 65 - 99 mg/dL   Comment 1 Notify RN    Comment 2 Document in Chart   Glucose, capillary     Status: Abnormal   Collection Time: 08/10/15  8:31 PM  Result Value Ref Range   Glucose-Capillary 258 (H) 65 - 99 mg/dL  Glucose, capillary     Status: Abnormal   Collection Time: 08/10/15 10:32 PM  Result Value Ref Range   Glucose-Capillary 204 (H) 65 - 99 mg/dL   Comment 1 Notify RN    Comment 2 Document in Chart   CBC     Status: Abnormal   Collection Time: 08/11/15  4:09 AM  Result Value Ref Range   WBC 9.3 4.0 - 10.5 K/uL   RBC 3.59 (L) 4.22 - 5.81 MIL/uL   Hemoglobin 11.2 (L) 13.0 - 17.0 g/dL   HCT 34.4 (L) 39.0 - 52.0 %   MCV 95.8 78.0 - 100.0 fL   MCH 31.2 26.0 - 34.0 pg   MCHC 32.6 30.0 - 36.0 g/dL   RDW 13.3 11.5 - 15.5 %   Platelets 157 150 - 400 K/uL  Basic metabolic panel     Status: Abnormal   Collection Time: 08/11/15  4:09 AM  Result Value Ref Range   Sodium 137 135 - 145 mmol/L   Potassium 4.2 3.5 - 5.1 mmol/L   Chloride 98 (L) 101 - 111 mmol/L   CO2 32 22 - 32 mmol/L   Glucose, Bld 124 (H) 65 - 99 mg/dL   BUN 12 6 - 20 mg/dL   Creatinine, Ser 0.82 0.61 - 1.24 mg/dL   Calcium 8.6 (L) 8.9 - 10.3 mg/dL   GFR calc non Af Amer >60 >60 mL/min   GFR calc Af Amer >60 >60 mL/min    Comment: (NOTE) The eGFR has been calculated using  the CKD EPI equation. This calculation has not been validated in all clinical situations. eGFR's persistently <60 mL/min signify possible Chronic Kidney Disease.    Anion  gap 7 5 - 15  Glucose, capillary     Status: Abnormal   Collection Time: 08/11/15  8:07 AM  Result Value Ref Range   Glucose-Capillary 155 (H) 65 - 99 mg/dL   Comment 1 Notify RN    Comment 2 Document in Chart     ABGS  Recent Labs  08/10/15 1345  PHART 7.378  PO2ART 104*  HCO3 29.4*   CULTURES Recent Results (from the past 240 hour(s))  Urine culture     Status: None   Collection Time: 08/09/15  3:00 PM  Result Value Ref Range Status   Specimen Description URINE, CATHETERIZED  Final   Special Requests NONE  Final   Culture NO GROWTH Performed at [facility]   Final   Report Status 08/11/2015 FINAL  Final  Culture, blood (routine x 2) Call MD if unable to obtain prior to antibiotics being given     Status: None (Preliminary result)   Collection Time: 08/09/15  5:10 PM  Result Value Ref Range Status   Specimen Description BLOOD RIGHT HAND  Final   Special Requests   Final    BOTTLES DRAWN AEROBIC AND ANAEROBIC AEB=6CC ANA=5CC   Culture NO GROWTH 2 DAYS  Final   Report Status PENDING  Incomplete  Culture, blood (routine x 2) Call MD if unable to obtain prior to antibiotics being given     Status: None (Preliminary result)   Collection Time: 08/09/15  5:16 PM  Result Value Ref Range Status   Specimen Description BLOOD RIGHT HAND  Final   Special Requests BOTTLES DRAWN AEROBIC AND ANAEROBIC 6CC EACH  Final   Culture NO GROWTH 2 DAYS  Final   Report Status PENDING  Incomplete  MRSA PCR Screening     Status: None   Collection Time: 08/09/15  6:05 PM  Result Value Ref Range Status   MRSA by PCR NEGATIVE NEGATIVE Final    Comment:        The GeneXpert MRSA Assay (FDA approved for NASAL specimens only), is one component of a comprehensive MRSA colonization surveillance program. It is  not intended to diagnose MRSA infection nor to guide or monitor treatment for MRSA infections.    Studies/Results: Dg Chest 2 View  08/09/2015  CLINICAL DATA:  Weakness. History of esophageal cancer. Recently treated for pneumonia. EXAM: CHEST  2 VIEW COMPARISON:  07/16/2015 FINDINGS: Right-sided injectable port is in stable position. Cardiomediastinal silhouette is normal. Mediastinal contours appear intact. There is no evidence of pleural effusion or pneumothorax. There are persistent areas of subtle airspace consolidation in the left lung base and less so in the right lung base. Osseous structures are without acute abnormality. Soft tissues are grossly normal. IMPRESSION: Persistent airspace consolidation in the left lung base and less so in the right lung base concerning for ongoing pneumonia. Electronically Signed   By: Fidela Salisbury M.D.   On: 08/09/2015 14:26   Ct Head Wo Contrast  08/09/2015  CLINICAL DATA:  Altered mental status.  Esophageal carcinoma. EXAM: CT HEAD WITHOUT CONTRAST TECHNIQUE: Contiguous axial images were obtained from the base of the skull through the vertex without intravenous contrast. COMPARISON:  None. FINDINGS: The ventricles are normal in size and configuration. There is no intracranial mass, hemorrhage, extra-axial fluid collection, or midline shift. The gray-white compartments appear normal. No acute infarct evident. The bony calvarium appears intact. The mastoid air cells are clear. Orbits appear symmetric bilaterally. IMPRESSION: Study within normal limits. Electronically Signed   By:  Lowella Grip III M.D.   On: 08/09/2015 15:57   Ct Chest W Contrast  08/10/2015  CLINICAL DATA:  Pneumonia. History of COPD. Esophageal cancer. Pulmonary nodules. EXAM: CT CHEST WITH CONTRAST TECHNIQUE: Multidetector CT imaging of the chest was performed during intravenous contrast administration. CONTRAST:  28m ISOVUE-300 IOPAMIDOL (ISOVUE-300) INJECTION 61% COMPARISON:   07/08/2015 FINDINGS: Mediastinum/Nodes: Heart is normal size. Aorta is normal caliber. Scattered calcifications in the left anterior descending and right coronary arteries. No definite mediastinal, hilar or axillary adenopathy. Distal esophageal wall thickening again noted and stable in area of known mass. Lungs/Pleura: Airspace disease again noted in the lower lobes with ground-glass opacities and air bronchograms, slightly improved since prior study. Right middle lobe and lingular consolidation also partially improved. Ground-glass opacities in the upper lobes also improved but persistent. Small right upper lobe nodule measures 9 mm compared to 10 mm previously, not significantly changed. Other scattered small pulmonary nodules unchanged. No pleural effusions. Upper abdomen: Subtle area of low-density in the posterior right hepatic lobe again noted. This may have enlarged in size but is difficult to visualize on this chest CT. Musculoskeletal: No acute bony abnormality or focal bone lesion. IMPRESSION: Improving bilateral pneumonia since prior study. There continue be patchy bilateral ground-glass and more consolidative airspace opacities in the lower lobes with ground-glass opacities in both upper lobes. Improving but persistent opacities in the lingula and right middle lobe as well. Small scattered pulmonary nodules, the largest 9 mm in the right upper lobe, stable. Vague ill-defined low-density area posteriorly in the right hepatic lobe has slightly enlarged since prior MRI from 04/30/2015, but difficult to visualize well on this chest CT. Recommend attention on follow-up abdominal imaging. Coronary artery disease. Electronically Signed   By: KRolm BaptiseM.D.   On: 08/10/2015 13:50    Medications:  Prior to Admission:  Prescriptions prior to admission  Medication Sig Dispense Refill Last Dose  . ACCU-CHEK AVIVA PLUS test strip 2 (two) times daily. use for testing  2 08/09/2015 at Unknown time  .  acetaminophen (TYLENOL) 500 MG tablet Take 500 mg by mouth every 6 (six) hours as needed for fever.   08/08/2015 at Unknown time  . albuterol (PROVENTIL HFA;VENTOLIN HFA) 108 (90 BASE) MCG/ACT inhaler Inhale 1-2 puffs into the lungs every 6 (six) hours as needed for wheezing or shortness of breath.    08/08/2015 at Unknown time  . ALPRAZolam (XANAX) 1 MG tablet Take 1 tablet (1 mg total) by mouth every 8 (eight) hours as needed for anxiety. 90 tablet 1 08/08/2015 at Unknown time  . docusate sodium (COLACE) 100 MG capsule Take 100 mg by mouth 2 (two) times daily.  4 08/09/2015 at Unknown time  . FLUoxetine (PROZAC) 20 MG capsule Take 1 capsule (20 mg total) by mouth daily. 30 capsule 3 08/09/2015 at Unknown time  . HUMULIN R 100 UNIT/ML injection Inject 2-12 Units into the skin 2 (two) times daily as needed for high blood sugar. 151-200 = 2 units, 201-250 = 4 units, 251-300 = 6 units, 301-350 = 8 units, 351-400 = 10 units, 401-450 = 12 units.  2 Past Week at Unknown time  . ipratropium-albuterol (DUONEB) 0.5-2.5 (3) MG/3ML SOLN INHALE CONTENTS OF 1 VIAL IN NEBULIZER EVERY 4 HOURS AS NEEDED FOR SHORTNESS OF BREATH.  3 08/08/2015 at Unknown time  . Ledipasvir-Sofosbuvir (HARVONI) 90-400 MG TABS Take 1 tablet by mouth daily.    08/09/2015 at Unknown time  . levothyroxine (SYNTHROID, LEVOTHROID) 25 MCG tablet  Take 25 mcg by mouth daily.  5 08/09/2015 at Unknown time  . lidocaine (XYLOCAINE) 2 % solution Reported on 07/24/2015  3 Past Month at Unknown time  . lidocaine-prilocaine (EMLA) cream Apply a quarter size amount to port site 1 hour prior to chemo. Do not rub in. Cover with plastic wrap. 30 g 3 08/09/2015 at Unknown time  . meloxicam (MOBIC) 7.5 MG tablet TAKE ONE TABLET BY MOUTH ONCE DAILY  6 08/09/2015 at Unknown time  . metFORMIN (GLUCOPHAGE) 500 MG tablet Take 500 mg by mouth 2 (two) times daily.  3 08/09/2015 at Unknown time  . nitroGLYCERIN (NITROSTAT) 0.4 MG SL tablet Place 1 tablet (0.4 mg total) under the tongue  every 5 (five) minutes as needed for chest pain. 25 tablet 3 unknown  . ondansetron (ZOFRAN) 8 MG tablet TAKE 1 TABLET (8 MG TOTAL) BY MOUTH EVERY 8 (EIGHT) HOURS AS NEEDED FOR NAUSEA OR VOMITING. 30 tablet 2 08/09/2015 at Unknown time  . oxyCODONE (OXYCONTIN) 20 mg 12 hr tablet Take 1 tablet (20 mg total) by mouth every 8 (eight) hours. 90 tablet 0 08/08/2015 at Unknown time  . oxyCODONE-acetaminophen (PERCOCET) 10-325 MG tablet Take 1 tablet by mouth every 4 (four) hours as needed for pain. 90 tablet 0 08/08/2015 at Unknown time  . prochlorperazine (COMPAZINE) 10 MG tablet TAKE 1 TABLET (10 MG TOTAL) BY MOUTH EVERY 6 (SIX) HOURS AS NEEDED FOR NAUSEA OR VOMITING. 30 tablet 2 Past Week at Unknown time  . SPIRIVA HANDIHALER 18 MCG inhalation capsule Place 1 puff into inhaler and inhale daily.  5 08/08/2015 at Unknown time  . sucralfate (CARAFATE) 1 g tablet Take 1 tablet by mouth 2 (two) times daily as needed (throat pain).   5 Past Month at Unknown time  . tetrahydrozoline 0.05 % ophthalmic solution Place 1 drop into both eyes daily as needed (dry eyes).   unknown   Scheduled: . ampicillin-sulbactam (UNASYN) IV  3 g Intravenous Q6H  . docusate sodium  100 mg Oral BID  . enoxaparin (LOVENOX) injection  40 mg Subcutaneous Q24H  . feeding supplement  1 Container Oral TID BM  . FLUoxetine  20 mg Oral Daily  . guaiFENesin  1,200 mg Oral BID  . insulin aspart  0-15 Units Subcutaneous TID WC  . ipratropium-albuterol  3 mL Nebulization Q6H  . Ledipasvir-Sofosbuvir  1 tablet Oral Daily  . levothyroxine  25 mcg Oral QAC breakfast  . methylPREDNISolone (SOLU-MEDROL) injection  40 mg Intravenous Q12H  . multivitamin with minerals  1 tablet Oral Daily  . oxyCODONE  20 mg Oral Q8H   Continuous:  ZYS:[id number], albuterol, ALPRAZolam, nitroGLYCERIN, ondansetron, oxyCODONE-acetaminophen **AND** oxyCODONE, polyvinyl alcohol, prochlorperazine  Assesment: He was admitted with COPD exacerbation healthcare  associated pneumonia and acute on chronic respiratory failure. I think he's probably been aspirating but we did not have definite evidence of that on CT. He had an episode of being acutely confused and agitated and that's better.  Principal Problem:   Altered mental status Active Problems:   COPD (chronic obstructive pulmonary disease) (HCC)   Depression   Dysphagia   Esophageal cancer (HCC)   Diabetes mellitus without complication, with long-term current use of insulin (HCC)   HCAP (healthcare-associated pneumonia)   Acute on chronic respiratory failure with hypoxia and hypercapnia (HCC)   Anxiety   Chronic pain   Hypothyroidism   Acute encephalopathy   Hepatitis C, chronic (HCC)   Hypercarbia    Plan: Continue current treatments. He does  seem to be improving. I think it's likely that he is aspirating and he will need swallowing study but that will have to be postponed    LOS: 1 day   Lindsay Soulliere L 08/11/2015, 10:16 AM

## 2015-08-12 DIAGNOSIS — F419 Anxiety disorder, unspecified: Secondary | ICD-10-CM

## 2015-08-12 LAB — GLUCOSE, CAPILLARY
GLUCOSE-CAPILLARY: 155 mg/dL — AB (ref 65–99)
Glucose-Capillary: 224 mg/dL — ABNORMAL HIGH (ref 65–99)
Glucose-Capillary: 210 mg/dL — ABNORMAL HIGH (ref 65–99)
Glucose-Capillary: 249 mg/dL — ABNORMAL HIGH (ref 65–99)

## 2015-08-12 LAB — BASIC METABOLIC PANEL
ANION GAP: 8 (ref 5–15)
BUN: 16 mg/dL (ref 6–20)
CHLORIDE: 98 mmol/L — AB (ref 101–111)
CO2: 31 mmol/L (ref 22–32)
Calcium: 8.5 mg/dL — ABNORMAL LOW (ref 8.9–10.3)
Creatinine, Ser: 0.8 mg/dL (ref 0.61–1.24)
Glucose, Bld: 150 mg/dL — ABNORMAL HIGH (ref 65–99)
POTASSIUM: 4.1 mmol/L (ref 3.5–5.1)
SODIUM: 137 mmol/L (ref 135–145)

## 2015-08-12 LAB — CBC
HEMATOCRIT: 33.6 % — AB (ref 39.0–52.0)
Hemoglobin: 10.8 g/dL — ABNORMAL LOW (ref 13.0–17.0)
MCH: 30.3 pg (ref 26.0–34.0)
MCHC: 32.1 g/dL (ref 30.0–36.0)
MCV: 94.4 fL (ref 78.0–100.0)
Platelets: 200 10*3/uL (ref 150–400)
RBC: 3.56 MIL/uL — AB (ref 4.22–5.81)
RDW: 13.6 % (ref 11.5–15.5)
WBC: 12.4 10*3/uL — AB (ref 4.0–10.5)

## 2015-08-12 NOTE — Progress Notes (Signed)
PROGRESS NOTE    Brent Franco  [id number] DOB: 03/16/1950 DOA: 08/09/2015 PCP: [name], NP Outpatient Specialists:  Gastroenterology; Daneil Dolin, MD   Brief Narrative:  38 yom with a hx of COPD with supplemental O2 at home, depression, anxiety, Hep C, and esophageal cancer, presented with complaints of confusion, increased cough, and hypoxia. He has had multiple recent hospitalizations, including HCAP 3 weeks ago. Patient improved with Vanc and Zosyn, and did well from a respiration standpoint. He was seen at the cancer center on 6/01 for a regularly scheduled appointment and was found to be confused with O2 sats at 86%. He also reported a cough onset 5/31. CXR done in ED was notable for persistent PNA. Pt was placed on BiPAP in the ED, treated with nebs, cefepime and Vanc. Pt was admitted to stepdown for management of acute encephalopathy suspected secondary to hypercarbia in the setting of HCAP and chronic respiratory failure. Yesterday, patient became increasingly agitated and confused, and was requesting to speak to Dr. Whitney Muse. After speaking with Dr. Whitney Muse, patient became calm and cooperative. Dr. Donald Pore assistance is greatly appreciated.    Assessment & Plan:   Principal Problem:   Altered mental status Active Problems:   COPD (chronic obstructive pulmonary disease) (HCC)   Depression   Dysphagia   Esophageal cancer (HCC)   Diabetes mellitus without complication, with long-term current use of insulin (HCC)   HCAP (healthcare-associated pneumonia)   Acute on chronic respiratory failure with hypoxia and hypercapnia (HCC)   Anxiety   Chronic pain   Hypothyroidism   Acute encephalopathy   Hepatitis C, chronic (HCC)   Hypercarbia  1. Acute encephalopathy. Suspected secondary to hypercarbia/hypoxia, improved with BiPAP and patient was transitioned to [place]. Initial head CT showed no acute findings. Patient became increasingly confused and agitated on 6/02. His wife  stated that he has had similar confusion during past stay in the ICU. Repeat ABG did not show any significant change. Ammonia level is within normal limits. Patient has since become calm and cooperative, but will need to continue to monitor closely. Likely had a component of ICU delirium.  2. Aspiration PNA. With the recurrent nature of his illness, aspiration may be playing a major role. CT chest showed improving bilateral PNA since prior study. Urine pneumo strep and Legionella returned negative. Continue Unasyn. Blood cx show no growth x2 days. He has been scheduled for outpatient MBSS, since this cannot be done as an inpatient at this time.  3. COPD with chronic respiratory failure. Patient requires 2.5L PM at home. Continue breathing treatments and O2. He is also on IV steroids. No evidence of wheeze at this time. Continue [place] 3L. 4. Acute on chronic respiratory failure with hypoxia. Patient is chronically on 2.5L home O2. He required BiPAP on admission. Worsening respiratory status likely related to PNA. Wean back to baseline O2 requirement as tolerated.  5. Esophageal cancer with suspected metastases. Reported suspicion for liver metastasis. PET scheduled for 6/5. Continue management per oncology team.  6. Depression, anxiety. Stable. Continue Prozac and PRN Xanax.  7. Hypothyroidism. TSH normal. Continue synthroid.  8. Normocytic anemia. No sign of active blood-loss. Hgb stable.  9. Chronic pain. Continue management PRN.  10. Chronic Hepatitis C. Patient has approximately 2 weeks left of treatment with [place]. Will continue. 11. CAD. Stable   DVT prophylaxis: Lovenox Code Status: Full Family Communication: discussed with patient and wife at the bedside Disposition Plan: discharge home once improved, possibly in AM  Consultants:   Pulmonology  Procedures:   BiPAP 6/01  Antimicrobials:   Vanc 6/01 >>6/03  Zosyn 6/01 >> 6/03  Unasyn 6/03 >>  Subjective: Feeling better. Still  has productive cough, overall shortness of breath improving.  Objective: Filed Vitals:   08/11/15 1621 08/11/15 1953 08/11/15 2108 08/12/15 0535  BP:   113/65 126/79  Pulse:  79 83 72  Temp: 97.1 F (36.2 C)  97.8 F (36.6 C) 97.5 F (36.4 C)  TempSrc: Oral  Oral Oral  Resp:  20 20 20   Height:      Weight:      SpO2:  96% 98% 97%    Intake/Output Summary (Last 24 hours) at 08/12/15 0648 Last data filed at 08/12/15 0500  Gross per 24 hour  Intake   1080 ml  Output      0 ml  Net   1080 ml   Filed Weights   08/09/15 1343 08/10/15 0500 08/11/15 0400  Weight: 72.576 kg (160 lb) 74.3 kg (163 lb 12.8 oz) 73.9 kg (162 lb 14.7 oz)    Examination:   General exam: Appears calm and comfortable  Respiratory system: Coarse breath sounds at bases. Respiratory effort normal. Cardiovascular system: S1 & S2 heard, RRR. No JVD, murmurs, rubs, gallops or clicks. No pedal edema. Gastrointestinal system: Abdomen is nondistended, soft and nontender. No organomegaly or masses felt. Normal bowel sounds heard. Central nervous system: Alert and oriented. No focal neurological deficits. Extremities: Symmetric 5 x 5 power. Skin: No rashes, lesions or ulcers Psychiatry: Judgement and insight appear normal. Mood & affect appropriate.   Data Reviewed: I have personally reviewed following labs and imaging studies  CBC:  Recent Labs Lab 08/09/15 1442 08/10/15 0436 08/11/15 0409 08/12/15 0601  WBC 12.4* 7.9 9.3 12.4*  NEUTROABS 9.9* 5.7  --   --   HGB 11.4* 10.8* 11.2* 10.8*  HCT 36.3* 34.8* 34.4* 33.6*  MCV 97.3 98.3 95.8 94.4  PLT 159 153 157 [id number]   Basic Metabolic Panel:  Recent Labs Lab 08/09/15 1442 08/10/15 0436 08/11/15 0409 08/12/15 0601  NA 136 138 137 137  K 4.2 4.0 4.2 4.1  CL 98* 100* 98* 98*  CO2 31 33* 32 31  GLUCOSE 192* 101* 124* 150*  BUN 17 12 12 16   CREATININE 1.16 0.96 0.82 0.80  CALCIUM 8.5* 8.5* 8.6* 8.5*   GFR: Estimated Creatinine Clearance: 98.8  mL/min (by C-G formula based on Cr of 0.8). Liver Function Tests:  Recent Labs Lab 08/09/15 1442 08/10/15 0436  AST 50* 41  ALT 30 25  ALKPHOS 162* 143*  BILITOT 0.7 0.7  PROT 7.6 7.2  ALBUMIN 3.1* 2.8*    Recent Labs Lab 08/09/15 1441 08/10/15 1458  AMMONIA 29 30   Cardiac Enzymes:  Recent Labs Lab 08/09/15 1442  TROPONINI <0.03    CBG:  Recent Labs Lab 08/10/15 2232 08/11/15 0807 08/11/15 1159 08/11/15 1615 08/11/15 2104  GLUCAP 204* 155* 240* 243* 188*   Thyroid Function Tests:  Recent Labs  08/09/15 1710  TSH 1.064   Urine analysis:    Component Value Date/Time   COLORURINE YELLOW 08/09/2015 1500   APPEARANCEUR CLEAR 08/09/2015 1500   LABSPEC 1.020 08/09/2015 1500   PHURINE 5.5 08/09/2015 1500   GLUCOSEU NEGATIVE 08/09/2015 1500   HGBUR NEGATIVE 08/09/2015 1500   BILIRUBINUR NEGATIVE 08/09/2015 1500   KETONESUR NEGATIVE 08/09/2015 1500   PROTEINUR NEGATIVE 08/09/2015 1500   UROBILINOGEN 0.2 08/23/2014 1321   NITRITE NEGATIVE 08/09/2015 1500  LEUKOCYTESUR NEGATIVE 08/09/2015 1500   Sepsis Labs: @LABRCNTIP (procalcitonin:4,lacticidven:4)  ) Recent Results (from the past 240 hour(s))  Urine culture     Status: None   Collection Time: 08/09/15  3:00 PM  Result Value Ref Range Status   Specimen Description URINE, CATHETERIZED  Final   Special Requests NONE  Final   Culture NO GROWTH Performed at [facility]   Final   Report Status 08/11/2015 FINAL  Final  Culture, blood (routine x 2) Call MD if unable to obtain prior to antibiotics being given     Status: None (Preliminary result)   Collection Time: 08/09/15  5:10 PM  Result Value Ref Range Status   Specimen Description BLOOD RIGHT HAND  Final   Special Requests   Final    BOTTLES DRAWN AEROBIC AND ANAEROBIC AEB=6CC ANA=5CC   Culture NO GROWTH 2 DAYS  Final   Report Status PENDING  Incomplete  Culture, blood (routine x 2) Call MD if unable to obtain prior to antibiotics being  given     Status: None (Preliminary result)   Collection Time: 08/09/15  5:16 PM  Result Value Ref Range Status   Specimen Description BLOOD RIGHT HAND  Final   Special Requests BOTTLES DRAWN AEROBIC AND ANAEROBIC 6CC EACH  Final   Culture NO GROWTH 2 DAYS  Final   Report Status PENDING  Incomplete  MRSA PCR Screening     Status: None   Collection Time: 08/09/15  6:05 PM  Result Value Ref Range Status   MRSA by PCR NEGATIVE NEGATIVE Final    Comment:        The GeneXpert MRSA Assay (FDA approved for NASAL specimens only), is one component of a comprehensive MRSA colonization surveillance program. It is not intended to diagnose MRSA infection nor to guide or monitor treatment for MRSA infections.      Radiology Studies: Ct Chest W Contrast  08/10/2015  CLINICAL DATA:  Pneumonia. History of COPD. Esophageal cancer. Pulmonary nodules. EXAM: CT CHEST WITH CONTRAST TECHNIQUE: Multidetector CT imaging of the chest was performed during intravenous contrast administration. CONTRAST:  70mL ISOVUE-300 IOPAMIDOL (ISOVUE-300) INJECTION 61% COMPARISON:  07/08/2015 FINDINGS: Mediastinum/Nodes: Heart is normal size. Aorta is normal caliber. Scattered calcifications in the left anterior descending and right coronary arteries. No definite mediastinal, hilar or axillary adenopathy. Distal esophageal wall thickening again noted and stable in area of known mass. Lungs/Pleura: Airspace disease again noted in the lower lobes with ground-glass opacities and air bronchograms, slightly improved since prior study. Right middle lobe and lingular consolidation also partially improved. Ground-glass opacities in the upper lobes also improved but persistent. Small right upper lobe nodule measures 9 mm compared to 10 mm previously, not significantly changed. Other scattered small pulmonary nodules unchanged. No pleural effusions. Upper abdomen: Subtle area of low-density in the posterior right hepatic lobe again noted.  This may have enlarged in size but is difficult to visualize on this chest CT. Musculoskeletal: No acute bony abnormality or focal bone lesion. IMPRESSION: Improving bilateral pneumonia since prior study. There continue be patchy bilateral ground-glass and more consolidative airspace opacities in the lower lobes with ground-glass opacities in both upper lobes. Improving but persistent opacities in the lingula and right middle lobe as well. Small scattered pulmonary nodules, the largest 9 mm in the right upper lobe, stable. Vague ill-defined low-density area posteriorly in the right hepatic lobe has slightly enlarged since prior MRI from 04/30/2015, but difficult to visualize well on this chest CT. Recommend attention on  follow-up abdominal imaging. Coronary artery disease. Electronically Signed   By: Rolm Baptise M.D.   On: 08/10/2015 13:50    Scheduled Meds: . ampicillin-sulbactam (UNASYN) IV  3 g Intravenous Q6H  . docusate sodium  100 mg Oral BID  . enoxaparin (LOVENOX) injection  40 mg Subcutaneous Q24H  . feeding supplement  1 Container Oral TID BM  . FLUoxetine  20 mg Oral Daily  . guaiFENesin  1,200 mg Oral BID  . insulin aspart  0-15 Units Subcutaneous TID WC  . ipratropium-albuterol  3 mL Nebulization TID  . Ledipasvir-Sofosbuvir  1 tablet Oral Daily  . levothyroxine  25 mcg Oral QAC breakfast  . methylPREDNISolone (SOLU-MEDROL) injection  40 mg Intravenous Q12H  . multivitamin with minerals  1 tablet Oral Daily  . oxyCODONE  20 mg Oral Q8H  . polyethylene glycol  17 g Oral Daily   Continuous Infusions:    LOS: 2 days    Time spent: 25 minutes  Kathie Dike, MD [facility] Pager [phone]  If 7PM-7AM, please contact night-coverage [email or web address] Password [facility] 08/12/2015, 6:48 AM

## 2015-08-12 NOTE — Progress Notes (Signed)
Subjective: He says he feels better. He has no new complaints. His breathing is much better.  Objective: Vital signs in last 24 hours: Temp:  [97.1 F (36.2 C)-97.8 F (36.6 C)] 97.5 F (36.4 C) (06/04 0535) Pulse Rate:  [72-98] 72 (06/04 0535) Resp:  [17-21] 20 (06/04 0535) BP: (113-126)/(65-79) 126/79 mmHg (06/04 0535) SpO2:  [95 %-99 %] 97 % (06/04 0535) Weight change:  Last BM Date: 08/09/15  Intake/Output from previous day: 06/03 0701 - 06/04 0700 In: 1080 [P.O.:680; IV Piggyback:400] Out: 500 [Urine:500]  PHYSICAL EXAM General appearance: alert, cooperative and no distress Resp: rhonchi bilaterally Cardio: regular rate and rhythm, S1, S2 normal, no murmur, click, rub or gallop GI: soft, non-tender; bowel sounds normal; no masses,  no organomegaly Extremities: extremities normal, atraumatic, no cyanosis or edema  Lab Results:  Results for orders placed or performed during the hospital encounter of 08/09/15 (from the past 48 hour(s))  Glucose, capillary     Status: Abnormal   Collection Time: 08/10/15 11:42 AM  Result Value Ref Range   Glucose-Capillary 206 (H) 65 - 99 mg/dL   Comment 1 Notify RN   Blood gas, arterial     Status: Abnormal   Collection Time: 08/10/15  1:45 PM  Result Value Ref Range   O2 Content 3.0 L/min   Delivery systems NASAL CANNULA    pH, Arterial 7.378 7.350 - 7.450   pCO2 arterial 55.0 (H) 35.0 - 45.0 mmHg   pO2, Arterial 104 (H) 80.0 - 100.0 mmHg   Bicarbonate 29.4 (H) 20.0 - 24.0 mEq/L   Acid-Base Excess 6.8 (H) 0.0 - 2.0 mmol/L   O2 Saturation 98.1 %   Patient temperature 37.0    Collection site RIGHT RADIAL    Drawn by [id number]    Sample type ARTERIAL DRAW    Allens test (pass/fail) PASS PASS  Ammonia     Status: None   Collection Time: 08/10/15  2:58 PM  Result Value Ref Range   Ammonia 30 9 - 35 umol/L  Glucose, capillary     Status: Abnormal   Collection Time: 08/10/15  3:48 PM  Result Value Ref Range   Glucose-Capillary 164  (H) 65 - 99 mg/dL   Comment 1 Notify RN    Comment 2 Document in Chart   Glucose, capillary     Status: Abnormal   Collection Time: 08/10/15  8:31 PM  Result Value Ref Range   Glucose-Capillary 258 (H) 65 - 99 mg/dL  Glucose, capillary     Status: Abnormal   Collection Time: 08/10/15 10:32 PM  Result Value Ref Range   Glucose-Capillary 204 (H) 65 - 99 mg/dL   Comment 1 Notify RN    Comment 2 Document in Chart   CBC     Status: Abnormal   Collection Time: 08/11/15  4:09 AM  Result Value Ref Range   WBC 9.3 4.0 - 10.5 K/uL   RBC 3.59 (L) 4.22 - 5.81 MIL/uL   Hemoglobin 11.2 (L) 13.0 - 17.0 g/dL   HCT 34.4 (L) 39.0 - 52.0 %   MCV 95.8 78.0 - 100.0 fL   MCH 31.2 26.0 - 34.0 pg   MCHC 32.6 30.0 - 36.0 g/dL   RDW 13.3 11.5 - 15.5 %   Platelets 157 150 - 400 K/uL  Basic metabolic panel     Status: Abnormal   Collection Time: 08/11/15  4:09 AM  Result Value Ref Range   Sodium 137 135 - 145 mmol/L   Potassium  4.2 3.5 - 5.1 mmol/L   Chloride 98 (L) 101 - 111 mmol/L   CO2 32 22 - 32 mmol/L   Glucose, Bld 124 (H) 65 - 99 mg/dL   BUN 12 6 - 20 mg/dL   Creatinine, Ser 0.82 0.61 - 1.24 mg/dL   Calcium 8.6 (L) 8.9 - 10.3 mg/dL   GFR calc non Af Amer >60 >60 mL/min   GFR calc Af Amer >60 >60 mL/min    Comment: (NOTE) The eGFR has been calculated using the CKD EPI equation. This calculation has not been validated in all clinical situations. eGFR's persistently <60 mL/min signify possible Chronic Kidney Disease.    Anion gap 7 5 - 15  Glucose, capillary     Status: Abnormal   Collection Time: 08/11/15  8:07 AM  Result Value Ref Range   Glucose-Capillary 155 (H) 65 - 99 mg/dL   Comment 1 Notify RN    Comment 2 Document in Chart   Glucose, capillary     Status: Abnormal   Collection Time: 08/11/15 11:59 AM  Result Value Ref Range   Glucose-Capillary 240 (H) 65 - 99 mg/dL   Comment 1 Notify RN    Comment 2 Document in Chart   Glucose, capillary     Status: Abnormal   Collection  Time: 08/11/15  4:15 PM  Result Value Ref Range   Glucose-Capillary 243 (H) 65 - 99 mg/dL  Glucose, capillary     Status: Abnormal   Collection Time: 08/11/15  9:04 PM  Result Value Ref Range   Glucose-Capillary 188 (H) 65 - 99 mg/dL  Basic metabolic panel     Status: Abnormal   Collection Time: 08/12/15  6:01 AM  Result Value Ref Range   Sodium 137 135 - 145 mmol/L   Potassium 4.1 3.5 - 5.1 mmol/L   Chloride 98 (L) 101 - 111 mmol/L   CO2 31 22 - 32 mmol/L   Glucose, Bld 150 (H) 65 - 99 mg/dL   BUN 16 6 - 20 mg/dL   Creatinine, Ser 0.80 0.61 - 1.24 mg/dL   Calcium 8.5 (L) 8.9 - 10.3 mg/dL   GFR calc non Af Amer >60 >60 mL/min   GFR calc Af Amer >60 >60 mL/min    Comment: (NOTE) The eGFR has been calculated using the CKD EPI equation. This calculation has not been validated in all clinical situations. eGFR's persistently <60 mL/min signify possible Chronic Kidney Disease.    Anion gap 8 5 - 15  CBC     Status: Abnormal   Collection Time: 08/12/15  6:01 AM  Result Value Ref Range   WBC 12.4 (H) 4.0 - 10.5 K/uL   RBC 3.56 (L) 4.22 - 5.81 MIL/uL   Hemoglobin 10.8 (L) 13.0 - 17.0 g/dL   HCT 33.6 (L) 39.0 - 52.0 %   MCV 94.4 78.0 - 100.0 fL   MCH 30.3 26.0 - 34.0 pg   MCHC 32.1 30.0 - 36.0 g/dL   RDW 13.6 11.5 - 15.5 %   Platelets 200 150 - 400 K/uL    ABGS  Recent Labs  08/10/15 1345  PHART 7.378  PO2ART 104*  HCO3 29.4*   CULTURES Recent Results (from the past 240 hour(s))  Urine culture     Status: None   Collection Time: 08/09/15  3:00 PM  Result Value Ref Range Status   Specimen Description URINE, CATHETERIZED  Final   Special Requests NONE  Final   Culture NO GROWTH Performed at [facility]  [facility]   Final   Report Status 08/11/2015 FINAL  Final  Culture, blood (routine x 2) Call MD if unable to obtain prior to antibiotics being given     Status: None (Preliminary result)   Collection Time: 08/09/15  5:10 PM  Result Value Ref Range Status   Specimen  Description BLOOD RIGHT HAND  Final   Special Requests   Final    BOTTLES DRAWN AEROBIC AND ANAEROBIC AEB=6CC ANA=5CC   Culture NO GROWTH 3 DAYS  Final   Report Status PENDING  Incomplete  Culture, blood (routine x 2) Call MD if unable to obtain prior to antibiotics being given     Status: None (Preliminary result)   Collection Time: 08/09/15  5:16 PM  Result Value Ref Range Status   Specimen Description BLOOD RIGHT HAND  Final   Special Requests BOTTLES DRAWN AEROBIC AND ANAEROBIC 6CC EACH  Final   Culture NO GROWTH 3 DAYS  Final   Report Status PENDING  Incomplete  MRSA PCR Screening     Status: None   Collection Time: 08/09/15  6:05 PM  Result Value Ref Range Status   MRSA by PCR NEGATIVE NEGATIVE Final    Comment:        The GeneXpert MRSA Assay (FDA approved for NASAL specimens only), is one component of a comprehensive MRSA colonization surveillance program. It is not intended to diagnose MRSA infection nor to guide or monitor treatment for MRSA infections.    Studies/Results: Ct Chest W Contrast  08/10/2015  CLINICAL DATA:  Pneumonia. History of COPD. Esophageal cancer. Pulmonary nodules. EXAM: CT CHEST WITH CONTRAST TECHNIQUE: Multidetector CT imaging of the chest was performed during intravenous contrast administration. CONTRAST:  19m ISOVUE-300 IOPAMIDOL (ISOVUE-300) INJECTION 61% COMPARISON:  07/08/2015 FINDINGS: Mediastinum/Nodes: Heart is normal size. Aorta is normal caliber. Scattered calcifications in the left anterior descending and right coronary arteries. No definite mediastinal, hilar or axillary adenopathy. Distal esophageal wall thickening again noted and stable in area of known mass. Lungs/Pleura: Airspace disease again noted in the lower lobes with ground-glass opacities and air bronchograms, slightly improved since prior study. Right middle lobe and lingular consolidation also partially improved. Ground-glass opacities in the upper lobes also improved but  persistent. Small right upper lobe nodule measures 9 mm compared to 10 mm previously, not significantly changed. Other scattered small pulmonary nodules unchanged. No pleural effusions. Upper abdomen: Subtle area of low-density in the posterior right hepatic lobe again noted. This may have enlarged in size but is difficult to visualize on this chest CT. Musculoskeletal: No acute bony abnormality or focal bone lesion. IMPRESSION: Improving bilateral pneumonia since prior study. There continue be patchy bilateral ground-glass and more consolidative airspace opacities in the lower lobes with ground-glass opacities in both upper lobes. Improving but persistent opacities in the lingula and right middle lobe as well. Small scattered pulmonary nodules, the largest 9 mm in the right upper lobe, stable. Vague ill-defined low-density area posteriorly in the right hepatic lobe has slightly enlarged since prior MRI from 04/30/2015, but difficult to visualize well on this chest CT. Recommend attention on follow-up abdominal imaging. Coronary artery disease. Electronically Signed   By: KRolm BaptiseM.D.   On: 08/10/2015 13:50    Medications:  Prior to Admission:  Prescriptions prior to admission  Medication Sig Dispense Refill Last Dose  . ACCU-CHEK AVIVA PLUS test strip 2 (two) times daily. use for testing  2 08/09/2015 at Unknown time  . acetaminophen (TYLENOL) 500 MG tablet  Take 500 mg by mouth every 6 (six) hours as needed for fever.   08/08/2015 at Unknown time  . albuterol (PROVENTIL HFA;VENTOLIN HFA) 108 (90 BASE) MCG/ACT inhaler Inhale 1-2 puffs into the lungs every 6 (six) hours as needed for wheezing or shortness of breath.    08/08/2015 at Unknown time  . ALPRAZolam (XANAX) 1 MG tablet Take 1 tablet (1 mg total) by mouth every 8 (eight) hours as needed for anxiety. 90 tablet 1 08/08/2015 at Unknown time  . docusate sodium (COLACE) 100 MG capsule Take 100 mg by mouth 2 (two) times daily.  4 08/09/2015 at Unknown  time  . FLUoxetine (PROZAC) 20 MG capsule Take 1 capsule (20 mg total) by mouth daily. 30 capsule 3 08/09/2015 at Unknown time  . HUMULIN R 100 UNIT/ML injection Inject 2-12 Units into the skin 2 (two) times daily as needed for high blood sugar. 151-200 = 2 units, 201-250 = 4 units, 251-300 = 6 units, 301-350 = 8 units, 351-400 = 10 units, 401-450 = 12 units.  2 Past Week at Unknown time  . ipratropium-albuterol (DUONEB) 0.5-2.5 (3) MG/3ML SOLN INHALE CONTENTS OF 1 VIAL IN NEBULIZER EVERY 4 HOURS AS NEEDED FOR SHORTNESS OF BREATH.  3 08/08/2015 at Unknown time  . Ledipasvir-Sofosbuvir (HARVONI) 90-400 MG TABS Take 1 tablet by mouth daily.    08/09/2015 at Unknown time  . levothyroxine (SYNTHROID, LEVOTHROID) 25 MCG tablet Take 25 mcg by mouth daily.  5 08/09/2015 at Unknown time  . lidocaine (XYLOCAINE) 2 % solution Reported on 07/24/2015  3 Past Month at Unknown time  . lidocaine-prilocaine (EMLA) cream Apply a quarter size amount to port site 1 hour prior to chemo. Do not rub in. Cover with plastic wrap. 30 g 3 08/09/2015 at Unknown time  . meloxicam (MOBIC) 7.5 MG tablet TAKE ONE TABLET BY MOUTH ONCE DAILY  6 08/09/2015 at Unknown time  . metFORMIN (GLUCOPHAGE) 500 MG tablet Take 500 mg by mouth 2 (two) times daily.  3 08/09/2015 at Unknown time  . nitroGLYCERIN (NITROSTAT) 0.4 MG SL tablet Place 1 tablet (0.4 mg total) under the tongue every 5 (five) minutes as needed for chest pain. 25 tablet 3 unknown  . ondansetron (ZOFRAN) 8 MG tablet TAKE 1 TABLET (8 MG TOTAL) BY MOUTH EVERY 8 (EIGHT) HOURS AS NEEDED FOR NAUSEA OR VOMITING. 30 tablet 2 08/09/2015 at Unknown time  . oxyCODONE (OXYCONTIN) 20 mg 12 hr tablet Take 1 tablet (20 mg total) by mouth every 8 (eight) hours. 90 tablet 0 08/08/2015 at Unknown time  . oxyCODONE-acetaminophen (PERCOCET) 10-325 MG tablet Take 1 tablet by mouth every 4 (four) hours as needed for pain. 90 tablet 0 08/08/2015 at Unknown time  . prochlorperazine (COMPAZINE) 10 MG tablet TAKE 1  TABLET (10 MG TOTAL) BY MOUTH EVERY 6 (SIX) HOURS AS NEEDED FOR NAUSEA OR VOMITING. 30 tablet 2 Past Week at Unknown time  . SPIRIVA HANDIHALER 18 MCG inhalation capsule Place 1 puff into inhaler and inhale daily.  5 08/08/2015 at Unknown time  . sucralfate (CARAFATE) 1 g tablet Take 1 tablet by mouth 2 (two) times daily as needed (throat pain).   5 Past Month at Unknown time  . tetrahydrozoline 0.05 % ophthalmic solution Place 1 drop into both eyes daily as needed (dry eyes).   unknown   Scheduled: . ampicillin-sulbactam (UNASYN) IV  3 g Intravenous Q6H  . docusate sodium  100 mg Oral BID  . enoxaparin (LOVENOX) injection  40 mg Subcutaneous  Q24H  . feeding supplement  1 Container Oral TID BM  . FLUoxetine  20 mg Oral Daily  . guaiFENesin  1,200 mg Oral BID  . insulin aspart  0-15 Units Subcutaneous TID WC  . ipratropium-albuterol  3 mL Nebulization TID  . Ledipasvir-Sofosbuvir  1 tablet Oral Daily  . levothyroxine  25 mcg Oral QAC breakfast  . methylPREDNISolone (SOLU-MEDROL) injection  40 mg Intravenous Q12H  . multivitamin with minerals  1 tablet Oral Daily  . oxyCODONE  20 mg Oral Q8H  . polyethylene glycol  17 g Oral Daily   Continuous:  [id number], albuterol, ALPRAZolam, nitroGLYCERIN, ondansetron, oxyCODONE-acetaminophen **AND** oxyCODONE, polyvinyl alcohol, prochlorperazine  Assesment: He was admitted with acute on chronic hypoxic/hypercapnic respiratory failure associated with COPD and healthcare associated pneumonia. It is felt that he is aspirating because he's had recurrent episodes of pneumonia. He is improving with treatment. He has multiple other medical problems including esophageal cancer diabetes chronic hepatitis C and he had significant altered mental status. All of that seems to be stable/improving Principal Problem:   Altered mental status Active Problems:   COPD (chronic obstructive pulmonary disease) (HCC)   Depression   Dysphagia   Esophageal cancer  (HCC)   Diabetes mellitus without complication, with long-term current use of insulin (HCC)   HCAP (healthcare-associated pneumonia)   Acute on chronic respiratory failure with hypoxia and hypercapnia (HCC)   Anxiety   Chronic pain   Hypothyroidism   Acute encephalopathy   Hepatitis C, chronic (HCC)   Hypercarbia    Plan: Continue current treatments    LOS: 2 days   Aniah Pauli L 08/12/2015, 8:20 AM

## 2015-08-13 ENCOUNTER — Encounter (HOSPITAL_COMMUNITY): Payer: Medicaid Other

## 2015-08-13 ENCOUNTER — Ambulatory Visit (HOSPITAL_COMMUNITY): Admission: RE | Admit: 2015-08-13 | Payer: Medicaid Other | Source: Ambulatory Visit

## 2015-08-13 ENCOUNTER — Other Ambulatory Visit (HOSPITAL_COMMUNITY): Payer: Medicaid Other

## 2015-08-13 DIAGNOSIS — J690 Pneumonitis due to inhalation of food and vomit: Principal | ICD-10-CM

## 2015-08-13 LAB — GLUCOSE, CAPILLARY
Glucose-Capillary: 127 mg/dL — ABNORMAL HIGH (ref 65–99)
Glucose-Capillary: 122 mg/dL — ABNORMAL HIGH (ref 65–99)

## 2015-08-13 MED ORDER — GUAIFENESIN ER 600 MG PO TB12: 600.0000 mg | ORAL_TABLET | Freq: Two times a day (BID) | ORAL | Status: AC

## 2015-08-13 MED ORDER — AMOXICILLIN-POT CLAVULANATE 875-125 MG PO TABS
1.0000 | ORAL_TABLET | Freq: Two times a day (BID) | ORAL | Status: AC
Start: 2015-08-13 — End: ?

## 2015-08-13 MED ORDER — PREDNISONE 10 MG PO TABS: 20.0000 mg | ORAL_TABLET | Freq: Every day | ORAL | Status: AC

## 2015-08-13 NOTE — Progress Notes (Signed)
Patient alert and oriented, independent, VSS, pt. Tolerating diet well. No complaints of pain or nausea. Pt. Had IV removed tip intact. Pt. Had prescriptions given. Pt. Voiced understanding of discharge instructions with no further questions. Pt. Discharged via wheelchair with auxilliary. Pt will receive home health service at home where he lives with his wife.
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
## 2015-08-13 NOTE — Progress Notes (Addendum)
Patient is refusing to take antibiotics and scheduled pain medication this AM.  Patient is also refusing to leave is oxygen on.  On call MD paged.  Patients oxygen saturation is currently 97 percent, will continue to monitor and will pass to oncoming nurse.

## 2015-08-13 NOTE — Care Management Note (Signed)
Case Management Note  Patient Details  Name: DEMETERIUS KREISMAN MRN: [id number] Date of Birth: [birth year]    Expected Discharge Date:  08/13/15               Expected Discharge Plan:  [place]  In-House Referral:  NA  Discharge planning Services  CM Consult  Post Acute Care Choice:  Home Health Choice offered to:  Patient  DME Arranged:    DME Agency:  [place]:  RN [facility] Agency:     Status of Service:  Completed, signed off  Medicare Important Message Given:    Date Medicare IM Given:    Medicare IM give by:    Date Additional Medicare IM Given:    Additional Medicare Important Message give by:     If discussed at [place] of Stay Meetings, dates discussed:    Additional Comments: Spoke with patient's wife- Rosanne Ashing- she is agreeable to having home health RN. Would like to use [facility]. Will contact [facility] with [facility], she will obtain information from chart. Anticipated DC today. Wife aware that [facility] has 48 hours to make first visit.   Beverly Ferner, [place], RN 08/13/2015, 11:14 AM

## 2015-08-13 NOTE — Discharge Summary (Addendum)
Physician Discharge Summary  Brent Franco [id number] DOB: 05/27/1950 DOA: 08/09/2015  PCP: [name], NP  Admit date: 08/09/2015 Discharge date: 08/13/2015  Time spent: 35 minutes  Recommendations for Outpatient Follow-up:  1. Reschedule PET scan at [facility] long for oncology 2. Patient will be discharged with [place] and pursue MBSS as outpatient. 3. Follow up with PCP in 1-2 weeks    Discharge Diagnoses:  Principal Problem:   Altered mental status Active Problems:   COPD (chronic obstructive pulmonary disease) (HCC)   Depression   Dysphagia   Esophageal cancer (HCC)   Diabetes mellitus without complication, with long-term current use of insulin (HCC)   Aspiration pneumonia (HCC)   Acute on chronic respiratory failure with hypoxia and hypercapnia (HCC)   Anxiety   Chronic pain   Hypothyroidism   Acute encephalopathy   Hepatitis C, chronic (HCC)   Hypercarbia Moderate malnutrition  Discharge Condition: improved  Diet recommendation: Carb Modified, dysphagia 2 diet with thin liquids  Filed Weights   08/09/15 1343 08/10/15 0500 08/11/15 0400  Weight: 72.576 kg (160 lb) 74.3 kg (163 lb 12.8 oz) 73.9 kg (162 lb 14.7 oz)    History of present illness:  32 yom with a hx of COPD with supplemental O2 at home, depression, anxiety, chronic Hep C, and esophageal cancer with suspected metastases, presented to the ED with complaints of confusion, cough, and hypoxia. He has been hospitalized multiple times recently, including one for HCAP 3 weeks ago. He has noted a significant decline in his overall health. While being seen at the cancer center for a regularly scheduled appointment, he was found to be confused and saturating at 86%, and was subsequently sent to the ED. Upon arrival in the ED, CBC was notable for leukocytosis and stable normocytic anemia. ABG showed mild respiratory acidosis with hypoemia. He was placed on BiPAP with significant improved in mental status. He was also  started on cefepime and vancomycin. He remained hemodynamically stable and was admitted to stepdown for further management of acute encephalopathy, suspected secondary to hypercapnia in the setting of HCAP with underlying COPD and chronic hypoxic respiratory failure.   Hospital Course:  While in the ED, the patient was found to have acute encephalopathy, secondary to hypercarbia and hypoxia in the setting of PNA and COPD. He was subsequently started on BiPAP with significant improvement in his mental status. Initial head CT showed no acute findings, but a CXR revealed persistent PNA, so he was treated with nebs, Cefepime, and Vancomycin. Due to the recurrent nature of his PNA, there was concern for underlying aspiration. Therefore abx were changed to Unasyn. From a respiratory standopoint he has significantly improved. He was weaned off of BiPAP and is currently on his baseline O2 requirement. He was followed by pulmonology during his hospital stay. Discussed with Dr. Luan Pulling and will transition abx to Augmentin. Patient has had episodes of confusion during his hospital stay, but workup has been unremarkable and these episodes have been self limited. He may have some underlying cognitive impairment. He appears to be approaching his baseline level of functioning, and can be discharged home.   Procedures:  BiPAP  Consultations:  Pulmonology  Discharge Exam: Filed Vitals:   08/13/15 0600 08/13/15 1500  BP: 138/79 128/74  Pulse: 75 75  Temp: 97.5 F (36.4 C) 97.6 F (36.4 C)  Resp: 20 16    Examination:  General exam: Appears calm and comfortable  Respiratory system: Clear to auscultation. Respiratory effort normal. Cardiovascular system: S1 &  S2 heard, RRR. No JVD, murmurs, rubs, gallops or clicks. No pedal edema. Gastrointestinal system: Abdomen is nondistended, soft and nontender. No organomegaly or masses felt. Normal bowel sounds heard. Central nervous system: Alert and oriented. No  focal neurological deficits. Extremities: Symmetric 5 x 5 power. Skin: No rashes, lesions or ulcers Psychiatry: Judgement and insight appear normal. Mood & affect appropriate.   Discharge Instructions   Discharge Instructions    Diet - low sodium heart healthy    Complete by:  As directed      Face-to-face encounter (required for Medicare/Medicaid patients)    Complete by:  As directed   I MEMON,JEHANZEB certify that this patient is under my care and that I, or a nurse practitioner or physician's assistant working with me, had a face-to-face encounter that meets the physician face-to-face encounter requirements with this patient on 08/13/2015. The encounter with the patient was in whole, or in part for the following medical condition(s) which is the primary reason for home health care (List medical condition): aspiration pneumonia  The encounter with the patient was in whole, or in part, for the following medical condition, which is the primary reason for home health care:  aspiration pneumonia  I certify that, based on my findings, the following services are medically necessary home health services:   Nursing Speech language pathology    Reason for Medically Necessary Home Health Services:  Skilled Nursing- Teaching of Disease Process/Symptom Management  My clinical findings support the need for the above services:  Shortness of breath with activity  Further, I certify that my clinical findings support that this patient is homebound due to:  Shortness of Breath with activity     Home Health    Complete by:  As directed   To provide the following care/treatments:   SLP RN       Increase activity slowly    Complete by:  As directed           Current Discharge Medication List    START taking these medications   Details  amoxicillin-clavulanate (AUGMENTIN) 875-125 MG tablet Take 1 tablet by mouth 2 (two) times daily. Qty: 10 tablet, Refills: 0    guaiFENesin (MUCINEX) 600 MG 12 hr  tablet Take 1 tablet (600 mg total) by mouth 2 (two) times daily. Qty: 30 tablet, Refills: 0    predniSONE (DELTASONE) 10 MG tablet Take 2 tablets (20 mg total) by mouth daily with breakfast. Take 40mg  po daily on day 1 and decrease by 10mg  daily until complete Qty: 10 tablet, Refills: 0      CONTINUE these medications which have NOT CHANGED   Details  ACCU-CHEK AVIVA PLUS test strip 2 (two) times daily. use for testing Refills: 2    acetaminophen (TYLENOL) 500 MG tablet Take 500 mg by mouth every 6 (six) hours as needed for fever.    albuterol (PROVENTIL HFA;VENTOLIN HFA) 108 (90 BASE) MCG/ACT inhaler Inhale 1-2 puffs into the lungs every 6 (six) hours as needed for wheezing or shortness of breath.     ALPRAZolam (XANAX) 1 MG tablet Take 1 tablet (1 mg total) by mouth every 8 (eight) hours as needed for anxiety. Qty: 90 tablet, Refills: 1   Associated Diagnoses: Depression    docusate sodium (COLACE) 100 MG capsule Take 100 mg by mouth 2 (two) times daily. Refills: 4    FLUoxetine (PROZAC) 20 MG capsule Take 1 capsule (20 mg total) by mouth daily. Qty: 30 capsule, Refills: 3   Associated  Diagnoses: Depression    HUMULIN R 100 UNIT/ML injection Inject 2-12 Units into the skin 2 (two) times daily as needed for high blood sugar. 151-200 = 2 units, 201-250 = 4 units, 251-300 = 6 units, 301-350 = 8 units, 351-400 = 10 units, 401-450 = 12 units. Refills: 2    ipratropium-albuterol (DUONEB) 0.5-2.5 (3) MG/3ML SOLN INHALE CONTENTS OF 1 VIAL IN NEBULIZER EVERY 4 HOURS AS NEEDED FOR SHORTNESS OF BREATH. Refills: 3    Ledipasvir-Sofosbuvir (HARVONI) 90-400 MG TABS Take 1 tablet by mouth daily.     levothyroxine (SYNTHROID, LEVOTHROID) 25 MCG tablet Take 25 mcg by mouth daily. Refills: 5    lidocaine (XYLOCAINE) 2 % solution Reported on 07/24/2015 Refills: 3    lidocaine-prilocaine (EMLA) cream Apply a quarter size amount to port site 1 hour prior to chemo. Do not rub in. Cover with  plastic wrap. Qty: 30 g, Refills: 3   Associated Diagnoses: Mucosal abnormality of esophagus; Malignant neoplasm of lower third of esophagus (HCC)    meloxicam (MOBIC) 7.5 MG tablet TAKE ONE TABLET BY MOUTH ONCE DAILY Refills: 6    metFORMIN (GLUCOPHAGE) 500 MG tablet Take 500 mg by mouth 2 (two) times daily. Refills: 3    nitroGLYCERIN (NITROSTAT) 0.4 MG SL tablet Place 1 tablet (0.4 mg total) under the tongue every 5 (five) minutes as needed for chest pain. Qty: 25 tablet, Refills: 3    ondansetron (ZOFRAN) 8 MG tablet TAKE 1 TABLET (8 MG TOTAL) BY MOUTH EVERY 8 (EIGHT) HOURS AS NEEDED FOR NAUSEA OR VOMITING. Qty: 30 tablet, Refills: 2    oxyCODONE (OXYCONTIN) 20 mg 12 hr tablet Take 1 tablet (20 mg total) by mouth every 8 (eight) hours. Qty: 90 tablet, Refills: 0   Associated Diagnoses: Malignant neoplasm of lower third of esophagus (HCC)    oxyCODONE-acetaminophen (PERCOCET) 10-325 MG tablet Take 1 tablet by mouth every 4 (four) hours as needed for pain. Qty: 90 tablet, Refills: 0    prochlorperazine (COMPAZINE) 10 MG tablet TAKE 1 TABLET (10 MG TOTAL) BY MOUTH EVERY 6 (SIX) HOURS AS NEEDED FOR NAUSEA OR VOMITING. Qty: 30 tablet, Refills: 2    SPIRIVA HANDIHALER 18 MCG inhalation capsule Place 1 puff into inhaler and inhale daily. Refills: 5    sucralfate (CARAFATE) 1 g tablet Take 1 tablet by mouth 2 (two) times daily as needed (throat pain).  Refills: 5   Associated Diagnoses: Malignant neoplasm of lower third of esophagus (HCC)    tetrahydrozoline 0.05 % ophthalmic solution Place 1 drop into both eyes daily as needed (dry eyes).       Allergies  Allergen Reactions  . Demerol [Meperidine] Rash      The results of significant diagnostics from this hospitalization (including imaging, microbiology, ancillary and laboratory) are listed below for reference.    Significant Diagnostic Studies: Dg Chest 2 View  08/09/2015  CLINICAL DATA:  Weakness. History of esophageal  cancer. Recently treated for pneumonia. EXAM: CHEST  2 VIEW COMPARISON:  07/16/2015 FINDINGS: Right-sided injectable port is in stable position. Cardiomediastinal silhouette is normal. Mediastinal contours appear intact. There is no evidence of pleural effusion or pneumothorax. There are persistent areas of subtle airspace consolidation in the left lung base and less so in the right lung base. Osseous structures are without acute abnormality. Soft tissues are grossly normal. IMPRESSION: Persistent airspace consolidation in the left lung base and less so in the right lung base concerning for ongoing pneumonia. Electronically Signed   By: Thomas Hoff  Dimitrova M.D.   On: 08/09/2015 14:26   Dg Chest 2 View  07/16/2015  CLINICAL DATA:  Shortness breath with cough and congestion since yesterday. History of COPD and esophageal cancer. EXAM: CHEST  2 VIEW COMPARISON:  07/08/2013, 07/08/2015 and 07/07/2015 FINDINGS: The right jugular Port-A-Cath is stable and positioned at the superior cavoatrial junction. There are increased densities at the left lung base concerning for airspace disease. Again noted are densities along the medial right lung base but there are increased densities along the right costophrenic angle. Few patchy densities along the periphery of the right upper lung may also be new or enlarged. Heart size is stable. The trachea is midline. No large pleural effusions. No suspicious bone findings. IMPRESSION: Increased densities at both lung bases, left side greater than right. Findings are concerning for pneumonia. Slightly increased disease in the right upper lung. Electronically Signed   By: Markus Daft M.D.   On: 07/16/2015 12:56   Ct Head Wo Contrast  08/09/2015  CLINICAL DATA:  Altered mental status.  Esophageal carcinoma. EXAM: CT HEAD WITHOUT CONTRAST TECHNIQUE: Contiguous axial images were obtained from the base of the skull through the vertex without intravenous contrast. COMPARISON:  None. FINDINGS:  The ventricles are normal in size and configuration. There is no intracranial mass, hemorrhage, extra-axial fluid collection, or midline shift. The gray-white compartments appear normal. No acute infarct evident. The bony calvarium appears intact. The mastoid air cells are clear. Orbits appear symmetric bilaterally. IMPRESSION: Study within normal limits. Electronically Signed   By: Lowella Grip III M.D.   On: 08/09/2015 15:57   Ct Chest W Contrast  08/10/2015  CLINICAL DATA:  Pneumonia. History of COPD. Esophageal cancer. Pulmonary nodules. EXAM: CT CHEST WITH CONTRAST TECHNIQUE: Multidetector CT imaging of the chest was performed during intravenous contrast administration. CONTRAST:  48mL ISOVUE-300 IOPAMIDOL (ISOVUE-300) INJECTION 61% COMPARISON:  07/08/2015 FINDINGS: Mediastinum/Nodes: Heart is normal size. Aorta is normal caliber. Scattered calcifications in the left anterior descending and right coronary arteries. No definite mediastinal, hilar or axillary adenopathy. Distal esophageal wall thickening again noted and stable in area of known mass. Lungs/Pleura: Airspace disease again noted in the lower lobes with ground-glass opacities and air bronchograms, slightly improved since prior study. Right middle lobe and lingular consolidation also partially improved. Ground-glass opacities in the upper lobes also improved but persistent. Small right upper lobe nodule measures 9 mm compared to 10 mm previously, not significantly changed. Other scattered small pulmonary nodules unchanged. No pleural effusions. Upper abdomen: Subtle area of low-density in the posterior right hepatic lobe again noted. This may have enlarged in size but is difficult to visualize on this chest CT. Musculoskeletal: No acute bony abnormality or focal bone lesion. IMPRESSION: Improving bilateral pneumonia since prior study. There continue be patchy bilateral ground-glass and more consolidative airspace opacities in the lower lobes  with ground-glass opacities in both upper lobes. Improving but persistent opacities in the lingula and right middle lobe as well. Small scattered pulmonary nodules, the largest 9 mm in the right upper lobe, stable. Vague ill-defined low-density area posteriorly in the right hepatic lobe has slightly enlarged since prior MRI from 04/30/2015, but difficult to visualize well on this chest CT. Recommend attention on follow-up abdominal imaging. Coronary artery disease. Electronically Signed   By: Rolm Baptise M.D.   On: 08/10/2015 13:50    Microbiology: Recent Results (from the past 240 hour(s))  Urine culture     Status: None   Collection Time: 08/09/15  3:00 PM  Result Value Ref Range Status   Specimen Description URINE, CATHETERIZED  Final   Special Requests NONE  Final   Culture NO GROWTH Performed at [facility]   Final   Report Status 08/11/2015 FINAL  Final  Culture, blood (routine x 2) Call MD if unable to obtain prior to antibiotics being given     Status: None (Preliminary result)   Collection Time: 08/09/15  5:10 PM  Result Value Ref Range Status   Specimen Description BLOOD RIGHT HAND  Final   Special Requests   Final    BOTTLES DRAWN AEROBIC AND ANAEROBIC AEB=6CC ANA=5CC   Culture NO GROWTH 4 DAYS  Final   Report Status PENDING  Incomplete  Culture, blood (routine x 2) Call MD if unable to obtain prior to antibiotics being given     Status: None (Preliminary result)   Collection Time: 08/09/15  5:16 PM  Result Value Ref Range Status   Specimen Description BLOOD RIGHT HAND  Final   Special Requests BOTTLES DRAWN AEROBIC AND ANAEROBIC 6CC EACH  Final   Culture NO GROWTH 4 DAYS  Final   Report Status PENDING  Incomplete  MRSA PCR Screening     Status: None   Collection Time: 08/09/15  6:05 PM  Result Value Ref Range Status   MRSA by PCR NEGATIVE NEGATIVE Final    Comment:        The GeneXpert MRSA Assay (FDA approved for NASAL specimens only), is one component of  a comprehensive MRSA colonization surveillance program. It is not intended to diagnose MRSA infection nor to guide or monitor treatment for MRSA infections.      Labs: Basic Metabolic Panel:  Recent Labs Lab 08/09/15 1442 08/10/15 0436 08/11/15 0409 08/12/15 0601  NA 136 138 137 137  K 4.2 4.0 4.2 4.1  CL 98* 100* 98* 98*  CO2 31 33* 32 31  GLUCOSE 192* 101* 124* 150*  BUN 17 12 12 16   CREATININE 1.16 0.96 0.82 0.80  CALCIUM 8.5* 8.5* 8.6* 8.5*   Liver Function Tests:  Recent Labs Lab 08/09/15 1442 08/10/15 0436  AST 50* 41  ALT 30 25  ALKPHOS 162* 143*  BILITOT 0.7 0.7  PROT 7.6 7.2  ALBUMIN 3.1* 2.8*    Recent Labs Lab 08/09/15 1441 08/10/15 1458  AMMONIA 29 30   CBC:  Recent Labs Lab 08/09/15 1442 08/10/15 0436 08/11/15 0409 08/12/15 0601  WBC 12.4* 7.9 9.3 12.4*  NEUTROABS 9.9* 5.7  --   --   HGB 11.4* 10.8* 11.2* 10.8*  HCT 36.3* 34.8* 34.4* 33.6*  MCV 97.3 98.3 95.8 94.4  PLT 159 153 157 200   Cardiac Enzymes:  Recent Labs Lab 08/09/15 1442  TROPONINI <0.03   BNP: BNP (last 3 results)  Recent Labs  08/09/15 1443  BNP 215.0*   CBG:  Recent Labs Lab 08/12/15 1142 08/12/15 1652 08/12/15 2037 08/13/15 0755 08/13/15 1129  GLUCAP 155* 210* 249* 127* 122*   Signed:  Kathie Dike, MD.  [facility] 08/13/2015, 3:27 PM  By signing my name below, I, Delene Ruffini, attest that this documentation has been prepared under the direction and in the presence of Kathie Dike, MD. Electronically Signed: Delene Ruffini 08/13/2015 2:45pm  I, Dr. Kathie Dike, personally performed the services described in this documentaiton. All medical record entries made by the scribe were at my direction and in my presence. I have reviewed the chart and agree that the record reflects my personal performance and is accurate  and complete  Kathie Dike, MD, 08/13/2015 3:27 PM

## 2015-08-13 NOTE — Progress Notes (Addendum)
Pt. Refusing all medication and will only use oxygen intermittent. Dr. Roderic Palau notified. Will continue to monitor.

## 2015-08-14 ENCOUNTER — Ambulatory Visit (HOSPITAL_COMMUNITY): Payer: Medicaid Other | Admitting: Hematology & Oncology

## 2015-08-14 ENCOUNTER — Telehealth (HOSPITAL_COMMUNITY): Payer: Self-pay | Admitting: *Deleted

## 2015-08-14 LAB — CULTURE, BLOOD (ROUTINE X 2)
CULTURE: NO GROWTH
Culture: NO GROWTH

## 2015-08-14 NOTE — Progress Notes (Signed)
This encounter was created in error - please disregard.

## 2015-08-15 ENCOUNTER — Other Ambulatory Visit (HOSPITAL_COMMUNITY): Payer: Self-pay | Admitting: Oncology

## 2015-08-15 DIAGNOSIS — C155 Malignant neoplasm of lower third of esophagus: Secondary | ICD-10-CM

## 2015-08-21 ENCOUNTER — Other Ambulatory Visit (HOSPITAL_COMMUNITY): Payer: Self-pay | Admitting: Oncology

## 2015-08-21 ENCOUNTER — Telehealth (HOSPITAL_COMMUNITY): Payer: Self-pay | Admitting: *Deleted

## 2015-08-21 DIAGNOSIS — C155 Malignant neoplasm of lower third of esophagus: Secondary | ICD-10-CM

## 2015-08-21 MED ORDER — OXYCODONE-ACETAMINOPHEN 10-325 MG PO TABS: 1.0000 | ORAL_TABLET | ORAL | Status: AC | PRN

## 2015-08-21 MED ORDER — OXYCODONE HCL ER 20 MG PO T12A: 20.0000 mg | EXTENDED_RELEASE_TABLET | Freq: Three times a day (TID) | ORAL | Status: AC

## 2015-08-21 NOTE — Telephone Encounter (Signed)
Rx printed.  TK 

## 2015-08-23 ENCOUNTER — Telehealth (HOSPITAL_COMMUNITY): Payer: Self-pay | Admitting: *Deleted

## 2015-08-24 ENCOUNTER — Emergency Department (HOSPITAL_COMMUNITY): Payer: Medicaid Other

## 2015-08-24 ENCOUNTER — Encounter (HOSPITAL_COMMUNITY): Payer: Self-pay | Admitting: *Deleted

## 2015-08-24 ENCOUNTER — Inpatient Hospital Stay (HOSPITAL_COMMUNITY): Admission: RE | Admit: 2015-08-24 | Payer: Medicaid Other | Source: Ambulatory Visit

## 2015-08-24 ENCOUNTER — Inpatient Hospital Stay (HOSPITAL_COMMUNITY)
Admission: EM | Admit: 2015-08-24 | Discharge: 2015-09-08 | DRG: 871 | Disposition: E | Payer: Medicaid Other | Attending: Internal Medicine | Admitting: Internal Medicine

## 2015-08-24 ENCOUNTER — Ambulatory Visit (HOSPITAL_COMMUNITY): Admission: RE | Admit: 2015-08-24 | Payer: Medicaid Other | Source: Ambulatory Visit

## 2015-08-24 ENCOUNTER — Ambulatory Visit (HOSPITAL_COMMUNITY): Payer: Medicaid Other

## 2015-08-24 DIAGNOSIS — Z7189 Other specified counseling: Secondary | ICD-10-CM | POA: Insufficient documentation

## 2015-08-24 DIAGNOSIS — J9622 Acute and chronic respiratory failure with hypercapnia: Secondary | ICD-10-CM | POA: Diagnosis present

## 2015-08-24 DIAGNOSIS — J449 Chronic obstructive pulmonary disease, unspecified: Secondary | ICD-10-CM | POA: Diagnosis present

## 2015-08-24 DIAGNOSIS — E119 Type 2 diabetes mellitus without complications: Secondary | ICD-10-CM | POA: Diagnosis present

## 2015-08-24 DIAGNOSIS — E089 Diabetes mellitus due to underlying condition without complications: Secondary | ICD-10-CM | POA: Diagnosis not present

## 2015-08-24 DIAGNOSIS — E870 Hyperosmolality and hypernatremia: Secondary | ICD-10-CM | POA: Diagnosis not present

## 2015-08-24 DIAGNOSIS — Z791 Long term (current) use of non-steroidal anti-inflammatories (NSAID): Secondary | ICD-10-CM

## 2015-08-24 DIAGNOSIS — J690 Pneumonitis due to inhalation of food and vomit: Secondary | ICD-10-CM | POA: Diagnosis present

## 2015-08-24 DIAGNOSIS — Z9221 Personal history of antineoplastic chemotherapy: Secondary | ICD-10-CM | POA: Diagnosis not present

## 2015-08-24 DIAGNOSIS — Z515 Encounter for palliative care: Secondary | ICD-10-CM | POA: Diagnosis present

## 2015-08-24 DIAGNOSIS — R680 Hypothermia, not associated with low environmental temperature: Secondary | ICD-10-CM | POA: Diagnosis present

## 2015-08-24 DIAGNOSIS — Z79891 Long term (current) use of opiate analgesic: Secondary | ICD-10-CM

## 2015-08-24 DIAGNOSIS — R5383 Other fatigue: Secondary | ICD-10-CM | POA: Diagnosis present

## 2015-08-24 DIAGNOSIS — Z923 Personal history of irradiation: Secondary | ICD-10-CM

## 2015-08-24 DIAGNOSIS — C159 Malignant neoplasm of esophagus, unspecified: Secondary | ICD-10-CM | POA: Diagnosis present

## 2015-08-24 DIAGNOSIS — Z7951 Long term (current) use of inhaled steroids: Secondary | ICD-10-CM

## 2015-08-24 DIAGNOSIS — Z9981 Dependence on supplemental oxygen: Secondary | ICD-10-CM

## 2015-08-24 DIAGNOSIS — B182 Chronic viral hepatitis C: Secondary | ICD-10-CM | POA: Diagnosis present

## 2015-08-24 DIAGNOSIS — Z800 Family history of malignant neoplasm of digestive organs: Secondary | ICD-10-CM | POA: Diagnosis not present

## 2015-08-24 DIAGNOSIS — T68XXXA Hypothermia, initial encounter: Secondary | ICD-10-CM

## 2015-08-24 DIAGNOSIS — A419 Sepsis, unspecified organism: Secondary | ICD-10-CM | POA: Diagnosis present

## 2015-08-24 DIAGNOSIS — Z86010 Personal history of colonic polyps: Secondary | ICD-10-CM

## 2015-08-24 DIAGNOSIS — Z794 Long term (current) use of insulin: Secondary | ICD-10-CM

## 2015-08-24 DIAGNOSIS — G9341 Metabolic encephalopathy: Secondary | ICD-10-CM | POA: Diagnosis present

## 2015-08-24 DIAGNOSIS — J438 Other emphysema: Secondary | ICD-10-CM | POA: Diagnosis not present

## 2015-08-24 DIAGNOSIS — Z87891 Personal history of nicotine dependence: Secondary | ICD-10-CM

## 2015-08-24 DIAGNOSIS — R1310 Dysphagia, unspecified: Secondary | ICD-10-CM | POA: Diagnosis present

## 2015-08-24 DIAGNOSIS — F329 Major depressive disorder, single episode, unspecified: Secondary | ICD-10-CM | POA: Diagnosis present

## 2015-08-24 DIAGNOSIS — J9621 Acute and chronic respiratory failure with hypoxia: Secondary | ICD-10-CM | POA: Diagnosis present

## 2015-08-24 DIAGNOSIS — R7989 Other specified abnormal findings of blood chemistry: Secondary | ICD-10-CM

## 2015-08-24 DIAGNOSIS — Z66 Do not resuscitate: Secondary | ICD-10-CM | POA: Diagnosis present

## 2015-08-24 DIAGNOSIS — Z801 Family history of malignant neoplasm of trachea, bronchus and lung: Secondary | ICD-10-CM | POA: Diagnosis not present

## 2015-08-24 DIAGNOSIS — R451 Restlessness and agitation: Secondary | ICD-10-CM | POA: Diagnosis present

## 2015-08-24 DIAGNOSIS — E039 Hypothyroidism, unspecified: Secondary | ICD-10-CM | POA: Diagnosis present

## 2015-08-24 DIAGNOSIS — J189 Pneumonia, unspecified organism: Secondary | ICD-10-CM

## 2015-08-24 DIAGNOSIS — G9340 Encephalopathy, unspecified: Secondary | ICD-10-CM

## 2015-08-24 LAB — BLOOD GAS, ARTERIAL
ACID-BASE DEFICIT: 1 mmol/L (ref 0.0–2.0)
ACID-BASE DEFICIT: 0.1 mmol/L (ref 0.0–2.0)
ACID-BASE DEFICIT: 0.6 mmol/L (ref 0.0–2.0)
Allens test (pass/fail): POSITIVE — AB
BICARBONATE: 23.3 meq/L (ref 20.0–24.0)
BICARBONATE: 23.5 meq/L (ref 20.0–24.0)
Bicarbonate: 23 mEq/L (ref 20.0–24.0)
DELIVERY SYSTEMS: POSITIVE
Drawn by: 22179
Drawn by: 221791
EXPIRATORY PAP: 8
FIO2: 1
FIO2: 40
Inspiratory PAP: 16
LHR: 12 {breaths}/min
O2 CONTENT: 8 L/min
O2 SAT: 97.4 %
O2 SAT: 94.6 %
O2 Saturation: 96.2 %
PATIENT TEMPERATURE: 37
PATIENT TEMPERATURE: 37
PCO2 ART: 51.4 mmHg — AB (ref 35.0–45.0)
PH ART: 7.317 — AB (ref 7.350–7.450)
PO2 ART: 93.3 mmHg (ref 80.0–100.0)
PO2 ART: 78.1 mmHg — AB (ref 80.0–100.0)
Patient temperature: 37
TCO2: 12.6 mmol/L (ref 0–100)
TCO2: 12.3 mmol/L (ref 0–100)
pCO2 arterial: 70.3 mmHg (ref 35.0–45.0)
pCO2 arterial: 49.9 mmHg — ABNORMAL HIGH (ref 35.0–45.0)
pH, Arterial: 7.301 — ABNORMAL LOW (ref 7.350–7.450)
pH, Arterial: 7.209 — ABNORMAL LOW (ref 7.350–7.450)
pO2, Arterial: 79.5 mmHg — ABNORMAL LOW (ref 80.0–100.0)

## 2015-08-24 LAB — URINALYSIS, ROUTINE W REFLEX MICROSCOPIC
BILIRUBIN URINE: NEGATIVE
GLUCOSE, UA: NEGATIVE mg/dL
HGB URINE DIPSTICK: NEGATIVE
KETONES UR: NEGATIVE mg/dL
Leukocytes, UA: NEGATIVE
Nitrite: NEGATIVE
PROTEIN: 30 mg/dL — AB
Specific Gravity, Urine: >1.03 — ABNORMAL HIGH (ref 1.005–1.030)
pH: 5.5 (ref 5.0–8.0)

## 2015-08-24 LAB — URINE MICROSCOPIC-ADD ON
RBC / HPF: NONE SEEN RBC/hpf (ref 0–5)
SQUAMOUS EPITHELIAL / LPF: NONE SEEN

## 2015-08-24 LAB — CBC WITH DIFFERENTIAL/PLATELET
BASOS ABS: 0 10*3/uL (ref 0.0–0.1)
Basophils Relative: 0 %
EOS ABS: 0 10*3/uL (ref 0.0–0.7)
EOS PCT: 0 %
HCT: 33.7 % — ABNORMAL LOW (ref 39.0–52.0)
HEMOGLOBIN: 10.5 g/dL — AB (ref 13.0–17.0)
LYMPHS PCT: 6 %
Lymphs Abs: 1.1 10*3/uL (ref 0.7–4.0)
MCH: 31 pg (ref 26.0–34.0)
MCHC: 31.2 g/dL (ref 30.0–36.0)
MCV: 99.4 fL (ref 78.0–100.0)
Monocytes Absolute: 0.5 10*3/uL (ref 0.1–1.0)
Monocytes Relative: 3 %
NEUTROS PCT: 91 %
Neutro Abs: 16.1 10*3/uL — ABNORMAL HIGH (ref 1.7–7.7)
PLATELETS: 225 10*3/uL (ref 150–400)
RBC: 3.39 MIL/uL — AB (ref 4.22–5.81)
RDW: 14.6 % (ref 11.5–15.5)
WBC: 17.7 10*3/uL — AB (ref 4.0–10.5)

## 2015-08-24 LAB — GLUCOSE, CAPILLARY
GLUCOSE-CAPILLARY: 125 mg/dL — AB (ref 65–99)
GLUCOSE-CAPILLARY: 158 mg/dL — AB (ref 65–99)
Glucose-Capillary: 112 mg/dL — ABNORMAL HIGH (ref 65–99)

## 2015-08-24 LAB — COMPREHENSIVE METABOLIC PANEL
ALK PHOS: 284 U/L — AB (ref 38–126)
ALT: 62 U/L (ref 17–63)
AST: 69 U/L — AB (ref 15–41)
Albumin: 2.6 g/dL — ABNORMAL LOW (ref 3.5–5.0)
Anion gap: 12 (ref 5–15)
BUN: 26 mg/dL — AB (ref 6–20)
CHLORIDE: 101 mmol/L (ref 101–111)
CO2: 25 mmol/L (ref 22–32)
CREATININE: 1.39 mg/dL — AB (ref 0.61–1.24)
Calcium: 8.4 mg/dL — ABNORMAL LOW (ref 8.9–10.3)
GFR calc Af Amer: >60 mL/min (ref >60)
GFR calc non Af Amer: 52 mL/min — ABNORMAL LOW (ref >60)
GLUCOSE: 181 mg/dL — AB (ref 65–99)
Potassium: 4.7 mmol/L (ref 3.5–5.1)
SODIUM: 138 mmol/L (ref 135–145)
Total Bilirubin: 1.1 mg/dL (ref 0.3–1.2)
Total Protein: 7.2 g/dL (ref 6.5–8.1)

## 2015-08-24 LAB — PROCALCITONIN: PROCALCITONIN: 2.37 ng/mL

## 2015-08-24 LAB — CREATININE, SERUM: Creatinine, Ser: 1.19 mg/dL (ref 0.61–1.24)

## 2015-08-24 LAB — BRAIN NATRIURETIC PEPTIDE: B NATRIURETIC PEPTIDE 5: 452 pg/mL — AB (ref 0.0–100.0)

## 2015-08-24 LAB — CBC
HEMATOCRIT: 29.4 % — AB (ref 39.0–52.0)
HEMOGLOBIN: 9.2 g/dL — AB (ref 13.0–17.0)
MCH: 31.1 pg (ref 26.0–34.0)
MCHC: 31.3 g/dL (ref 30.0–36.0)
MCV: 99.3 fL (ref 78.0–100.0)
Platelets: 182 10*3/uL (ref 150–400)
RBC: 2.96 MIL/uL — AB (ref 4.22–5.81)
RDW: 14.8 % (ref 11.5–15.5)
WBC: 13.1 10*3/uL — AB (ref 4.0–10.5)

## 2015-08-24 LAB — RAPID URINE DRUG SCREEN, HOSP PERFORMED
Amphetamines: NOT DETECTED
Barbiturates: NOT DETECTED
Benzodiazepines: POSITIVE — AB
Cocaine: NOT DETECTED
Opiates: POSITIVE — AB
Tetrahydrocannabinol: NOT DETECTED

## 2015-08-24 LAB — I-STAT CG4 LACTIC ACID, ED: Lactic Acid, Venous: 5.58 mmol/L (ref 0.5–2.0)

## 2015-08-24 LAB — AMMONIA: Ammonia: 49 umol/L — ABNORMAL HIGH (ref 9–35)

## 2015-08-24 LAB — CG4 I-STAT (LACTIC ACID): LACTIC ACID, VENOUS: 1.76 mmol/L (ref 0.5–2.0)

## 2015-08-24 LAB — ETHANOL: Alcohol, Ethyl (B): <5 mg/dL (ref <5)

## 2015-08-24 LAB — TROPONIN I: TROPONIN I: 0.04 ng/mL — AB (ref <0.031)

## 2015-08-24 MED ORDER — GUAIFENESIN ER 600 MG PO TB12
1200.0000 mg | ORAL_TABLET | Freq: Two times a day (BID) | ORAL | Status: DC
  Administered 2015-08-27: 1200 mg via ORAL
  Filled 2015-08-24: qty 2

## 2015-08-24 MED ORDER — PIPERACILLIN-TAZOBACTAM 3.375 G IVPB: 3.3750 g | Freq: Three times a day (TID) | INTRAVENOUS | Status: DC

## 2015-08-24 MED ORDER — ONDANSETRON HCL 4 MG/2ML IJ SOLN: 4.0000 mg | Freq: Four times a day (QID) | INTRAMUSCULAR | Status: DC | PRN

## 2015-08-24 MED ORDER — SODIUM CHLORIDE 0.9 % IV BOLUS (SEPSIS)
1000.0000 mL | Freq: Once | INTRAVENOUS | Status: AC
  Administered 2015-08-24: 1000 mL via INTRAVENOUS

## 2015-08-24 MED ORDER — SODIUM CHLORIDE 0.9 % IV SOLN
INTRAVENOUS | Status: DC
Start: 2015-08-24 — End: 2015-08-24
  Administered 2015-08-24: 06:00:00 via INTRAVENOUS

## 2015-08-24 MED ORDER — SODIUM CHLORIDE 0.9 % IV BOLUS (SEPSIS): 500.0000 mL | Freq: Once | INTRAVENOUS | Status: DC

## 2015-08-24 MED ORDER — VANCOMYCIN HCL IN DEXTROSE 1-5 GM/200ML-% IV SOLN
1000.0000 mg | Freq: Once | INTRAVENOUS | Status: AC
  Administered 2015-08-24: 1000 mg via INTRAVENOUS
  Filled 2015-08-24: qty 200

## 2015-08-24 MED ORDER — IPRATROPIUM-ALBUTEROL 0.5-2.5 (3) MG/3ML IN SOLN
3.0000 mL | RESPIRATORY_TRACT | Status: DC
  Administered 2015-08-24 (×2): 3 mL via RESPIRATORY_TRACT
  Filled 2015-08-24 (×2): qty 3

## 2015-08-24 MED ORDER — LORAZEPAM 2 MG/ML IJ SOLN
0.5000 mg | Freq: Once | INTRAMUSCULAR | Status: AC
Start: 2015-08-24 — End: 2015-08-24
  Administered 2015-08-24: 0.5 mg via INTRAVENOUS
  Filled 2015-08-24: qty 1

## 2015-08-24 MED ORDER — VANCOMYCIN HCL IN DEXTROSE 1-5 GM/200ML-% IV SOLN: 1000.0000 mg | Freq: Two times a day (BID) | INTRAVENOUS | Status: DC

## 2015-08-24 MED ORDER — CHLORHEXIDINE GLUCONATE 0.12 % MT SOLN
15.0000 mL | Freq: Two times a day (BID) | OROMUCOSAL | Status: DC
  Administered 2015-08-24 – 2015-08-27 (×8): 15 mL via OROMUCOSAL
  Filled 2015-08-24 (×2): qty 15

## 2015-08-24 MED ORDER — LEVOTHYROXINE SODIUM 25 MCG PO TABS: 25.0000 ug | ORAL_TABLET | Freq: Every day | ORAL | Status: DC

## 2015-08-24 MED ORDER — IPRATROPIUM-ALBUTEROL 0.5-2.5 (3) MG/3ML IN SOLN
3.0000 mL | Freq: Once | RESPIRATORY_TRACT | Status: AC
  Administered 2015-08-24: 3 mL via RESPIRATORY_TRACT
  Filled 2015-08-24: qty 3

## 2015-08-24 MED ORDER — FLUOXETINE HCL 20 MG PO CAPS: 20.0000 mg | ORAL_CAPSULE | Freq: Every day | ORAL | Status: DC

## 2015-08-24 MED ORDER — PIPERACILLIN-TAZOBACTAM 3.375 G IVPB
3.3750 g | Freq: Once | INTRAVENOUS | Status: AC
  Administered 2015-08-24: 3.375 g via INTRAVENOUS
  Filled 2015-08-24: qty 50

## 2015-08-24 MED ORDER — METHYLPREDNISOLONE SODIUM SUCC 125 MG IJ SOLR
60.0000 mg | Freq: Two times a day (BID) | INTRAMUSCULAR | Status: DC
  Administered 2015-08-24 – 2015-08-27 (×8): 60 mg via INTRAVENOUS
  Filled 2015-08-24 (×8): qty 2

## 2015-08-24 MED ORDER — VANCOMYCIN HCL 500 MG IV SOLR
500.0000 mg | Freq: Once | INTRAVENOUS | Status: AC
  Administered 2015-08-24: 500 mg via INTRAVENOUS
  Filled 2015-08-24: qty 500

## 2015-08-24 MED ORDER — ALPRAZOLAM 0.5 MG PO TABS
1.0000 mg | ORAL_TABLET | Freq: Three times a day (TID) | ORAL | Status: DC | PRN
  Administered 2015-08-27: 1 mg via ORAL
  Filled 2015-08-24: qty 2

## 2015-08-24 MED ORDER — INSULIN ASPART 100 UNIT/ML ~~LOC~~ SOLN
0.0000 [IU] | Freq: Three times a day (TID) | SUBCUTANEOUS | Status: DC
  Administered 2015-08-24: 2 [IU] via SUBCUTANEOUS
  Administered 2015-08-25 (×2): 3 [IU] via SUBCUTANEOUS
  Administered 2015-08-26: 2 [IU] via SUBCUTANEOUS
  Administered 2015-08-26 – 2015-08-27 (×3): 3 [IU] via SUBCUTANEOUS
  Administered 2015-08-27: 5 [IU] via SUBCUTANEOUS
  Administered 2015-08-27: 3 [IU] via SUBCUTANEOUS

## 2015-08-24 MED ORDER — IPRATROPIUM-ALBUTEROL 0.5-2.5 (3) MG/3ML IN SOLN
3.0000 mL | RESPIRATORY_TRACT | Status: DC
  Administered 2015-08-24 – 2015-08-28 (×18): 3 mL via RESPIRATORY_TRACT
  Filled 2015-08-24 (×19): qty 3

## 2015-08-24 MED ORDER — INSULIN ASPART 100 UNIT/ML ~~LOC~~ SOLN: 0.0000 [IU] | Freq: Every day | SUBCUTANEOUS | Status: DC

## 2015-08-24 MED ORDER — SODIUM CHLORIDE 0.9 % IV SOLN
INTRAVENOUS | Status: DC
  Administered 2015-08-24 – 2015-08-25 (×3): via INTRAVENOUS

## 2015-08-24 MED ORDER — ACETAMINOPHEN 650 MG RE SUPP: 650.0000 mg | Freq: Four times a day (QID) | RECTAL | Status: DC | PRN

## 2015-08-24 MED ORDER — SODIUM CHLORIDE 0.9 % IV SOLN: INTRAVENOUS | Status: AC

## 2015-08-24 MED ORDER — ONDANSETRON HCL 4 MG PO TABS: 4.0000 mg | ORAL_TABLET | Freq: Four times a day (QID) | ORAL | Status: DC | PRN

## 2015-08-24 MED ORDER — LEDIPASVIR-SOFOSBUVIR 90-400 MG PO TABS: 1.0000 | ORAL_TABLET | Freq: Every day | ORAL | Status: DC

## 2015-08-24 MED ORDER — ALBUTEROL SULFATE (2.5 MG/3ML) 0.083% IN NEBU
2.5000 mg | INHALATION_SOLUTION | RESPIRATORY_TRACT | Status: DC | PRN
  Administered 2015-08-27: 2.5 mg via RESPIRATORY_TRACT
  Filled 2015-08-24: qty 3

## 2015-08-24 MED ORDER — CETYLPYRIDINIUM CHLORIDE 0.05 % MT LIQD
7.0000 mL | Freq: Two times a day (BID) | OROMUCOSAL | Status: DC
  Administered 2015-08-25 – 2015-08-27 (×5): 7 mL via OROMUCOSAL

## 2015-08-24 MED ORDER — LEVOTHYROXINE SODIUM 100 MCG IV SOLR
12.5000 ug | Freq: Every day | INTRAVENOUS | Status: DC
  Administered 2015-08-25 – 2015-08-27 (×3): 12.5 ug via INTRAVENOUS
  Filled 2015-08-24 (×5): qty 5

## 2015-08-24 MED ORDER — ENOXAPARIN SODIUM 40 MG/0.4ML ~~LOC~~ SOLN
40.0000 mg | SUBCUTANEOUS | Status: DC
  Administered 2015-08-24 – 2015-08-27 (×4): 40 mg via SUBCUTANEOUS
  Filled 2015-08-24 (×5): qty 0.4

## 2015-08-24 MED ORDER — IOPAMIDOL (ISOVUE-370) INJECTION 76%
100.0000 mL | Freq: Once | INTRAVENOUS | Status: AC | PRN
  Administered 2015-08-24: 100 mL via INTRAVENOUS

## 2015-08-24 MED ORDER — HALOPERIDOL LACTATE 5 MG/ML IJ SOLN
5.0000 mg | Freq: Once | INTRAMUSCULAR | Status: AC
  Administered 2015-08-24: 5 mg via INTRAVENOUS
  Filled 2015-08-24: qty 1

## 2015-08-24 MED ORDER — LORAZEPAM 2 MG/ML IJ SOLN
1.0000 mg | Freq: Once | INTRAMUSCULAR | Status: AC
  Administered 2015-08-24: 1 mg via INTRAVENOUS
  Filled 2015-08-24: qty 1

## 2015-08-24 MED ORDER — MORPHINE SULFATE (PF) 2 MG/ML IV SOLN
1.0000 mg | INTRAVENOUS | Status: DC | PRN
  Administered 2015-08-24 – 2015-08-27 (×4): 1 mg via INTRAVENOUS
  Filled 2015-08-24 (×4): qty 1

## 2015-08-24 MED ORDER — ACETAMINOPHEN 325 MG PO TABS: 650.0000 mg | ORAL_TABLET | Freq: Four times a day (QID) | ORAL | Status: DC | PRN

## 2015-08-24 MED ORDER — SODIUM CHLORIDE 0.9 % IV SOLN
3.0000 g | Freq: Four times a day (QID) | INTRAVENOUS | Status: DC
  Administered 2015-08-24 – 2015-08-28 (×16): 3 g via INTRAVENOUS
  Filled 2015-08-24 (×17): qty 3

## 2015-08-24 NOTE — ED Notes (Addendum)
Pt agitated, bilateral wrist restraints and bear hugger applied

## 2015-08-24 NOTE — Progress Notes (Signed)
ANTIBIOTIC CONSULT NOTE  Pharmacy Consult for Vancomycin and Zosyn Indication: Sepsis  Allergies  Allergen Reactions  . Demerol [Meperidine] Rash   Patient Measurements: Weight: 162 lb (73.483 kg)  Vital Signs: Temp: 96.3 F (35.7 C) (06/16 0730) Temp Source: Rectal (06/16 0730) BP: 111/67 mmHg (06/16 0730) Pulse Rate: 118 (06/16 0730)  Labs:  Recent Labs  08/30/2015 0353  WBC 17.7*  HGB 10.5*  PLT 225  CREATININE 1.39*   Estimated Creatinine Clearance: 56.5 mL/min (by C-G formula based on Cr of 1.39).  No results for input(s): VANCOTROUGH, VANCOPEAK, VANCORANDOM, GENTTROUGH, GENTPEAK, GENTRANDOM, TOBRATROUGH, TOBRAPEAK, TOBRARND, AMIKACINPEAK, AMIKACINTROU, AMIKACIN in the last 72 hours.   Microbiology: Recent Results (from the past 720 hour(s))  Urine culture     Status: None   Collection Time: 08/09/15  3:00 PM  Result Value Ref Range Status   Specimen Description URINE, CATHETERIZED  Final   Special Requests NONE  Final   Culture NO GROWTH Performed at [facility]   Final   Report Status 08/11/2015 FINAL  Final  Culture, blood (routine x 2) Call MD if unable to obtain prior to antibiotics being given     Status: None   Collection Time: 08/09/15  5:10 PM  Result Value Ref Range Status   Specimen Description BLOOD RIGHT HAND  Final   Special Requests   Final    BOTTLES DRAWN AEROBIC AND ANAEROBIC AEB=6CC ANA=5CC   Culture NO GROWTH 5 DAYS  Final   Report Status 08/14/2015 FINAL  Final  Culture, blood (routine x 2) Call MD if unable to obtain prior to antibiotics being given     Status: None   Collection Time: 08/09/15  5:16 PM  Result Value Ref Range Status   Specimen Description BLOOD RIGHT HAND  Final   Special Requests BOTTLES DRAWN AEROBIC AND ANAEROBIC [place]  Final   Culture NO GROWTH 5 DAYS  Final   Report Status 08/14/2015 FINAL  Final  MRSA PCR Screening     Status: None   Collection Time: 08/09/15  6:05 PM  Result Value Ref Range Status    MRSA by PCR NEGATIVE NEGATIVE Final    Comment:        The GeneXpert MRSA Assay (FDA approved for NASAL specimens only), is one component of a comprehensive MRSA colonization surveillance program. It is not intended to diagnose MRSA infection nor to guide or monitor treatment for MRSA infections.    Medical History: Past Medical History  Diagnosis Date  . COPD (chronic obstructive pulmonary disease) ([place])   . ETOH abuse     stopped 10 years ago  . Depression     suicide attempt 20 years ago  . Arthritis   . Asthma   . Viral hepatitis C   . Diabetes mellitus without complication ([place])   . Hypothyroidism   . Cancer ([place])     esophageal  . Pneumonia 02/2015  . Itching     both feet, known has athlet's feet now  . Esophageal cancer ([place]) 04/03/2015  . Pulmonary nodules 07/09/2015  . On home O2    Assessment: 65 yo male found unresponsive and transported to the ED by EMS. Pt has complicated PMH including recent hospitalization for pneumonia. Pt has elevated WBCs and will be given empiric antibiotics for sepsis.  Goal of Therapy:  Vancomycin troughs 15-20 mcg/ml Eradicated infection  Plan:  Vancomycin 1500mg  total this am then 1000mg  IV q12h Check trough at steady state Zosyn 3.375gm IV  q8h, EID Monitor labs, renal fxn, progress and c/s Deescalate ABX when improved / appropriate.     Hart Robinsons, PharmD Clinical Pharmacist Pager:  [phone] 09/04/2015 8:02 AM

## 2015-08-24 NOTE — Progress Notes (Signed)
ANTIBIOTIC CONSULT NOTE-Preliminary  Pharmacy Consult for Vancomycin and Zosyn Indication: Sepsis  Allergies  Allergen Reactions  . Demerol [Meperidine] Rash    Patient Measurements: Weight: 162 lb (73.483 kg)  Vital Signs: Temp: 95.7 F (35.4 C) (06/16 0333) Temp Source: Rectal (06/16 0333) BP: 95/62 mmHg (06/16 0515) Pulse Rate: 102 (06/16 0515)  Labs:  Recent Labs  08/27/2015 0353  WBC 17.7*  HGB 10.5*  PLT 225  CREATININE 1.39*    Estimated Creatinine Clearance: 56.5 mL/min (by C-G formula based on Cr of 1.39).  No results for input(s): VANCOTROUGH, VANCOPEAK, VANCORANDOM, GENTTROUGH, GENTPEAK, GENTRANDOM, TOBRATROUGH, TOBRAPEAK, TOBRARND, AMIKACINPEAK, AMIKACINTROU, AMIKACIN in the last 72 hours.   Microbiology: Recent Results (from the past 720 hour(s))  Urine culture     Status: None   Collection Time: 08/09/15  3:00 PM  Result Value Ref Range Status   Specimen Description URINE, CATHETERIZED  Final   Special Requests NONE  Final   Culture NO GROWTH Performed at [facility]   Final   Report Status 08/11/2015 FINAL  Final  Culture, blood (routine x 2) Call MD if unable to obtain prior to antibiotics being given     Status: None   Collection Time: 08/09/15  5:10 PM  Result Value Ref Range Status   Specimen Description BLOOD RIGHT HAND  Final   Special Requests   Final    BOTTLES DRAWN AEROBIC AND ANAEROBIC AEB=6CC ANA=5CC   Culture NO GROWTH 5 DAYS  Final   Report Status 08/14/2015 FINAL  Final  Culture, blood (routine x 2) Call MD if unable to obtain prior to antibiotics being given     Status: None   Collection Time: 08/09/15  5:16 PM  Result Value Ref Range Status   Specimen Description BLOOD RIGHT HAND  Final   Special Requests BOTTLES DRAWN AEROBIC AND ANAEROBIC [place]  Final   Culture NO GROWTH 5 DAYS  Final   Report Status 08/14/2015 FINAL  Final  MRSA PCR Screening     Status: None   Collection Time: 08/09/15  6:05 PM  Result Value  Ref Range Status   MRSA by PCR NEGATIVE NEGATIVE Final    Comment:        The GeneXpert MRSA Assay (FDA approved for NASAL specimens only), is one component of a comprehensive MRSA colonization surveillance program. It is not intended to diagnose MRSA infection nor to guide or monitor treatment for MRSA infections.     Medical History: Past Medical History  Diagnosis Date  . COPD (chronic obstructive pulmonary disease) ([place])   . ETOH abuse     stopped 10 years ago  . Depression     suicide attempt 20 years ago  . Arthritis   . Asthma   . Viral hepatitis C   . Diabetes mellitus without complication ([place])   . Hypothyroidism   . Cancer ([place])     esophageal  . Pneumonia 02/2015  . Itching     both feet, known has athlet's feet now  . Esophageal cancer ([place]) 04/03/2015  . Pulmonary nodules 07/09/2015  . On home O2     Medications:   Assessment: 65 yo male found unresponsive and transported to the ED by EMS. Pt has complicated PMH including recent hospitalization for pneumonia. Pt has elevated WBCs and will be given empiric antibiotics for sepsis.  Goal of Therapy:  Vancomycin troughs 15-20 mcg/ml Eradicated infection  Plan:  Preliminary review of pertinent patient information completed.  Protocol will  be initiated with one-time doses of Vancomycin 1 Gm IV and Zosyn 3.375 Gm IV.  Forestine Na clinical pharmacist will complete review during morning rounds to assess patient and finalize treatment regimen.  Norberto Sorenson, [facility] 08/29/2015,5:27 AM

## 2015-08-24 NOTE — Progress Notes (Signed)
SLP Cancellation Note  Patient Details Name: Brent Franco MRN: [id number] DOB: 1950-09-05   Cancelled treatment:       Reason Eval/Treat Not Completed: Medical issues which prohibited therapy;Patient not medically ready. Pt continues to be very lethargic currently wearing a BiPAP and is not appropriate for any PO at this time. ST to re-attempt when pt is medically appropriate.  Faylinn Schwenn H. Roddie Mc, CCC-SLP Speech Language Pathologist    Wende Bushy 08/23/2015, 5:46 PM

## 2015-08-24 NOTE — ED Notes (Signed)
CRITICAL VALUE ALERT  Critical value received:  LA 5.5  Date of notification:  08/22/2015  Time of notification:  0408  Critical value read back:Yes.    Nurse who received alert:  Iona Coach  MD notified (1st page):  [place]

## 2015-08-24 NOTE — ED Notes (Signed)
Restraints d/c at this time. Pt is calm and not pulling at tubing.

## 2015-08-24 NOTE — H&P (Signed)
History and Physical    Brent Franco [id number] DOB: 1950/08/29 DOA: 08/15/2015  PCP: [name], NP  Patient coming from: Home  Chief Complaint: Lethargy  HPI: Brent Franco is a 66 y.o. male with medical history significant of metastatic esophageal cancer, hepatitis C, COPD, dysphagia with recurrent aspiration, has had multiple admissions in the past several months. He was recently discharged on 6/5 after being treated for aspiration pneumonia. He was scheduled to have outpatient modified barium swallow as well as PET scan for reevaluation of his cancer. Unfortunately, this has had to have been rescheduled at least twice since patient has never been well enough to do these tests as an outpatient. Family reports that he was doing well after his last discharge when yesterday they noted he was increasingly somnolent and had decreased by mouth intake. They had noticed some cough. They did not report any fever. It is unclear whether he had worsening shortness of breath. At one point during the night, family went to check on him and was noted to be unresponsive he was brought to the hospital for evaluation  ED Course: Patient was found to have elevated lactic acid as well as leukocytosis. Imaging of the chest including CT scan indicated pneumonia. He was noted to be hypothermic, tachycardic and was admitted to the stepdown for further evaluation. He received a total of 3 L of saline bolus in the emergency room  Review of Systems: As per HPI otherwise 10 point review of systems negative.    Past Medical History  Diagnosis Date  . COPD (chronic obstructive pulmonary disease) ([place])   . ETOH abuse     stopped 10 years ago  . Depression     suicide attempt 20 years ago  . Arthritis   . Asthma   . Viral hepatitis C   . Diabetes mellitus without complication ([place])   . Hypothyroidism   . Cancer ([place])     esophageal  . Pneumonia 02/2015  . Itching     both feet, known has athlet's feet now    . Esophageal cancer ([place]) 04/03/2015  . Pulmonary nodules 07/09/2015  . On home O2     Past Surgical History  Procedure Laterality Date  . Other surgical history Left 1995    fatty tissue tumor right upper thigh  . Dental surgery N/A January 2016    "dentures and spurs"   . Tumor excision Left 1990    inner thigh  . Colonoscopy with propofol N/A 03/26/2015    RMR: colonic diverticulosis redundatn t colon. single colonic polyp removed as described above.   . Esophagogastroduodenoscopy (egd) with propofol N/A 03/26/2015    RMR: Bulky esophageal tumo status post biopsy 2cm hiatal hernia  . Biopsy  03/26/2015    Procedure: BIOPSY;  Surgeon: Daneil Dolin, MD;  [facility]: [facility];  Service: Endoscopy;;  esophageal mass  . Polypectomy  03/26/2015    Procedure: POLYPECTOMY;  Surgeon: Daneil Dolin, MD;  Location: [facility];  Service: Endoscopy;;  polypectomy at ileocecal valve     reports that he quit smoking about 20 months ago. His smoking use included Cigarettes. He started smoking about 48 years ago. He has a 70.5 pack-year smoking history. He has never used smokeless tobacco. He reports that he does not drink alcohol or use illicit drugs.  Allergies  Allergen Reactions  . Demerol [Meperidine] Rash    Family History  Problem Relation Age of Onset  . Pancreatic cancer Mother   .  Lung cancer Father   . Colon cancer Neg Hx      Prior to Admission medications   Medication Sig Start Date End Date Taking? Authorizing Provider  ACCU-CHEK AVIVA PLUS test strip 2 (two) times daily. use for testing 05/02/15   Historical Provider, MD  acetaminophen (TYLENOL) 500 MG tablet Take 500 mg by mouth every 6 (six) hours as needed for fever.    Historical Provider, MD  albuterol (PROVENTIL HFA;VENTOLIN HFA) 108 (90 BASE) MCG/ACT inhaler Inhale 1-2 puffs into the lungs every 6 (six) hours as needed for wheezing or shortness of breath.     Historical Provider, MD  ALPRAZolam Duanne Moron) 1 MG  tablet Take 1 tablet (1 mg total) by mouth every 8 (eight) hours as needed for anxiety. 07/24/15   Patrici Ranks, MD  amoxicillin-clavulanate (AUGMENTIN) 875-125 MG tablet Take 1 tablet by mouth 2 (two) times daily. 08/13/15   Kathie Dike, MD  docusate sodium (COLACE) 100 MG capsule Take 100 mg by mouth 2 (two) times daily. 03/10/15   Historical Provider, MD  FLUoxetine (PROZAC) 20 MG capsule Take 1 capsule (20 mg total) by mouth daily. 06/11/15   Baird Cancer, PA-C  guaiFENesin (MUCINEX) 600 MG 12 hr tablet Take 1 tablet (600 mg total) by mouth 2 (two) times daily. 08/13/15   Kathie Dike, MD  HUMULIN R 100 UNIT/ML injection Inject 2-12 Units into the skin 2 (two) times daily as needed for high blood sugar. 151-200 = 2 units, 201-250 = 4 units, 251-300 = 6 units, 301-350 = 8 units, 351-400 = 10 units, 401-450 = 12 units. 03/13/15   Historical Provider, MD  ipratropium-albuterol (DUONEB) 0.5-2.5 (3) MG/3ML SOLN INHALE CONTENTS OF 1 VIAL IN NEBULIZER EVERY 4 HOURS AS NEEDED FOR SHORTNESS OF BREATH. 03/10/15   Historical Provider, MD  Ledipasvir-Sofosbuvir (HARVONI) 90-400 MG TABS Take 1 tablet by mouth daily.     Historical Provider, MD  levothyroxine (SYNTHROID, LEVOTHROID) 25 MCG tablet Take 25 mcg by mouth daily. 06/27/15   Historical Provider, MD  lidocaine (XYLOCAINE) 2 % solution Reported on 07/24/2015 05/22/15   Historical Provider, MD  lidocaine-prilocaine (EMLA) cream Apply a quarter size amount to port site 1 hour prior to chemo. Do not rub in. Cover with plastic wrap. 03/30/15   Patrici Ranks, MD  meloxicam (MOBIC) 7.5 MG tablet TAKE ONE TABLET BY MOUTH ONCE DAILY 05/01/15   Historical Provider, MD  metFORMIN (GLUCOPHAGE) 500 MG tablet Take 500 mg by mouth 2 (two) times daily. 03/10/15   Historical Provider, MD  nitroGLYCERIN (NITROSTAT) 0.4 MG SL tablet Place 1 tablet (0.4 mg total) under the tongue every 5 (five) minutes as needed for chest pain. 09/21/14   Herminio Commons, MD   ondansetron (ZOFRAN) 8 MG tablet TAKE 1 TABLET (8 MG TOTAL) BY MOUTH EVERY 8 (EIGHT) HOURS AS NEEDED FOR NAUSEA OR VOMITING. 07/02/15   Patrici Ranks, MD  oxyCODONE (OXYCONTIN) 20 mg 12 hr tablet Take 1 tablet (20 mg total) by mouth every 8 (eight) hours. 08/21/15   Baird Cancer, PA-C  oxyCODONE-acetaminophen (PERCOCET) 10-325 MG tablet Take 1 tablet by mouth every 4 (four) hours as needed for pain. 08/21/15   Baird Cancer, PA-C  predniSONE (DELTASONE) 10 MG tablet Take 2 tablets (20 mg total) by mouth daily with breakfast. Take 40mg  po daily on day 1 and decrease by 10mg  daily until complete 08/13/15   Kathie Dike, MD  prochlorperazine (COMPAZINE) 10 MG tablet TAKE 1 TABLET (10  MG TOTAL) BY MOUTH EVERY 6 (SIX) HOURS AS NEEDED FOR NAUSEA OR VOMITING. 07/02/15   Patrici Ranks, MD  SPIRIVA HANDIHALER 18 MCG inhalation capsule Place 1 puff into inhaler and inhale daily. 12/04/14   Historical Provider, MD  sucralfate (CARAFATE) 1 g tablet Take 1 tablet by mouth 2 (two) times daily as needed (throat pain).  05/10/15   Historical Provider, MD  tetrahydrozoline 0.05 % ophthalmic solution Place 1 drop into both eyes daily as needed (dry eyes).    Historical Provider, MD    Physical Exam: Filed Vitals:   08/26/2015 1000 08/16/2015 1030 08/30/2015 1100 09/04/2015 1130  BP: 130/81 116/74 125/80 116/102  Pulse: 114 110 109 110  Temp:      TempSrc:      Resp: 21 24 23 18   Height:      Weight:      SpO2: 100% 100% 100% 100%      Constitutional: Lying in bed, has increased respiratory effort Filed Vitals:   08/22/2015 1000 08/15/2015 1030 08/25/2015 1100 08/14/2015 1130  BP: 130/81 116/74 125/80 116/102  Pulse: 114 110 109 110  Temp:      TempSrc:      Resp: 21 24 23 18   Height:      Weight:      SpO2: 100% 100% 100% 100%   Eyes: PERRL, lids and conjunctivae normal ENMT: Mucous membranes are moist. Posterior pharynx clear of any exudate or lesions.Normal dentition.  Neck: normal, supple, no  masses, no thyromegaly Respiratory: Bilateral rhonchi. Increased respiratory effort..  Cardiovascular: Regular rate and rhythm, no murmurs / rubs / gallops. No extremity edema. 2+ pedal pulses. No carotid bruits.  Abdomen: no tenderness, no masses palpated. No hepatosplenomegaly. Bowel sounds positive.  Musculoskeletal: no clubbing / cyanosis. No joint deformity upper and lower extremities. Good ROM, no contractures. Normal muscle tone.  Skin: no rashes, lesions, ulcers. No induration Neurologic: Confused, moving all extremities spontaneously Psychiatric: Confused   Labs on Admission: I have personally reviewed following labs and imaging studies  CBC:  Recent Labs Lab 08/12/2015 0353  WBC 17.7*  NEUTROABS 16.1*  HGB 10.5*  HCT 33.7*  MCV 99.4  PLT 123456   Basic Metabolic Panel:  Recent Labs Lab 08/14/2015 0353  NA 138  K 4.7  CL 101  CO2 25  GLUCOSE 181*  BUN 26*  CREATININE 1.39*  CALCIUM 8.4*   GFR: Estimated Creatinine Clearance: 55.6 mL/min (by C-G formula based on Cr of 1.39). Liver Function Tests:  Recent Labs Lab 08/12/2015 0353  AST 69*  ALT 62  ALKPHOS 284*  BILITOT 1.1  PROT 7.2  ALBUMIN 2.6*   No results for input(s): LIPASE, AMYLASE in the last 168 hours.  Recent Labs Lab 08/27/2015 0353  AMMONIA 49*   Coagulation Profile: No results for input(s): INR, PROTIME in the last 168 hours. Cardiac Enzymes:  Recent Labs Lab 08/15/2015 0353  TROPONINI 0.04*   BNP (last 3 results) No results for input(s): PROBNP in the last 8760 hours. HbA1C: No results for input(s): HGBA1C in the last 72 hours. CBG: No results for input(s): GLUCAP in the last 168 hours. Lipid Profile: No results for input(s): CHOL, HDL, LDLCALC, TRIG, CHOLHDL, LDLDIRECT in the last 72 hours. Thyroid Function Tests: No results for input(s): TSH, T4TOTAL, FREET4, T3FREE, THYROIDAB in the last 72 hours. Anemia Panel: No results for input(s): VITAMINB12, FOLATE, FERRITIN, TIBC, IRON,  RETICCTPCT in the last 72 hours. Urine analysis:    Component Value Date/Time  COLORURINE YELLOW 08/16/2015 0511   APPEARANCEUR HAZY* 08/19/2015 0511   LABSPEC >1.030* 08/21/2015 0511   PHURINE 5.5 08/22/2015 0511   GLUCOSEU NEGATIVE 08/15/2015 0511   HGBUR NEGATIVE 09/07/2015 0511   BILIRUBINUR NEGATIVE 08/25/2015 0511   KETONESUR NEGATIVE 08/25/2015 0511   PROTEINUR 30* 08/25/2015 0511   UROBILINOGEN 0.2 08/23/2014 1321   NITRITE NEGATIVE 08/10/2015 0511   LEUKOCYTESUR NEGATIVE 08/21/2015 0511   Sepsis Labs: !!!!!!!!!!!!!!!!!!!!!!!!!!!!!!!!!!!!!!!!!!!! @LABRCNTIP (procalcitonin:4,lacticidven:4) )No results found for this or any previous visit (from the past 240 hour(s)).   Radiological Exams on Admission: Ct Head Wo Contrast  08/20/2015  CLINICAL DATA:  Initial evaluation for acute altered mental status. EXAM: CT HEAD WITHOUT CONTRAST TECHNIQUE: Contiguous axial images were obtained from the base of the skull through the vertex without intravenous contrast. COMPARISON:  Prior CT from 08/09/2015. FINDINGS: There is no acute intracranial hemorrhage or infarct. No mass lesion or midline shift. Gray-white matter differentiation is well maintained. Ventricles are normal in size without evidence of hydrocephalus. CSF containing spaces are within normal limits. No extra-axial fluid collection. Age-appropriate cerebral atrophy. The calvarium is intact. Orbital soft tissues are within normal limits. The paranasal sinuses and mastoid air cells are well pneumatized and free of fluid. Scalp soft tissues are unremarkable. IMPRESSION: Negative head CT.  No acute intracranial process identified. Electronically Signed   By: Jeannine Boga M.D.   On: 08/29/2015 05:09   Ct Angio Chest Pe W/cm &/or Wo Cm  08/11/2015  CLINICAL DATA:  Patient was found unresponsive. Pinpoint pupils. Patient cool to touch. EXAM: CT ANGIOGRAPHY CHEST WITH CONTRAST TECHNIQUE: Multidetector CT imaging of the chest was  performed using the standard protocol during bolus administration of intravenous contrast. Multiplanar CT image reconstructions and MIPs were obtained to evaluate the vascular anatomy. CONTRAST:  100 mL Isovue-[id number] COMPARISON:  08/10/2015 FINDINGS: Technically adequate study with good opacification of the central and segmental pulmonary arteries. No focal filling defects. No evidence of significant pulmonary embolus. Normal heart size. Normal caliber thoracic aorta. No aortic dissection. Calcification of the aorta. Esophagus is decompressed. No significant lymphadenopathy in the chest. Evaluation of lungs is limited due to respiratory motion artifact. There are patchy nodular ground-glass infiltrates scattered throughout both lungs, more prominent in the bases. Peribronchovascular nodules in the bases. Changes are consistent with bilateral bronchopneumonia. Superimposed edema may also be present. Medial consolidation in both lungs posteriorly. This could be compatible with radiation change if the patient has such a history. Minimal left pleural effusion. No pneumothorax. Included portions of the upper abdominal organs demonstrate diffuse fatty infiltration of the liver. Degenerative changes in the spine. No destructive bone lesions. Review of the MIP images confirms the above findings. IMPRESSION: No evidence of significant pulmonary embolus. Patchy nodular ground-glass infiltrates with peribronchial nodules consistent with bronchopneumonia. Possible superimposed interstitial edema. Small left pleural effusion. Posterior medial lung consolidation in a pattern suggesting radiation change. Electronically Signed   By: Lucienne Capers M.D.   On: 09/05/2015 05:14   Dg Chest Port 1 View  09/07/2015  CLINICAL DATA:  Acute onset of altered mental status, generalized weakness and shortness of breath. Initial encounter. EXAM: PORTABLE CHEST 1 VIEW COMPARISON:  Chest radiograph performed 08/09/2015, and CT of the chest  performed 08/10/2015 FINDINGS: The lungs are well-aerated. Vascular congestion is noted. Mild bibasilar opacities raise concern for mild interstitial edema. There is no evidence of pleural effusion or pneumothorax. The cardiomediastinal silhouette is within normal limits. No acute osseous abnormalities are seen. A right-sided chest port is  noted ending about the cavoatrial junction. IMPRESSION: Vascular congestion noted. Mild bibasilar opacities raise concern for mild interstitial edema. Electronically Signed   By: Garald Balding M.D.   On: 08/12/2015 04:01    EKG: Independently reviewed. Sinus tachycardia  Assessment/Plan Active Problems:   COPD (chronic obstructive pulmonary disease) (HCC)   Dysphagia   Diabetes mellitus without complication, with long-term current use of insulin (HCC)   Aspiration pneumonia ([facility])   Acute on chronic respiratory failure with hypoxia and hypercapnia ([facility])   Hypothyroidism   Hepatitis C, chronic ([facility]CC)   Sepsis (HCC)   Encephalopathy  1. Sepsis. Likely related to pneumonia. Blood cultures have been sent. He was started on intravenous antibiotics. Lactic acid has trended down with hydration. Continue to monitor.  2. Aspiration pneumonia. This appears to be a recurrent issue since patient has underlying dysphagia. He's been started on intravenous Unasyn. Continue pulmonary hygiene  3. Acute on chronic respiratory failure with hypoxia. Patient is currently on a nonrebreather. We'll try and wean down oxygen as tolerated.  4. Acute encephalopathy. Likely related to sepsis. PCO2 was mildly elevated on blood gas, this will be repeated. Continue with antibiotics and IV fluids. We'll continue to monitor.   5. Diabetes. Start sliding scale insulin. Hold oral agents for now  6. Hepatitis C, patient is on treatment with Harvoni  7. Dysphagia. We'll consult speech therapy. Currently he is on a dysphagia 2 diet with thin liquids. Ultimately he needs a modified barium  swallow to discontinue an outpatient.  8. COPD. Continue bronchodilators and pulmonary hygiene. Will start intravenous steroids.     DVT prophylaxis: lovenox Code Status: full code Family Communication: discussed with wife over the phone Disposition Plan: pending hospital course Consults called: oncology, palliative care Admission status: inpatient, step down   Kurk Corniel MD [facility] Pager [phone]  If 7PM-7AM, please contact night-coverage [email or web address] Password [facility]  08/23/2015, 11:54 AM

## 2015-08-24 NOTE — ED Notes (Signed)
Pt arrived to er by [facility] with after being found unresponsive by spouse, on ems arrival pt resp rate was 8, pulse ox mid 80's, pt cool to touch, pupils pinpoint, pt given 2 mg narcan iv by ems with improvement in resp status, on arrival to er, pt will respond to painful stimuli, Dr Leonides Schanz at bedside,

## 2015-08-24 NOTE — ED Notes (Signed)
Pt is alert to verbal stimuli.

## 2015-08-24 NOTE — Progress Notes (Signed)
Was asked by RN to evaluate pt for agitation and pulling at tubes and lines. He has been encephalopathic, suspected secondary to sepsis. He had already been treated with Ativan 0.5 mg IV ~3 hrs ago, and Haldol 5mg  IV ~2 hrs ago, and per RN there has been no appreciable improvement. Pt reports pain, but unable to localize. Will treat pain with 1 mg IV morphine. He would benefit from a sitter for frequent reorientation and to prevent accidental injury to self. RN notified me that there are no sitters available.

## 2015-08-24 NOTE — ED Notes (Signed)
MD Ward notified of BP at this time. Pt's family brought to bedside as per MD request.

## 2015-08-24 NOTE — ED Provider Notes (Addendum)
TIME SEEN: 3:15 AM  CHIEF COMPLAINT: Altered mental status  HPI: Pt is a 65 y.o. male with history of previous alcohol abuse, COPD on home oxygen, hepatitis C, diabetes, esophageal cancer with possible metastasis (s/p chemo and radiation, finished app 2 months ago) who presents to the emergency department with altered metal status. EMS reports the family stated they found the patient altered around 1:30 AM. Patient's daughter Luetta Nutting reports that at 8 PM she gave him his pain medication. She states that they keep his pain medication locked up. No changes in the dose. States he walked himself to bed. She checked on him at midnight and he was talking, appeared comfortable. She went to check on him at 1:30 AM and he was unresponsive and they called EMS. Blood glucose was EMS was in the 200s. Patient was given 2 mg of Narcan without any improvement. Was recently admitted for sepsis, healthcare associated pneumonia. Family reports he has been off of antibiotics for approximately one week. They have not noticed any fever but states for the past 2 days he has been coughing more than normal. States that he has a difficult time swallowing and their concern for aspiration. Family reports over the past several days he has been weaker than normal, decreased oral intake.  His PCP is Dr. Maudie Mercury, oncologist Dr. Whitney Muse.    Patient's wife name is [name]. Her phone number [phone]. His daughter's name is Museum/gallery conservator. Her phone number is [phone].  ROS: Level V caveat for altered mental status  PAST MEDICAL HISTORY/PAST SURGICAL HISTORY:  Past Medical History  Diagnosis Date  . COPD (chronic obstructive pulmonary disease) ([place])   . ETOH abuse     stopped 10 years ago  . Depression     suicide attempt 20 years ago  . Arthritis   . Asthma   . Viral hepatitis C   . Diabetes mellitus without complication ([place])   . Hypothyroidism   . Cancer ([place])     esophageal  . Pneumonia 02/2015  . Itching     both feet,  known has athlet's feet now  . Esophageal cancer ([place]) 04/03/2015  . Pulmonary nodules 07/09/2015  . On home O2     MEDICATIONS:  Prior to Admission medications   Medication Sig Start Date End Date Taking? Authorizing Provider  ACCU-CHEK AVIVA PLUS test strip 2 (two) times daily. use for testing 05/02/15   Historical Provider, MD  acetaminophen (TYLENOL) 500 MG tablet Take 500 mg by mouth every 6 (six) hours as needed for fever.    Historical Provider, MD  albuterol (PROVENTIL HFA;VENTOLIN HFA) 108 (90 BASE) MCG/ACT inhaler Inhale 1-2 puffs into the lungs every 6 (six) hours as needed for wheezing or shortness of breath.     Historical Provider, MD  ALPRAZolam Duanne Moron) 1 MG tablet Take 1 tablet (1 mg total) by mouth every 8 (eight) hours as needed for anxiety. 07/24/15   Patrici Ranks, MD  amoxicillin-clavulanate (AUGMENTIN) 875-125 MG tablet Take 1 tablet by mouth 2 (two) times daily. 08/13/15   Kathie Dike, MD  docusate sodium (COLACE) 100 MG capsule Take 100 mg by mouth 2 (two) times daily. 03/10/15   Historical Provider, MD  FLUoxetine (PROZAC) 20 MG capsule Take 1 capsule (20 mg total) by mouth daily. 06/11/15   Baird Cancer, PA-C  guaiFENesin (MUCINEX) 600 MG 12 hr tablet Take 1 tablet (600 mg total) by mouth 2 (two) times daily. 08/13/15   Kathie Dike, MD  HUMULIN R 100 UNIT/ML  injection Inject 2-12 Units into the skin 2 (two) times daily as needed for high blood sugar. 151-200 = 2 units, 201-250 = 4 units, 251-300 = 6 units, 301-350 = 8 units, 351-400 = 10 units, 401-450 = 12 units. 03/13/15   Historical Provider, MD  ipratropium-albuterol (DUONEB) 0.5-2.5 (3) MG/3ML SOLN INHALE CONTENTS OF 1 VIAL IN NEBULIZER EVERY 4 HOURS AS NEEDED FOR SHORTNESS OF BREATH. 03/10/15   Historical Provider, MD  Ledipasvir-Sofosbuvir (HARVONI) 90-400 MG TABS Take 1 tablet by mouth daily.     Historical Provider, MD  levothyroxine (SYNTHROID, LEVOTHROID) 25 MCG tablet Take 25 mcg by mouth daily. 06/27/15    Historical Provider, MD  lidocaine (XYLOCAINE) 2 % solution Reported on 07/24/2015 05/22/15   Historical Provider, MD  lidocaine-prilocaine (EMLA) cream Apply a quarter size amount to port site 1 hour prior to chemo. Do not rub in. Cover with plastic wrap. 03/30/15   Patrici Ranks, MD  meloxicam (MOBIC) 7.5 MG tablet TAKE ONE TABLET BY MOUTH ONCE DAILY 05/01/15   Historical Provider, MD  metFORMIN (GLUCOPHAGE) 500 MG tablet Take 500 mg by mouth 2 (two) times daily. 03/10/15   Historical Provider, MD  nitroGLYCERIN (NITROSTAT) 0.4 MG SL tablet Place 1 tablet (0.4 mg total) under the tongue every 5 (five) minutes as needed for chest pain. 09/21/14   Herminio Commons, MD  ondansetron (ZOFRAN) 8 MG tablet TAKE 1 TABLET (8 MG TOTAL) BY MOUTH EVERY 8 (EIGHT) HOURS AS NEEDED FOR NAUSEA OR VOMITING. 07/02/15   Patrici Ranks, MD  oxyCODONE (OXYCONTIN) 20 mg 12 hr tablet Take 1 tablet (20 mg total) by mouth every 8 (eight) hours. 08/21/15   Baird Cancer, PA-C  oxyCODONE-acetaminophen (PERCOCET) 10-325 MG tablet Take 1 tablet by mouth every 4 (four) hours as needed for pain. 08/21/15   Baird Cancer, PA-C  predniSONE (DELTASONE) 10 MG tablet Take 2 tablets (20 mg total) by mouth daily with breakfast. Take 40mg  po daily on day 1 and decrease by 10mg  daily until complete 08/13/15   Kathie Dike, MD  prochlorperazine (COMPAZINE) 10 MG tablet TAKE 1 TABLET (10 MG TOTAL) BY MOUTH EVERY 6 (SIX) HOURS AS NEEDED FOR NAUSEA OR VOMITING. 07/02/15   Patrici Ranks, MD  SPIRIVA HANDIHALER 18 MCG inhalation capsule Place 1 puff into inhaler and inhale daily. 12/04/14   Historical Provider, MD  sucralfate (CARAFATE) 1 g tablet Take 1 tablet by mouth 2 (two) times daily as needed (throat pain).  05/10/15   Historical Provider, MD  tetrahydrozoline 0.05 % ophthalmic solution Place 1 drop into both eyes daily as needed (dry eyes).    Historical Provider, MD    ALLERGIES:  Allergies  Allergen Reactions  . Demerol  [Meperidine] Rash    SOCIAL HISTORY:  Social History  Substance Use Topics  . Smoking status: Former Smoker -- 1.50 packs/day for 47 years    Types: Cigarettes    Start date: 12/03/1966    Quit date: 12/08/2013  . Smokeless tobacco: Never Used  . Alcohol Use: No     Comment: Quit 2006: previously drank heavily. Had 1 drink 2-3 years ago (2013-2014)    FAMILY HISTORY: Family History  Problem Relation Age of Onset  . Pancreatic cancer Mother   . Lung cancer Father   . Colon cancer Neg Hx     EXAM: BP 114/69 mmHg  Pulse 102  Temp(Src) 95.7 F (35.4 C) (Rectal)  Resp 34  Wt 162 lb (73.483 kg)  SpO2  100% CONSTITUTIONAL: Chronically ill-appearing, hypothermic, patient will open his eyes, move all extremities but does not answer questions or follow commands, somnolent but then became agitated HEAD: Normocephalic EYES: Conjunctivae clear, PERRL ENT: normal nose; no rhinorrhea; dry mucous membranes NECK: Supple, no meningismus, no LAD  CARD: Regular and tachycardic; S1 and S2 appreciated; no murmurs, no clicks, no rubs, no gallops RESP: Normal chest excursion without splinting; breath sounds equal bilaterally but progress, no wheezing or rales, no hypoxia on nonrebreather, tachypneic ABD/GI: Normal bowel sounds; non-distended; soft, non-tender, no rebound, no guarding, no peritoneal signs BACK:  The back appears normal, there is no CVA tenderness EXT: Normal ROM in all joints; no edema; normal capillary refill; no cyanosis, no calf tenderness or swelling    SKIN: Normal color for age and race; hands and feet are cool to touch but he has equal pulses in his bilateral upper and lower extremities; no rash NEURO: Moves all extremities equally, opens eyes spontaneously, does not follow commands or answer questions   MEDICAL DECISION MAKING: Patient here with altered mental status. Has multiple reasons he can be altered including possible sepsis, hepatic encephalopathy, metastasis to  the brain, medication related as he is on narcotics, hypercarbia. We'll obtain labs, cultures, chest x-ray, head CT, urine, ammonia, lactate, ABG. We'll give duo neb, IV fluids, broad-spectrum antibiotics. Patient is mildly hypothermic. We'll place him under a Retail banker.  ED PROGRESS: 4:20 AM  Patient's lactate is 5.5. I have ordered him 4 L of IV fluids and broad-spectrum antibiotics. No document of history of CHF. Chest x-ray shows bibasilar opacities concerning for possible edema but I feel that this may be infection. He has a leukocytosis of 17,000 with left shift that is worsening compared to prior. ABG shows mild hypercarbia with a PCO2 of 51.4, pH of 7.301.  Patient becoming more agitated. Will need Ativan prior to head CT. Is in 4. restraints as he is thrashing, kicking. Is able to answer "no" when asked if he is having pain. Unable to answer any other questions.     4:40 AM  Pt has mild elevation of his creatinine. He is getting aggressively hydrated. CT of his head pending. We'll also obtain a CT of his chest given he has history of cancer and has hypoxic, altered.   5:40 AM  Pt's head CT shows no acute abnormality. CT of the chest shows bronchopneumonia. No pulmonary embolus. Urine shows many bacteria but no other sign of infection. Urine drug screen positive for opiates and benzodiazepines which he is prescribed. Had lengthy discussion with patient's wife and 2 daughters at bedside. They state that he was scheduled to have a swallow study and PET scan done today as an outpatient to evaluate the status of his cancer and decided he needed a feeding tube as they feel that aspiration is likely the cause of his recurrent pneumonia. They are very reasonable family. At this time they have decided to keep the patient a full code. Patient is currently resting, on nonrebreather. No respiratory distress, hypoxia. He had to receive Ativan earlier for agitation which they report happens frequently to him  while he is in the hospital.    6:00 AM  Discussed patient's case with hospitalist, Dr. Myna Hidalgo.  Recommend admission to inpatient, stepdown bed.  I will place holding orders per their request. Patient and family (if present) updated with plan. Care transferred to hospitalist service.  I reviewed all nursing notes, vitals, pertinent old records, EKGs, labs, imaging (as available).  Sepsis - Repeat Assessment  Performed at:    5:47 AM  Vitals     Blood pressure 95/62, pulse 102, temperature 95.7 F (35.4 C), temperature source Rectal, resp. rate 25, weight 162 lb (73.483 kg), SpO2 100 %.  Heart:     Tachycardic  Lungs:    Rhonchi  Capillary Refill:   <2 sec  Peripheral Pulse:   Radial pulse palpable  Skin:     Normal Color      Date: 08/12/2015 3:26 AM  Rate: 101  Rhythm: Sinus tachycardia  QRS Axis: normal  Intervals: normal  ST/T Wave abnormalities: normal  Conduction Disutrbances: none  Narrative Interpretation: Sinus tachycardia       CRITICAL CARE Performed by: Nyra Jabs   Total critical care time: 60 minutes  Critical care time was exclusive of separately billable procedures and treating other patients.  Critical care was necessary to treat or prevent imminent or life-threatening deterioration.  Critical care was time spent personally by me on the following activities: development of treatment plan with patient and/or surrogate as well as nursing, discussions with consultants, evaluation of patient's response to treatment, examination of patient, obtaining history from patient or surrogate, ordering and performing treatments and interventions, ordering and review of laboratory studies, ordering and review of radiographic studies, pulse oximetry and re-evaluation of patient's condition.    Cricket, DO 08/21/2015 [id number]    6:40 AM  Pt's repeat rectal temperature is 96.3. Repeat lactate is 1.7. Blood pressure 102/78. Heart rate 108. Patient still  resting comfortably.  Brent, DO 08/09/2015 [id number]

## 2015-08-24 NOTE — ED Notes (Signed)
MD Ward notified of I-stat lactic of 1.76 and rectal temp of 96.3.

## 2015-08-25 LAB — BLOOD GAS, ARTERIAL
Acid-base deficit: 1 mmol/L (ref 0.0–2.0)
BICARBONATE: 23.4 meq/L (ref 20.0–24.0)
DRAWN BY: 213101
Delivery systems: POSITIVE
FIO2: 40
O2 Saturation: 97.8 %
PCO2 ART: 45.8 mmHg — AB (ref 35.0–45.0)
PH ART: 7.34 — AB (ref 7.350–7.450)
PO2 ART: 99.1 mmHg (ref 80.0–100.0)
Patient temperature: 37
TCO2: 13 mmol/L (ref 0–100)

## 2015-08-25 LAB — CBC
HCT: 29.2 % — ABNORMAL LOW (ref 39.0–52.0)
HEMOGLOBIN: 9.2 g/dL — AB (ref 13.0–17.0)
MCH: 30.6 pg (ref 26.0–34.0)
MCHC: 31.5 g/dL (ref 30.0–36.0)
MCV: 97 fL (ref 78.0–100.0)
PLATELETS: 199 10*3/uL (ref 150–400)
RBC: 3.01 MIL/uL — AB (ref 4.22–5.81)
RDW: 14.9 % (ref 11.5–15.5)
WBC: 13.2 10*3/uL — AB (ref 4.0–10.5)

## 2015-08-25 LAB — BASIC METABOLIC PANEL
ANION GAP: 10 (ref 5–15)
BUN: 22 mg/dL — ABNORMAL HIGH (ref 6–20)
CHLORIDE: 111 mmol/L (ref 101–111)
CO2: 22 mmol/L (ref 22–32)
Calcium: 7.9 mg/dL — ABNORMAL LOW (ref 8.9–10.3)
Creatinine, Ser: 1.05 mg/dL (ref 0.61–1.24)
GFR calc Af Amer: >60 mL/min (ref >60)
GLUCOSE: 154 mg/dL — AB (ref 65–99)
POTASSIUM: 4.1 mmol/L (ref 3.5–5.1)
SODIUM: 143 mmol/L (ref 135–145)

## 2015-08-25 LAB — GLUCOSE, CAPILLARY
GLUCOSE-CAPILLARY: 118 mg/dL — AB (ref 65–99)
GLUCOSE-CAPILLARY: 157 mg/dL — AB (ref 65–99)
Glucose-Capillary: 166 mg/dL — ABNORMAL HIGH (ref 65–99)
Glucose-Capillary: 142 mg/dL — ABNORMAL HIGH (ref 65–99)

## 2015-08-25 LAB — URINE CULTURE: CULTURE: NO GROWTH

## 2015-08-25 MED ORDER — MORPHINE SULFATE 25 MG/ML IV SOLN
2.0000 mg/h | INTRAVENOUS | Status: DC
  Administered 2015-08-25: 2 mg/h via INTRAVENOUS
  Filled 2015-08-25: qty 10

## 2015-08-25 MED ORDER — MORPHINE SULFATE (PF) 2 MG/ML IV SOLN
2.0000 mg | INTRAVENOUS | Status: AC
  Administered 2015-08-25: 2 mg via INTRAVENOUS
  Filled 2015-08-25: qty 1

## 2015-08-25 NOTE — Progress Notes (Signed)
Pt spo2 dropped to 82-85%  NRB placed on with 15lpm HFNC to maintain spo2... Pt not to go back on BIPAP due to increase irritation and DNR status. Pt is resting comfortable and therapist will continue to monitor.

## 2015-08-25 NOTE — Progress Notes (Addendum)
PROGRESS NOTE    Brent Franco  [id number] DOB: Dec 11, 1950 DOA: 08/13/2015 PCP: [name], NP  Outpatient Specialists:  Gastroenterology; Daneil Dolin, MD    Brief Narrative:  40 yom who was recently discharged from [facility] after being treated for acute encephalopathy secondary to aspiration pneumonia. He presented to the hospital when family members noticed that he became increasingly somnolent and unresponsive. While in the ED, he was noted to have elevated lactic acid and leukocytosis. Imaging of the chest indicated pneumonia, and he was admitted to stepdown for further management.    Assessment & Plan:   Active Problems:   COPD (chronic obstructive pulmonary disease) ([place])   Dysphagia   Diabetes mellitus without complication, with long-term current use of insulin ([facility])   Aspiration pneumonia ([facility])   Acute on chronic respiratory failure with hypoxia and hypercapnia ([facility])   Hypothyroidism   Hepatitis C, chronic ([facility])   Sepsis ([facility])   Encephalopathy  1. Sepsis. Likely related to pneumonia. Blood cultures have been sent. He was started on intravenous antibiotics. Lactic acid has returned to normal limits with hydration. Continue to monitor. 2. Aspiration pneumonia. This appears to be a recurrent issue since patient has underlying dysphagia. Continue intravenous Unasyn. Continue pulmonary hygiene 3. Acute on chronic respiratory failure with hypoxia. Patient is currently on a Bipap. Overall blood gas looks better, but still has increased work of breathing. Discussed with patient's wife and after talking it over with her family, they have decided not to pursue intubation and agree to DNR. Will start the patient on morphine for respiratory distress.  4. Acute encephalopathy. Likely related to sepsis. Repeat PCO2 showed improvement. Continue with antibiotics and IV fluids.Continue to monitor.  5. Diabetes. Continue sliding scale insulin. Hold oral agents for now 6. Hepatitis C, patient  is on treatment with Harvoni 7. Dysphagia. Speech therapy has evaluated the patient, but patient is not ready medically ready for tx due to lethargy. Currently he is on a dysphagia 2 diet with thin liquids. Ultimately he needs a modified barium swallow as an outpatient. 8. COPD. Continue bronchodilators and pulmonary hygiene. Continue intravenous steroids.    DVT prophylaxis: Lovenox Code Status: DNR Family Communication: discussed with wife Disposition Plan: discharge home once improved   Consultants:   ST  Procedures:   BiPAP  Antimicrobials:   Unasyn 6/16 >>    Subjective: Patient is on bipap. He is confused. Says he is short of breath  Objective: Filed Vitals:   08/25/15 0357 08/25/15 0400 08/25/15 0500 08/25/15 0600  BP:  148/80 163/84 126/104  Pulse:  111 114 119  Temp:      TempSrc:      Resp:  23 21 29   Height:      Weight:      SpO2: 100% 99% 98% 91%    Intake/Output Summary (Last 24 hours) at 08/25/15 [id number] Last data filed at 08/25/15 0600  Gross per 24 hour  Intake 1951.67 ml  Output    950 ml  Net 1001.67 ml   Filed Weights   08/27/2015 0333 09/04/2015 0800 08/25/15 0356  Weight: 73.483 kg (162 lb) 72.3 kg (159 lb 6.3 oz) 75.8 kg (167 lb 1.7 oz)    Examination:  General exam: sitting up in bed. Appears to be in respiratory distress Respiratory system: bilateral rhonchi. Increased respiratory effort Cardiovascular system: S1 & S2 heard, RRR. No JVD, murmurs, rubs, gallops or clicks. No pedal edema. Gastrointestinal system: Abdomen is nondistended, soft and nontender. No organomegaly  or masses felt. Normal bowel sounds heard. Central nervous system: confused, disoriented. No focal neurological deficits. Extremities: Symmetric 5 x 5 power. Skin: No rashes, lesions or ulcers Psychiatry: confused.     Data Reviewed: I have personally reviewed following labs and imaging studies  CBC:  Recent Labs Lab 08/11/2015 0353 08/18/2015 1257 08/25/15 0420    WBC 17.7* 13.1* 13.2*  NEUTROABS 16.1*  --   --   HGB 10.5* 9.2* 9.2*  HCT 33.7* 29.4* 29.2*  MCV 99.4 99.3 97.0  PLT 225 182 [id number]   Basic Metabolic Panel:  Recent Labs Lab 08/15/2015 0353 08/22/2015 1257  NA 138  --   K 4.7  --   CL 101  --   CO2 25  --   GLUCOSE 181*  --   BUN 26*  --   CREATININE 1.39* 1.19  CALCIUM 8.4*  --    GFR: Estimated Creatinine Clearance: 68.1 mL/min (by C-G formula based on Cr of 1.19). Liver Function Tests:  Recent Labs Lab 08/13/2015 0353  AST 69*  ALT 62  ALKPHOS 284*  BILITOT 1.1  PROT 7.2  ALBUMIN 2.6*   No results for input(s): LIPASE, AMYLASE in the last 168 hours.  Recent Labs Lab 08/14/2015 0353  AMMONIA 49*   Coagulation Profile: No results for input(s): INR, PROTIME in the last 168 hours. Cardiac Enzymes:  Recent Labs Lab 08/12/2015 0353  TROPONINI 0.04*   BNP (last 3 results) No results for input(s): PROBNP in the last 8760 hours. HbA1C: No results for input(s): HGBA1C in the last 72 hours. CBG:  Recent Labs Lab 08/09/2015 1204 09/06/2015 1551 08/30/2015 2238  GLUCAP 112* 125* 158*   Lipid Profile: No results for input(s): CHOL, HDL, LDLCALC, TRIG, CHOLHDL, LDLDIRECT in the last 72 hours. Thyroid Function Tests: No results for input(s): TSH, T4TOTAL, FREET4, T3FREE, THYROIDAB in the last 72 hours. Anemia Panel: No results for input(s): VITAMINB12, FOLATE, FERRITIN, TIBC, IRON, RETICCTPCT in the last 72 hours. Urine analysis:    Component Value Date/Time   COLORURINE YELLOW 08/12/2015 0511   APPEARANCEUR HAZY* 09/05/2015 0511   LABSPEC >1.030* 08/16/2015 0511   PHURINE 5.5 08/09/2015 0511   GLUCOSEU NEGATIVE 08/17/2015 0511   HGBUR NEGATIVE 08/16/2015 0511   BILIRUBINUR NEGATIVE 08/22/2015 0511   KETONESUR NEGATIVE 08/26/2015 0511   PROTEINUR 30* 08/18/2015 0511   UROBILINOGEN 0.2 08/23/2014 1321   NITRITE NEGATIVE 08/19/2015 0511   LEUKOCYTESUR NEGATIVE 08/31/2015 0511   Sepsis  Labs: @LABRCNTIP (procalcitonin:4,lacticidven:4)  )No results found for this or any previous visit (from the past 240 hour(s)).       Radiology Studies: Ct Head Wo Contrast  08/29/2015  CLINICAL DATA:  Initial evaluation for acute altered mental status. EXAM: CT HEAD WITHOUT CONTRAST TECHNIQUE: Contiguous axial images were obtained from the base of the skull through the vertex without intravenous contrast. COMPARISON:  Prior CT from 08/09/2015. FINDINGS: There is no acute intracranial hemorrhage or infarct. No mass lesion or midline shift. Gray-white matter differentiation is well maintained. Ventricles are normal in size without evidence of hydrocephalus. CSF containing spaces are within normal limits. No extra-axial fluid collection. Age-appropriate cerebral atrophy. The calvarium is intact. Orbital soft tissues are within normal limits. The paranasal sinuses and mastoid air cells are well pneumatized and free of fluid. Scalp soft tissues are unremarkable. IMPRESSION: Negative head CT.  No acute intracranial process identified. Electronically Signed   By: Jeannine Boga M.D.   On: 08/10/2015 05:09   Ct Angio Chest Pe W/cm &/or  Wo Cm  08/16/2015  CLINICAL DATA:  Patient was found unresponsive. Pinpoint pupils. Patient cool to touch. EXAM: CT ANGIOGRAPHY CHEST WITH CONTRAST TECHNIQUE: Multidetector CT imaging of the chest was performed using the standard protocol during bolus administration of intravenous contrast. Multiplanar CT image reconstructions and MIPs were obtained to evaluate the vascular anatomy. CONTRAST:  100 mL [id number] COMPARISON:  08/10/2015 FINDINGS: Technically adequate study with good opacification of the central and segmental pulmonary arteries. No focal filling defects. No evidence of significant pulmonary embolus. Normal heart size. Normal caliber thoracic aorta. No aortic dissection. Calcification of the aorta. Esophagus is decompressed. No significant lymphadenopathy in  the chest. Evaluation of lungs is limited due to respiratory motion artifact. There are patchy nodular ground-glass infiltrates scattered throughout both lungs, more prominent in the bases. Peribronchovascular nodules in the bases. Changes are consistent with bilateral bronchopneumonia. Superimposed edema may also be present. Medial consolidation in both lungs posteriorly. This could be compatible with radiation change if the patient has such a history. Minimal left pleural effusion. No pneumothorax. Included portions of the upper abdominal organs demonstrate diffuse fatty infiltration of the liver. Degenerative changes in the spine. No destructive bone lesions. Review of the MIP images confirms the above findings. IMPRESSION: No evidence of significant pulmonary embolus. Patchy nodular ground-glass infiltrates with peribronchial nodules consistent with bronchopneumonia. Possible superimposed interstitial edema. Small left pleural effusion. Posterior medial lung consolidation in a pattern suggesting radiation change. Electronically Signed   By: Lucienne Capers M.D.   On: 08/10/2015 05:14   Dg Chest Port 1 View  08/23/2015  CLINICAL DATA:  Acute onset of altered mental status, generalized weakness and shortness of breath. Initial encounter. EXAM: PORTABLE CHEST 1 VIEW COMPARISON:  Chest radiograph performed 08/09/2015, and CT of the chest performed 08/10/2015 FINDINGS: The lungs are well-aerated. Vascular congestion is noted. Mild bibasilar opacities raise concern for mild interstitial edema. There is no evidence of pleural effusion or pneumothorax. The cardiomediastinal silhouette is within normal limits. No acute osseous abnormalities are seen. A right-sided chest port is noted ending about the cavoatrial junction. IMPRESSION: Vascular congestion noted. Mild bibasilar opacities raise concern for mild interstitial edema. Electronically Signed   By: Garald Balding M.D.   On: 09/02/2015 04:01         Scheduled Meds: . sodium chloride   Intravenous STAT  . ampicillin-sulbactam (UNASYN) IV  3 g Intravenous Q6H  . antiseptic oral rinse  7 mL Mouth Rinse q12n4p  . chlorhexidine  15 mL Mouth Rinse BID  . enoxaparin (LOVENOX) injection  40 mg Subcutaneous Q24H  . FLUoxetine  20 mg Oral Daily  . guaiFENesin  1,200 mg Oral BID  . insulin aspart  0-15 Units Subcutaneous TID WC  . insulin aspart  0-5 Units Subcutaneous QHS  . ipratropium-albuterol  3 mL Nebulization Q4H  . Ledipasvir-Sofosbuvir  1 tablet Oral Daily  . levothyroxine  12.5 mcg Intravenous QAC breakfast  . methylPREDNISolone (SOLU-MEDROL) injection  60 mg Intravenous Q12H   Continuous Infusions: . sodium chloride 100 mL/hr at 08/25/15 0600     LOS: 1 day    Time spent: 25 minutes  Kathie Dike, MD [facility] Pager [phone]  If 7PM-7AM, please contact night-coverage [email or web address] Password [facility] 08/25/2015, 6:32 AM

## 2015-08-26 DIAGNOSIS — E038 Other specified hypothyroidism: Secondary | ICD-10-CM

## 2015-08-26 LAB — GLUCOSE, CAPILLARY
GLUCOSE-CAPILLARY: 148 mg/dL — AB (ref 65–99)
GLUCOSE-CAPILLARY: 131 mg/dL — AB (ref 65–99)
GLUCOSE-CAPILLARY: 119 mg/dL — AB (ref 65–99)
Glucose-Capillary: 191 mg/dL — ABNORMAL HIGH (ref 65–99)

## 2015-08-26 MED ORDER — FUROSEMIDE 10 MG/ML IJ SOLN
20.0000 mg | Freq: Once | INTRAMUSCULAR | Status: AC
  Administered 2015-08-26: 20 mg via INTRAVENOUS
  Filled 2015-08-26: qty 2

## 2015-08-26 MED ORDER — SODIUM CHLORIDE 0.9 % IV SOLN
INTRAVENOUS | Status: DC
  Administered 2015-08-26: 02:00:00 via INTRAVENOUS

## 2015-08-26 NOTE — Progress Notes (Signed)
PROGRESS NOTE    Brent Franco  [id number] DOB: 28-Sep-1950 DOA: 08/10/2015 PCP: [name], NP  Outpatient Specialists:  Gastroenterology; Daneil Dolin, MD    Brief Narrative:  49 yom who was recently discharged from [facility] after being treated for acute encephalopathy secondary to aspiration pneumonia. He presented to the hospital when family members noticed that he became increasingly somnolent and unresponsive. While in the ED, he was noted to have elevated lactic acid and leukocytosis. Imaging of the chest indicated pneumonia, and he was admitted to stepdown for further management.    Assessment & Plan:   Active Problems:   COPD (chronic obstructive pulmonary disease) ([place])   Dysphagia   Diabetes mellitus without complication, with long-term current use of insulin (HCC)   Aspiration pneumonia (HCC)   Acute on chronic respiratory failure with hypoxia and hypercapnia (HCC)   Hypothyroidism   Hepatitis C, chronic (HCC)   Sepsis (HCC)   Encephalopathy  1. Sepsis. Likely related to pneumonia. Blood cultures in process. Continue on intravenous antibiotics. Lactic acid has returned to normal limits with hydration. Continue to monitor. 2. Aspiration pneumonia. This appears to be a recurrent issue since patient has underlying dysphagia. Continue intravenous Unasyn. Continue pulmonary hygiene 3. Acute on chronic respiratory failure with hypoxia. Patient is on high flow oxygen. Discussed with patient's wife and after talking it over with her family, they have decided not to pursue intubation and agree to DNR. Will start the patient on morphine for respiratory distress. BiPAP therapy discontinued due to patient's agitation and DNR status.  4. Acute encephalopathy. Likely related to sepsis. Continue with antibiotics. Continue to monitor.  5. Diabetes. Continue sliding scale insulin. Oral agents being held for now. 6. Hepatitis C, patient is on treatment with Harvoni 7. Dysphagia. Speech  therapy has evaluated the patient, but patient is not ready medically ready for tx due to lethargy. Currently he is on a dysphagia 2 diet with thin liquids. Ultimately he needs a modified barium swallow as an outpatient. 8. COPD. Continue bronchodilators and pulmonary hygiene. Continue intravenous steroids.    DVT prophylaxis: Lovenox Code Status: DNR Family Communication: discussed with wife Disposition Plan: pending hospital course. May discharge with hospice services if does not improve.   Consultants:   ST  Procedures:   BiPAP  Antimicrobials:   Unasyn 6/16 >>    Subjective: Patient was agitated overnight. He has significant secretions that he is having trouble clearing. He is somnolent at this time. Does not answer questions.  Objective: Filed Vitals:   08/26/15 0339 08/26/15 0400 08/26/15 0500 08/26/15 0600  BP:  135/79  132/75  Pulse:  117 113 112  Temp:  97.4 F (36.3 C)    TempSrc:  Axillary    Resp:  28 24 23   Height:      Weight:   75.8 kg (167 lb 1.7 oz)   SpO2: 95% 91% 97% 97%    Intake/Output Summary (Last 24 hours) at 08/26/15 0652 Last data filed at 08/26/15 [id number]  Gross per 24 hour  Intake 2005.83 ml  Output   1775 ml  Net 230.83 ml   Filed Weights   08/14/2015 0800 08/25/15 0356 08/26/15 0500  Weight: 72.3 kg (159 lb 6.3 oz) 75.8 kg (167 lb 1.7 oz) 75.8 kg (167 lb 1.7 oz)    Examination:  General exam: somnolent, resting comfortably Respiratory system: bilateral rhonchi. normal respiratory effort Cardiovascular system: S1 & S2 tachycardic. No JVD, murmurs, rubs, gallops or clicks. No pedal edema.  Gastrointestinal system: Abdomen is nondistended, soft and nontender. No organomegaly or masses felt. Normal bowel sounds heard. Central nervous system: confused, disoriented. No focal neurological deficits. Extremities: Symmetric 5 x 5 power. Skin: No rashes, lesions or ulcers Psychiatry: somnolent.     Data Reviewed: I have personally  reviewed following labs and imaging studies  CBC:  Recent Labs Lab 08/13/2015 0353 09/03/2015 1257 08/25/15 0420  WBC 17.7* 13.1* 13.2*  NEUTROABS 16.1*  --   --   HGB 10.5* 9.2* 9.2*  HCT 33.7* 29.4* 29.2*  MCV 99.4 99.3 97.0  PLT 225 182 [id number]   Basic Metabolic Panel:  Recent Labs Lab 09/01/2015 0353 08/18/2015 1257 08/25/15 0420  NA 138  --  143  K 4.7  --  4.1  CL 101  --  111  CO2 25  --  22  GLUCOSE 181*  --  154*  BUN 26*  --  22*  CREATININE 1.39* 1.19 1.05  CALCIUM 8.4*  --  7.9*   GFR: Estimated Creatinine Clearance: 77.2 mL/min (by C-G formula based on Cr of 1.05). Liver Function Tests:  Recent Labs Lab 08/18/2015 0353  AST 69*  ALT 62  ALKPHOS 284*  BILITOT 1.1  PROT 7.2  ALBUMIN 2.6*   No results for input(s): LIPASE, AMYLASE in the last 168 hours.  Recent Labs Lab 08/20/2015 0353  AMMONIA 49*   Coagulation Profile: No results for input(s): INR, PROTIME in the last 168 hours. Cardiac Enzymes:  Recent Labs Lab 08/20/2015 0353  TROPONINI 0.04*   BNP (last 3 results) No results for input(s): PROBNP in the last 8760 hours. HbA1C: No results for input(s): HGBA1C in the last 72 hours. CBG:  Recent Labs Lab 08/25/2015 2238 08/25/15 0803 08/25/15 1153 08/25/15 1650 08/25/15 2141  GLUCAP 158* 166* 118* 157* 142*   Lipid Profile: No results for input(s): CHOL, HDL, LDLCALC, TRIG, CHOLHDL, LDLDIRECT in the last 72 hours. Thyroid Function Tests: No results for input(s): TSH, T4TOTAL, FREET4, T3FREE, THYROIDAB in the last 72 hours. Anemia Panel: No results for input(s): VITAMINB12, FOLATE, FERRITIN, TIBC, IRON, RETICCTPCT in the last 72 hours. Urine analysis:    Component Value Date/Time   COLORURINE YELLOW 08/10/2015 0511   APPEARANCEUR HAZY* 08/30/2015 0511   LABSPEC >1.030* 08/23/2015 0511   PHURINE 5.5 08/29/2015 0511   GLUCOSEU NEGATIVE 08/12/2015 0511   HGBUR NEGATIVE 08/09/2015 0511   BILIRUBINUR NEGATIVE 08/17/2015 0511   KETONESUR  NEGATIVE 08/15/2015 0511   PROTEINUR 30* 08/21/2015 0511   UROBILINOGEN 0.2 08/23/2014 1321   NITRITE NEGATIVE 08/27/2015 0511   LEUKOCYTESUR NEGATIVE 08/15/2015 0511   Sepsis Labs: @LABRCNTIP (procalcitonin:4,lacticidven:4)  ) Recent Results (from the past 240 hour(s))  Blood culture (routine x 2)     Status: None (Preliminary result)   Collection Time: 08/27/2015  3:53 AM  Result Value Ref Range Status   Specimen Description BLOOD  Final   Special Requests NONE  Final   Culture NO GROWTH 1 DAY  Final   Report Status PENDING  Incomplete  Blood culture (routine x 2)     Status: None (Preliminary result)   Collection Time: 08/09/2015  4:05 AM  Result Value Ref Range Status   Specimen Description BLOOD  Final   Special Requests NONE  Final   Culture NO GROWTH 1 DAY  Final   Report Status PENDING  Incomplete  Urine culture     Status: None   Collection Time: 08/15/2015  5:14 AM  Result Value Ref Range Status  Specimen Description URINE, CLEAN CATCH  Final   Special Requests NONE  Final   Culture NO GROWTH Performed at [facility]   Final   Report Status 08/25/2015 FINAL  Final         Radiology Studies: No results found.      Scheduled Meds: . ampicillin-sulbactam (UNASYN) IV  3 g Intravenous Q6H  . antiseptic oral rinse  7 mL Mouth Rinse q12n4p  . chlorhexidine  15 mL Mouth Rinse BID  . enoxaparin (LOVENOX) injection  40 mg Subcutaneous Q24H  . FLUoxetine  20 mg Oral Daily  . guaiFENesin  1,200 mg Oral BID  . insulin aspart  0-15 Units Subcutaneous TID WC  . insulin aspart  0-5 Units Subcutaneous QHS  . ipratropium-albuterol  3 mL Nebulization Q4H  . Ledipasvir-Sofosbuvir  1 tablet Oral Daily  . levothyroxine  12.5 mcg Intravenous QAC breakfast  . methylPREDNISolone (SOLU-MEDROL) injection  60 mg Intravenous Q12H   Continuous Infusions: . sodium chloride 100 mL/hr at 08/25/15 1700  . sodium chloride 10 mL/hr at 08/26/15 0135  . morphine 2 mg/hr  (08/26/15 0600)     LOS: 2 days    Time spent: 25 minutes  Kathie Dike, MD [facility] Pager [phone]  If 7PM-7AM, please contact night-coverage [email or web address] Password [facility] 08/26/2015, 6:52 AM

## 2015-08-26 NOTE — Progress Notes (Signed)
Family declined CPT for patient at the 8 am and 12 treatment times.

## 2015-08-26 NOTE — Progress Notes (Signed)
Pt spo2 94-95% on 8lpm HFNC and partial non-rebreather. Pt BS continue to increase rhonchi and coarse crackles. Breathing treatment and CPT via bed given pt tol well. RT will continue to monitor.

## 2015-08-27 ENCOUNTER — Encounter (HOSPITAL_COMMUNITY): Payer: Self-pay | Admitting: Primary Care

## 2015-08-27 DIAGNOSIS — Z7189 Other specified counseling: Secondary | ICD-10-CM

## 2015-08-27 DIAGNOSIS — G9340 Encephalopathy, unspecified: Secondary | ICD-10-CM

## 2015-08-27 DIAGNOSIS — R1310 Dysphagia, unspecified: Secondary | ICD-10-CM

## 2015-08-27 DIAGNOSIS — E870 Hyperosmolality and hypernatremia: Secondary | ICD-10-CM | POA: Diagnosis present

## 2015-08-27 DIAGNOSIS — Z515 Encounter for palliative care: Secondary | ICD-10-CM | POA: Insufficient documentation

## 2015-08-27 LAB — GLUCOSE, CAPILLARY
GLUCOSE-CAPILLARY: 198 mg/dL — AB (ref 65–99)
GLUCOSE-CAPILLARY: 232 mg/dL — AB (ref 65–99)
GLUCOSE-CAPILLARY: 185 mg/dL — AB (ref 65–99)
Glucose-Capillary: 168 mg/dL — ABNORMAL HIGH (ref 65–99)

## 2015-08-27 LAB — BASIC METABOLIC PANEL
ANION GAP: 12 (ref 5–15)
BUN: 37 mg/dL — ABNORMAL HIGH (ref 6–20)
CALCIUM: 8.5 mg/dL — AB (ref 8.9–10.3)
CO2: 25 mmol/L (ref 22–32)
Chloride: 116 mmol/L — ABNORMAL HIGH (ref 101–111)
Creatinine, Ser: 1.14 mg/dL (ref 0.61–1.24)
GLUCOSE: 166 mg/dL — AB (ref 65–99)
Potassium: 4 mmol/L (ref 3.5–5.1)
SODIUM: 153 mmol/L — AB (ref 135–145)

## 2015-08-27 LAB — CBC
HCT: 31 % — ABNORMAL LOW (ref 39.0–52.0)
HEMOGLOBIN: 9.5 g/dL — AB (ref 13.0–17.0)
MCH: 30.4 pg (ref 26.0–34.0)
MCHC: 30.6 g/dL (ref 30.0–36.0)
MCV: 99 fL (ref 78.0–100.0)
Platelets: 176 10*3/uL (ref 150–400)
RBC: 3.13 MIL/uL — AB (ref 4.22–5.81)
RDW: 15.9 % — ABNORMAL HIGH (ref 11.5–15.5)
WBC: 15.3 10*3/uL — AB (ref 4.0–10.5)

## 2015-08-27 MED ORDER — ALPRAZOLAM 0.5 MG PO TABS
1.0000 mg | ORAL_TABLET | Freq: Three times a day (TID) | ORAL | Status: DC
  Administered 2015-08-27: 1 mg via ORAL
  Filled 2015-08-27: qty 2

## 2015-08-27 MED ORDER — DEXTROSE-NACL 5-0.45 % IV SOLN
INTRAVENOUS | Status: DC
  Administered 2015-08-27: 20:00:00 via INTRAVENOUS
  Administered 2015-08-27: 75 mL/h via INTRAVENOUS

## 2015-08-27 MED ORDER — HALOPERIDOL LACTATE 5 MG/ML IJ SOLN
1.0000 mg | INTRAMUSCULAR | Status: DC | PRN
  Administered 2015-08-27 – 2015-08-28 (×3): 4 mg via INTRAVENOUS
  Filled 2015-08-27 (×3): qty 1

## 2015-08-27 MED ORDER — MILK AND MOLASSES ENEMA
1.0000 | Freq: Once | RECTAL | Status: AC
  Administered 2015-08-27: 250 mL via RECTAL

## 2015-08-27 NOTE — Progress Notes (Signed)
Wife refused pt treatment and CPT... Pt resting and didn't want pt to be bothered.

## 2015-08-27 NOTE — Progress Notes (Signed)
PROGRESS NOTE    Brent Franco  [id number] DOB: [birth year] DOA: 08/22/2015 PCP: [name], NP  Outpatient Specialists:  Gastroenterology; Daneil Dolin, MD  Brief Narrative:  53 yom who was recently discharged from [facility] after being treated for acute encephalopathy secondary to aspiration pneumonia. He presented to the hospital when family members noticed that he became increasingly somnolent and unresponsive. While in the ED, he was noted to have elevated lactic acid and leukocytosis. Imaging of the chest indicated pneumonia, and he was admitted to stepdown for further management.    Assessment & Plan:   Active Problems:   COPD (chronic obstructive pulmonary disease) ([place])   Dysphagia   Diabetes mellitus without complication, with long-term current use of insulin (HCC)   Aspiration pneumonia (HCC)   Acute on chronic respiratory failure with hypoxia and hypercapnia (HCC)   Hypothyroidism   Hepatitis C, chronic (HCC)   Sepsis (HCC)   Encephalopathy  1. Sepsis. Likely related to pneumonia. Blood cultures with no growth in two days. Continue on intravenous antibiotics. Lactic acid has returned to normal limits with hydration. WBC remains elevated possibly secondary to steroid use. Continue to monitor. 2. Aspiration pneumonia. This appears to be a recurrent issue since patient has underlying dysphagia. Continue intravenous Unasyn. Continue pulmonary hygiene 3. Acute on chronic respiratory failure with hypoxia. Patient is on high flow oxygen. BiPAP therapy discontinued due to patient's agitation and DNR status. Discussed with patient's wife and after talking it over with her family, they have decided not to pursue intubation and agree to DNR. Patient has been started on morphine for respiratory distress.  4. Acute encephalopathy. Likely related to sepsis/hypoxia. Continue with antibiotics. Continue to monitor.  5. Diabetes. Continue sliding scale insulin. Continue to hold oral agents for  now.  6. Hepatitis C, patient is on treatment with Harvoni. 7. Dysphagia. Speech therapy has evaluated the patient, but patient is not ready medically ready for tx due to lethargy. Currently he is on a dysphagia 2 diet with thin liquids. Ultimately he needs a modified barium swallow as an outpatient. 8. COPD. Continue bronchodilators and pulmonary hygiene. Continue intravenous steroids. 9. Discussion. Patient has had recurrent admission for encephalopathy and pneumonia, which is likely due to aspiration in the setting of dysphagia. He has been on aggressive management for the last 4 days without any significant improvement. Family has elected DNR status and no Bipap. He continued to be short of breath and was started on morphine infusion for resp distress. Palliative care has been consulted. Anticipate transition to full comfort care in the next 24 hours if there is no significant improvement.    DVT prophylaxis: Lovenox Code Status: DNR Family Communication: discussed with wife Disposition Plan: pending hospital course. May discharge with hospice services if does not improve.   Consultants:   ST- Dysphagia 2 diet  Procedures:     Antimicrobials:   Unasyn 6/16 >>    Subjective: Wife reports that patient had "a rough night". He continues to have difficulty breathing and requires large amounts of oxygen. He reports discomfort in his lower abdomen and also adds that he hasn't had a bowel movement in several days.  Objective: Filed Vitals:   08/27/15 0200 08/27/15 0400 08/27/15 0425 08/27/15 0600  BP: 138/86 168/91  155/88  Pulse: 112 118  111  Temp:  98.6 F (37 C)    TempSrc:  Axillary    Resp: 21 26  23   Height:      Weight:  SpO2: 97% 96% 98% 92%    Intake/Output Summary (Last 24 hours) at 08/27/15 0648 Last data filed at 08/27/15 [id number]  Gross per 24 hour  Intake 440.17 ml  Output   1750 ml  Net -1309.83 ml   Filed Weights   08/15/2015 0800 08/25/15 0356 08/26/15  0500  Weight: 72.3 kg (159 lb 6.3 oz) 75.8 kg (167 lb 1.7 oz) 75.8 kg (167 lb 1.7 oz)    Examination:  General exam: awake, mild increased resp effort Respiratory system: bilateral rhonchi. increased respiratory effort Cardiovascular system: S1 & S2 tachycardic. No JVD, murmurs, rubs, gallops or clicks. No pedal edema. Gastrointestinal system: Abdomen is nondistended, soft, tender in lower abdomen. No organomegaly or masses felt. Normal bowel sounds heard. Central nervous system: confused, disoriented. No focal neurological deficits. Extremities: Symmetric 5 x 5 power. Skin: No rashes, lesions or ulcers Psychiatry: cannot assess due to mental status     Data Reviewed: I have personally reviewed following labs and imaging studies  CBC:  Recent Labs Lab 08/30/2015 0353 08/10/2015 1257 08/25/15 0420 08/27/15 0443  WBC 17.7* 13.1* 13.2* 15.3*  NEUTROABS 16.1*  --   --   --   HGB 10.5* 9.2* 9.2* 9.5*  HCT 33.7* 29.4* 29.2* 31.0*  MCV 99.4 99.3 97.0 99.0  PLT 225 182 199 0000000   Basic Metabolic Panel:  Recent Labs Lab 09/04/2015 0353 09/03/2015 1257 08/25/15 0420 08/27/15 0443  NA 138  --  143 153*  K 4.7  --  4.1 4.0  CL 101  --  111 116*  CO2 25  --  22 25  GLUCOSE 181*  --  154* 166*  BUN 26*  --  22* 37*  CREATININE 1.39* 1.19 1.05 1.14  CALCIUM 8.4*  --  7.9* 8.5*   GFR: Estimated Creatinine Clearance: 71.1 mL/min (by C-G formula based on Cr of 1.14). Liver Function Tests:  Recent Labs Lab 08/27/2015 0353  AST 69*  ALT 62  ALKPHOS 284*  BILITOT 1.1  PROT 7.2  ALBUMIN 2.6*   No results for input(s): LIPASE, AMYLASE in the last 168 hours.  Recent Labs Lab 08/30/2015 0353  AMMONIA 49*   Coagulation Profile: No results for input(s): INR, PROTIME in the last 168 hours. Cardiac Enzymes:  Recent Labs Lab 09/06/2015 0353  TROPONINI 0.04*   CBG:  Recent Labs Lab 08/25/15 2141 08/26/15 0733 08/26/15 1122 08/26/15 1634 08/26/15 2121  GLUCAP 142* 191* 148*  131* 119*   Urine analysis:    Component Value Date/Time   COLORURINE YELLOW 09/06/2015 0511   APPEARANCEUR HAZY* 08/13/2015 0511   LABSPEC >1.030* 08/20/2015 0511   PHURINE 5.5 08/13/2015 0511   GLUCOSEU NEGATIVE 08/18/2015 0511   HGBUR NEGATIVE 08/14/2015 0511   BILIRUBINUR NEGATIVE 08/26/2015 0511   KETONESUR NEGATIVE 08/10/2015 0511   PROTEINUR 30* 08/19/2015 0511   UROBILINOGEN 0.2 08/23/2014 1321   NITRITE NEGATIVE 08/19/2015 0511   LEUKOCYTESUR NEGATIVE 08/29/2015 0511   Sepsis Labs: @LABRCNTIP (procalcitonin:4,lacticidven:4)  ) Recent Results (from the past 240 hour(s))  Blood culture (routine x 2)     Status: None (Preliminary result)   Collection Time: 08/22/2015  3:53 AM  Result Value Ref Range Status   Specimen Description BLOOD  Final   Special Requests NONE  Final   Culture NO GROWTH 2 DAYS  Final   Report Status PENDING  Incomplete  Blood culture (routine x 2)     Status: None (Preliminary result)   Collection Time: 09/02/2015  4:05 AM  Result  Value Ref Range Status   Specimen Description BLOOD  Final   Special Requests NONE  Final   Culture NO GROWTH 2 DAYS  Final   Report Status PENDING  Incomplete  Urine culture     Status: None   Collection Time: 08/17/2015  5:14 AM  Result Value Ref Range Status   Specimen Description URINE, CLEAN CATCH  Final   Special Requests NONE  Final   Culture NO GROWTH Performed at [facility]   Final   Report Status 08/25/2015 FINAL  Final         Radiology Studies: No results found.      Scheduled Meds: . ampicillin-sulbactam (UNASYN) IV  3 g Intravenous Q6H  . antiseptic oral rinse  7 mL Mouth Rinse q12n4p  . chlorhexidine  15 mL Mouth Rinse BID  . enoxaparin (LOVENOX) injection  40 mg Subcutaneous Q24H  . FLUoxetine  20 mg Oral Daily  . guaiFENesin  1,200 mg Oral BID  . insulin aspart  0-15 Units Subcutaneous TID WC  . insulin aspart  0-5 Units Subcutaneous QHS  . ipratropium-albuterol  3 mL  Nebulization Q4H  . Ledipasvir-Sofosbuvir  1 tablet Oral Daily  . levothyroxine  12.5 mcg Intravenous QAC breakfast  . methylPREDNISolone (SOLU-MEDROL) injection  60 mg Intravenous Q12H   Continuous Infusions: . sodium chloride 100 mL/hr at 08/25/15 1700  . sodium chloride 10 mL/hr at 08/26/15 1800  . morphine 2 mg/hr (08/26/15 0600)     LOS: 3 days    Time spent: 25 minutes  Kathie Dike, MD [facility] Pager [phone] If 7PM-7AM, please contact night-coverage [email or web address] Password [facility] 08/27/2015, 6:48 AM

## 2015-08-27 NOTE — Progress Notes (Signed)
eLink Physician-Brief Progress Note Patient Name: AJAHNI BENZIGER DOB: 07-20-1950 MRN: [id number]   Date of Service  08/27/2015  HPI/Events of Note  Agitation, delirium in spite of morphine drip and xanax  eICU Interventions  Start haldol PRN, monitor qTC     Intervention Category Evaluation Type: Other  Ange Puskas 08/27/2015, 9:07 PM

## 2015-08-27 NOTE — Progress Notes (Signed)
Milk and molasses given by [place] [facility] nurse tech. Solution returned no stool noted. Patient not able to retain enema.

## 2015-08-27 NOTE — Consult Note (Signed)
Consultation Note Date: 08/27/2015   Patient Name: Brent Franco  DOB: [birth year]  MRN: [id number]  Age / Sex: 65 y.o., male  PCP: [name], NP Referring Physician: Kathie Dike, MD  Reason for Consultation: Disposition, Hospice Evaluation, Inpatient hospice referral, Psychosocial/spiritual support, Terminal Care and Withdrawal of life-sustaining treatment  HPI/Patient Profile: 65 y.o. male  with past medical history of Metastatic esophageal cancer,  hepatitis C, COPD, dysphagia with recurrent aspiration ? related to radiation treatment, 5 hospitalizations in the last 6 months here at [facility] admitted on 08/27/2015 with aspiration pneumonia, sepsis.   Clinical Assessment and Goals of Care: Mr. Brent Olp" Franco, is lying in bed with his family at bedside. Present today are wife, "Brent" Helene Franco, daughters Brent Franco and Brent Franco.  Brent Franco is unable to tell me the date, the season, or the month. Family and I go to my office for private conference.  Brent Franco shares what she is heard from Dr. Roderic Palau, that we expect Brent Franco to continue to aspirate, even on his own saliva, that he is not improving even with IV antibiotic treatment. I share a diagram of the chronic illness trajectory, and discuss the options that are available.    Family shares their experience with his recent (January 2017) diagnosis of esophageal cancer, and their acceptance that he will not survive his cancer; and that he is not improving from this pneumonia. Brent Franco shares her experience with hospice of [facility], her boyfriend had a glioblastoma and passed there. Family shares their concern that Brent Franco wants to return to their own home, the family feels that they and are unable to provide the care that he needs.  They share that Brent Franco stated, "if I'm going to die I want to go home".  We talk about the hospice home in [facility] being  a home. I share that they will bring able be able to bring pets, and family shares Brent Franco's love of their Yorkie Brent Franco.    We talk about the treatment plan including completing IV antibiotics tomorrow, we talk about lab draw schedule for tomorrow, I offer, on several occasions to stop the lab draw for tomorrow but Brent does not respond.  We talk about no IV fluids or antibiotics being offered at hospice home. I share that hospice home will offer food and drink, when it is safe for him to have intake.  family states that they understand the risks and are accepting that he will likely continue to aspirate, but would like for Brent Franco to be able to have a sip of soda or bite of ice cream if he so desires.  I share with [name] that we feel we can't cure his pneumonia, and that after talking with his family, they feel it would be best for him to go to hospice home of [facility]. I share that he will be able to see his Yorkie Brent Franco there and he smiles at me.  I ask if it this is okay with him, going to  the hospice home, and he clearly states yes.  Health care power of atty.  NEXT OF KIN   SUMMARY OF RECOMMENDATIONS   hospice home of [facility] Tuesday 6/20   Code Status/Advance Care Planning:  DNR, considering FULL comfort care tomorrow.   Symptom Management:   Per hospitalist  Palliative Prophylaxis:   Aspiration, Frequent Pain Assessment, Oral Care and Turn Reposition  Additional Recommendations (Limitations, Scope, Preferences):  We discuss comfort care, and stopping labs draws, but family is unable to agree to this today.  They talk about transitioing to full comfort care tomorrow.   Psycho-social/Spiritual:   Desire for further Chaplaincy support:no  Additional Recommendations: Caregiving  Support/Resources and Grief/Bereavement Support  Prognosis:   < 2 weeks, after discontinuation of IV antibiotics, based on recurrent hospitalizations 5 in the last 6  months, recurrent aspiration, chronic disease burden related to esophageal cancer, and families desire to focus on comfort only.   Discharge Planning: Hospice facility      Primary Diagnoses: Present on Admission:  . Sepsis ([place]) . Hepatitis C, chronic ([place]) . Hypothyroidism . COPD (chronic obstructive pulmonary disease) ([place]) . Acute on chronic respiratory failure with hypoxia and hypercapnia (HCC) . Aspiration pneumonia ([place]) . Hypernatremia  I have reviewed the medical record, interviewed the patient and family, and examined the patient. The following aspects are pertinent.  Past Medical History  Diagnosis Date  . COPD (chronic obstructive pulmonary disease) ([place])   . ETOH abuse     stopped 10 years ago  . Depression     suicide attempt 20 years ago  . Arthritis   . Asthma   . Viral hepatitis C   . Diabetes mellitus without complication ([place])   . Hypothyroidism   . Cancer ([place])     esophageal  . Pneumonia 02/2015  . Itching     both feet, known has athlet's feet now  . Esophageal cancer ([place]) 04/03/2015  . Pulmonary nodules 07/09/2015  . On home O2    Social History   Social History  . Marital Status: Married    Spouse Name: N/A  . Number of Children: N/A  . Years of Education: N/A   Social History Main Topics  . Smoking status: Former Smoker -- 1.50 packs/day for 47 years    Types: Cigarettes    Start date: 12/03/1966    Quit date: 12/08/2013  . Smokeless tobacco: Never Used  . Alcohol Use: No     Comment: Quit 2006: previously drank heavily. Had 1 drink 2-3 years ago (2013-2014)  . Drug Use: No     Comment: Previous IV drug use as teenager, none in 40 years. Last marijuanna use 02/22/15.  Marland Kitchen Sexual Activity: Yes   Other Topics Concern  . None   Social History Narrative   Family History  Problem Relation Age of Onset  . Pancreatic cancer Mother   . Lung cancer Father   . Colon cancer Neg Hx    Scheduled Meds: . ALPRAZolam  1 mg Oral TID  .  ampicillin-sulbactam (UNASYN) IV  3 g Intravenous Q6H  . antiseptic oral rinse  7 mL Mouth Rinse q12n4p  . chlorhexidine  15 mL Mouth Rinse BID  . enoxaparin (LOVENOX) injection  40 mg Subcutaneous Q24H  . FLUoxetine  20 mg Oral Daily  . guaiFENesin  1,200 mg Oral BID  . insulin aspart  0-15 Units Subcutaneous TID WC  . insulin aspart  0-5 Units Subcutaneous QHS  . ipratropium-albuterol  3 mL  Nebulization Q4H  . Ledipasvir-Sofosbuvir  1 tablet Oral Daily  . levothyroxine  12.5 mcg Intravenous QAC breakfast  . methylPREDNISolone (SOLU-MEDROL) injection  60 mg Intravenous Q12H   Continuous Infusions: . sodium chloride 100 mL/hr at 08/25/15 1700  . sodium chloride 10 mL/hr at 08/26/15 1800  . dextrose 5 % and 0.45% NaCl 75 mL/hr (08/27/15 0841)  . morphine 2 mg/hr (08/27/15 1400)   PRN Meds:.acetaminophen **OR** acetaminophen, albuterol, morphine injection, ondansetron **OR** ondansetron (ZOFRAN) IV Medications Prior to Admission:  Prior to Admission medications   Medication Sig Start Date End Date Taking? Authorizing Provider  acetaminophen (TYLENOL) 500 MG tablet Take 500 mg by mouth every 6 (six) hours as needed for fever.   Yes Historical Provider, MD  albuterol (PROVENTIL HFA;VENTOLIN HFA) 108 (90 BASE) MCG/ACT inhaler Inhale 1-2 puffs into the lungs every 6 (six) hours as needed for wheezing or shortness of breath.    Yes Historical Provider, MD  ALPRAZolam Duanne Moron) 1 MG tablet Take 1 tablet (1 mg total) by mouth every 8 (eight) hours as needed for anxiety. 07/24/15  Yes Patrici Ranks, MD  docusate sodium (COLACE) 100 MG capsule Take 100 mg by mouth 2 (two) times daily. 03/10/15  Yes Historical Provider, MD  FLUoxetine (PROZAC) 20 MG capsule Take 1 capsule (20 mg total) by mouth daily. 06/11/15  Yes Manon Hilding Kefalas, PA-C  guaiFENesin (MUCINEX) 600 MG 12 hr tablet Take 1 tablet (600 mg total) by mouth 2 (two) times daily. 08/13/15  Yes Kathie Dike, MD  HUMULIN R 100 UNIT/ML injection  Inject 2-12 Units into the skin 2 (two) times daily as needed for high blood sugar. 151-200 = 2 units, 201-250 = 4 units, 251-300 = 6 units, 301-350 = 8 units, 351-400 = 10 units, 401-450 = 12 units. 03/13/15  Yes Historical Provider, MD  ipratropium-albuterol (DUONEB) 0.5-2.5 (3) MG/3ML SOLN INHALE CONTENTS OF 1 VIAL IN NEBULIZER EVERY 4 HOURS AS NEEDED FOR SHORTNESS OF BREATH. 03/10/15  Yes Historical Provider, MD  Ledipasvir-Sofosbuvir (HARVONI) 90-400 MG TABS Take 1 tablet by mouth daily.    Yes Historical Provider, MD  levothyroxine (SYNTHROID, LEVOTHROID) 25 MCG tablet Take 25 mcg by mouth daily. 06/27/15  Yes Historical Provider, MD  lidocaine (XYLOCAINE) 2 % solution Swallow before meals as needed. 05/22/15  Yes Historical Provider, MD  lidocaine-prilocaine (EMLA) cream Apply a quarter size amount to port site 1 hour prior to chemo. Do not rub in. Cover with plastic wrap. 03/30/15  Yes Patrici Ranks, MD  meloxicam (MOBIC) 7.5 MG tablet TAKE ONE TABLET BY MOUTH ONCE DAILY 05/01/15  Yes Historical Provider, MD  metFORMIN (GLUCOPHAGE) 500 MG tablet Take 500 mg by mouth 2 (two) times daily. 03/10/15  Yes Historical Provider, MD  nitroGLYCERIN (NITROSTAT) 0.4 MG SL tablet Place 1 tablet (0.4 mg total) under the tongue every 5 (five) minutes as needed for chest pain. 09/21/14  Yes Herminio Commons, MD  ondansetron (ZOFRAN) 8 MG tablet TAKE 1 TABLET (8 MG TOTAL) BY MOUTH EVERY 8 (EIGHT) HOURS AS NEEDED FOR NAUSEA OR VOMITING. 07/02/15  Yes Patrici Ranks, MD  oxyCODONE (OXYCONTIN) 20 mg 12 hr tablet Take 1 tablet (20 mg total) by mouth every 8 (eight) hours. 08/21/15  Yes Baird Cancer, PA-C  oxyCODONE-acetaminophen (PERCOCET) 10-325 MG tablet Take 1 tablet by mouth every 4 (four) hours as needed for pain. 08/21/15  Yes Baird Cancer, PA-C  prochlorperazine (COMPAZINE) 10 MG tablet TAKE 1 TABLET (10 MG TOTAL) BY  MOUTH EVERY 6 (SIX) HOURS AS NEEDED FOR NAUSEA OR VOMITING. 07/02/15  Yes Patrici Ranks, MD  SPIRIVA HANDIHALER 18 MCG inhalation capsule Place 1 puff into inhaler and inhale daily. 12/04/14  Yes Historical Provider, MD  sucralfate (CARAFATE) 1 g tablet Take 1 tablet by mouth 2 (two) times daily as needed (throat pain due to acid reflux).  05/10/15  Yes Historical Provider, MD  tetrahydrozoline 0.05 % ophthalmic solution Place 1 drop into both eyes daily as needed (dry eyes).   Yes Historical Provider, MD  ACCU-CHEK AVIVA PLUS test strip 2 (two) times daily. use for testing 05/02/15   Historical Provider, MD  amoxicillin-clavulanate (AUGMENTIN) 875-125 MG tablet Take 1 tablet by mouth 2 (two) times daily. Patient not taking: Reported on 08/25/2015 08/13/15   Kathie Dike, MD  predniSONE (DELTASONE) 10 MG tablet Take 2 tablets (20 mg total) by mouth daily with breakfast. Take 40mg  po daily on day 1 and decrease by 10mg  daily until complete Patient not taking: Reported on 08/25/2015 08/13/15   Kathie Dike, MD   Allergies  Allergen Reactions  . Demerol [Meperidine] Rash   Review of Systems  Unable to perform ROS: Acuity of condition    Physical Exam  Constitutional: No distress.  Chronically ill appearing  HENT:  Head: Normocephalic and atraumatic.  Cardiovascular: Normal rate and regular rhythm.   Pulmonary/Chest: No respiratory distress.  Mild work of breathing noted, have fun nasal cannula  Abdominal: Soft. He exhibits no distension.  Neurological:  Oriented to person in Place only  Nursing note and vitals reviewed.   Vital Signs: BP 153/87 mmHg  Pulse 115  Temp(Src) 99.5 F (37.5 C) (Axillary)  Resp 25  Ht 6\' 1"  (1.854 m)  Wt 75.8 kg (167 lb 1.7 oz)  BMI 22.05 kg/m2  SpO2 92% Pain Assessment: CPOT POSS *See Group Information*: 1-Acceptable,Awake and alert Pain Score: 8    SpO2: SpO2: 92 % O2 Device:SpO2: 92 % O2 Flow Rate: .O2 Flow Rate (L/min): 15 L/min  IO: Intake/output summary:  Intake/Output Summary (Last 24 hours) at 08/27/15 1655 Last data  filed at 08/27/15 1615  Gross per 24 hour  Intake 825.55 ml  Output   2250 ml  Net -1424.45 ml    LBM: Last BM Date:  (unknown) Baseline Weight: Weight: 73.483 kg (162 lb) Most recent weight: Weight: 75.8 kg (167 lb 1.7 oz)     Palliative Assessment/Data:   Flowsheet Rows        Most Recent Value   Intake Tab    Referral Department  Hospitalist   Unit at Time of Referral  ICU   Palliative Care Primary Diagnosis  Cancer   Date Notified  08/26/2015   Palliative Care Type  New Palliative care   Reason for referral  Clarify Goals of Care   Date of Admission  08/10/2015   Date first seen by Palliative Care  08/27/15   # of days Palliative referral response time  3 Day(s)   # of days IP prior to Palliative referral  0   Clinical Assessment    Palliative Performance Scale Score  30%   Pain Max last 24 hours  Not able to report   Pain Min Last 24 hours  Not able to report   Dyspnea Max Last 24 Hours  Not able to report   Dyspnea Min Last 24 hours  Not able to report   Psychosocial & Spiritual Assessment    Palliative Care Outcomes  Patient/Family meeting held?  Yes   Who was at the meeting?  Wife Brent, daughters Caryl Pina and Brent Franco   Palliative Care Outcomes  Clarified goals of care, Provided advance care planning   Patient/Family wishes: Interventions discontinued/not started   Mechanical Ventilation, BiPAP, Tube feedings/TPN, PEG   Palliative Care follow-up planned  -- [Follow-up while at [facility]]      Time In: 1500 Time Out: 1620 Time Total: 80 minutes Greater than 50%  of this time was spent counseling and coordinating care related to the above assessment and plan.  Signed by: Drue Novel, NP   Please contact Palliative Medicine Team phone at [phone] for questions and concerns.  For individual provider: See Shea Evans

## 2015-08-27 NOTE — Progress Notes (Signed)
Pt o2 changed from 100% nrb to 8lpm HFNC spo2 92-96% pt tol well will continue to monitor.

## 2015-08-28 ENCOUNTER — Other Ambulatory Visit: Payer: Self-pay

## 2015-08-28 ENCOUNTER — Other Ambulatory Visit (HOSPITAL_COMMUNITY): Payer: Self-pay | Admitting: Oncology

## 2015-08-28 DIAGNOSIS — J9621 Acute and chronic respiratory failure with hypoxia: Secondary | ICD-10-CM

## 2015-08-28 DIAGNOSIS — J9622 Acute and chronic respiratory failure with hypercapnia: Secondary | ICD-10-CM

## 2015-08-28 MED ORDER — SODIUM CHLORIDE 0.9 % IV SOLN: 4.0000 mg/h | INTRAVENOUS | Status: DC

## 2015-08-28 MED ORDER — SODIUM CHLORIDE 0.9 % IV SOLN
4.0000 mg/h | INTRAVENOUS | Status: DC
  Filled 2015-08-28: qty 10

## 2015-08-28 MED ORDER — MORPHINE SULFATE (PF) 4 MG/ML IV SOLN
4.0000 mg | INTRAVENOUS | Status: AC
  Administered 2015-08-28: 4 mg via INTRAVENOUS

## 2015-08-28 MED ORDER — LORAZEPAM 2 MG/ML IJ SOLN
1.0000 mg | INTRAMUSCULAR | Status: DC | PRN
  Administered 2015-08-28: 2 mg via INTRAVENOUS
  Filled 2015-08-28: qty 1

## 2015-08-29 ENCOUNTER — Telehealth: Payer: Self-pay | Admitting: Internal Medicine

## 2015-08-29 LAB — CULTURE, BLOOD (ROUTINE X 2)
Culture: NO GROWTH
Culture: NO GROWTH

## 2015-08-29 NOTE — Telephone Encounter (Signed)
Noted. I called the family and expressed our condolences.

## 2015-08-29 NOTE — Telephone Encounter (Signed)
Pt is deceased. 

## 2015-08-29 NOTE — Telephone Encounter (Signed)
Noted  

## 2015-08-29 NOTE — Telephone Encounter (Signed)
noted 

## 2015-08-30 NOTE — Telephone Encounter (Signed)
I have notified [facility], so they will not try to contact the pt about his harvoni treatment.

## 2015-09-05 ENCOUNTER — Ambulatory Visit (HOSPITAL_COMMUNITY): Payer: Medicaid Other | Admitting: Hematology & Oncology

## 2015-09-08 NOTE — Progress Notes (Signed)
Daily Progress Note   Patient Name: Brent Franco       Date: 09-03-15 DOB: May 10, 1950  Age: 65 y.o. MRN#: [id number] Attending Physician: Kathie Dike, MD Primary Care Physician: [name], NP Admit Date: 08/22/2015  Reason for Consultation/Follow-up: Establishing goals of care, Hospice Evaluation, Inpatient hospice referral, Non pain symptom management, Psychosocial/spiritual support and Withdrawal of life-sustaining treatment  Subjective: Mr. Brent Franco is resting quietly in bed, his family is at bedside.  He is noted to have some work of breathing, and is constantly moaning, even with his continuous infusion of morphine. He has been unable to swallow his Xanax tablets, and they have not been administered under his tongue. They share that they stopped lab draws this morning and also have decided to focus on comfort only.  Return to [name]'s room later in the morning to check on symptom management, and just after noon, close to this time of passing. Support and encouragement given to family, and nursing staff. Support offered after patient death. Assisted staff with morgue care.  Family requests [facility] in [place].  Length of Stay: 4  Current Medications: Scheduled Meds:  . antiseptic oral rinse  7 mL Mouth Rinse q12n4p  . chlorhexidine  15 mL Mouth Rinse BID  . ipratropium-albuterol  3 mL Nebulization Q4H    Continuous Infusions: . sodium chloride 10 mL/hr at 08/26/15 1800  . morphine 5 mg/hr (September 03, 2015 1038)    PRN Meds: acetaminophen **OR** acetaminophen, albuterol, haloperidol lactate, LORazepam, morphine injection, ondansetron **OR** ondansetron (ZOFRAN) IV  Physical Exam  Constitutional: No distress.  Some work of breathing noted  HENT:  Head: Normocephalic and  atraumatic.  Cardiovascular: Normal rate and regular rhythm.   Pulmonary/Chest: He is in respiratory distress.  Some work of breathing noted, morphine continuous infusion effective  Abdominal: Soft. He exhibits no distension.  Neurological:  Sedated, mostly comfortable  Skin: Skin is warm and dry.  Nursing note and vitals reviewed.           Vital Signs: BP 130/82 mmHg  Pulse 95  Temp(Src) 97.1 F (36.2 C) (Oral)  Resp 12  Ht 6\' 1"  (1.854 m)  Wt 75.8 kg (167 lb 1.7 oz)  BMI 22.05 kg/m2  SpO2 94% SpO2: SpO2: 94 % O2 Device: O2 Device: High Flow Nasal Cannula O2 Flow  Rate: O2 Flow Rate (L/min): 15 L/min  Intake/output summary:  Intake/Output Summary (Last 24 hours) at September 11, 2015 1108 Last data filed at 09/11/2015 0500  Gross per 24 hour  Intake 2080.05 ml  Output   1450 ml  Net 630.05 ml   LBM: Last BM Date: 2015/09/11 Baseline Weight: Weight: 73.483 kg (162 lb) Most recent weight: Weight: 75.8 kg (167 lb 1.7 oz)       Palliative Assessment/Data:    Flowsheet Rows        Most Recent Value   Intake Tab    Referral Department  Hospitalist   Unit at Time of Referral  ICU   Palliative Care Primary Diagnosis  Cancer   Date Notified  08/21/2015   Palliative Care Type  New Palliative care   Reason for referral  Clarify Goals of Care   Date of Admission  08/31/2015   Date first seen by Palliative Care  08/27/15   # of days Palliative referral response time  3 Day(s)   # of days IP prior to Palliative referral  0   Clinical Assessment    Palliative Performance Scale Score  30%   Pain Max last 24 hours  Not able to report   Pain Min Last 24 hours  Not able to report   Dyspnea Max Last 24 Hours  Not able to report   Dyspnea Min Last 24 hours  Not able to report   Psychosocial & Spiritual Assessment    Palliative Care Outcomes    Patient/Family meeting held?  Yes   Who was at the meeting?  Wife Sissy, daughters Caryl Pina and Museum/gallery conservator   Palliative Care Outcomes  Clarified goals of  care, Provided advance care planning   Patient/Family wishes: Interventions discontinued/not started   Mechanical Ventilation, BiPAP, Tube feedings/TPN, PEG   Palliative Care follow-up planned  -- [Follow-up while at [facility]]      Patient Active Problem List   Diagnosis Date Noted  . Hypernatremia 08/27/2015  . Palliative care encounter   . Goals of care, counseling/discussion   . Sepsis ([place]) 08/11/2015  . Encephalopathy 08/16/2015  . Altered mental status 08/09/2015  . Aspiration pneumonia ([place]) 08/09/2015  . Acute on chronic respiratory failure with hypoxia and hypercapnia ([place]) 08/09/2015  . Anxiety 08/09/2015  . Chronic pain 08/09/2015  . Hypothyroidism 08/09/2015  . Acute encephalopathy 08/09/2015  . Hepatitis C, chronic ([place]) 08/09/2015  . Hypercarbia   . Malnutrition of moderate degree 07/20/2015  . Acute respiratory failure with hypoxia ([place]) 07/20/2015  . Lobar pneumonia ([place]) 07/16/2015  . Hypoxemia 07/16/2015  . Diabetes mellitus without complication, with long-term current use of insulin ([place]) 07/09/2015  . Pulmonary nodules 07/09/2015  . Pain in the chest   . Hepatitis C 06/13/2015  . Healthcare-associated pneumonia   . Pneumonia 04/15/2015  . Liver lesion 04/06/2015  . Esophageal cancer ([place]) 04/03/2015  . Mucosal abnormality of esophagus   . History of colonic polyps   . Diverticulosis of colon without hemorrhage   . Dysphagia 02/27/2015  . Encounter for screening colonoscopy 02/27/2015  . Elevated transaminase level 08/24/2014  . COPD (chronic obstructive pulmonary disease) ([place])   . Depression     Palliative Care Assessment & Plan   Patient Profile: 35 yom who has had repeated admissions for pneumonia/encephalopathy. He has a history of esophageal cancer with radiation that has likely resulted in dysphagia. He presents with acute on chronic respiratory failure and pneumonia. He was started on antibiotics and BiPAP therapy. He  was initially weaned off of  BiPAP, but despite aggressive treatment his overall condition has failed to improve. He continues to be significantly short of breath and having a significant oxygen requirement. After several conversations with the family, they decided to make him DO NOT RESUSCITATE pursue comfort measures only. Palliative care is following. He is on morphine infusion for respiratory distress and pain. Initially plan was for residential hospice although he may have an in-hospital death.  Assessment: as above  Recommendations/Plan:  continuous full comfort care, anticipated Hospital death.  Goals of Care and Additional Recommendations:  Limitations on Scope of Treatment: Full Comfort Care  Code Status:    Code Status Orders        Start     Ordered   08/25/15 0827  Do not attempt resuscitation (DNR)   Continuous    Question Answer Comment  In the event of cardiac or respiratory ARREST Do not call a "code blue"   In the event of cardiac or respiratory ARREST Do not perform Intubation, CPR, defibrillation or ACLS   In the event of cardiac or respiratory ARREST Use medication by any route, position, wound care, and other measures to relive pain and suffering. May use oxygen, suction and manual treatment of airway obstruction as needed for comfort.      08/25/15 0827    Code Status History    Date Active Date Inactive Code Status Order ID Comments User Context   09/06/2015 11:53 AM 08/25/2015  8:27 AM Full Code [id number]  Kathie Dike, MD Inpatient   08/09/2015  4:37 PM 08/13/2015  7:43 PM Full Code [id number]  Vianne Bulls, MD ED   07/16/2015  5:31 PM 07/21/2015  3:43 PM Full Code [id number]  Roney Jaffe, MD Inpatient   07/08/2015  1:13 AM 07/09/2015  7:17 PM Full Code [id number]  Orvan Falconer, MD Inpatient   04/16/2015  8:44 AM 04/21/2015  8:09 PM Full Code [id number]  Donne Hazel, MD Inpatient   04/15/2015  6:34 PM 04/16/2015  8:44 AM DNR [id number]  Oswald Hillock, MD Inpatient   08/23/2014  4:55 PM 08/24/2014  3:11  PM Full Code [id number]  Erline Hau, MD Inpatient       Prognosis:   Hours - Days  Discharge Planning:  Anticipated Hospital Death  Care plan was discussed with nursing staff, CM, SW and Dr. Roderic Palau.   Thank you for allowing the Palliative Medicine Team to assist in the care of this patient.   Time In: 0840 1210 Time Out: [id number] Total Time 50 minutes Total  Prolonged Time Billed  yes       Greater than 50%  of this time was spent counseling and coordinating care related to the above assessment and plan.  Dove,Tasha A, NP  Please contact Palliative Medicine Team phone at [phone] for questions and concerns.

## 2015-09-08 NOTE — Progress Notes (Signed)
In room with family. Patient found to have heart rate 0 bpm, no BP, no respirations at 1220. Confirmed by Burna Sis, RN and Quinn Axe, NP. Family at bedside. MD notified. CDS notified.

## 2015-09-08 NOTE — Progress Notes (Signed)
PROGRESS NOTE    Brent Franco  [id number] DOB: 01/29/1951 DOA: 09/04/2015 PCP: [name], NP  Outpatient Specialists:  Gastroenterology; Daneil Dolin, MD  Brief Narrative:  72 yom who has had repeated admissions for pneumonia/encephalopathy. He has a history of esophageal cancer with radiation that has likely resulted in dysphagia. He presents with acute on chronic respiratory failure and pneumonia. He was started on antibiotics and BiPAP therapy. He was initially weaned off of BiPAP, but despite aggressive treatment his overall condition has failed to improve. He continues to be significantly short of breath and having a significant oxygen requirement. After several conversations with the family, they decided to make him DO NOT RESUSCITATE pursue comfort measures only. Palliative care is following. He is on morphine infusion for respiratory distress and pain. Initially plan was for residential hospice although he may have an in-hospital death.   Assessment & Plan:   Active Problems:   COPD (chronic obstructive pulmonary disease) ([place])   Dysphagia   Diabetes mellitus without complication, with long-term current use of insulin (HCC)   Aspiration pneumonia (HCC)   Acute on chronic respiratory failure with hypoxia and hypercapnia (HCC)   Hypothyroidism   Hepatitis C, chronic (HCC)   Sepsis (HCC)   Encephalopathy   Hypernatremia   Palliative care encounter   Goals of care, counseling/discussion  1. Sepsis. Likely related to pneumonia. Blood cultures with no growth in 3 days. He has been treated with Unasyn. Lactic acid has returned to normal limits with hydration. WBC remains elevated possibly secondary to steroid use.  2. Aspiration pneumonia. This appears to be a recurrent issue since patient has underlying dysphagia. Treated with intravenous Unasyn.  3. Acute on chronic respiratory failure with hypoxia. Patient is on high flow oxygen. BiPAP therapy discontinued due to patient's  agitation and DNR status. Discussed with patient's wife and after talking it over with her family, they have decided not to pursue intubation and agree to DNR. Patient has been started on morphine for respiratory distress.  4. Acute encephalopathy. Likely related to sepsis/hypoxia.   5. Diabetes.   6. Hepatitis C, patient is on treatment with Harvoni. 7. Dysphagia. Currently he is on a dysphagia 2 diet with thin liquids. Ultimately he needs a modified barium swallow as an outpatient. 8. COPD. Continue bronchodilators and pulmonary hygiene.  9. Discussion. Patient has had recurrent admission for encephalopathy and pneumonia, which is likely due to aspiration in the setting of dysphagia. He has been on aggressive management for the last 5 days without any significant improvement. Family has elected DNR status and no Bipap. Palliative care has been consulted. Since he has not improved, they have decided to transition to full comfort care. He is on morphine infusion for comfort. Anticipate in hospital death.    DVT prophylaxis: Lovenox Code Status: DNR Family Communication: discussed with wife  Disposition Plan: patient is now comfort care, anticipate in hospital death   Consultants:   ST- Dysphagia 2 diet  Palliative  Procedures:     Antimicrobials:   Unasyn 6/16 >> 6/20   Subjective: Does not answer questions. Wife says he had a rough night and was in pain.  Objective: Filed Vitals:   Aug 29, 2015 0200 August 29, 2015 0300 08-29-2015 0400 08/29/2015 0426  BP: 134/70  134/67   Pulse: 103 99    Temp:      TempSrc:      Resp: 24 23 23    Height:      Weight:  SpO2: 92% 93%  96%    Intake/Output Summary (Last 24 hours) at 09-03-2015 0544 Last data filed at 03-Sep-2015 0400  Gross per 24 hour  Intake 2203.55 ml  Output   2250 ml  Net -46.45 ml   Filed Weights   09/06/2015 0800 08/25/15 0356 08/26/15 0500  Weight: 72.3 kg (159 lb 6.3 oz) 75.8 kg (167 lb 1.7 oz) 75.8 kg (167 lb 1.7 oz)     Examination:  General exam: patient is moaning in pain, does not answer questions Respiratory system: Clear to auscultation. Increased resp effort. Cardiovascular system: S1 & S2 heard, RRR. No JVD, murmurs, rubs, gallops or clicks. No pedal edema. Gastrointestinal system: Abdomen is nondistended, soft and nontender. No organomegaly or masses felt. Normal bowel sounds heard. Central nervous system: does not answer questions. Extremities: Symmetric 5 x 5 power. Skin: No rashes, lesions or ulcers Psychiatry: cannot assess due to mental status.   Data Reviewed: I have personally reviewed following labs and imaging studies  CBC:  Recent Labs Lab 08/22/2015 0353 09/04/2015 1257 08/25/15 0420 08/27/15 0443  WBC 17.7* 13.1* 13.2* 15.3*  NEUTROABS 16.1*  --   --   --   HGB 10.5* 9.2* 9.2* 9.5*  HCT 33.7* 29.4* 29.2* 31.0*  MCV 99.4 99.3 97.0 99.0  PLT 225 182 199 0000000   Basic Metabolic Panel:  Recent Labs Lab 08/31/2015 0353 08/29/2015 1257 08/25/15 0420 08/27/15 0443  NA 138  --  143 153*  K 4.7  --  4.1 4.0  CL 101  --  111 116*  CO2 25  --  22 25  GLUCOSE 181*  --  154* 166*  BUN 26*  --  22* 37*  CREATININE 1.39* 1.19 1.05 1.14  CALCIUM 8.4*  --  7.9* 8.5*   GFR: Estimated Creatinine Clearance: 71.1 mL/min (by C-G formula based on Cr of 1.14). Liver Function Tests:  Recent Labs Lab 08/15/2015 0353  AST 69*  ALT 62  ALKPHOS 284*  BILITOT 1.1  PROT 7.2  ALBUMIN 2.6*   No results for input(s): LIPASE, AMYLASE in the last 168 hours.  Recent Labs Lab 08/25/2015 0353  AMMONIA 49*   Coagulation Profile: No results for input(s): INR, PROTIME in the last 168 hours. Cardiac Enzymes:  Recent Labs Lab 08/23/2015 0353  TROPONINI 0.04*   CBG:  Recent Labs Lab 08/26/15 2121 08/27/15 0744 08/27/15 1134 08/27/15 1643 08/27/15 2016  GLUCAP 119* 198* 232* 185* 168*   Urine analysis:    Component Value Date/Time   COLORURINE YELLOW 08/23/2015 0511    APPEARANCEUR HAZY* 08/20/2015 0511   LABSPEC >1.030* 08/20/2015 0511   PHURINE 5.5 08/10/2015 0511   GLUCOSEU NEGATIVE 08/15/2015 0511   HGBUR NEGATIVE 08/18/2015 0511   BILIRUBINUR NEGATIVE 09/02/2015 0511   KETONESUR NEGATIVE 08/23/2015 0511   PROTEINUR 30* 08/25/2015 0511   UROBILINOGEN 0.2 08/23/2014 1321   NITRITE NEGATIVE 08/27/2015 0511   LEUKOCYTESUR NEGATIVE 08/27/2015 0511   Sepsis Labs: @LABRCNTIP (procalcitonin:4,lacticidven:4)  ) Recent Results (from the past 240 hour(s))  Blood culture (routine x 2)     Status: None (Preliminary result)   Collection Time: 08/27/2015  3:53 AM  Result Value Ref Range Status   Specimen Description BLOOD  Final   Special Requests NONE  Final   Culture NO GROWTH 3 DAYS  Final   Report Status PENDING  Incomplete  Blood culture (routine x 2)     Status: None (Preliminary result)   Collection Time: 08/29/2015  4:05 AM  Result  Value Ref Range Status   Specimen Description BLOOD  Final   Special Requests NONE  Final   Culture NO GROWTH 3 DAYS  Final   Report Status PENDING  Incomplete  Urine culture     Status: None   Collection Time: 09/03/2015  5:14 AM  Result Value Ref Range Status   Specimen Description URINE, CLEAN CATCH  Final   Special Requests NONE  Final   Culture NO GROWTH Performed at [facility]   Final   Report Status 08/25/2015 FINAL  Final     Scheduled Meds: . ALPRAZolam  1 mg Oral TID  . ampicillin-sulbactam (UNASYN) IV  3 g Intravenous Q6H  . antiseptic oral rinse  7 mL Mouth Rinse q12n4p  . chlorhexidine  15 mL Mouth Rinse BID  . enoxaparin (LOVENOX) injection  40 mg Subcutaneous Q24H  . FLUoxetine  20 mg Oral Daily  . guaiFENesin  1,200 mg Oral BID  . insulin aspart  0-15 Units Subcutaneous TID WC  . insulin aspart  0-5 Units Subcutaneous QHS  . ipratropium-albuterol  3 mL Nebulization Q4H  . Ledipasvir-Sofosbuvir  1 tablet Oral Daily  . levothyroxine  12.5 mcg Intravenous QAC breakfast  .  methylPREDNISolone (SOLU-MEDROL) injection  60 mg Intravenous Q12H   Continuous Infusions: . sodium chloride 100 mL/hr at 08/25/15 1700  . sodium chloride 10 mL/hr at 08/26/15 1800  . dextrose 5 % and 0.45% NaCl 75 mL/hr at 08/27/15 1941  . morphine 2 mg/hr (08/27/15 1800)     LOS: 4 days    Time spent: 25 minutes  Kathie Dike, MD [facility] Pager [phone] If 7PM-7AM, please contact night-coverage [email or web address] Password [facility] 09/23/2015, 5:44 AM

## 2015-09-08 NOTE — Discharge Summary (Signed)
Death Summary  Brent Franco [id number] DOB: 1950/09/11 DOA: 2015/09/05  PCP: [name], NP  Admit date: 09-05-15 Date of Death: September 09, 2015 Time of Death: 12:20PM   History of present illness and hospital course:  74 yom who has had repeated admissions for pneumonia/encephalopathy. He has a history of esophageal cancer with radiation that has likely resulted in dysphagia. He presents with acute on chronic respiratory failure and pneumonia. He was started on antibiotics and BiPAP therapy. He was initially weaned off of BiPAP, but despite aggressive treatment his overall condition has failed to improve. He continues to be significantly short of breath and having a significant oxygen requirement. After several conversations with the family, they decided to make him DO NOT RESUSCITATE pursue comfort measures only. Palliative care consulted and followed patient in the hospital. He was started on a morphine infusion for respiratory distress and pain. His overall condition continued to decline. At 12:20pm on 2022/09/09 he was pronounced dead. Family was at bedside.  Final Diagnoses:  1.  Acute on chronic respiratory failure with hypoxia and hypercapnia 2. Aspiration pneumonia 3. Dysphagia 4. Sepsis 5. Acute encephalopathy 6. Hepatitis C 7. COPD 8. Diabetes mellitus on long term insulin 9. Hypernatremia 10. Hypothyroidism   The results of significant diagnostics from this hospitalization (including imaging, microbiology, ancillary and laboratory) are listed below for reference.    Significant Diagnostic Studies: Dg Chest 2 View  08/09/2015  CLINICAL DATA:  Weakness. History of esophageal cancer. Recently treated for pneumonia. EXAM: CHEST  2 VIEW COMPARISON:  07/16/2015 FINDINGS: Right-sided injectable port is in stable position. Cardiomediastinal silhouette is normal. Mediastinal contours appear intact. There is no evidence of pleural effusion or pneumothorax. There are persistent areas of  subtle airspace consolidation in the left lung base and less so in the right lung base. Osseous structures are without acute abnormality. Soft tissues are grossly normal. IMPRESSION: Persistent airspace consolidation in the left lung base and less so in the right lung base concerning for ongoing pneumonia. Electronically Signed   By: Fidela Salisbury M.D.   On: 08/09/2015 14:26   Ct Head Wo Contrast  05-Sep-2015  CLINICAL DATA:  Initial evaluation for acute altered mental status. EXAM: CT HEAD WITHOUT CONTRAST TECHNIQUE: Contiguous axial images were obtained from the base of the skull through the vertex without intravenous contrast. COMPARISON:  Prior CT from 08/09/2015. FINDINGS: There is no acute intracranial hemorrhage or infarct. No mass lesion or midline shift. Gray-white matter differentiation is well maintained. Ventricles are normal in size without evidence of hydrocephalus. CSF containing spaces are within normal limits. No extra-axial fluid collection. Age-appropriate cerebral atrophy. The calvarium is intact. Orbital soft tissues are within normal limits. The paranasal sinuses and mastoid air cells are well pneumatized and free of fluid. Scalp soft tissues are unremarkable. IMPRESSION: Negative head CT.  No acute intracranial process identified. Electronically Signed   By: Jeannine Boga M.D.   On: 09/05/2015 05:09   Ct Head Wo Contrast  08/09/2015  CLINICAL DATA:  Altered mental status.  Esophageal carcinoma. EXAM: CT HEAD WITHOUT CONTRAST TECHNIQUE: Contiguous axial images were obtained from the base of the skull through the vertex without intravenous contrast. COMPARISON:  None. FINDINGS: The ventricles are normal in size and configuration. There is no intracranial mass, hemorrhage, extra-axial fluid collection, or midline shift. The gray-white compartments appear normal. No acute infarct evident. The bony calvarium appears intact. The mastoid air cells are clear. Orbits appear symmetric  bilaterally. IMPRESSION: Study within normal limits. Electronically Signed  By: Lowella Grip III M.D.   On: 08/09/2015 15:57   Ct Chest W Contrast  08/10/2015  CLINICAL DATA:  Pneumonia. History of COPD. Esophageal cancer. Pulmonary nodules. EXAM: CT CHEST WITH CONTRAST TECHNIQUE: Multidetector CT imaging of the chest was performed during intravenous contrast administration. CONTRAST:  76mL ISOVUE-300 IOPAMIDOL (ISOVUE-300) INJECTION 61% COMPARISON:  07/08/2015 FINDINGS: Mediastinum/Nodes: Heart is normal size. Aorta is normal caliber. Scattered calcifications in the left anterior descending and right coronary arteries. No definite mediastinal, hilar or axillary adenopathy. Distal esophageal wall thickening again noted and stable in area of known mass. Lungs/Pleura: Airspace disease again noted in the lower lobes with ground-glass opacities and air bronchograms, slightly improved since prior study. Right middle lobe and lingular consolidation also partially improved. Ground-glass opacities in the upper lobes also improved but persistent. Small right upper lobe nodule measures 9 mm compared to 10 mm previously, not significantly changed. Other scattered small pulmonary nodules unchanged. No pleural effusions. Upper abdomen: Subtle area of low-density in the posterior right hepatic lobe again noted. This may have enlarged in size but is difficult to visualize on this chest CT. Musculoskeletal: No acute bony abnormality or focal bone lesion. IMPRESSION: Improving bilateral pneumonia since prior study. There continue be patchy bilateral ground-glass and more consolidative airspace opacities in the lower lobes with ground-glass opacities in both upper lobes. Improving but persistent opacities in the lingula and right middle lobe as well. Small scattered pulmonary nodules, the largest 9 mm in the right upper lobe, stable. Vague ill-defined low-density area posteriorly in the right hepatic lobe has slightly  enlarged since prior MRI from 04/30/2015, but difficult to visualize well on this chest CT. Recommend attention on follow-up abdominal imaging. Coronary artery disease. Electronically Signed   By: Rolm Baptise M.D.   On: 08/10/2015 13:50   Ct Angio Chest Pe W/cm &/or Wo Cm  08/26/2015  CLINICAL DATA:  Patient was found unresponsive. Pinpoint pupils. Patient cool to touch. EXAM: CT ANGIOGRAPHY CHEST WITH CONTRAST TECHNIQUE: Multidetector CT imaging of the chest was performed using the standard protocol during bolus administration of intravenous contrast. Multiplanar CT image reconstructions and MIPs were obtained to evaluate the vascular anatomy. CONTRAST:  100 mL Isovue-370 COMPARISON:  08/10/2015 FINDINGS: Technically adequate study with good opacification of the central and segmental pulmonary arteries. No focal filling defects. No evidence of significant pulmonary embolus. Normal heart size. Normal caliber thoracic aorta. No aortic dissection. Calcification of the aorta. Esophagus is decompressed. No significant lymphadenopathy in the chest. Evaluation of lungs is limited due to respiratory motion artifact. There are patchy nodular ground-glass infiltrates scattered throughout both lungs, more prominent in the bases. Peribronchovascular nodules in the bases. Changes are consistent with bilateral bronchopneumonia. Superimposed edema may also be present. Medial consolidation in both lungs posteriorly. This could be compatible with radiation change if the patient has such a history. Minimal left pleural effusion. No pneumothorax. Included portions of the upper abdominal organs demonstrate diffuse fatty infiltration of the liver. Degenerative changes in the spine. No destructive bone lesions. Review of the MIP images confirms the above findings. IMPRESSION: No evidence of significant pulmonary embolus. Patchy nodular ground-glass infiltrates with peribronchial nodules consistent with bronchopneumonia. Possible  superimposed interstitial edema. Small left pleural effusion. Posterior medial lung consolidation in a pattern suggesting radiation change. Electronically Signed   By: Lucienne Capers M.D.   On: 08/14/2015 05:14   Dg Chest Port 1 View  08/23/2015  CLINICAL DATA:  Acute onset of altered mental status, generalized weakness  and shortness of breath. Initial encounter. EXAM: PORTABLE CHEST 1 VIEW COMPARISON:  Chest radiograph performed 08/09/2015, and CT of the chest performed 08/10/2015 FINDINGS: The lungs are well-aerated. Vascular congestion is noted. Mild bibasilar opacities raise concern for mild interstitial edema. There is no evidence of pleural effusion or pneumothorax. The cardiomediastinal silhouette is within normal limits. No acute osseous abnormalities are seen. A right-sided chest port is noted ending about the cavoatrial junction. IMPRESSION: Vascular congestion noted. Mild bibasilar opacities raise concern for mild interstitial edema. Electronically Signed   By: Garald Balding M.D.   On: 08/19/2015 04:01    Microbiology: Recent Results (from the past 240 hour(s))  Blood culture (routine x 2)     Status: None (Preliminary result)   Collection Time: 09/04/2015  3:53 AM  Result Value Ref Range Status   Specimen Description BLOOD LEFT FOREARM  Final   Special Requests BOTTLES DRAWN AEROBIC AND ANAEROBIC 10CC  Final   Culture NO GROWTH 4 DAYS  Final   Report Status PENDING  Incomplete  Blood culture (routine x 2)     Status: None (Preliminary result)   Collection Time: 09/07/2015  4:05 AM  Result Value Ref Range Status   Specimen Description BLOOD LEFT FOREARM  Final   Special Requests BOTTLES DRAWN AEROBIC AND ANAEROBIC 10CC  Final   Culture NO GROWTH 4 DAYS  Final   Report Status PENDING  Incomplete  Urine culture     Status: None   Collection Time: 08/31/2015  5:14 AM  Result Value Ref Range Status   Specimen Description URINE, CLEAN CATCH  Final   Special Requests NONE  Final    Culture NO GROWTH Performed at [facility]   Final   Report Status 08/25/2015 FINAL  Final     Labs: Basic Metabolic Panel:  Recent Labs Lab 09/05/2015 0353 08/19/2015 1257 08/25/15 0420 08/27/15 0443  NA 138  --  143 153*  K 4.7  --  4.1 4.0  CL 101  --  111 116*  CO2 25  --  22 25  GLUCOSE 181*  --  154* 166*  BUN 26*  --  22* 37*  CREATININE 1.39* 1.19 1.05 1.14  CALCIUM 8.4*  --  7.9* 8.5*   Liver Function Tests:  Recent Labs Lab 08/29/2015 0353  AST 69*  ALT 62  ALKPHOS 284*  BILITOT 1.1  PROT 7.2  ALBUMIN 2.6*   No results for input(s): LIPASE, AMYLASE in the last 168 hours.  Recent Labs Lab 09/05/2015 0353  AMMONIA 49*   CBC:  Recent Labs Lab 08/27/2015 0353 09/05/2015 1257 08/25/15 0420 08/27/15 0443  WBC 17.7* 13.1* 13.2* 15.3*  NEUTROABS 16.1*  --   --   --   HGB 10.5* 9.2* 9.2* 9.5*  HCT 33.7* 29.4* 29.2* 31.0*  MCV 99.4 99.3 97.0 99.0  PLT 225 182 199 176   Cardiac Enzymes:  Recent Labs Lab 08/25/2015 0353  TROPONINI 0.04*   D-Dimer No results for input(s): DDIMER in the last 72 hours. BNP: Invalid input(s): POCBNP CBG:  Recent Labs Lab 08/26/15 2121 08/27/15 0744 08/27/15 1134 08/27/15 1643 08/27/15 2016  GLUCAP 119* 198* 232* 185* 168*   Anemia work up No results for input(s): VITAMINB12, FOLATE, FERRITIN, TIBC, IRON, RETICCTPCT in the last 72 hours. Urinalysis    Component Value Date/Time   COLORURINE YELLOW 09/01/2015 0511   APPEARANCEUR HAZY* 08/09/2015 0511   LABSPEC >1.030* 09/02/2015 0511   PHURINE 5.5 08/14/2015 0511   GLUCOSEU  NEGATIVE 08/29/2015 0511   HGBUR NEGATIVE 09/05/2015 0511   BILIRUBINUR NEGATIVE 08/27/2015 0511   KETONESUR NEGATIVE 08/23/2015 0511   PROTEINUR 30* 08/11/2015 0511   UROBILINOGEN 0.2 08/23/2014 1321   NITRITE NEGATIVE 08/25/2015 0511   LEUKOCYTESUR NEGATIVE 08/27/2015 0511   Sepsis Labs Invalid input(s): PROCALCITONIN,  WBC,  LACTICIDVEN     SIGNED:  Kathie Dike,  MD  [facility] 09/02/2015, 6:42 PM Pager   If 7PM-7AM, please contact night-coverage [email or web address] Password [email or web address]

## 2015-09-08 NOTE — Progress Notes (Signed)
Family and patient refusing lab work requesting that labs be cancelled.

## 2015-09-08 NOTE — Progress Notes (Signed)
Wasted 250cc of IV Morphine in sink with Loni Muse, RN

## 2015-09-08 DEATH — deceased

## 2015-09-26 ENCOUNTER — Encounter (HOSPITAL_COMMUNITY): Payer: Medicaid Other

## 2015-10-25 ENCOUNTER — Ambulatory Visit: Payer: Medicaid Other | Admitting: Nurse Practitioner

## 2017-03-06 IMAGING — US US ABDOMEN COMPLETE W/ ELASTOGRAPHY
2 series · 13 of 25 positions shown · non-contrast
Comparison: None.

CLINICAL DATA: IV drug abuse.  Hepatitis-C diagnosed 40 years ago.



[Series 1: us abdomen complete w/ elastography · 0.25mm/px · 11 of 105 slices shown (1 of 2)]
[im 1/105]
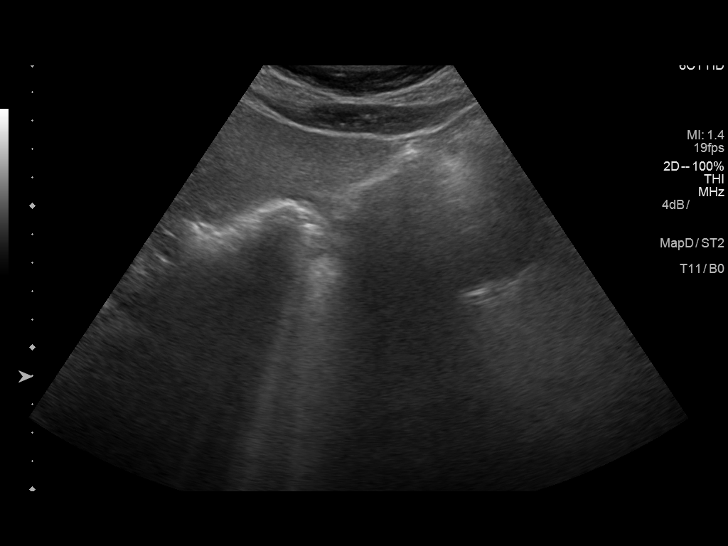
[im 10/105]
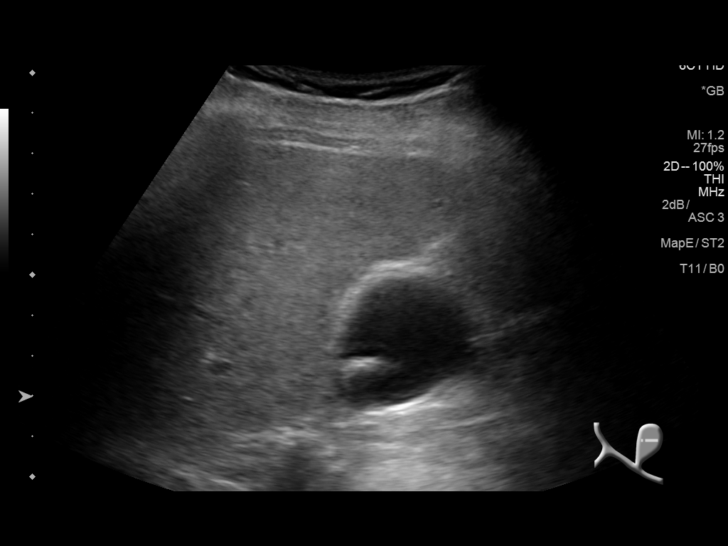
[im 20/105]
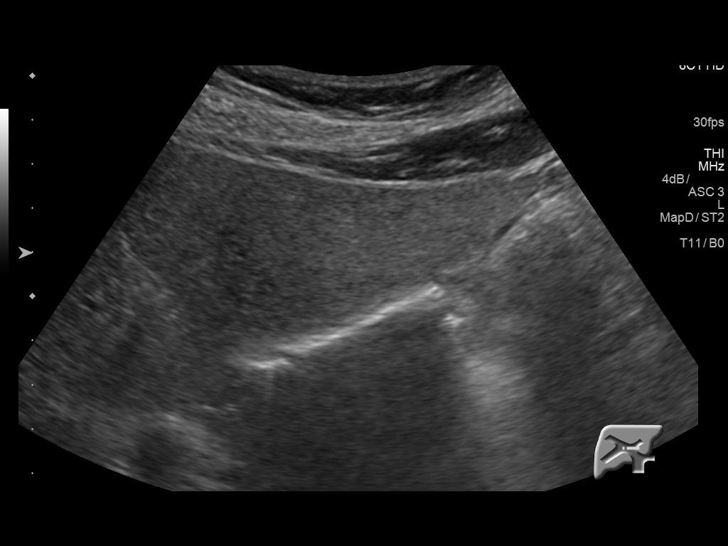
[im 30/105]
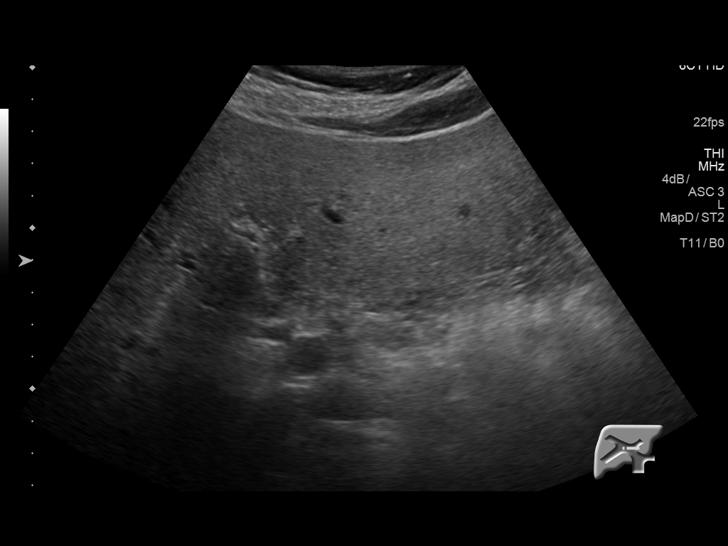
[im 40/105]
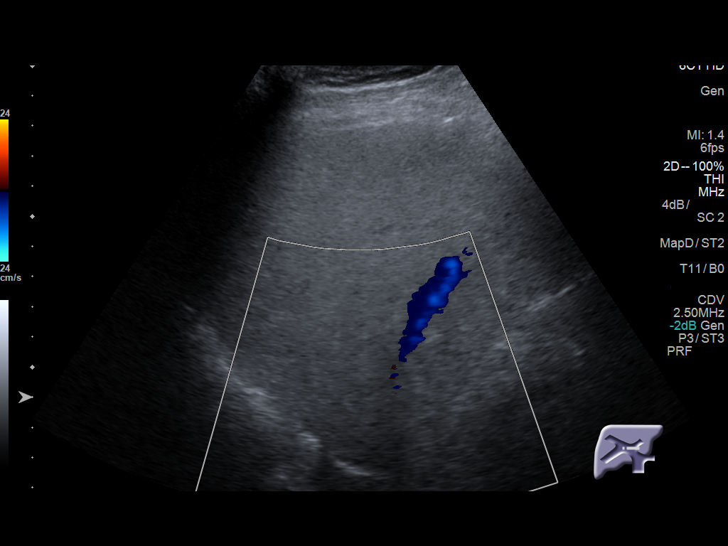
[im 50/105]
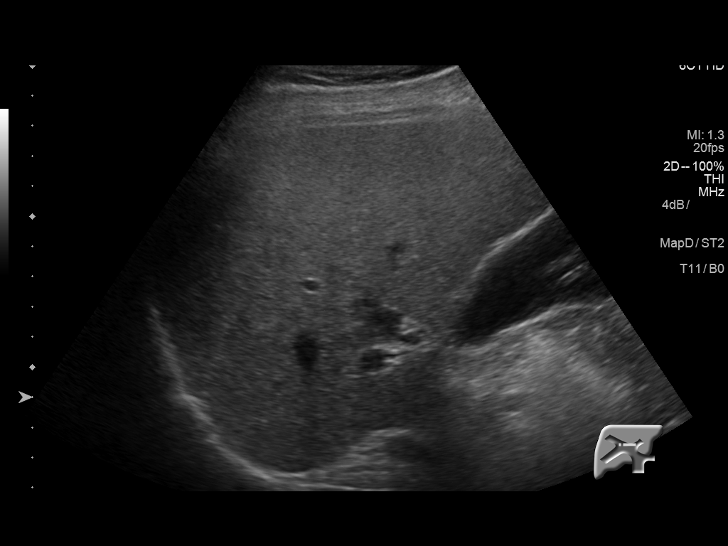
[im 60/105]
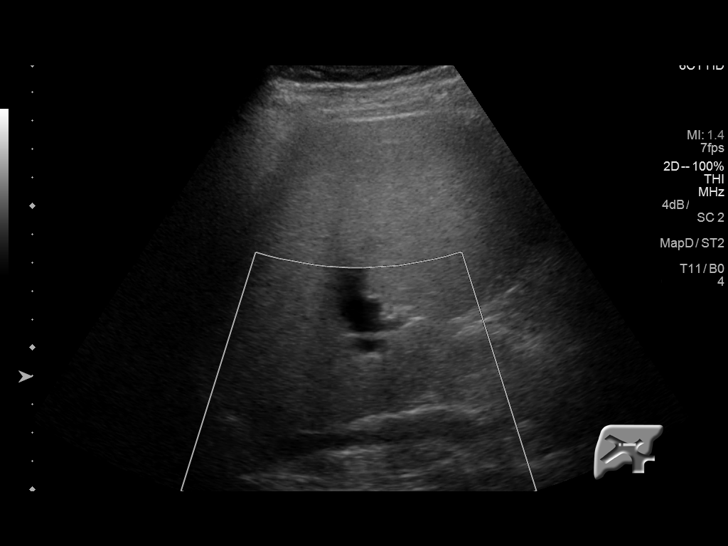
[im 70/105]
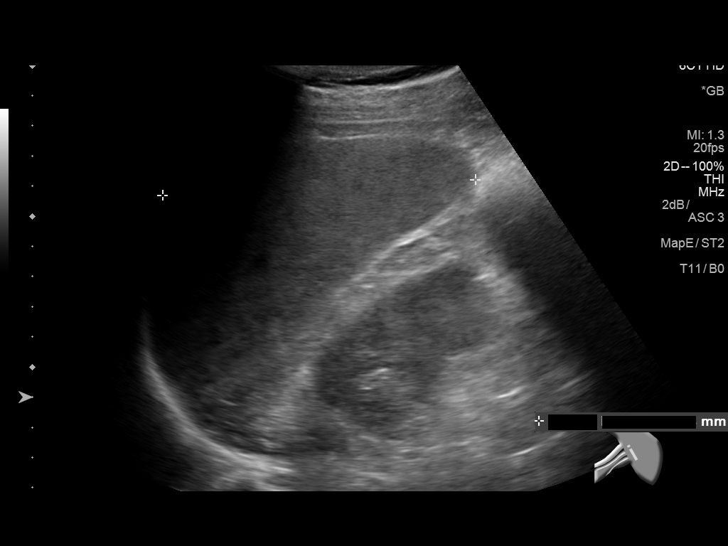
[im 80/105]
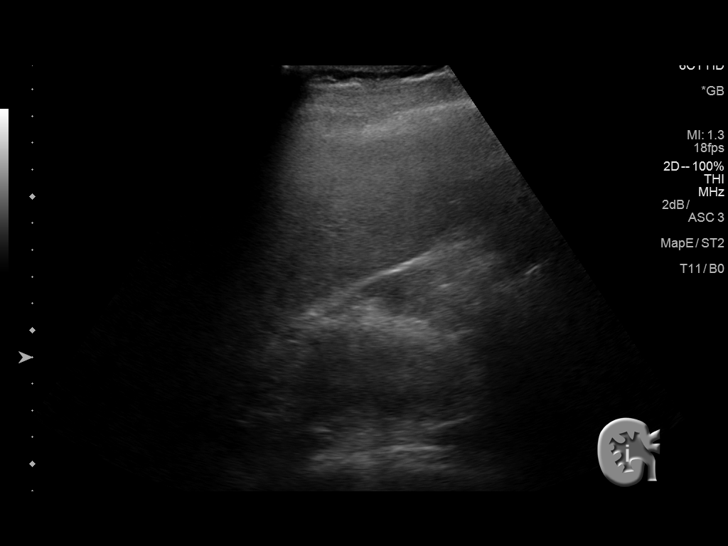
[im 90/105]
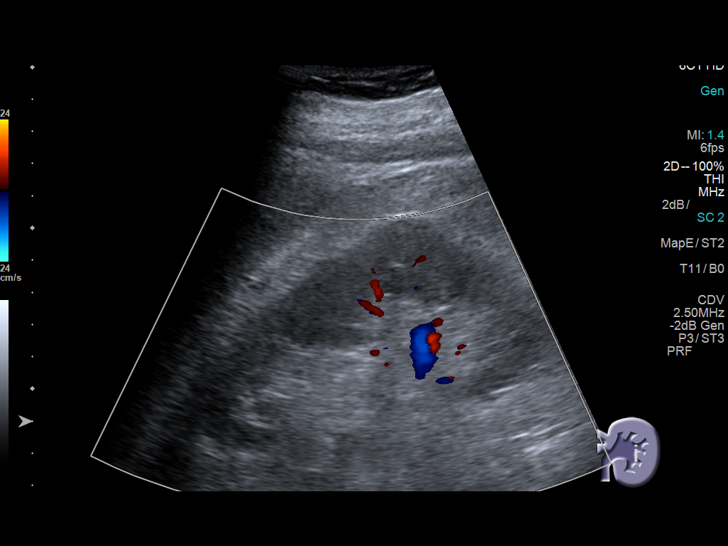
[im 100/105]
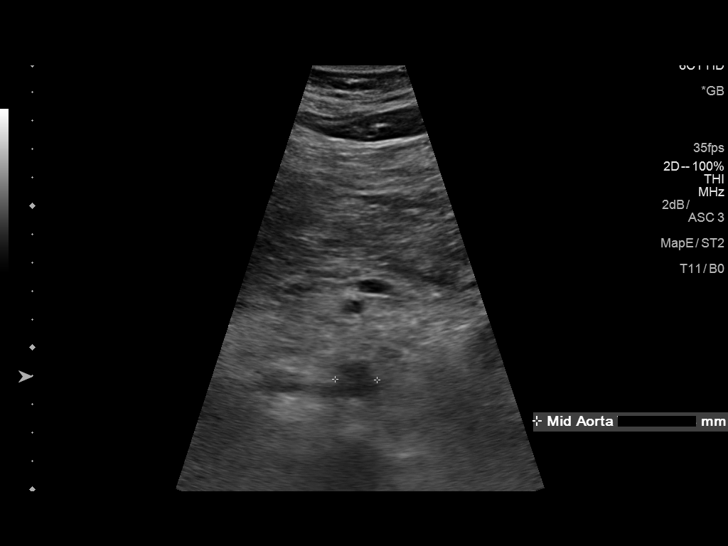

[Series 2001: us abdomen complete w/ elastography · 0.16mm/px · 2 of 16 slices shown (2 of 2)]
[im 1/16]
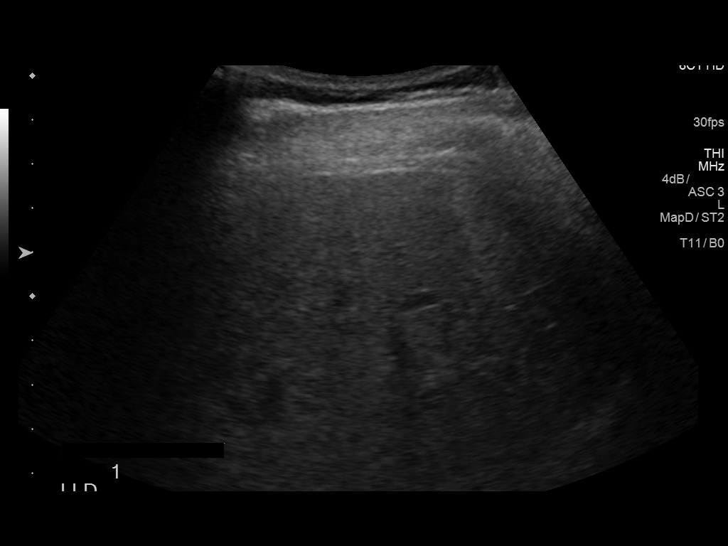
[im 16/16]
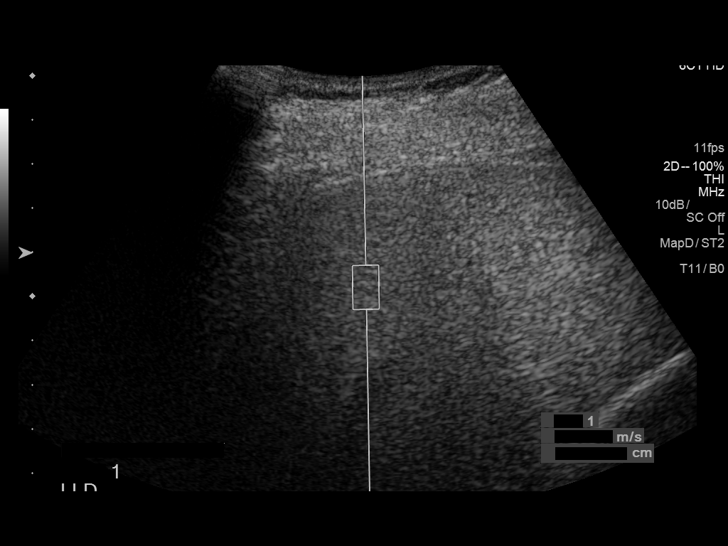

[13 of 25 positions shown; findings below may reference images not displayed]

FINDINGS: ULTRASOUND ABDOMEN

Gallbladder: Multiple gallstones are present, largest measuring
approximately 2.7 cm. Gallbladder wall is thickened, 4.7 mm. No
sonographic Murphy sign or pericholecystic fluid.

Common bile duct: Diameter: 4.7 mm

Liver: Liver is echogenic and coarse in echotexture. No focal liver
lesions are identified.

IVC: No abnormality visualized.

Pancreas: Limited views secondary bowel gas.

Spleen: 10.4 cm in length.  No focal lesions.

Right Kidney: Length: 10.0 cm. Echogenicity within normal limits. No
mass or hydronephrosis visualized.

Left Kidney: Length: 12.2 cm. Echogenicity within normal limits. No
mass or hydronephrosis visualized.

Abdominal aorta: No aneurysm visualized.

Other findings: None.

ULTRASOUND HEPATIC ELASTOGRAPHY

Device: Siemens Helix VTQ

Transducer 6 C1

Patient position: Left lateral decubitus

Number of measurements:  10

Hepatic Segment:  8

Median velocity:   1.39  m/sec

IQR:

IQR/Median velocity ratio

Corresponding Metavir fibrosis score:  F2 and some F3

Risk of fibrosis: Moderate

Limitations of exam: None

Pertinent findings noted on other imaging exams:  None

Please note that abnormal shear wave velocities may also be
identified in clinical settings other than with hepatic fibrosis,
such as: acute hepatitis, elevated right heart and central venous
pressures including use of beta blockers, Hue disease
([place]), infiltrative processes such as
mastocytosis/amyloidosis/infiltrative tumor, extrahepatic
cholestasis, in the post-prandial state, and liver transplantation.
Correlation with patient history, laboratory data, and clinical
condition recommended.
IMPRESSION: Numerous gallstones and nonspecific gallbladder wall thickening. No
other evidence for acute cholecystitis.

Echogenic liver without focal lesions.

Median hepatic shear wave velocity is calculated at 1.39 m/sec.

Corresponding Metavir fibrosis score is F2 and some F3.

Risk of fibrosis is moderate.

Follow-up:  Additional testing is appropriate.

## 2018-04-25 IMAGING — CT CT ANGIO CHEST
1 of 6 series · 5 of 36 positions shown · IV contrast (Omnipaque 300)
Comparison: 03/08/2015

CLINICAL DATA: Left chest pain. Esophageal cancer. Cough and fever.

EXAM:
CT ANGIOGRAPHY CHEST WITH CONTRAST
TECHNIQUE: Multidetector CT imaging of the chest was performed using the
standard protocol during bolus administration of intravenous
contrast. Multiplanar CT image reconstructions and MIPs were
obtained to evaluate the vascular anatomy.
CONTRAST:  Dose currently not available, reference EMR.

[Series 4: pe 3.0 b40f · axial · 0.64mm/px · z∈[-98,+62]mm · 5 of 81 slices shown]
[im 14/81  lung]
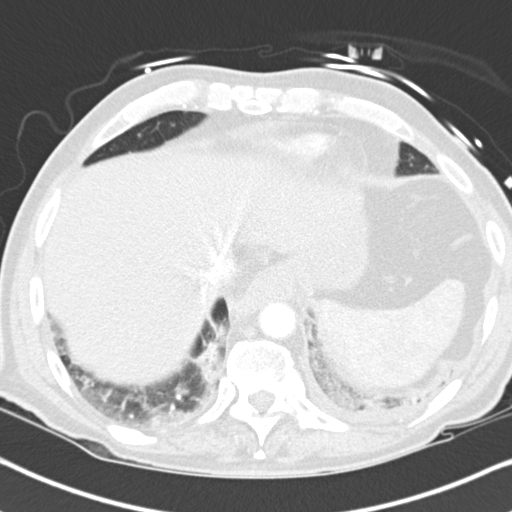
[im 27/81  mediastinal]
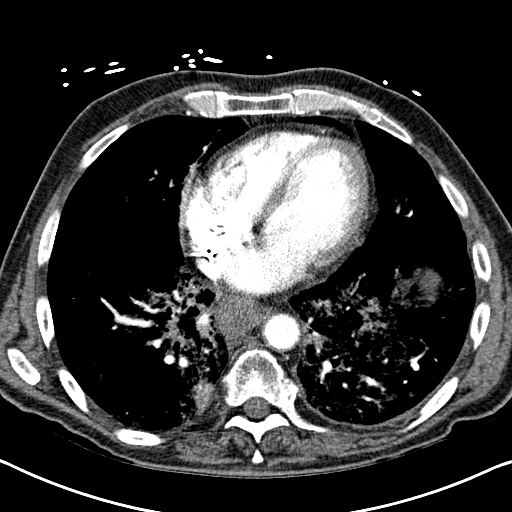
[im 41/81  lung]
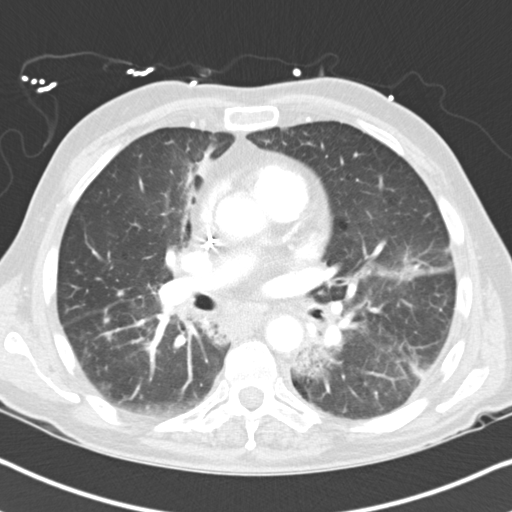
[im 54/81  mediastinal]
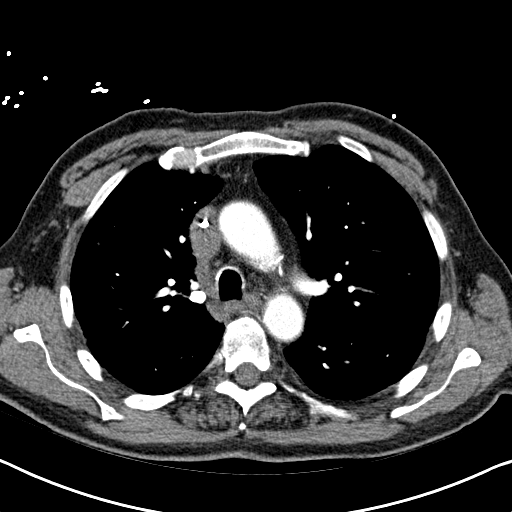
[im 67/81  lung]
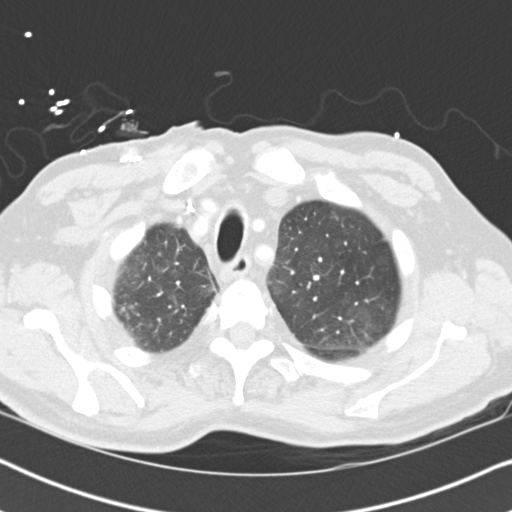

[5 of 36 positions shown; findings below may reference images not displayed]

FINDINGS: THORACIC INLET/BODY WALL:

Right-sided porta catheter with tip at the upper right atrium.

MEDIASTINUM:

Normal heart size. No pericardial effusion. No acute vascular
abnormality, including pulmonary embolism. Aortic atherosclerosis
without acute finding.

Lower esophageal thickening correlating with known mass. No
definitive malignant adenopathy. Evaluation of mediastinal lymph
nodes is limited in the setting of pulmonary infection.

LUNG WINDOWS:

Airspace disease in the lower lobes and less extensive in the
lingula and right middle lobe. There is patchy ground-glass opacity
in the apical lungs. Scattered small nodules, most suspicious is a
rounded 1 cm nodule in the right upper lobe [DATE].

UPPER ABDOMEN:

Subtle low-density in the posterior right liver which is not in the
same location as suspected metastasis on 04/30/2015 MRI.

OSSEOUS:

No acute fracture.  No suspicious lytic or blastic lesions.

Review of the MIP images confirms the above findings.
IMPRESSION: 1. Patchy bilateral pneumonia.
2. Negative for pulmonary embolism.
3. Scattered pulmonary nodules, up to 1 cm in the right upper lobe,
could be infectious but needs chest CT after treatment to exclude
new pulmonary metastases. At the same time, recommend updated
abdominal imaging as there is suggestion of a new liver lesion.
